# Patient Record
Sex: Female | Born: 1940
Health system: Southern US, Community
[De-identification: ages and names within clinical notes are randomized; demographics above are authoritative.]

## PROBLEM LIST (undated history)

## (undated) DIAGNOSIS — C801 Malignant (primary) neoplasm, unspecified: Secondary | ICD-10-CM

## (undated) DIAGNOSIS — Z91148 Patient's other noncompliance with medication regimen for other reason: Secondary | ICD-10-CM

## (undated) DIAGNOSIS — I209 Angina pectoris, unspecified: Secondary | ICD-10-CM

## (undated) DIAGNOSIS — H269 Unspecified cataract: Secondary | ICD-10-CM

## (undated) DIAGNOSIS — F32A Depression, unspecified: Secondary | ICD-10-CM

## (undated) DIAGNOSIS — J1282 Pneumonia due to coronavirus disease 2019: Secondary | ICD-10-CM

## (undated) DIAGNOSIS — F329 Major depressive disorder, single episode, unspecified: Secondary | ICD-10-CM

## (undated) DIAGNOSIS — R3129 Other microscopic hematuria: Secondary | ICD-10-CM

## (undated) DIAGNOSIS — U071 COVID-19: Secondary | ICD-10-CM

## (undated) DIAGNOSIS — T8859XA Other complications of anesthesia, initial encounter: Secondary | ICD-10-CM

## (undated) DIAGNOSIS — E559 Vitamin D deficiency, unspecified: Secondary | ICD-10-CM

## (undated) DIAGNOSIS — F419 Anxiety disorder, unspecified: Secondary | ICD-10-CM

## (undated) DIAGNOSIS — I219 Acute myocardial infarction, unspecified: Secondary | ICD-10-CM

## (undated) DIAGNOSIS — R011 Cardiac murmur, unspecified: Secondary | ICD-10-CM

## (undated) DIAGNOSIS — E785 Hyperlipidemia, unspecified: Secondary | ICD-10-CM

## (undated) DIAGNOSIS — I10 Essential (primary) hypertension: Secondary | ICD-10-CM

## (undated) DIAGNOSIS — R7302 Impaired glucose tolerance (oral): Secondary | ICD-10-CM

## (undated) DIAGNOSIS — Z9114 Patient's other noncompliance with medication regimen: Secondary | ICD-10-CM

## (undated) DIAGNOSIS — Z9289 Personal history of other medical treatment: Secondary | ICD-10-CM

## (undated) DIAGNOSIS — C50919 Malignant neoplasm of unspecified site of unspecified female breast: Secondary | ICD-10-CM

## (undated) DIAGNOSIS — I2510 Atherosclerotic heart disease of native coronary artery without angina pectoris: Secondary | ICD-10-CM

## (undated) DIAGNOSIS — T4145XA Adverse effect of unspecified anesthetic, initial encounter: Secondary | ICD-10-CM

## (undated) DIAGNOSIS — R000 Tachycardia, unspecified: Secondary | ICD-10-CM

## (undated) DIAGNOSIS — L409 Psoriasis, unspecified: Secondary | ICD-10-CM

## (undated) HISTORY — DX: Hyperlipidemia, unspecified: E78.5

## (undated) HISTORY — DX: Major depressive disorder, single episode, unspecified: F32.9

## (undated) HISTORY — DX: Patient's other noncompliance with medication regimen for other reason: Z91.148

## (undated) HISTORY — PX: APPENDECTOMY: SHX54

## (undated) HISTORY — DX: Essential (primary) hypertension: I10

## (undated) HISTORY — DX: Patient's other noncompliance with medication regimen: Z91.14

## (undated) HISTORY — DX: Tachycardia, unspecified: R00.0

## (undated) HISTORY — DX: Malignant neoplasm of unspecified site of unspecified female breast: C50.919

## (undated) HISTORY — DX: Vitamin D deficiency, unspecified: E55.9

## (undated) HISTORY — PX: BREAST BIOPSY: SHX20

## (undated) HISTORY — DX: Anxiety disorder, unspecified: F41.9

## (undated) HISTORY — PX: OTHER SURGICAL HISTORY: SHX169

## (undated) HISTORY — PX: CORONARY STENT PLACEMENT: SHX1402

## (undated) HISTORY — DX: Acute myocardial infarction, unspecified: I21.9

## (undated) HISTORY — DX: Unspecified cataract: H26.9

## (undated) HISTORY — DX: Personal history of other medical treatment: Z92.89

## (undated) HISTORY — DX: Atherosclerotic heart disease of native coronary artery without angina pectoris: I25.10

## (undated) HISTORY — PX: EYE SURGERY: SHX253

## (undated) HISTORY — DX: Psoriasis, unspecified: L40.9

## (undated) HISTORY — DX: Impaired glucose tolerance (oral): R73.02

## (undated) HISTORY — DX: Other microscopic hematuria: R31.29

## (undated) HISTORY — PX: ABDOMINAL HYSTERECTOMY: SHX81

## (undated) HISTORY — DX: Depression, unspecified: F32.A

## (undated) HISTORY — DX: Cardiac murmur, unspecified: R01.1

## (undated) HISTORY — PX: HEMORRHOID SURGERY: SHX153

## (undated) HISTORY — PX: TONSILLECTOMY AND ADENOIDECTOMY: SUR1326

---

## 1974-11-29 HISTORY — PX: RECTAL PROLAPSE REPAIR: SHX759

## 1998-05-14 ENCOUNTER — Other Ambulatory Visit: Admission: RE | Admit: 1998-05-14 | Discharge: 1998-05-14 | Payer: Self-pay | Admitting: *Deleted

## 1999-05-04 ENCOUNTER — Other Ambulatory Visit: Admission: RE | Admit: 1999-05-04 | Discharge: 1999-05-04 | Payer: Self-pay | Admitting: *Deleted

## 2000-11-25 ENCOUNTER — Other Ambulatory Visit: Admission: RE | Admit: 2000-11-25 | Discharge: 2000-11-25 | Payer: Self-pay | Admitting: *Deleted

## 2004-02-19 ENCOUNTER — Ambulatory Visit (HOSPITAL_COMMUNITY): Admission: RE | Admit: 2004-02-19 | Discharge: 2004-02-19 | Payer: Self-pay | Admitting: *Deleted

## 2005-03-06 ENCOUNTER — Ambulatory Visit: Payer: Self-pay | Admitting: Internal Medicine

## 2005-03-07 ENCOUNTER — Inpatient Hospital Stay (HOSPITAL_COMMUNITY): Admission: EM | Admit: 2005-03-07 | Discharge: 2005-03-09 | Payer: Self-pay | Admitting: Emergency Medicine

## 2005-03-16 ENCOUNTER — Ambulatory Visit: Payer: Self-pay | Admitting: Internal Medicine

## 2005-03-29 ENCOUNTER — Encounter (HOSPITAL_COMMUNITY): Admission: RE | Admit: 2005-03-29 | Discharge: 2005-06-27 | Payer: Self-pay | Admitting: Internal Medicine

## 2005-04-20 ENCOUNTER — Ambulatory Visit: Payer: Self-pay | Admitting: Internal Medicine

## 2005-05-03 ENCOUNTER — Ambulatory Visit: Payer: Self-pay | Admitting: Internal Medicine

## 2005-05-11 ENCOUNTER — Ambulatory Visit: Payer: Self-pay | Admitting: Internal Medicine

## 2005-06-28 ENCOUNTER — Encounter (HOSPITAL_COMMUNITY): Admission: RE | Admit: 2005-06-28 | Discharge: 2005-09-26 | Payer: Self-pay | Admitting: Internal Medicine

## 2005-08-17 ENCOUNTER — Ambulatory Visit: Payer: Self-pay | Admitting: Internal Medicine

## 2006-02-14 ENCOUNTER — Ambulatory Visit: Payer: Self-pay | Admitting: Internal Medicine

## 2006-02-21 ENCOUNTER — Ambulatory Visit: Payer: Self-pay | Admitting: Internal Medicine

## 2006-04-12 ENCOUNTER — Ambulatory Visit: Payer: Self-pay | Admitting: Internal Medicine

## 2006-04-18 ENCOUNTER — Ambulatory Visit: Payer: Self-pay | Admitting: Internal Medicine

## 2006-10-04 ENCOUNTER — Ambulatory Visit: Payer: Self-pay | Admitting: Internal Medicine

## 2007-03-30 ENCOUNTER — Other Ambulatory Visit: Admission: RE | Admit: 2007-03-30 | Discharge: 2007-03-30 | Payer: Self-pay | Admitting: Obstetrics & Gynecology

## 2007-05-26 ENCOUNTER — Ambulatory Visit: Payer: Self-pay | Admitting: Internal Medicine

## 2007-06-01 ENCOUNTER — Ambulatory Visit: Payer: Self-pay | Admitting: Internal Medicine

## 2007-06-01 LAB — CONVERTED CEMR LAB
AST: 17 units/L (ref 0–37)
Albumin: 3.7 g/dL (ref 3.5–5.2)
Alkaline Phosphatase: 57 units/L (ref 39–117)
Bilirubin, Direct: 0.1 mg/dL (ref 0.0–0.3)
Calcium: 9.7 mg/dL (ref 8.4–10.5)
Chloride: 105 meq/L (ref 96–112)
GFR calc non Af Amer: 66 mL/min
LDL Cholesterol: 83 mg/dL (ref 0–99)
Total CHOL/HDL Ratio: 2.4
Total Protein: 7 g/dL (ref 6.0–8.3)
Triglycerides: 48 mg/dL (ref 0–149)

## 2007-07-28 ENCOUNTER — Ambulatory Visit: Payer: Self-pay | Admitting: Internal Medicine

## 2007-09-08 ENCOUNTER — Ambulatory Visit: Payer: Self-pay | Admitting: Internal Medicine

## 2007-09-08 LAB — CONVERTED CEMR LAB
ALT: 26 units/L (ref 0–35)
AST: 21 units/L (ref 0–37)
Alkaline Phosphatase: 62 units/L (ref 39–117)
Bilirubin, Direct: 0.1 mg/dL (ref 0.0–0.3)
CO2: 32 meq/L (ref 19–32)
CRP, High Sensitivity: 1 (ref 0.00–5.00)
Calcium: 9.5 mg/dL (ref 8.4–10.5)
Cholesterol: 201 mg/dL (ref 0–200)
GFR calc Af Amer: 92 mL/min
Potassium: 3.7 meq/L (ref 3.5–5.1)
Total CHOL/HDL Ratio: 3.1
Total Protein: 7 g/dL (ref 6.0–8.3)
Triglycerides: 75 mg/dL (ref 0–149)

## 2008-01-17 ENCOUNTER — Ambulatory Visit: Payer: Self-pay | Admitting: Internal Medicine

## 2008-01-19 ENCOUNTER — Ambulatory Visit: Payer: Self-pay | Admitting: Internal Medicine

## 2008-01-19 LAB — CONVERTED CEMR LAB
ALT: 19 units/L (ref 0–35)
AST: 14 units/L (ref 0–37)
Alkaline Phosphatase: 60 units/L (ref 39–117)
CO2: 32 meq/L (ref 19–32)
Chloride: 104 meq/L (ref 96–112)
Creatinine, Ser: 1 mg/dL (ref 0.4–1.2)
Potassium: 3.7 meq/L (ref 3.5–5.1)
Sodium: 141 meq/L (ref 135–145)
Total Protein: 7.1 g/dL (ref 6.0–8.3)
VLDL: 14 mg/dL (ref 0–40)

## 2008-09-10 ENCOUNTER — Ambulatory Visit: Payer: Self-pay | Admitting: Internal Medicine

## 2008-09-11 ENCOUNTER — Ambulatory Visit: Payer: Self-pay

## 2008-11-12 ENCOUNTER — Ambulatory Visit: Payer: Self-pay | Admitting: Internal Medicine

## 2009-02-09 DIAGNOSIS — I2510 Atherosclerotic heart disease of native coronary artery without angina pectoris: Secondary | ICD-10-CM | POA: Insufficient documentation

## 2009-02-09 DIAGNOSIS — I10 Essential (primary) hypertension: Secondary | ICD-10-CM | POA: Insufficient documentation

## 2009-02-09 DIAGNOSIS — E785 Hyperlipidemia, unspecified: Secondary | ICD-10-CM | POA: Insufficient documentation

## 2009-02-09 DIAGNOSIS — I119 Hypertensive heart disease without heart failure: Secondary | ICD-10-CM | POA: Insufficient documentation

## 2009-02-10 ENCOUNTER — Encounter (INDEPENDENT_AMBULATORY_CARE_PROVIDER_SITE_OTHER): Payer: Self-pay | Admitting: *Deleted

## 2009-02-11 ENCOUNTER — Encounter: Payer: Self-pay | Admitting: Internal Medicine

## 2009-02-11 ENCOUNTER — Ambulatory Visit: Payer: Self-pay | Admitting: Internal Medicine

## 2009-02-11 DIAGNOSIS — M25519 Pain in unspecified shoulder: Secondary | ICD-10-CM | POA: Insufficient documentation

## 2009-02-13 ENCOUNTER — Ambulatory Visit: Payer: Self-pay

## 2009-05-15 ENCOUNTER — Ambulatory Visit: Payer: Self-pay | Admitting: Internal Medicine

## 2009-06-04 ENCOUNTER — Ambulatory Visit: Payer: Self-pay | Admitting: Internal Medicine

## 2009-06-05 LAB — CONVERTED CEMR LAB
ALT: 21 units/L (ref 0–35)
AST: 18 units/L (ref 0–37)
Alkaline Phosphatase: 59 units/L (ref 39–117)
Bilirubin, Direct: 0.1 mg/dL (ref 0.0–0.3)
Cholesterol: 166 mg/dL (ref 0–200)
HDL: 68.7 mg/dL (ref >39.00)
LDL Cholesterol: 89 mg/dL (ref 0–99)
Sodium: 142 meq/L (ref 135–145)
Total Bilirubin: 0.8 mg/dL (ref 0.3–1.2)
Total CHOL/HDL Ratio: 2
Total Protein: 6.5 g/dL (ref 6.0–8.3)
VLDL: 8.6 mg/dL (ref 0.0–40.0)

## 2009-11-06 ENCOUNTER — Ambulatory Visit: Payer: Self-pay | Admitting: Internal Medicine

## 2010-05-01 ENCOUNTER — Ambulatory Visit: Payer: Self-pay | Admitting: Internal Medicine

## 2010-09-17 ENCOUNTER — Encounter: Payer: Self-pay | Admitting: Physician Assistant

## 2010-09-27 ENCOUNTER — Encounter: Payer: Self-pay | Admitting: Physician Assistant

## 2010-09-28 ENCOUNTER — Ambulatory Visit: Payer: Self-pay | Admitting: Physician Assistant

## 2010-12-28 ENCOUNTER — Ambulatory Visit: Admit: 2010-12-28 | Payer: Self-pay | Admitting: Internal Medicine

## 2010-12-29 NOTE — Assessment & Plan Note (Signed)
Summary: Z6X  Medications Added AMLODIPINE BESYLATE 5 MG  TABS (AMLODIPINE BESYLATE) 1/2 by mouth every day RED YEAST RICE 600 MG CAPS (RED YEAST RICE EXTRACT) 2 tablets once daily      Allergies Added: NKDA  Visit Type:  Follow-up Primary Erika Brown:  Erika Brown  CC:  BP to low?[name]  History of Present Illness: Erika Brown is a very pleasant 70 year old woman with history of coronary artery disease, hypertension, hyperlipidemia with intolerance to all cholesterol medications and anxiety. She did had a Myoview in Oct 2009 which showed a small area of reversible ischemia in the inferior wall but we've been treating this medically.  Since her last visit with Dr. Gala Romney in June, she has developed fatigue.  She brings in a list of her BPs.  Her readings are in the 90s systolically at times.  She feels poorly with this.  Her PCP decreased her amlodipine recently at her CPE.  She feels some better with this but the change was done just one week ago.  Her LDL was 100 on her blood work.  She had a normal CBC, BMET and LFTs.  She  denies chest pain, dyspnea, orthopnea, PND, pedal edema or syncope.  The patient describes NYHA Class1-2 symptoms.  Current Medications (verified): 1)  Amlodipine Besylate 5 Mg  Tabs (Amlodipine Besylate) .... 1/2 By Mouth Every Day 2)  Metoprolol Succinate 50 Mg Xr24h-Tab (Metoprolol Succinate) .[name].. 1 Once Daily 3)  Diovan Hct 320-25 Mg Tabs (Valsartan-Hydrochlorothiazide) .... Take 1 Tab Daily 4)  Potassium Chloride Crys Cr 20 Meq Cr-Tabs (Potassium Chloride Crys Cr) .... Take One Tablet By Mouth Daily 5)  Plavix 75 Mg Tabs (Clopidogrel Bisulfate) .... Take 1 Tab By Mouth Daily 6)  Aspirin 81 Mg  Tbec (Aspirin) .... One By Mouth Every Day 7)  Vitamin D 09604 Unit  Caps (Ergocalciferol) .[name].. 1 By Mouth Weekly 8)  Nitrostat 0.4 Mg Subl (Nitroglycerin) .[name].. 1 Tablet Under Tongue At Onset of Chest Pain; You May Repeat Every 5 Minutes For Up To 3 Doses. 9)  Proctozone-Hc 2.5 % Crea  (Hydrocortisone) .... Prn 10)  Red Yeast Rice 600 Mg Caps (Red Yeast Rice Extract) .... 2 Tablets Once Daily  Allergies (verified): No Known Drug Allergies  Past History:  Past Medical History: Reviewed history from 02/09/2009 and no changes required.  1.  CAD     a. s/p NSTEMI 4/06. EF 60%     b. treated with Cypher DES to LAD and RCA with kissing balloon PCI diagonal     c. Treadmill Myoview 10/09 for CP: Walked 7:06. mild ST-T-wave changes in recovery.           EF 71%.  Very mild reversible defect in base of the inferior wall. -> medical rx  2. Hypertension with possible white-coat component  3. Hyperlipidemia     a. inolerant to multiple statins due to severe myalgias  4. Anxiety/depression  5. Glucose intolerance  6. Medication noncompliance  7. Seafood/shellfish allergy       Review of Systems       As per  the HPI.  All other systems reviewed and negative.   Vital Signs:  Patient profile:   70 year old female Height:      65 inches Weight:      146 pounds BMI:     24.38 Pulse rate:   79 / minute BP sitting:   135 / 80  (right arm) Cuff size:   regular  Vitals Entered By: Roanna Raider  Bascom Levels, RMA (September 28, 2010 3:51 PM)  Serial Vital Signs/Assessments:  Time      Position  BP       Pulse  Resp  Temp     By 4:53 PM             126/82                         Tereso Newcomer PA-C                                              Physical Exam  General:  Well nourished, well developed, in no acute distress HEENT: normal Neck: no JVD Cardiac:  normal S1, S2; RRR; no murmur Lungs:  clear to auscultation bilaterally, no wheezing, rhonchi or rales Abd: soft, nontender, no hepatomegaly Ext: no edema Vascular: no carotid  bruits Skin: warm and dry    Impression & Recommendations:  Problem # 1:  CAD, NATIVE VESSEL (ICD-414.01) No symptoms of angina. Continue current regimen. Follow up in 6 mos.  Problem # 2:  HYPERTENSION, BENIGN (ICD-401.1) Controlled.  Her BP  by me is optimal.  She was symptomatic with lower BPs and feels better with her new medical regimen.  Problem # 3:  HYPERLIPIDEMIA-MIXED (ICD-272.4) Intolerant to multiple statins.  Using red yeast rice.  She will have a f/u FLP and LFTs in 3 mos.  Patient Instructions: 1)  Your physician recommends that you schedule a follow-up appointment in: 6 months with Dr. Gala Romney.The office will mail you a reminder letter 2 months prior appointment date. 2)  Your physician recommends that you return for a FASTING lipid profile: Fasting lipid panel and LFT in 3 months. Pt. has an appointment for labs on 12/28/10. Pt. aware.

## 2010-12-29 NOTE — Progress Notes (Signed)
Summary: [facility]als   Patients at Home Vitals   Imported By: Roderic Ovens 10/05/2010 11:48:52  _____________________________________________________________________  External Attachment:    Type:   Image     Comment:   External Document

## 2010-12-29 NOTE — Assessment & Plan Note (Signed)
Summary: f49m      Allergies Added: NKDA   Primary Provider:  [name]   History of Present Illness: Erika Brown is a very pleasant 70 year old woman with history of coronary artery disease, hypertension, hyperlipidemia with intolerance to all cholesterol medications and anxiety. She did had a Myoview recently which showed a small area of reversible ischemia in the inferior wall but we've been treating this medically.  She returns today for followup. Doing great! Playing tennis avidly. Very good exercise tolerance. endurance much improved. No CP or SOB. BP well controlled. Taking red yeast rice most days.   Current Medications (verified): 1)  Amlodipine Besylate 5 Mg  Tabs (Amlodipine Besylate) .[name].. 1 By Mouth Every Day 2)  Metoprolol Succinate 50 Mg Xr24h-Tab (Metoprolol Succinate) .[name].. 1 Once Daily 3)  Diovan Hct 320-25 Mg Tabs (Valsartan-Hydrochlorothiazide) .... Take 1 Tab Daily 4)  Potassium Chloride Crys Cr 20 Meq Cr-Tabs (Potassium Chloride Crys Cr) .... Take One Tablet By Mouth Daily 5)  Plavix 75 Mg Tabs (Clopidogrel Bisulfate) .... Take 1 Tab By Mouth Daily 6)  Aspirin 81 Mg  Tbec (Aspirin) .... One By Mouth Every Day 7)  Vitamin D 13086 Unit  Caps (Ergocalciferol) .[name].. 1 By Mouth Weekly 8)  Nitrostat 0.4 Mg Subl (Nitroglycerin) .[name].. 1 Tablet Under Tongue At Onset of Chest Pain; You May Repeat Every 5 Minutes For Up To 3 Doses. 9)  Proctozone-Hc 2.5 % Crea (Hydrocortisone) .... Prn 10)  Red Yeast Rice 600 Mg Caps (Red Yeast Rice Extract) .... 3 Tablets Once Daily (Out Getting More)  Allergies (verified): No Known Drug Allergies  Past History:  Past Medical History: Last updated: 02/09/2009  1.  CAD     a. s/p NSTEMI 4/06. EF 60%     b. treated with Cypher DES to LAD and RCA with kissing balloon PCI diagonal     c. Treadmill Myoview 10/09 for CP: Walked 7:06. mild ST-T-wave changes in recovery.           EF 71%.  Very mild reversible defect in base of the inferior wall. ->  medical rx  2. Hypertension with possible white-coat component  3. Hyperlipidemia     a. inolerant to multiple statins due to severe myalgias  4. Anxiety/depression  5. Glucose intolerance  6. Medication noncompliance  7. Seafood/shellfish allergy       Review of Systems       As per HPI and past medical history; otherwise all systems negative.   Vital Signs:  Patient profile:   70 year old female Height:      65 inches Weight:      150 pounds BMI:     25.05 Pulse rate:   74 / minute Resp:     16 per minute BP sitting:   128 / 78  (right arm)  Vitals Entered By: Marrion Coy, CNA (May 01, 2010 4:16 PM)  Physical Exam  General:  Gen: well appearing. looks younger than stated age no resp difficulty HEENT: normal Neck: supple. no JVD. Carotids 2+ bilat; no bruits.  Cor: PMI nondisplaced. Regular rate & rhythm. No rubs, gallops, murmur. Lungs: clear Abdomen: soft, nontender, nondistended. No hepatosplenomegaly. No bruits or masses. Good bowel sounds. Extremities: no cyanosis, clubbing, rash, edema.  Neuro: alert & orientedx3, cranial nerves grossly intact. moves all 4 extremities w/o difficulty. affect pleasant    Impression & Recommendations:  Problem # 1:  CAD, NATIVE VESSEL (ICD-414.01) Stable. No evidence of ischemia. She is doing great with  her tennis. Continue current regimen.  Problem # 2:  HYPERTENSION, BENIGN (ICD-401.1) Blood pressure looks great!  Continue current regimen.  Other Orders: EKG w/ Interpretation (93000)  Patient Instructions: 1)  Your physician wants you to follow-up in:  6 months.  You will receive a reminder letter in the mail two months in advance. If you don't receive a letter, please call our office to schedule the follow-up appointment.

## 2011-04-13 NOTE — Assessment & Plan Note (Signed)
[facility]                            CARDIOLOGY OFFICE NOTE   MIARA, EMMINGER                      MRN:          [id number]  DATE:07/28/2007                            DOB:          1941-03-07    PRIMARY CARE PHYSICIAN:  Geoffry Paradise, M.D.   INTERVAL HISTORY:  Erika Brown is a very pleasant 70 year old woman with a  history of coronary artery disease status post previous non-ST-elevation  myocardial infarction in April 2007, treated with PTCA and stenting of  the proximal right coronary artery and mid LAD with CYPHER drug-eluting  stents.  EF is 50%.  She also has a history of hypertension,  hyperlipidemia, and borderline glucose intolerance.   She returns today for routine followup.  Overall, she is doing fairly  well, though she is not exercising like she used to.  She denies any  chest pain or shortness of breath.  She has stopped her Lipitor because  she said her cholesterol looks okay and she is worried about side  effects.  She has been doing her best to control her lipids by diet  alone.   CURRENT MEDICATIONS:  1. Aspirin 81.  2. Plavix 75.  3. Potassium 10 a day.  4. Diovan/hydrochlorothiazide 320/25.  5. Toprol 50 a day.  6. Amlodipine 5 a day.   PHYSICAL EXAM:  She is well-appearing, in no acute distress.  Ambulates  around the clinic without any respiratory difficulty.  Blood pressure here is 138/82, though she brings in a log of her blood  pressures and systolics have been running in about the 110-120 range.  Heart rate is 65, weight is 151.  HEENT:  Normal, except for some mild xanthelasma.  NECK:  Supple.  No JVD.  Carotids are 2+ bilaterally without any bruits.  There is no lymphadenopathy or thyromegaly.  CARDIAC:  Regular rate and rhythm.  No murmurs, rubs, or gallops.  PMI  is not displaced.  LUNGS:  Clear.  ABDOMEN:  Soft, nontender, nondistended.  No hepatosplenomegaly.  No  bruits.  No masses.  Good bowel  sounds.  EXTREMITIES:  Warm with no cyanosis, clubbing, or edema.  No rash.  NEURO:  Alert and oriented x3.  Cranial nerves 2-12 are intact.  Moves  all 4 extremities without difficulty.  Affect is pleasant.  Most recent  cholesterol panel shows a total cholesterol 158, triglycerides 48, HDL  66, and LDL 83.   ASSESSMENT AND PLAN:  1. Coronary artery disease status post previous myocardial infarction      with drug-eluting stents.  She is stable.  Continue Plavix.  No      evidence of ischemia.  I did remind her to exercise more.  2. Hypertension.  She has a significant component of white coat      hypertension, but her blood pressure at home is ding quite well and      we will continue her current regimen.  3. Hyperlipidemia.  We had a very long talk about needing to get her      LDL down to below 70, and also the  additional benefits of a Statin.      At this point, she remains reluctant to start a Statin.  I did      advise her of the risk reduction potential and left the decision to      her.  We will get a CRT as well as a repeat lipid panel in several      months to see how she is doing and get an idea of her overall risk.   DISPOSITION:  Return to clinic in 6 months for a routine followup.     Bevelyn Buckles. Bensimhon, MD  Electronically Signed    DRB/MedQ  DD: 07/28/2007  DT: 07/29/2007  Job #: [id number]   cc:   Geoffry Paradise, M.D.

## 2011-04-13 NOTE — Assessment & Plan Note (Signed)
[facility]                            CARDIOLOGY OFFICE NOTE   Erika Brown, Erika Brown                      MRN:          [id number]  DATE:09/10/2008                            DOB:          05-25-1941    PRIMARY CARE PHYSICIAN:  Geoffry Paradise, MD   INTERVAL HISTORY:  Isip is a very pleasant 70 year old woman with a  history of coronary artery disease status post non-ST-elevation  myocardial infarction April 2007, treated with stenting of the proximal  right coronary artery and mid LAD with Cypher drug-eluting stent.  EF  was 50%.  She also has a history of hypertension, hyperlipidemia,  borderline glucose intolerance, and anxiety.  She has been intolerant of  statins before due to severe muscle aches.  She also has a history of  medication noncompliance.   She returns today at the request of Dr. [name].  He saw her 2 days ago  and she describes several episodes of chest tightness with activity  including playing singles tennis and walking uphill with her daughter.  She does not get this discomfort with low levels of activity.  The  discomfort resolves quickly when she sits down to rest.  She has not had  any nighttime angina.  No heart failure symptoms.   Given her intolerance of medications, she is switched over to taking all  her medications every other day.  She has been doing this for the past  year.  Dr. [name] saw her and is very concerned about her chest pain.  I asked her to follow up with [place].  He also went ahead and started her on  Crestor 5 mg every other day to see if she would tolerate.   She is quite anxious in the office today.  She has been taking her blood  pressures at home and these have been relatively well-controlled with  systolics in the 120 range.  She does have a history of white coat  hypertension.   CURRENT MEDICATIONS:  1. Crestor 5 mg every other day.  2. Aspirin 81 every other day.  3. Plavix 75 daily.  4.  Potassium 10 daily.  5. Diovan/HCTZ 325/25 daily.  6. Toprol 50 mg daily.  7. Amlodipine 5 mg daily.   PHYSICAL EXAMINATION:  GENERAL:  She is mildly anxious and in no acute  distress, ambulates around the clinic without any respiratory  difficulty.  VITAL SIGNS:  Blood pressure initially was 160/90, on my recheck it was  174/94, heart rate was 74, weight was 149 which is stable.  HEENT:  Normal.  NECK:  Supple.  There is no JVD.  Carotids are 2+ bilaterally without  bruits.  There is no lymphadenopathy or thyromegaly.  CARDIAC:  PMI is  nondisplaced.  She has regular rate and rhythm with no murmurs, rubs or  gallops.  LUNGS:  Clear.  ABDOMEN:  Soft, nontender, nondistended, no hepatosplenomegaly, no  bruits, no masses.  Good bowel sounds.  EXTREMITIES:  Warm with no cyanosis, clubbing, or edema.  No rash.  NEURO:  Alert and oriented x3, cranial nerves II through XII  are intact,  moves all 4 extremities difficulty.   EKG shows normal sinus rhythm at a rate of 74.  She has small inferior Q-  waves which are new since her previous EKG last year.  She has no  significant ST-T wave abnormalities.   ASSESSMENT AND PLAN:  1. Chest pain.  This is very concerning to me for her chronic angina.      It has not been progressive.  Her EKG has changed and looks like      she may have suffered a silent inferior myocardial infarction.  We      discussed the risk and indication of cardiac catheterization and      stress testing.  At this point, she has chosen stress testing.  We      will proceed with a treadmill Myoview.  I would have a fairly low      threshold to proceed with catheterization if there is a significant      amount of ischemia.  I did discuss with her the possibility of      adding Imdur, but she does not want to do this at this point.  I      also talked to her about trying to be more compliant with her      medications on a daily basis.  2. Chronic hypertension.  I suspect  this is mostly white coat      hypertension followed by Dr. [name].  3. Hyperlipidemia.  Goal LDL is less than 70.  She is recently been      started on a statin,  I  hope she will be able to tolerate this.   DISPOSITION:  Pending results of her stress test.     Bevelyn Buckles. Bensimhon, MD  Electronically Signed    DRB/MedQ  DD: 09/10/2008  DT: 09/11/2008  Job #: [id number]   cc:   Geoffry Paradise, M.D.

## 2011-04-13 NOTE — Assessment & Plan Note (Signed)
[facility]                            CARDIOLOGY OFFICE NOTE   Erika Brown, Erika Brown                      MRN:          [id number]  DATE:11/12/2008                            DOB:          Jun 16, 1941    PRIMARY CARE PHYSICIAN:  Geoffry Paradise, MD   INTERVAL HISTORY:  Erika Brown is a very pleasant 70 year old woman with  history of coronary artery disease status post non-ST-elevation  myocardial infarction in April 2007 treated with stenting of the  proximal right coronary artery in the LAD with Cypher drug-eluting  stents.  EF was 50%.  She also has a history of hypertension,  hyperlipidemia, borderline glucose intolerance, and anxiety.  She has  been intolerant of multiple statins due to severe muscle aches.  She  also has a history of medication noncompliance.   The last visit, she had some exertional chest pain.  We discussed the  possibility catheterization versus Myoview.  She underwent a treadmill  Myoview.  She walked 7 minutes and 6 seconds with mild ST-T-wave changes  in recovery.  EF was 71%.  There was a very mild reversible defect in  the base of the inferior wall.  We chose to manage her medically.   She has been playing tennis very avidly.  She has been active with no  chest pain or shortness of breath.  She says she feels great.  She is  very happy with her tennis.  She has been very compliant with her  medications as well.  She is back to taking them every day instead of  every other day.  She did try Crestor, but was unable to tolerate this  once again due to myalgias.   CURRENT MEDICATIONS:  1. Amlodipine 5 mg a day.  2. Metoprolol 50 mg a day.  3. Diovan hydrochlorothiazide 320/25 a day.  4. Potassium 10 a day.  5. Plavix 75 a day.  6. Aspirin 81 a day.  7. Vitamin D.   ALLERGIES/INTOLERANCES:  All STATIN DRUGS and IODINE.   PHYSICAL EXAMINATION:  GENERAL:  She is well-appearing in no acute  distress, ambulates around the  clinic without any respiratory  difficulty.  VITAL SIGNS:  Blood pressure is 122/76, heart rate is 90, weight is 149.  HEENT:  Normal.  NECK:  Supple.  No JVD.  Carotids are 2+ bilaterally without any bruits.  There is no lymphadenopathy or thyromegaly.  CARDIAC:  PMI is nondisplaced.  She has irregular rate and rhythm.  No  murmurs, rubs, or gallops.  LUNGS:  Clear.  ABDOMEN:  Soft, nontender, nondistended, no hepatosplenomegaly, no  bruits, no masses.  EXTREMITIES:  Warm with no cyanosis or clubbing.  She does have a  contusion on her right shin with some surrounding swelling.  Otherwise,  no edema.  NEURO:  Alert and oriented x3.  Cranial nerves II through XII are  intact.  Moves all 4 extremities without difficulty.  Affect is very  pleasant.   Review of her home blood pressures shows good control with systolics  ranging from 114-140 with the average in low-to-mid 120s.  ASSESSMENT AND PLAN:  1. Coronary artery disease with recent mildly positive stress test.      She has done great with medical therapy and exercise.  I would      continue this.  2. Hypertension is beautiful today.  I have congratulated her on her      compliance.  She is really doing well.  3. Hyperlipidemia.  She has been intolerant of all statins.  I      suggested her that she try red yeast rice and see how that goes.      She is going to try a 600-mg tablet and see if we can work all the      way up to 2 or 3 tablets a day.   DISPOSITION:  We will see her back in clinic in 4-5 months for routine  followup.     Bevelyn Buckles. Bensimhon, MD  Electronically Signed    DRB/MedQ  DD: 11/12/2008  DT: 11/13/2008  Job #: [id number]   cc:   Geoffry Paradise, M.D.

## 2011-04-13 NOTE — Assessment & Plan Note (Signed)
[facility]                            CARDIOLOGY OFFICE NOTE   Erika Brown, Erika Brown                      MRN:          [id number]  DATE:05/26/2007                            DOB:          Sep 22, 1941    REFERRING PHYSICIAN:  Geoffry Paradise, M.D.   INTERVAL HISTORY:  Erika Brown is a very pleasant 70 year old woman with a  history of coronary artery disease status post previous non-ST elevation  myocardial infarction in April 2007 treated PTCA and stenting of the  proximal right coronary artery and mid LAD with Cypher drug-eluting  stents.  EF was 50%.  She also has history of hypertension,  hyperlipidemia, and borderline glucose intolerance.  She returns today  for routine followup.   She denies any angina or shortness of breath.  She was previously fairly  active playing tennis and walking, but stopped this due to shin splints.  She has had some mild swelling of her ankles, but no orthopnea or PND.   Unfortunately, she stopped taking her Lipitor because she said it just  made her nervous about the side effects.   CURRENT MEDICATIONS:  1. Aspirin 81 a day.  2. Plavix 75 a day.  3. Potassium 10 a day.  4. Diovan/hydrochlorothiazide 320/25 a day.  5. Toprol 50 mg a day.   ALLERGIES:  IODINE.   PHYSICAL EXAMINATION:  She is well appearing, in no acute distress.  Ambulates around the clinic without any respiratory difficulty.  Blood pressure initially 136/90, then 174/94.  Heart rate 62.  Weight is  153.  HEENT:  Normal.  NECK:  Supple.  No JVD.  Carotids are 2+ bilaterally without any bruits.  There is no lymphadenopathy or thyromegaly.  CARDIAC:  Regular rate and rhythm.  No murmurs, rubs, or gallops.  LUNGS:  Clear.  ABDOMEN:  Soft and non-tender.  Non-distended.  There is no  hepatosplenomegaly.  No bruits.  No masses.  Good bowel sounds.  EXTREMITIES:  Warm with no cyanosis or clubbing.  There is trivial edema  around her ankles.  No  rash.  NEUROLOGIC:  Alert and oriented x3.  Cranial nerves 2 through 12 are  intact.  Moves all 4 extremities without difficulty.  Affect is  pleasant.   EKG shows normal sinus rhythm at a rate of 73.  No ST-T wave  abnormalities.   ASSESSMENT AND PLAN:  1. Coronary artery disease.  This is quite stable without any events      of recurrent ischemia.  Continue current therapy.  2. Hypertension.  I suspect she may have a component of white coat      syndrome.  We will go ahead and start her on Norvasc 5 and continue      to follow.  3. Hyperlipidemia.  I had a long talk with her about the benefits of      statin therapy, and suggested that she restart her Lipitor.  She      said she would think about this.  We also discussed the possibility      of using Caduet for a combination  of Lipitor and Norvasc.  She is      somewhat reluctant at this point.   DISPOSITION:  We will see her back in several months for routine  followup.     Erika Buckles. Bensimhon, MD  Electronically Signed    DRB/MedQ  DD: 05/26/2007  DT: 05/26/2007  Job #: [id number]   cc:   Geoffry Paradise, M.D.

## 2011-04-13 NOTE — Assessment & Plan Note (Signed)
Erika Brown                            CARDIOLOGY OFFICE NOTE   Erika Brown, Erika Brown                      MRN:          [id number]  DATE:01/17/2008                            DOB:          [birth year]    PRIMARY CARE PHYSICIAN:  Dr. Geoffry Paradise.   INTERVAL HISTORY:  Erika Brown is a very pleasant 70 year old woman with a  history of coronary artery disease status post previous non-ST elevation  myocardial infarction April 2007 treated with PTCA and stenting of the  proximal right coronary artery and mid LAD with Cypher drug-eluting  stents.  EF 50.  She also has a history of hypertension, hyperlipidemia,  and borderline glucose intolerance.  She is intolerant of statins due to  muscle aches and refuses to take them.   She returns today for routine follow-up.  She is doing very well.  She  has been much more active.  She has lost some weight and feels great.  She denies any chest pain or shortness of breath.  She has been doing  well with her diet as well.   CURRENT MEDICATIONS:  1. Aspirin 81.  2. Plavix 75.  3. Potassium 10.  4. Diovan HCTZ 320/25.  5. Toprol 50 a day.  6. Amlodipine 5 a day.   PHYSICAL EXAMINATION:  GENERAL:  She is well-appearing in no acute  distress.  Ambulates around the clinic without respiratory difficulty.  VITAL SIGNS:  Blood pressure is 128/82, heart rate 79, weight 148.  HEENT:  Normal.  NECK:  Supple.  No JVD.  Carotids are 2+ bilaterally without bruits.  There is no lymphadenopathy or thyromegaly.  CARDIAC:  Regular rate and rhythm.  No murmurs, rubs or gallop.  PMI is  normal.  LUNGS:  Clear.  ABDOMEN:  Soft, nontender, nondistended.  No hepatosplenomegaly, no  bruits, no masses.  Good bowel sounds.  EXTREMITIES:  Warm with no  cyanosis, clubbing or edema.  No rash.  NEUROLOGICAL:  Alert and oriented x3.  Cranial nerves II-XII are intact.  Moves all four extremities without difficulty.  Affect is  pleasant.   EKG shows sinus rhythm at a rate of 79, no ST-T wave abnormalities.   ASSESSMENT/PLAN:  1. Coronary artery disease.  This is stable status post previous      myocardial infarction.  No evidence of ischemia.  Would continue      Plavix indefinitely.  2. Chronic hypertension.  Blood pressure looks great.  She did bring      me a log of her blood pressures from home, and these have been      doing very well with a mean systolic blood pressure about 322.  3. Chronic hyperlipidemia.  Once again we discussed the possibilities      of statin drugs, but she is against these due to myalgias.  We will      recheck her lipids if her LDL is above 70.  We will consider adding      Zetia.   DISPOSITION:  Return to clinic in nine months for routine follow-up.  Bevelyn Buckles. Bensimhon, MD  Electronically Signed    DRB/MedQ  DD: 01/17/2008  DT: 01/18/2008  Job #: [id number]   cc:   Geoffry Paradise, M.D.

## 2011-04-16 ENCOUNTER — Telehealth: Payer: Self-pay | Admitting: Internal Medicine

## 2011-04-16 NOTE — Cardiovascular Report (Signed)
NAMESHANELLE, CLONTZ               ACCOUNT NO.:  [id number]   MEDICAL RECORD NO.:  [id number]          PATIENT TYPE:  INP   LOCATION:  3737                         FACILITY:  [facility]   PHYSICIAN:  Arvilla Meres, M.D. LHCDATE OF BIRTH:  [birth year]   DATE OF PROCEDURE:  03/08/2005  DATE OF DISCHARGE:                              CARDIAC CATHETERIZATION   PRIMARY CARE PHYSICIAN:  Geoffry Paradise, M.D.   PROCEDURE:  1.  Selective coronary angiography.  2.  Left heart catheterization.  3.  Left ventriculogram.   CARDIOLOGIST:  Arvilla Meres, M.D.   INDICATIONS FOR PROCEDURE:  Ms. Mcdevitt is a very pleasant 70 year old female  with a history of hypertension, but no previously known coronary artery  disease, who was admitted with a non-ST elevation myocardial infarction, and  was thus referred for a cardiac catheterization.   DESCRIPTION OF PROCEDURE:  The risks and benefits of the catheterization  were explained to Ms. Timothy Lasso.  A consent was signed and placed on the chart.  The right groin area was prepped and draped in a routine sterile fashion and  subsequently was anesthetized with 1% local lidocaine.  A 6-French arterial  sheath was placed in the right femoral artery using a modified Seldinger  technique.  Standard pre-formed catheters, including a Judkins JL4, JR4 and  an angled pigtail were used for the procedure.  All catheter exchanges were  made over a wire.  There were no apparent complications.  At the end of the  procedure the sheath was sewn in and the patient was transferred back to the  holding area in stable condition, in anticipation of a coronary angioplasty.   FINDINGS:  Central aortic pressure:  107/60 with a mean of 79.  LV:  110/6 with an LVEDP of 15.  There was no gradient on pullback across  the aortic valve.   RESULTS:  1.  LEFT MAIN CORONARY ARTERY:  The left main coronary artery was normal.  2.  LEFT ANTERIOR DESCENDING CORONARY ARTERY:  The left  anterior descending      artery was a long vessel that wrapped the apex.  It gave off a very      large branching diagonal. It also gave off a large proximal septal      branch.  There was a 50% proximal LAD lesion just prior to the large      diagonal, and just after the diagonal there was a 75%-80% lesion in the      mid-LAD.  In the diagonal there was a 30%-40% lesion in the mid-section.      In the first septal, which was a long vessel, there was a 60% ostial      stenosis with diffuse distal disease.  3.  LEFT CIRCUMFLEX CORONARY ARTERY:  The left circumflex coronary artery      gave off a normal-sized ramus branch, a tiny OM-I and a large OM-II.      The distal AV groove circumflex was small.  There is a 50% ostial lesion      in the ramus, which was not flow-limiting.  Otherwise  the circumflex was      free of significant coronary disease.  4.  RIGHT CORONARY ARTERY:  The right coronary artery was a large vessel.      It was dominant.  It gave off a large PDA as well as a large PL.  There      is a 40% ostial stenosis, followed by a 99% focal stenosis just after      the conus branch.  There was a 40% lesion prior to the takeoff of the RV      branch.  There was no significant distal disease.   LEFT VENTRICULOGRAM:  The left ventriculogram showed an ejection fraction of  about 60% with no significant wall motion abnormalities or mitral  regurgitation.   ASSESSMENT/PLAN:  1.  Two-vessel coronary artery disease.  2.  Catheterization films will be reviewed with our interventionalist, Dr.      Salvadore Farber, for a possible percutaneous coronary intervention of      the right coronary artery and the left anterior descending coronary      artery.  We will continue with aggressive risk factor management.      DB/MEDQ  D:  03/08/2005  T:  03/08/2005  Job:  [id number]   cc:   Geoffry Paradise, M.D.  [address]  [place]  [place] [address]  Fax: [phone]

## 2011-04-16 NOTE — Assessment & Plan Note (Signed)
[facility]                              CARDIOLOGY OFFICE NOTE   NALDA, SHACKLEFORD                      MRN:          [id number]  DATE:10/04/2006                            DOB:          Aug 25, 1941    PRIMARY CARE PHYSICIAN:  Geoffry Paradise, M.D.   PATIENT IDENTIFICATION:  Erika Brown is a delightful 70 year old woman who  comes today for routine followup.   PROBLEM LIST:  1. Coronary artery disease:  Status post non-ST-elevation myocardial      infarction April 2006 with PTCA and stenting of the proximal right      coronary artery and mid LAD with Cypher drug-eluting stent; EF was 50%.  2. Hypertension.  3. Hyperlipidemia.  4. Diffuse muscle aches, not related to Lipitor.  5. Anemia.  6. Borderline glucose intolerance.   CURRENT MEDICATIONS:  1. Aspirin 81 a day.  2. Plavix 75 a day.  3. Lipitor 10 mg a day.  4. Diovan HCT 320/25.  5. K-Dur 10 mEq a day.  6. Toprol-XL 50 mg a day.   ALLERGIES:  IODINE.   INTERVAL HISTORY:  Ms. Lamison returns today for routine followup.  From a  cardiovascular point of view she is doing wonderfully.  She is playing  tennis twice a week and riding a recumbent exercise bike about 40 minutes a  day without any chest pain or shortness of breath.  She has not had any  heart failure symptoms.  Unfortunately, she has had quite a bit of family  stress in her life with two children sick with cancer, as well as her son-in-  law recently committing suicide.  Despite this, she does remain very  optimistic.   PHYSICAL EXAMINATION:  GENERAL:  She is very well-appearing, no acute  distress, ambulates without any respiratory difficulty.  VITAL SIGNS:  Blood pressure initially 126/72, on manual recheck 148/90.  Heart rate 78, weight 153.  HEENT:  Sclerae anicteric, EOMI.  There are no xanthelasmas.  Mucous  membranes are moist.  NECK:  Supple, no JVD.  Carotids are 2+ bilaterally without any bruits.  There is no  lymphadenopathy or thyromegaly.  CARDIAC:  Regular rate and rhythm.  No murmurs, rubs or gallops.  LUNGS:  Clear.  ABDOMEN:  Soft, nontender, nondistended.  No hepatosplenomegaly, no bruits,  no masses.  Bowel sounds are good.  EXTREMITIES:  Warm with no cyanosis, clubbing or edema.  There are no rashes  or arthropathies.  Distal pulses are strong.  NEUROLOGIC:  She is alert and oriented x3.  Cranial nerves II-XII are  intact.  Moves all four extremities without difficulty.  Affect is pleasant.   EKG shows normal sinus rhythm at a rate of 74.  No ST-T wave changes.   ASSESSMENT AND PLAN:  1. Coronary artery disease.  This is quite stable.  She is on a good      medical regimen.  We will continue her Plavix indefinitely.  2. Hypertension.  Her blood pressure in clinic today is mildly elevated      but at home it has been well  controlled.  We will continue to follow.      I asked her to keep a blood pressure log and to bring it with her at      the next visit.  3. Hyperlipidemia.  She is asking to switch from Lipitor to a generic      brand and we will switch her over to Zocor 40 mg a day.  We will check      cholesterol at the next visit as well as a liver panel.  4. Disposition.  Return to clinic in 6 months for routine followup.     Bevelyn Buckles. Bensimhon, MD  Electronically Signed    DRB/MedQ  DD: 10/04/2006  DT: 10/04/2006  Job #: [id number]   cc:   Geoffry Paradise, M.D.

## 2011-04-16 NOTE — H&P (Signed)
NAMECURSTIN, SCHMALE NO.:  [id number]   MEDICAL RECORD NO.:  [id number]          PATIENT TYPE:  EMS   LOCATION:  MAJO                         FACILITY:  [facility]   PHYSICIAN:  Arvilla Meres, M.D. LHCDATE OF BIRTH:  02/16/1941   DATE OF ADMISSION:  03/06/2005  DATE OF DISCHARGE:                                HISTORY & PHYSICAL   PRIMARY CARE PHYSICIAN:  Geoffry Paradise, M.D.   PATIENT IDENTIFICATION:  Erika Brown is a 70 year old woman with a history of  hypertension but no known coronary artery disease who is being admitted  through the ER with unstable angina.   She reports that she had some exertional left arm pain earlier in the week  but this was relatively brief and resolved.  Today, had several episodes of  stuttering chest tightness radiating to the left arm.  This occurred  throughout the day and it was worse with exertion.  This evening, after  dinner, she had worsening chest pain while climbing the stairs and it got  quite severe.  She called EMS.  By report on EMS arrival, she had a ST  depression initially on the monitor but unfortunately there was no tracing.  She was treated with nitroglycerin spray and had prompt relief of her chest  pain.  Her initial cardiac markers are negative.  EKG has just minimal ST  elevation in V2 and V3, however, this is nonspecific.  She is now pain free.   REVIEW OF SYSTEMS:  She did have some mild diarrhea this morning but denies  any fevers, chills, nausea, vomiting, cough, headache.  She has not had any  dysuria or reflux symptoms.  She denies any TIAs or claudication.   PAST MEDICAL HISTORY:  1.  Hypertension.  2.  Status post appendectomy.  3.  Status post vaginal hysterectomy.  4.  Status post bladder tack.  5.  History of seafood/shellfish allergy.   CURRENT MEDICATIONS:  Diovan/HCTZ 160/12.5.   ALLERGIES:  SEAFOOD/SHELLFISH.   SOCIAL HISTORY:  She lives in [place] with her husband.  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  She has seven kids.  She does not smoke.  She drinks alcohol socially.   FAMILY HISTORY:  Her father died at 71 from an MI.  There is no other  significant family history for coronary disease.   PHYSICAL EXAMINATION:  GENERAL:  She is comfortable, though mildly anxious.  HEENT:  Sclerae are anicteric.  EOMI.  NECK:  Supple.  There is no JVD.  Carotids are 2+ bilaterally without any  bruits.  There is no lymphadenopathy or thyromegaly.  There are no  __________  .  CARDIAC:  Shows a regular rate and rhythm with no murmurs, rubs or gallops.  LUNGS:  Clear to auscultation.  CHEST WALL:  There is no pain on palpation.  ABDOMEN:  Soft, nontender, nondistended.  There is no hepatosplenomegaly.  There are no bruits.  EXTREMITIES:  Warm with no cyanosis, clubbing, or edema.  Femoral pulses are  2+ bilaterally without bruits.  Distal pulses are strong.  LABS:  Shows a white count of 6.1, hematocrit of 36.1%, and platelets of  261.  Her MCV is 82.  INR is 1.0.  Sodium is 138, potassium is 3.2, chloride  is 107, bicarb is 25, BUN is 18, creatinine is 0.7, and glucose is 144.  CK-  MB is 1, second CK-MB is 1.4.  First troponin was less than 0.05, second  troponin point-of-care 0.05.  Chest x-ray shows no acute disease.  EKG shows  a normal sinus rhythm at 66 with minimal ST elevation in V2-V3 which is  nonspecific.   ASSESSMENT:  1.  Chest pain, likely unstable angina.  2.  Hypertension.  3.  Seafood allergy.  4.  Anemia, borderline with a mildly decreased MCV.   PLAN:  1.  Admit to telemetry for rule out MI.  2.  Treat with heparin, IV nitroglycerin, and beta-blocker.  If enzymes are      positive, add Integrilin.  She is currently refusing to be treated with      a Statin.  3.  She will need cardiac catheterization on Monday.  The risks, benefits      have been explained to her and orders are placed on the chart.  4.  Premedication  for dye allergy.  5.  Control BP.  6.  Workup anemia.  7.  Supplement low potassium.      DB/MEDQ  D:  03/07/2005  T:  03/07/2005  Job:  [id number]

## 2011-04-16 NOTE — Telephone Encounter (Signed)
Spoke w/pt, she is concerned b/c the past few days when she has checked her BP her monitor has alerted her that she has an irregular rhythm, HR 60-70s, no SOB, no palps, no other symptoms, she has had an increase in stress explained coulb be having PVCs, she is concerned she will come in on Mon for EKG

## 2011-04-16 NOTE — Cardiovascular Report (Signed)
Erika Brown, Erika Brown               ACCOUNT NO.:  [id number]   MEDICAL RECORD NO.:  [id number]          PATIENT TYPE:  INP   LOCATION:  3737                         FACILITY:  [facility]   PHYSICIAN:  Salvadore Farber, M.D. LHCDATE OF BIRTH:  1941/10/27   DATE OF PROCEDURE:  03/08/2005  DATE OF DISCHARGE:                              CARDIAC CATHETERIZATION   PROCEDURE:  Drug-eluting stent placement in the proximal RCA, drug-eluting  stent placement in the mid-LAD, with kissing balloon angioplasty of the  ostium of the first diagonal branch.   INDICATIONS:  Ms. Matchett is a 70 year old lady with hypertension who presents  with a small non-ST elevation myocardial infarction, troponin rising to  approximately 0.5.  She underwent diagnostic angiography by Dr. Gala Romney  this morning.  This demonstrated an 80% stenosis of the mid-LAD crossing the  takeoff of the first diagonal.  By electrocardiogram, this appeared to be  the culprit lesion.  She also had a 90% stenosis of the proximal RCA that  was somewhat hazy.  I was asked to evaluate and felt percutaneous  intervention was indicated.  She was brought to the lab for this procedure.   PROCEDURAL TECHNIQUE:  Informed consent was obtained.  Sheath was exchanged  over a wire for a fresh 6 [place] sheath.  An additional bolus of  eptifibatide was administered.  Additional heparin was given to achieve and  maintain an ACT of greater than 200 seconds.  600 mg of Plavix was  administered.   Attention was first turned to the RCA.  A 6 [place] JR-4 guide was advanced  over a wire and engaged in the ostium of the RCA.  A Prowater wire was  advanced without difficulty into the distal vessels.  The lesion was  directly stented using a 3.5 x 13 mm Cypher deployed at 18 atmospheres.  The  proximal and midportions of the stent were then postdilated using a 4 x 8 mm  Quantum at 16 atmospheres proximally and 20 atmospheres in the midportion.  Final  angiography demonstrated no residual stenosis and TIMI III flow to the  distal vasculature.   Attention was then turned to the lesion in the mid-LAD.  A CLS 3.5 guide was  advanced over a wire and engaged in the ostium of the left main.  A Prowater  wire was advanced to the distal LAD without difficulty.  The lesion was  predilated using a 2.5 x 20 mm Maverick at 6 atmospheres.  It was then  stented using a 3 x 28 mm Cypher at 16 atmospheres.  Repeat angiography  demonstrated a plaque shift causing worsening of the diagonal stenosis from  30% to 90%.  A BMW wire was then advanced into the diagonal, out the side of  the stent.  The diagonal was then dilated using a 2.5 x 9 mm Maverick at 8  atmospheres.  The entirety of the LAD was then postdilated using a 3 x 20 mm  Quantum at 13 atmospheres throughout its length.  Kissing balloon  angioplasty with a 2.5 x 9 mm Maverick in the ostium of the  diagonal and a 3  x 20 mm Quantum in the LAD was then performed at 8 atmospheres in the  diagonal and 12 atmospheres in the LAD.  Final angiography demonstrated no  residual stenosis within the LAD, less than 50% residual stenosis within the  diagonal, and TIMI III flow in each.  The patient tolerated the procedure  well and was transferred to the holding room in stable condition.   COMPLICATIONS:  None.   IMPRESSION/RECOMMENDATIONS:  Successful drug-eluting stent placement in both  the proximal RCA and mid-LAD.  Plavix should be continued for a year, given  her acute presentation.  Aspirin will be continued indefinitely.  High-  potency statin will be initiated.  ARB will be continued.      WED/MEDQ  D:  03/08/2005  T:  03/08/2005  Job:  [id number]   cc:   Geoffry Paradise, M.D.  [address]  [place]  [place] [address]  Fax: [phone]   Arvilla Meres, M.D. [facility]

## 2011-04-16 NOTE — Discharge Summary (Signed)
NAMEKATHEREN, Erika Brown               ACCOUNT NO.:  [id number]   MEDICAL RECORD NO.:  [id number]          PATIENT TYPE:  INP   LOCATION:  6533                         FACILITY:  [facility]   PHYSICIAN:  Theodore Demark, P.A. LHCDATE OF BIRTH:  Jun 19, 1941   DATE OF ADMISSION:  03/06/2005  DATE OF DISCHARGE:                                 DISCHARGE SUMMARY   PROCEDURES:  1.  Cardiac catheterization.  2.  Coronary arteriogram.  3.  Left ventriculogram.  4.  Percutaneous transluminal coronary angioplasty and CYPHER stent to 2      vessels.   DISCHARGE DIAGNOSES:  1.  Non-ST segment elevation myocardial infarction.  2.  Hyperlipidemia.  3.  Hyperglycemia with a hemoglobin A1c of 6.0.  4.  Anemia postprocedure with normal iron levels.  5.  Fecal occult blood negative.  6.  Hypertension.  7.  Seafood allergy, pretreated with steroids prior to catheterization.  8.  Status post hysterectomy, lumpectomy, hemorrhoidectomy and bladder      repair.   HOSPITAL COURSE:  Ms. Landgrebe is a 70 year old female with no known history of  coronary artery disease. She had chest pain on 03/06/2005 and came to the  emergency room where she was admitted. She called EMS and reportedly had ST-  segment depression, but no strips are available for review. Her symptoms  resolved with nitroglycerin. She was admitted for further evaluation and  treatment.   Her cardiac enzymes had elevation indicating a non-ST segment elevation MI  with a peak CK/MB of 68/4.2  and a peak troponin I of 0.48. It was felt that  cardiac catheterization was indicated and this was performed on 03/08/2005.   The cardiac catheterization showed 75-80% LAD and a 99% RCA. She had  nonobstructive 30-60% disease in other vessels and her EF was 50%. Dr.  Samule Ohm placed CYPHER stents to the RCA and mid LAD reducing each stenosis to  0. She is on Plavix for 1 year and aspirin indefinitely. She had Lipitor 80  added to her medication regimen based on  study data as well.   A lipid profile showed a total cholesterol of 185, triglycerides 51, HDL 62,  LDL 113. Her potassium was low at 3.2, upon arrival, and this was  supplemented; but it dropped, again, postcath to 3.1. It was supplemented,  but she will be placed on 10 mEq a day. Her blood sugars were elevated with  a peak of 176. Hemoglobin A1c is within normal limits at 6.0%; and she is  advised to follow a diet that is reduced in process sugars and heart  healthy. She is to follow up with a primary MD.   She had some anemia postprocedure with a hemoglobin of 10.5 and hematocrit  of 31.6. Iron profile was within normal limits. Ms. Revuelta was seen by  cardiac rehab is to follow up with then as an outpatient as well.   By 03/09/2005 Ms. Karam was ambulating without shortness of breath. She had  some chest pain only with deep inspiration which was completely unlike her  anginal symptoms. Pending evaluation by MD, she is  tentatively considered  stable for discharge with outpatient followup arranged.   DISCHARGE INSTRUCTIONS:  1.  She is not to drive for 3 days and no lifting for 2 weeks.  2.  She is to follow a diet that is low in fat and processed sugars.  3.  She is to call the office for problems with the cath site.  4.  She can return to work when cleared by MD.  5.  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 [name], as needed.   DISCHARGE MEDICATIONS:  1.  Nitroglycerin sublingual p.r.n.  2.  Plavix 75 days mg a day for a year.  3.  Coated aspirin 325 mg daily for a year, then okay to decrease the 81 mg.  4.  Lipitor 80 mg daily.  5.  Metoprolol 25 mg b.i.d.  6.  Diovan HCT 160/12.5 mg daily.  7.  K-Dur 10 mg daily.   She is to get liver and cholesterol tests in 6-12 weeks; and she is to get a  BMET at office visit.      RB/MEDQ  D:  03/09/2005  T:  03/09/2005  Job:  [id number]   cc:   Arvilla Meres, M.D. [facility]   Geoffry Paradise, M.D.  [address]  [place]  [place] [address]  Fax: [phone]

## 2011-04-16 NOTE — Telephone Encounter (Signed)
Pt calling re question on her bp

## 2011-04-19 ENCOUNTER — Ambulatory Visit (INDEPENDENT_AMBULATORY_CARE_PROVIDER_SITE_OTHER): Payer: Medicare Other

## 2011-04-19 VITALS — BP 122/72 | HR 70 | Ht 65.0 in | Wt 149.4 lb

## 2011-04-19 DIAGNOSIS — I2510 Atherosclerotic heart disease of native coronary artery without angina pectoris: Secondary | ICD-10-CM

## 2011-04-19 DIAGNOSIS — I10 Essential (primary) hypertension: Secondary | ICD-10-CM

## 2011-04-19 NOTE — Progress Notes (Signed)
Pt comes in today for blood pressure and ekg check.  She has noted on her electronic bp monitor a "heart symbol" flashing when she checked her bp.  She thought this meant she had an irregular heartbeat.  When reviewed, this symbol meant her bp was above normal values.  Pt understands this now.  She also knows which symbol mean irregular heartbeat.  We reviewed the past week worth of bp on her machine and there were no irregular hb noted.  Pt does note that this is a particularly stressful week with the 2 year anniversary of her daughter's death today and said daughter's birthday tomorrow.  It is also her husband and a son's birthday this week.   EKG performed. Pt is also due for 6 month folllow-up with Dr. Gala Romney which she will schedule today.  All questions answered and medications reviewed.  Reassurance given to pt and husband.   Mylo Red RN

## 2011-05-03 ENCOUNTER — Encounter: Payer: Self-pay | Admitting: Internal Medicine

## 2011-05-03 DIAGNOSIS — Z91013 Allergy to seafood: Secondary | ICD-10-CM | POA: Insufficient documentation

## 2011-05-21 ENCOUNTER — Encounter: Payer: Self-pay | Admitting: *Deleted

## 2011-05-24 ENCOUNTER — Ambulatory Visit (INDEPENDENT_AMBULATORY_CARE_PROVIDER_SITE_OTHER): Payer: Medicare Other | Admitting: Internal Medicine

## 2011-05-24 ENCOUNTER — Encounter: Payer: Self-pay | Admitting: Internal Medicine

## 2011-05-24 VITALS — BP 128/72 | HR 68 | Ht 65.5 in | Wt 151.8 lb

## 2011-05-24 DIAGNOSIS — I10 Essential (primary) hypertension: Secondary | ICD-10-CM

## 2011-05-24 DIAGNOSIS — E785 Hyperlipidemia, unspecified: Secondary | ICD-10-CM

## 2011-05-24 DIAGNOSIS — I2510 Atherosclerotic heart disease of native coronary artery without angina pectoris: Secondary | ICD-10-CM

## 2011-05-24 NOTE — Patient Instructions (Signed)
Your physician wants you to follow-up in:  6 months. You will receive a reminder letter in the mail two months in advance. If you don't receive a letter, please call our office to schedule the follow-up appointment.   

## 2011-05-24 NOTE — Assessment & Plan Note (Signed)
Unable to tolerate any statins. Have suggested restarting Red Yeast Rice as tolerated for plaque stabilization.

## 2011-05-24 NOTE — Assessment & Plan Note (Signed)
No evidence of ischemia. Continue current regimen.   

## 2011-05-24 NOTE — Progress Notes (Signed)
HPI:  Erika Brown is a very pleasant 70 year old woman with history of coronary artery disease, hypertension, hyperlipidemia with intolerance to all cholesterol medications and anxiety. She did had a Myoview 10/09 showed a small area of reversible ischemia in the inferior wall but we've been treating this medically.  She returns today for followup. Doing very well.  Still playing tennis regularly. No CP or SOB. BP well controlled. Cut amlodipine in half due to low BP. No longer taking red yeast rice. Couldn't tolerate statins in past.   ROS: All systems negative except as listed in HPI, PMH and Problem List.  Past Medical History  Diagnosis Date  . CAD (coronary artery disease)     S/P NSTEMI 4/06 EF 60%; Treated with Cypher DES to LAD and RCA with kissing balloon PCI diagonal; Treadmill myoview 10/09 for CP: Walked 7:06. mild ST-T-wave changes in recovery. EF 71%. Very mild reversible defect in base of the inferior wall --> medical rx.  . Hypertension     Possible white-coat component  . Hyperlipidemia     Intolerate to multiple statins due to severe myalgias  . Anxiety and depression   . Glucose intolerance (impaired glucose tolerance)   . History of medication noncompliance   . Seafood allergy   . Shellfish allergy     Current Outpatient Prescriptions  Medication Sig Dispense Refill  . amLODipine (NORVASC) 5 MG tablet Take 2.5 mg by mouth daily.       [name] aspirin 81 MG EC tablet Take 81 mg by mouth daily.        . clopidogrel (PLAVIX) 75 MG tablet Take 75 mg by mouth daily.        . ergocalciferol (VITAMIN D2) 50000 UNITS capsule Take 50,000 Units by mouth once a week.        . hydrocortisone (ANUSOL-HC) 2.5 % rectal cream Place 1 application rectally. As needed       . metoprolol (TOPROL-XL) 50 MG 24 hr tablet Take 50 mg by mouth daily.        . nitroGLYCERIN (NITROSTAT) 0.4 MG SL tablet Place 0.4 mg under the tongue every 5 (five) minutes as needed.        . potassium chloride SA  (K-DUR,KLOR-CON) 20 MEQ tablet Take 20 mEq by mouth daily.       . valsartan-hydrochlorothiazide (DIOVAN-HCT) 320-25 MG per tablet Take 1 tablet by mouth daily.        [name] DISCONTD: Red Yeast Rice 600 MG CAPS Take 2 capsules by mouth daily.           PHYSICAL EXAM: Filed Vitals:   05/24/11 1532  BP: 128/72  Pulse: 68   General:  Well appearing. No resp difficulty HEENT: normal Neck: supple. JVP flat. Carotids 2+ bilaterally; no bruits. No lymphadenopathy or thryomegaly appreciated. Cor: PMI normal. Regular rate & rhythm. No rubs, gallops or murmurs. Lungs: clear Abdomen: soft, nontender, nondistended. No hepatosplenomegaly. No bruits or masses. Good bowel sounds. Extremities: no cyanosis, clubbing, rash, edema Neuro: alert & orientedx3, cranial nerves grossly intact. Moves all 4 extremities w/o difficulty. Affect pleasant.    ECG:   ASSESSMENT & PLAN:

## 2011-05-24 NOTE — Assessment & Plan Note (Signed)
Blood pressure well controlled. Continue current regimen. Can stop amlodipine if SBP running too low. Keep under 130 though.

## 2011-07-13 ENCOUNTER — Other Ambulatory Visit: Payer: Self-pay | Admitting: Internal Medicine

## 2011-07-13 NOTE — Telephone Encounter (Signed)
Phar. Contacted [place] on Friday and have had no response and on Monday.  Pt is now calling for these to be filled.

## 2011-07-14 MED ORDER — VALSARTAN-HYDROCHLOROTHIAZIDE 320-25 MG PO TABS: 1.0000 | ORAL_TABLET | Freq: Every day | ORAL | Status: DC

## 2011-07-14 MED ORDER — POTASSIUM CHLORIDE CRYS ER 20 MEQ PO TBCR: 20.0000 meq | EXTENDED_RELEASE_TABLET | Freq: Every day | ORAL | Status: DC

## 2011-07-14 MED ORDER — CLOPIDOGREL BISULFATE 75 MG PO TABS: 75.0000 mg | ORAL_TABLET | Freq: Every day | ORAL | Status: DC

## 2011-09-20 ENCOUNTER — Other Ambulatory Visit: Payer: Self-pay

## 2011-09-20 MED ORDER — METOPROLOL SUCCINATE ER 50 MG PO TB24: 50.0000 mg | ORAL_TABLET | Freq: Every day | ORAL | Status: DC

## 2011-10-27 ENCOUNTER — Other Ambulatory Visit: Payer: Self-pay | Admitting: Internal Medicine

## 2011-10-27 MED ORDER — AMLODIPINE BESYLATE 5 MG PO TABS: 2.5000 mg | ORAL_TABLET | Freq: Every day | ORAL | Status: DC

## 2011-11-19 ENCOUNTER — Other Ambulatory Visit: Payer: Self-pay | Admitting: *Deleted

## 2011-11-19 MED ORDER — METOPROLOL SUCCINATE ER 50 MG PO TB24: 50.0000 mg | ORAL_TABLET | Freq: Every day | ORAL | Status: DC

## 2011-12-31 ENCOUNTER — Encounter: Payer: Self-pay | Admitting: Cardiology

## 2012-01-31 ENCOUNTER — Encounter: Payer: PRIVATE HEALTH INSURANCE | Admitting: Cardiology

## 2012-03-01 ENCOUNTER — Encounter: Payer: Self-pay | Admitting: *Deleted

## 2012-03-02 ENCOUNTER — Encounter: Payer: Self-pay | Admitting: Cardiology

## 2012-03-02 ENCOUNTER — Ambulatory Visit (INDEPENDENT_AMBULATORY_CARE_PROVIDER_SITE_OTHER): Payer: Medicare Other | Admitting: Cardiology

## 2012-03-02 VITALS — BP 118/64 | HR 68 | Ht 64.5 in | Wt 150.4 lb

## 2012-03-02 DIAGNOSIS — E7800 Pure hypercholesterolemia, unspecified: Secondary | ICD-10-CM

## 2012-03-02 DIAGNOSIS — I2510 Atherosclerotic heart disease of native coronary artery without angina pectoris: Secondary | ICD-10-CM

## 2012-03-02 DIAGNOSIS — I10 Essential (primary) hypertension: Secondary | ICD-10-CM

## 2012-03-02 DIAGNOSIS — E785 Hyperlipidemia, unspecified: Secondary | ICD-10-CM

## 2012-03-02 MED ORDER — PRAVASTATIN SODIUM 20 MG PO TABS: ORAL_TABLET | ORAL | Status: DC

## 2012-03-02 NOTE — Patient Instructions (Signed)
Stop amlodipine(norvasc).  Take and record your blood pressure daily. I will call you in 2 weeks and get the readings. Luana Shu [phone]  Start pravachol 20mg  every other day in the evening.  Take coenzyme Q10 200mg  daily. You do not need a prescription for this.  Your physician recommends that you return for a FASTING lipid profile /liver profile/BMET in 2 months.  Your physician wants you to follow-up in: 6 months with Dr Shirlee Latch. (September 2013). You will receive a reminder letter in the mail two months in advance. If you don't receive a letter, please call our office to schedule the follow-up appointment.

## 2012-03-02 NOTE — Assessment & Plan Note (Signed)
SBP has been running in the 110s most of the time.  She wants to stop some of her BP meds.  I will let her try stopping amlodipine.  She will check her BP daily off amlodipine and we will call in 2 wks to see what BP is running.

## 2012-03-02 NOTE — Assessment & Plan Note (Signed)
No ischemic symptoms.  Good exercise tolerance. Continue Toprol XL, ARB, Plavix, and ASA 81 mg daily.

## 2012-03-02 NOTE — Assessment & Plan Note (Signed)
Resistant to trying another statin.  She did seem to tolerate red yeast rice extract, which contains pravastatin.  She is willing to try a very low dose of pravastatin.  I will put her on pravastatin 20 mg every other day along with coenzyme Q10 200 mg daily.  If she tolerates this, will titrate up.  Lipids/LFTs in 2 months.

## 2012-03-02 NOTE — Progress Notes (Signed)
PCP: Dr. [name]  71 yo with history of CAD s/p NSTEMI in 4/06, hyperlipidemia, and HTN presents for cardiology followup.  She has seen Dr. Gala Romney in the past and is seeing me for the first time today.  Lately, she has been doing well symptomatically.  SBP has been running in the 110s when she checks it at home (118/64 today) and she wants to stop some of her blood pressure medicine.  She does yardwork and plays doubles tennis with no exertional dyspnea or chest pain.  She is not on a statin. She took Lipitor in the past and had very severe myalgias.  She was taking red yeast rice extract without problems but stopped it because she was concerned that it could cause memory problems.   ECG: NSR, old ASMI  Labs (3/13): K 3.7, creatinine 0.8, LDL 130, HDL 37, TSH normal  PMH: 1. Hyperlipidemia: Intolerant to Lipitor due to myalgias.  Resistant to starting other statins.  2. Anxiety 3. HTN 4. CAD: NSTEMI 4/06 with Cypher DES to LAD and RCA and PTCA to diagonal.  Last myoview in 10/09 with EF 71%, and small area of ischemia at the inferior base.   5. Shellfish allergy 6. Impared fasting glucose  SH: Married with 7 children.  Lives in [place]. Nonsmoker.   FH: No premature CAD  ROS: All systems reviewed and negative except as per HPI.   Current Outpatient Prescriptions  Medication Sig Dispense Refill  . aspirin 81 MG EC tablet Take 81 mg by mouth daily.        . clopidogrel (PLAVIX) 75 MG tablet Take 1 tablet (75 mg total) by mouth daily.  30 tablet  12  . ergocalciferol (VITAMIN D2) 50000 UNITS capsule Take 50,000 Units by mouth once a week.        . metoprolol (TOPROL-XL) 50 MG 24 hr tablet Take 1 tablet (50 mg total) by mouth daily.  30 tablet  7  . nitroGLYCERIN (NITROSTAT) 0.4 MG SL tablet Place 0.4 mg under the tongue every 5 (five) minutes as needed.        . potassium chloride SA (K-DUR,KLOR-CON) 20 MEQ tablet Take 1 tablet (20 mEq total) by mouth daily.  30 tablet  12  .  valsartan-hydrochlorothiazide (DIOVAN-HCT) 320-25 MG per tablet Take 1 tablet by mouth daily.  30 tablet  12  . Coenzyme Q10 200 MG capsule Take 1 capsule (200 mg total) by mouth daily.      . pravastatin (PRAVACHOL) 20 MG tablet Take one every other day in the evening  15 tablet  3   BP 118/64  Pulse 68  Ht 5' 4.5" (1.638 m)  Wt 150 lb 6.4 oz (68.221 kg)  BMI 25.42 kg/m2 General: NAD Neck: No JVD, no thyromegaly or thyroid nodule.  Lungs: Clear to auscultation bilaterally with normal respiratory effort. CV: Nondisplaced PMI.  Heart regular S1/S2, no S3/S4, no murmur.  No peripheral edema.  No carotid bruit.  Normal pedal pulses.  Abdomen: Soft, nontender, no hepatosplenomegaly, no distention.  Neurologic: Alert and oriented x 3.  Psych: Normal affect. Extremities: No clubbing or cyanosis.

## 2012-03-16 ENCOUNTER — Telehealth: Payer: Self-pay | Admitting: *Deleted

## 2012-03-16 NOTE — Telephone Encounter (Signed)
Pt aware.

## 2012-03-16 NOTE — Telephone Encounter (Signed)
HYPERTENSION, BENIGN - [name], MD 03/02/2012 9:09 PM Signed  SBP has been running in the 110s most of the time. She wants to stop some of her BP meds. I will let her try stopping amlodipine. She will check her BP daily off amlodipine and we will call in 2 wks to see what BP is running.   03/16/12--spoke with pt. Pt states BP has been good. Most readings have been 1 teens/high 60s-70. Pt not taking amlodipine. I will forward to Dr Shirlee Latch for review.

## 2012-03-16 NOTE — Telephone Encounter (Signed)
That is ok 

## 2012-05-08 ENCOUNTER — Other Ambulatory Visit: Payer: Medicare Other

## 2012-05-16 ENCOUNTER — Other Ambulatory Visit (INDEPENDENT_AMBULATORY_CARE_PROVIDER_SITE_OTHER): Payer: Medicare Other

## 2012-05-16 DIAGNOSIS — E7800 Pure hypercholesterolemia, unspecified: Secondary | ICD-10-CM

## 2012-05-16 DIAGNOSIS — I2510 Atherosclerotic heart disease of native coronary artery without angina pectoris: Secondary | ICD-10-CM

## 2012-05-16 LAB — LIPID PANEL: Triglycerides: 64 mg/dL (ref 0.0–149.0)

## 2012-05-16 LAB — HEPATIC FUNCTION PANEL
AST: 18 U/L (ref 0–37)
Albumin: 3.9 g/dL (ref 3.5–5.2)
Alkaline Phosphatase: 50 U/L (ref 39–117)
Bilirubin, Direct: 0 mg/dL (ref 0.0–0.3)
Total Bilirubin: 0.6 mg/dL (ref 0.3–1.2)
Total Protein: 6.8 g/dL (ref 6.0–8.3)

## 2012-08-01 ENCOUNTER — Encounter: Payer: Self-pay | Admitting: Gastroenterology

## 2012-08-07 ENCOUNTER — Other Ambulatory Visit (HOSPITAL_COMMUNITY): Payer: Self-pay | Admitting: Internal Medicine

## 2012-08-07 ENCOUNTER — Other Ambulatory Visit: Payer: Self-pay | Admitting: Cardiology

## 2012-09-08 ENCOUNTER — Other Ambulatory Visit: Payer: Self-pay | Admitting: Internal Medicine

## 2012-10-05 ENCOUNTER — Other Ambulatory Visit: Payer: Self-pay | Admitting: Cardiology

## 2012-10-06 NOTE — Telephone Encounter (Signed)
Fax Received. Refill Completed. Erika Brown (R.M.A)   

## 2012-11-06 ENCOUNTER — Ambulatory Visit (INDEPENDENT_AMBULATORY_CARE_PROVIDER_SITE_OTHER): Payer: Medicare Other | Admitting: Cardiology

## 2012-11-06 ENCOUNTER — Encounter: Payer: Self-pay | Admitting: Cardiology

## 2012-11-06 VITALS — BP 142/82 | HR 72 | Ht 64.5 in | Wt 150.0 lb

## 2012-11-06 DIAGNOSIS — I10 Essential (primary) hypertension: Secondary | ICD-10-CM

## 2012-11-06 DIAGNOSIS — I2510 Atherosclerotic heart disease of native coronary artery without angina pectoris: Secondary | ICD-10-CM

## 2012-11-06 DIAGNOSIS — E785 Hyperlipidemia, unspecified: Secondary | ICD-10-CM

## 2012-11-06 NOTE — Progress Notes (Signed)
Patient ID: Erika Brown, female   DOB: [birth year], 71 y.o.   MRN: [id number] PCP: Dr. [name]  71 yo with history of CAD s/p NSTEMI in 4/06, hyperlipidemia, and HTN presents for cardiology followup.   Lately, she has been doing well symptomatically.  SBP has been running in the 120s when she checks it at home (142/82 today).  After last appointment, she stopped amlodipine.  No chest pain or exertional dyspnea.  She is fairly active in general.  She has been very reticent to take a statin.  At last appointment, I asked her to start pravastatin 20 mg daily.  She has been taking it 1-2 times a week.   Labs (3/13): K 3.7, creatinine 0.8, LDL 130, HDL 37, TSH normal Labs (6/13): K 3.4, creatinine 0.8, LDL 86, HDL 77  PMH: 1. Hyperlipidemia: Intolerant to Lipitor due to myalgias.  Resistant to starting other statins.  2. Anxiety 3. HTN 4. CAD: NSTEMI 4/06 with Cypher DES to LAD and RCA and PTCA to diagonal.  Last myoview in 10/09 with EF 71%, and small area of ischemia at the inferior base.   5. Shellfish allergy 6. Impared fasting glucose  SH: Married with 7 children.  [place]ives in [place]. Nonsmoker.   FH: No premature CAD  Current Outpatient Prescriptions  Medication Sig Dispense Refill  . aspirin 81 MG EC tablet Take 81 mg by mouth daily.        . Biotin 2500 MCG CAPS Take 250 mg by mouth daily.      . Coenzyme Q10 200 MG capsule Take 1 capsule (200 mg total) by mouth daily.      [name] DIOVAN HCT 320-25 MG per tablet TAKE 1 TABLET ONCE DAILY.  30 each  6  . ergocalciferol (VITAMIN D2) 50000 UNITS capsule Take 50,000 Units by mouth once a week.        [name] K-DUR 20 MEQ tablet TAKE 1 TABLET ONCE DAILY.  30 each  6  . metoprolol succinate (TOPROL-XL) 50 MG 24 hr tablet TAKE 1 TABLET ONCE DAILY.  30 tablet  6  . NITROSTAT 0.4 MG SL tablet DISSOLVE 1 TABLET UNDER TONGUE AS NEEDED FOR CHEST PAIN,MAY REPEAT IN5 MINUTES UP TO 3 DOSES.  25 tablet  11  . PLAVIX 75 MG tablet TAKE 1 TABLET ONCE DAILY.  30  each  6  . pravastatin (PRAVACHOL) 20 MG tablet TAKE 1 TABLET EVERY OTHER DAY IN THE EVENING.  15 tablet  5   BP 142/82  Pulse 72  Ht 5' 4.5" (1.638 m)  Wt 150 lb (68.04 kg)  BMI 25.35 kg/m2  SpO2 98% General: NAD Neck: No JVD, no thyromegaly or thyroid nodule.  Lungs: Clear to auscultation bilaterally with normal respiratory effort. CV: Nondisplaced PMI.  Heart regular S1/S2, no S3/S4, no murmur.  No peripheral edema.  No carotid bruit.  Normal pedal pulses.  Abdomen: Soft, nontender, no hepatosplenomegaly, no distention.  Neurologic: Alert and oriented x 3.  Psych: Normal affect. Extremities: No clubbing or cyanosis.   Assessment/Plan:  CAD, NATIVE VESSEL No ischemic symptoms. Good exercise tolerance. Continue Toprol XL, ARB, Plavix, and ASA 81 mg daily.  HYPERLIPIDEMIA She is taking pravastatin 1-2 times a week along with coenzyme Q10 200 mg daily. She has had not problems with it.  I asked her to at least start taking the pravastatin every other day.   HYPERTENSION BP has been stable off amlodipine.  Continue current regimen.   [name] 11/06/2012

## 2012-11-06 NOTE — Patient Instructions (Addendum)
Your physician wants you to follow-up in: 1 year with Dr McLean. (December 2014).You will receive a reminder letter in the mail two months in advance. If you don't receive a letter, please call our office to schedule the follow-up appointment.  

## 2012-11-29 DIAGNOSIS — Z0271 Encounter for disability determination: Secondary | ICD-10-CM

## 2013-02-21 ENCOUNTER — Ambulatory Visit: Payer: Medicare Other

## 2013-02-21 ENCOUNTER — Ambulatory Visit (INDEPENDENT_AMBULATORY_CARE_PROVIDER_SITE_OTHER): Payer: Medicare Other | Admitting: Family Medicine

## 2013-02-21 DIAGNOSIS — S8001XA Contusion of right knee, initial encounter: Secondary | ICD-10-CM

## 2013-02-21 DIAGNOSIS — S20219A Contusion of unspecified front wall of thorax, initial encounter: Secondary | ICD-10-CM

## 2013-02-21 DIAGNOSIS — K148 Other diseases of tongue: Secondary | ICD-10-CM

## 2013-02-21 DIAGNOSIS — S8000XA Contusion of unspecified knee, initial encounter: Secondary | ICD-10-CM

## 2013-02-21 NOTE — Patient Instructions (Addendum)
Let us know if your aches and pains do not ease off over the next week or so- if you have any acute changes in your chest discomfort or any other concerns please seek care right away
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
## 2013-02-26 ENCOUNTER — Ambulatory Visit (INDEPENDENT_AMBULATORY_CARE_PROVIDER_SITE_OTHER): Payer: Medicare Other | Admitting: Family Medicine

## 2013-02-26 ENCOUNTER — Telehealth: Payer: Self-pay

## 2013-02-26 VITALS — BP 130/70 | HR 76 | Temp 98.7°F | Resp 16 | Ht 65.0 in | Wt 147.0 lb

## 2013-02-26 DIAGNOSIS — S1093XD Contusion of unspecified part of neck, subsequent encounter: Secondary | ICD-10-CM

## 2013-02-26 DIAGNOSIS — Z5189 Encounter for other specified aftercare: Secondary | ICD-10-CM

## 2013-02-26 DIAGNOSIS — K148 Other diseases of tongue: Secondary | ICD-10-CM

## 2013-02-26 NOTE — Telephone Encounter (Signed)
Called her to see what her questions are. Left message for her to call me back to advise.

## 2013-02-26 NOTE — Telephone Encounter (Signed)
Patient states she has pain and swelling/ bruising on right side of her jaw (not over bone, but over soft tissue area) she wants to know if she needs to do anything else.

## 2013-02-26 NOTE — Patient Instructions (Addendum)
Let me know if the area under your chin is not going away in the next few weeks.  Be careful chewing so your tongue does not get bitten again. I expect your tongue will be better in the next couple of weeks.   If you would like to have an ultrasound of the area under your chin please let me know and I will arrange this for you,.
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
## 2013-02-26 NOTE — Telephone Encounter (Signed)
Patient says she was in last week to see Dr Patsy Lager she has some questions to as the Doctor if possible please call her [phone]

## 2013-02-27 ENCOUNTER — Telehealth: Payer: Self-pay | Admitting: Family Medicine

## 2013-02-27 NOTE — Telephone Encounter (Signed)
Called Dr. Lanell Matar office- she had a Td booster in 2011, so this is UTD.  Left message for [name] with this information

## 2013-03-13 ENCOUNTER — Encounter: Payer: Self-pay | Admitting: Gastroenterology

## 2013-03-22 ENCOUNTER — Other Ambulatory Visit: Payer: Self-pay | Admitting: Cardiology

## 2013-03-23 ENCOUNTER — Other Ambulatory Visit: Payer: Self-pay | Admitting: Cardiology

## 2013-03-23 MED ORDER — METOPROLOL SUCCINATE ER 50 MG PO TB24: ORAL_TABLET | ORAL | Status: DC

## 2013-03-26 ENCOUNTER — Other Ambulatory Visit: Payer: Self-pay | Admitting: Cardiology

## 2013-07-30 HISTORY — PX: MOHS SURGERY: SUR867

## 2013-12-06 ENCOUNTER — Ambulatory Visit (INDEPENDENT_AMBULATORY_CARE_PROVIDER_SITE_OTHER): Payer: Medicare Other | Admitting: Cardiology

## 2013-12-06 ENCOUNTER — Encounter: Payer: Self-pay | Admitting: Cardiology

## 2013-12-06 VITALS — BP 162/90 | HR 84 | Ht 66.0 in | Wt 151.8 lb

## 2013-12-06 DIAGNOSIS — E785 Hyperlipidemia, unspecified: Secondary | ICD-10-CM

## 2013-12-06 DIAGNOSIS — I10 Essential (primary) hypertension: Secondary | ICD-10-CM

## 2013-12-06 DIAGNOSIS — I2510 Atherosclerotic heart disease of native coronary artery without angina pectoris: Secondary | ICD-10-CM

## 2013-12-06 NOTE — Patient Instructions (Signed)
Your physician wants you to follow-up in: 1 year with Dr Aundra Dubin. (January 2016).  You will receive a reminder letter in the mail two months in advance. If you don't receive a letter, please call our office to schedule the follow-up appointment.

## 2013-12-07 NOTE — Progress Notes (Signed)
Patient ID: Erika Brown, female   DOB: 1941-04-28, 73 y.o.   MRN: [id number] PCP: Dr. Reynaldo Minium  73 yo with history of CAD s/p NSTEMI in 4/06, hyperlipidemia, and HTN presents for cardiology followup.   She has been doing well symptomatically.  SBP has been running in the 120s when she checks it at home (162/90 today).  No chest pain or exertional dyspnea.  She is fairly active in general. She plays tennis. She has been very reticent to take a statin.  I tried her on pravastatin every other day but she developed leg weakness and stopped it.  Weight is stable.    Labs (3/13): K 3.7, creatinine 0.8, LDL 130, HDL 37, TSH normal Labs (6/13): K 3.4, creatinine 0.8, LDL 86, HDL 77  PMH: 1. Hyperlipidemia: Intolerant to Lipitor and pravastatin due to myalgias.  Resistant to starting other statins or Zetia.  2. Anxiety 3. HTN 4. CAD: NSTEMI 4/06 with Cypher DES to LAD and RCA and PTCA to diagonal.  Last myoview in 10/09 with EF 71%, and small area of ischemia at the inferior base.   5. Shellfish allergy 6. Impared fasting glucose  SH: Married with 7 children.  Lives in [place]. Nonsmoker.   FH: No premature CAD  Current Outpatient Prescriptions  Medication Sig Dispense Refill  . aspirin 81 MG EC tablet Take 81 mg by mouth daily.        . clopidogrel (PLAVIX) 75 MG tablet TAKE 1 TABLET ONCE DAILY.  30 tablet  7  . ergocalciferol (VITAMIN D2) 50000 UNITS capsule Take 50,000 Units by mouth once a week.        . metoprolol succinate (TOPROL-XL) 50 MG 24 hr tablet TAKE 1 TABLET ONCE DAILY.  30 tablet  11  . NITROSTAT 0.4 MG SL tablet DISSOLVE 1 TABLET UNDER TONGUE AS NEEDED FOR CHEST PAIN,MAY REPEAT IN5 MINUTES UP TO 3 DOSES.  25 tablet  11  . potassium chloride SA (K-DUR,KLOR-CON) 20 MEQ tablet TAKE 1 TABLET ONCE DAILY.  30 tablet  7  . pravastatin (PRAVACHOL) 20 MG tablet TAKE 1 TABLET EVERY OTHER DAY IN THE EVENING.  15 tablet  5  . valsartan-hydrochlorothiazide (DIOVAN HCT) 320-25 MG per  tablet TAKE 1 TABLET ONCE DAILY.  30 tablet  11   No current facility-administered medications for this visit.   BP 162/90  Pulse 84  Ht 5\' 6"  (1.676 m)  Wt 68.856 kg (151 lb 12.8 oz)  BMI 24.51 kg/m2 General: NAD Neck: No JVD, no thyromegaly or thyroid nodule.  Lungs: Clear to auscultation bilaterally with normal respiratory effort. CV: Nondisplaced PMI.  Heart regular S1/S2, no S3/S4, no murmur.  No peripheral edema.  No carotid bruit.  Normal pedal pulses.  Abdomen: Soft, nontender, no hepatosplenomegaly, no distention.  Neurologic: Alert and oriented x 3.  Psych: Normal affect. Extremities: No clubbing or cyanosis.   Assessment/Plan:  CAD, NATIVE VESSEL No ischemic symptoms. Good exercise tolerance. Continue Toprol XL, ARB, Plavix, and ASA 81 mg daily.  HYPERLIPIDEMIA She did not tolerate pravastatin due to myalgias.  She really does not want to try another statin or Zetia. Continue to work on diet.  I will get copy of most recent lipids from PCP.  HYPERTENSION BP has been excellent at home though high in the office.  Continue to monitor.   Erika Brown 12/07/2013

## 2013-12-10 ENCOUNTER — Ambulatory Visit: Payer: Medicare Other | Admitting: Cardiology

## 2013-12-21 ENCOUNTER — Other Ambulatory Visit: Payer: Self-pay

## 2013-12-21 MED ORDER — CLOPIDOGREL BISULFATE 75 MG PO TABS: ORAL_TABLET | ORAL | Status: DC

## 2013-12-21 MED ORDER — POTASSIUM CHLORIDE CRYS ER 20 MEQ PO TBCR: EXTENDED_RELEASE_TABLET | ORAL | Status: DC

## 2014-01-21 ENCOUNTER — Telehealth: Payer: Self-pay | Admitting: Obstetrics & Gynecology

## 2014-01-21 NOTE — Telephone Encounter (Signed)
Pt is having some vaginal itching and does not want to see anyone but Dr Sabra Heck.

## 2014-01-21 NOTE — Telephone Encounter (Signed)
Thank you for the advice you gave.  Encounter closed.

## 2014-01-21 NOTE — Telephone Encounter (Signed)
LMTCB   (Was going to encourage appt with PG tomorrow. Only appt with SM is Friday at 10 am.)

## 2014-01-21 NOTE — Telephone Encounter (Signed)
Spoke with pt to offer appt as early as tomorrow with PG, or appt on Friday with SM. Pt reports a little discomfort in her lower abdomen, some vaginal itching with a little odor, and an occasional spot of yellow discharge. Denies fever, backache, or pain. Pt adamant about only seeing Dr. Sabra Heck, and was happy to accept Friday appt at 10 am. Advised pt she can call back for an earlier appt with another provider if needed.

## 2014-01-21 NOTE — Telephone Encounter (Signed)
Returning call.

## 2014-01-21 NOTE — Telephone Encounter (Signed)
LMTCB

## 2014-01-25 ENCOUNTER — Other Ambulatory Visit: Payer: Self-pay | Admitting: Obstetrics & Gynecology

## 2014-01-25 ENCOUNTER — Ambulatory Visit (INDEPENDENT_AMBULATORY_CARE_PROVIDER_SITE_OTHER): Payer: Medicare Other | Admitting: Obstetrics & Gynecology

## 2014-01-25 ENCOUNTER — Encounter: Payer: Self-pay | Admitting: Obstetrics & Gynecology

## 2014-01-25 VITALS — BP 148/80 | HR 64 | Resp 16 | Ht 64.5 in | Wt 150.6 lb

## 2014-01-25 DIAGNOSIS — R21 Rash and other nonspecific skin eruption: Secondary | ICD-10-CM

## 2014-01-25 DIAGNOSIS — R350 Frequency of micturition: Secondary | ICD-10-CM

## 2014-01-25 MED ORDER — CLOBETASOL PROPIONATE 0.05 % EX OINT: 1.0000 "application " | TOPICAL_OINTMENT | Freq: Two times a day (BID) | CUTANEOUS | Status: DC

## 2014-01-25 NOTE — Patient Instructions (Signed)

## 2014-01-25 NOTE — Progress Notes (Signed)
Subjective:     Patient ID: Erika Brown, female   DOB: [birth year], 73 y.o.   MRN: [id number]  HPI 73 yo G7P7 WF here for several months hx of vaginal irritation with mild discharge.  Mild odor present at times.  No vaginal bleeding.  Also reports feeling some vulvar itching especially at clitoral hood.  She does at times feel urinary urgency and frequency.  She sometimes feels that she can't start her urine either.  She's not sure if any or all of these are related.  Finally, reports a skin itch/irritation on inner thighs bilaterally.  Has seen a dermatologist.  Given cream with steroid and this does help but pt reports not using it regularly.    Erika Brown with new diagnosis of prostate cancer.  Not sexually active several months.    Review of Systems  All other systems reviewed and are negative.       Objective:   Physical Exam  Constitutional: She is oriented to person, place, and time. She appears well-developed and well-nourished.  Abdominal: Soft. Bowel sounds are normal.  Genitourinary:     Musculoskeletal: Normal range of motion.  Neurological: She is alert and oriented to person, place, and time.  Skin: Skin is warm and dry.  Psychiatric: She has a normal mood and affect.   Biopsy note:  Area for biopsy cleansed with Hibiclens x 3.  1% Lidocaine instilled in lesions.  45mm punch biopsies obtained.  Silver nitrate used for hemostasis.  Dressings applied.  Pt tolerated procedures well.    Assessment:     Vulvar rash/irritation--possible psoriasis, Lichen planus or LS&A Urinary urgency     Plan:     Urine culture Biopsies pending Start Clobetasol ointment BID until results are back.

## 2014-01-27 LAB — URINE CULTURE

## 2014-01-29 ENCOUNTER — Telehealth: Payer: Self-pay

## 2014-01-29 NOTE — Telephone Encounter (Signed)
Lmtcb//kn 

## 2014-01-29 NOTE — Telephone Encounter (Signed)
Message copied by Robley Fries on Tue Jan 29, 2014  9:45 AM ------      Message from: Megan Salon      Created: Mon Jan 28, 2014 11:10 PM       Inform culture negative. ------

## 2014-01-29 NOTE — Telephone Encounter (Signed)
       Notes Recorded by Karen Chafe, RN on 01/29/2014 at 3:06 PM Patient notified of urine culture results. Requesting path results. Advised no results in system yet. Will call with results when they return.

## 2014-02-04 NOTE — Telephone Encounter (Signed)
Patient notified of all results.//kn 

## 2014-03-04 ENCOUNTER — Ambulatory Visit: Payer: Medicare Other | Admitting: Obstetrics & Gynecology

## 2014-03-04 ENCOUNTER — Encounter: Payer: Self-pay | Admitting: Obstetrics & Gynecology

## 2014-03-05 ENCOUNTER — Ambulatory Visit (INDEPENDENT_AMBULATORY_CARE_PROVIDER_SITE_OTHER): Payer: Medicare Other | Admitting: Obstetrics & Gynecology

## 2014-03-05 ENCOUNTER — Encounter: Payer: Self-pay | Admitting: Obstetrics & Gynecology

## 2014-03-05 VITALS — BP 128/66 | HR 64 | Resp 18 | Ht 64.5 in | Wt 151.0 lb

## 2014-03-05 DIAGNOSIS — Z01419 Encounter for gynecological examination (general) (routine) without abnormal findings: Secondary | ICD-10-CM

## 2014-03-05 NOTE — Progress Notes (Signed)
73 y.o. G32P7 MarriedCaucasianF here for annual exam.  Had a nice holiday weekend.  No vaginal discharge.  Had vulvar and leg rash that was biposied in February.  Pt wondering if this is food related as she just ate shrimp over the weekend.  Has a remote hx of rash with shrimp but hadn't eaten it in many years.  Had a biopsy showing spongiotic psoriasiform dermatitis.   Patient's last menstrual period was 11/29/1974.          Sexually active: yes  The current method of family planning is none.    Exercising: yes  Yard, [employer] Smoker:  no  Health Maintenance: Pap:  11/2009 - Neg History of abnormal Pap:  yes MMG:  2008 - BI-RADS 1: Neg Colonoscopy:  2005 BMD:   2008 TDaP:  PCP  Screening Labs: PCP, Hb today: PCP, Urine today: n/a   reports that she has quit smoking. She has never used smokeless tobacco. She reports that she drinks about 2.4 ounces of alcohol per week. She reports that she does not use illicit drugs.  Past Medical History  Diagnosis Date  . CAD (coronary artery disease)     S/P NSTEMI 4/06 EF 60%; Treated with Cypher DES to LAD and RCA with kissing balloon PCI diagonal; Treadmill myoview 10/09 for CP: Walked 7:06. mild ST-T-wave changes in recovery. EF 71%. Very mild reversible defect in base of the inferior wall --> medical rx.  . Hypertension     Possible white-coat component  . Hyperlipidemia     Intolerate to multiple statins due to severe myalgias  . Glucose intolerance (impaired glucose tolerance)   . History of medication noncompliance   . Depression   . Microhematuria   . MI (myocardial infarction)     acute-s/p stent  . Vitamin D deficiency   . Heart murmur   . Psoriasis   . Anxiety and depression     Past Surgical History  Procedure Laterality Date  . Coronary stent placement    . Appendectomy    . Abdominal hysterectomy      and anterior repair  . Mohs surgery  9/14    [facility].  [facility]  . Breast biopsy    . Tonsillectomy and adenoidectomy    .  Bladder tact      Current Outpatient Prescriptions  Medication Sig Dispense Refill  . aspirin 81 MG EC tablet Take 81 mg by mouth daily.        [name] BIOTIN PO Take by mouth daily.      . clobetasol ointment (TEMOVATE) 1.61 % Apply 1 application topically 2 (two) times daily. Apply as directed twice daily  60 g  0  . clopidogrel (PLAVIX) 75 MG tablet TAKE 1 TABLET ONCE DAILY.  30 tablet  7  . Coenzyme Q10 (CO Q 10 PO) Take by mouth daily.      . ergocalciferol (VITAMIN D2) 50000 UNITS capsule Take 50,000 Units by mouth once a week.        . metoprolol succinate (TOPROL-XL) 50 MG 24 hr tablet TAKE 1 TABLET ONCE DAILY.  30 tablet  11  . potassium chloride SA (K-DUR,KLOR-CON) 20 MEQ tablet TAKE 1 TABLET ONCE DAILY.  30 tablet  7  . valsartan-hydrochlorothiazide (DIOVAN HCT) 320-25 MG per tablet TAKE 1 TABLET ONCE DAILY.  30 tablet  11  . NITROSTAT 0.4 MG SL tablet DISSOLVE 1 TABLET UNDER TONGUE AS NEEDED FOR CHEST PAIN,MAY REPEAT IN5 MINUTES UP TO 3 DOSES.  25  tablet  11   No current facility-administered medications for this visit.    Family History  Problem Relation Age of Onset  . Heart disease Father   . Heart attack Father   . Coronary artery disease Neg Hx   . Melanoma Child     Metastatic  . Hypertension Mother   . Cancer Daughter     melanoma  . Stroke Maternal Grandmother   . Stroke Maternal Grandfather   . Diabetes Paternal Grandmother   . Melanoma Mother   . Breast cancer Sister 2  . Hypertension Brother   . Asthma Brother   . Ulcers Father   . Renal Disease Maternal Grandfather     kidney removed  . Depression Daughter     x2  . Rectal cancer Son     ROS:  Pertinent items are noted in HPI.  Otherwise, a comprehensive ROS was negative.  Exam:   BP 128/66  Pulse 64  Resp 18  Ht 5' 4.5" (1.638 m)  Wt 151 lb (68.493 kg)  BMI 25.53 kg/m2  LMP 11/29/1974  Weight change: -2#  Height: 5' 4.5" (163.8 cm)  Ht Readings from Last 3 Encounters:  03/05/14 5' 4.5" (1.638  m)  01/25/14 5' 4.5" (1.638 m)  12/06/13 5\' 6"  (1.676 m)    General appearance: alert, cooperative and appears stated age Head: Normocephalic, without obvious abnormality, atraumatic Neck: no adenopathy, supple, symmetrical, trachea midline and thyroid normal to inspection and palpation Lungs: clear to auscultation bilaterally Breasts: normal appearance, no masses or tenderness Heart: regular rate and rhythm Abdomen: soft, non-tender; bowel sounds normal; no masses,  no organomegaly Extremities: extremities normal, atraumatic, no cyanosis or edema Skin: Skin color, texture, turgor normal. No rashes or lesions Lymph nodes: Cervical, supraclavicular, and axillary nodes normal. No abnormal inguinal nodes palpated Neurologic: Grossly normal   Pelvic: External genitalia:  no lesions              Urethra:  normal appearing urethra with no masses, tenderness or lesions              Bartholins and Skenes: normal                 Vagina: normal appearing vagina with normal color and discharge, no lesions              Cervix: no lesions              Pap taken: no Bimanual Exam:  Uterus:  normal size, contour, position, consistency, mobility, non-tender              Adnexa: normal adnexa and no mass, fullness, tenderness               Rectovaginal: Confirms               Anus:  normal sphincter tone, no lesions  A:  Well Woman with normal exam H/O vulvar rash biopsy showing pongiotic psoriasiform dermatitis.  Has Clobetasol ointment to use if has recurrent issues. PMP, no HRT Occ SUI  P:   Mammogram.  Declines any further.  D/w pt I disagree with this but she states she's lived longer than anybody else in her family so knows she wouldn't treat the cancer. Declines future colonoscopy pap smear not obtained today. Labs with Dr. Reynaldo Minium yearly. Pt to call if (after restarting the steroid bid for 10 days) if the new skin lesions are not completely gone.  May need derm appt. return annually  or  prn  An After Visit Summary was printed and given to the patient.

## 2014-03-06 NOTE — Patient Instructions (Signed)

## 2014-03-23 ENCOUNTER — Other Ambulatory Visit: Payer: Self-pay | Admitting: Cardiology

## 2014-04-18 ENCOUNTER — Other Ambulatory Visit: Payer: Self-pay | Admitting: Cardiology

## 2014-09-04 ENCOUNTER — Other Ambulatory Visit: Payer: Self-pay | Admitting: Cardiology

## 2014-09-30 ENCOUNTER — Encounter: Payer: Self-pay | Admitting: Obstetrics & Gynecology

## 2014-10-31 ENCOUNTER — Other Ambulatory Visit: Payer: Self-pay | Admitting: *Deleted

## 2014-10-31 MED ORDER — METOPROLOL SUCCINATE ER 50 MG PO TB24: 50.0000 mg | ORAL_TABLET | Freq: Every day | ORAL | Status: DC

## 2014-10-31 MED ORDER — VALSARTAN-HYDROCHLOROTHIAZIDE 320-25 MG PO TABS: 1.0000 | ORAL_TABLET | Freq: Every day | ORAL | Status: DC

## 2014-12-02 ENCOUNTER — Telehealth: Payer: Self-pay | Admitting: Cardiology

## 2014-12-02 ENCOUNTER — Emergency Department (HOSPITAL_COMMUNITY)
Admission: EM | Admit: 2014-12-02 | Discharge: 2014-12-03 | Disposition: A | Discharge status: Home / Self Care | Payer: Medicare Other | Attending: Emergency Medicine | Admitting: Emergency Medicine

## 2014-12-02 ENCOUNTER — Encounter (HOSPITAL_COMMUNITY): Payer: Self-pay | Admitting: *Deleted

## 2014-12-02 DIAGNOSIS — Z7982 Long term (current) use of aspirin: Secondary | ICD-10-CM | POA: Insufficient documentation

## 2014-12-02 DIAGNOSIS — I10 Essential (primary) hypertension: Secondary | ICD-10-CM | POA: Insufficient documentation

## 2014-12-02 DIAGNOSIS — Z872 Personal history of diseases of the skin and subcutaneous tissue: Secondary | ICD-10-CM | POA: Diagnosis not present

## 2014-12-02 DIAGNOSIS — Z9861 Coronary angioplasty status: Secondary | ICD-10-CM | POA: Diagnosis not present

## 2014-12-02 DIAGNOSIS — Z7902 Long term (current) use of antithrombotics/antiplatelets: Secondary | ICD-10-CM | POA: Insufficient documentation

## 2014-12-02 DIAGNOSIS — I252 Old myocardial infarction: Secondary | ICD-10-CM | POA: Insufficient documentation

## 2014-12-02 DIAGNOSIS — Z9119 Patient's noncompliance with other medical treatment and regimen: Secondary | ICD-10-CM | POA: Diagnosis not present

## 2014-12-02 DIAGNOSIS — E559 Vitamin D deficiency, unspecified: Secondary | ICD-10-CM | POA: Insufficient documentation

## 2014-12-02 DIAGNOSIS — Z87891 Personal history of nicotine dependence: Secondary | ICD-10-CM | POA: Insufficient documentation

## 2014-12-02 DIAGNOSIS — I2510 Atherosclerotic heart disease of native coronary artery without angina pectoris: Secondary | ICD-10-CM | POA: Insufficient documentation

## 2014-12-02 DIAGNOSIS — Z91040 Latex allergy status: Secondary | ICD-10-CM | POA: Diagnosis not present

## 2014-12-02 DIAGNOSIS — R011 Cardiac murmur, unspecified: Secondary | ICD-10-CM | POA: Insufficient documentation

## 2014-12-02 DIAGNOSIS — Z79899 Other long term (current) drug therapy: Secondary | ICD-10-CM | POA: Insufficient documentation

## 2014-12-02 DIAGNOSIS — Z8659 Personal history of other mental and behavioral disorders: Secondary | ICD-10-CM | POA: Insufficient documentation

## 2014-12-02 LAB — CBC WITH DIFFERENTIAL/PLATELET
Basophils Absolute: 0 K/uL (ref 0.0–0.1)
Basophils Relative: 0 % (ref 0–1)
Eosinophils Absolute: 0.1 K/uL (ref 0.0–0.7)
Eosinophils Relative: 2 % (ref 0–5)
HCT: 43.4 % (ref 36.0–46.0)
Hemoglobin: 14.3 g/dL (ref 12.0–15.0)
Lymphocytes Relative: 30 % (ref 12–46)
Lymphs Abs: 1.8 K/uL (ref 0.7–4.0)
MCH: 27.9 pg (ref 26.0–34.0)
MCHC: 32.9 g/dL (ref 30.0–36.0)
MCV: 84.8 fL (ref 78.0–100.0)
Monocytes Absolute: 0.4 K/uL (ref 0.1–1.0)
Monocytes Relative: 7 % (ref 3–12)
Neutro Abs: 3.5 K/uL (ref 1.7–7.7)
Neutrophils Relative %: 61 % (ref 43–77)
Platelets: 212 K/uL (ref 150–400)
RBC: 5.12 MIL/uL — ABNORMAL HIGH (ref 3.87–5.11)
RDW: 13.6 % (ref 11.5–15.5)
WBC: 5.9 K/uL (ref 4.0–10.5)

## 2014-12-02 NOTE — ED Provider Notes (Signed)
CSN: [id number]     Arrival date & time 12/02/14  2221 History  This chart was scribed for Kalman Drape, MD by Randa Evens, ED Scribe. This patient was seen in room D35C/D35C and the patient's care was started at 11:03 PM.      Chief Complaint  Patient presents with  . Hypertension   Patient is a 74 y.o. female presenting with hypertension. The history is provided by the patient. No language interpreter was used.  Hypertension Pertinent negatives include no chest pain and no shortness of breath.   HPI Comments: MARAE COTTRELL is a 74 y.o. female who presents to the Emergency Department complaining of gradually worsening hypertension onset tonight. Pt describes her HTN as a head fullness. Pt states that today her lowest BP reading was 159/100. States that around 9 PM tonight her BP was 213/133. Pt states she takes metoprolol, and valsartan to control her BP and has been compliant with taking it daily. Pt states she has tried walking up and down stairs to lower her BP. Pt states that over the holidays she did not check her BP regularly and only began to check when she felt the head fullness. Denies CP, SOB, weakness or slurred speech.   Past Medical History  Diagnosis Date  . CAD (coronary artery disease)     S/P NSTEMI 4/06 EF 60%; Treated with Cypher DES to LAD and RCA with kissing balloon PCI diagonal; Treadmill myoview 10/09 for CP: Walked 7:06. mild ST-T-wave changes in recovery. EF 71%. Very mild reversible defect in base of the inferior wall --> medical rx.  . Hypertension     Possible white-coat component  . Hyperlipidemia     Intolerate to multiple statins due to severe myalgias  . Glucose intolerance (impaired glucose tolerance)   . History of medication noncompliance   . Depression   . Microhematuria   . MI (myocardial infarction)     acute-s/p stent  . Vitamin D deficiency   . Heart murmur   . Psoriasis   . Anxiety and depression    Past Surgical History  Procedure  Laterality Date  . Coronary stent placement    . Appendectomy    . Abdominal hysterectomy      and anterior repair  . Mohs surgery  9/14    [facility].  [facility]  . Breast biopsy    . Tonsillectomy and adenoidectomy    . Bladder tact     Family History  Problem Relation Age of Onset  . Heart disease Father   . Heart attack Father   . Coronary artery disease Neg Hx   . Melanoma Child     Metastatic  . Hypertension Mother   . Cancer Daughter     melanoma  . Stroke Maternal Grandmother   . Stroke Maternal Grandfather   . Diabetes Paternal Grandmother   . Melanoma Mother   . Breast cancer Sister 49  . Hypertension Brother   . Asthma Brother   . Ulcers Father   . Renal Disease Maternal Grandfather     kidney removed  . Depression Daughter     x2  . Rectal cancer Son    History  Substance Use Topics  . Smoking status: Former Research scientist (life sciences)  . Smokeless tobacco: Never Used  . Alcohol Use: 2.4 oz/week    4 Glasses of wine per week     Comment: glass of wine at dinner most nights   OB History    Gravida Para  Term Preterm AB TAB SAB Ectopic Multiple Living   7 7        6       Obstetric Comments   One daughter died of melanoma      Review of Systems  Respiratory: Negative for shortness of breath.   Cardiovascular: Negative for chest pain.  Neurological: Negative for speech difficulty and weakness.     Allergies  Iodine; Latex; and Statins  Home Medications   Prior to Admission medications   Medication Sig Start Date End Date Taking? Authorizing Provider  aspirin 81 MG EC tablet Take 81 mg by mouth daily.      Historical Provider, MD  BIOTIN PO Take by mouth daily.    Historical Provider, MD  clobetasol ointment (TEMOVATE) 7.03 % Apply 1 application topically 2 (two) times daily. Apply as directed twice daily 01/25/14   Lyman Speller, MD  clopidogrel (PLAVIX) 75 MG tablet TAKE 1 TABLET ONCE DAILY. 09/05/14   Larey Dresser, MD  Coenzyme Q10 (CO Q 10 PO) Take by mouth  daily.    Historical Provider, MD  ergocalciferol (VITAMIN D2) 50000 UNITS capsule Take 50,000 Units by mouth once a week.      Historical Provider, MD  metoprolol succinate (TOPROL-XL) 50 MG 24 hr tablet Take 1 tablet (50 mg total) by mouth daily. Take with or immediately following a meal. 10/31/14   Larey Dresser, MD  NITROSTAT 0.4 MG SL tablet DISSOLVE 1 TABLET UNDER TONGUE AS NEEDED FOR CHEST PAIN,MAY REPEAT IN5 MINUTES UP TO 3 DOSES.    Larey Dresser, MD  potassium chloride SA (K-DUR,KLOR-CON) 20 MEQ tablet TAKE 1 TABLET ONCE DAILY. 09/05/14   Larey Dresser, MD  valsartan-hydrochlorothiazide (DIOVAN-HCT) 320-25 MG per tablet Take 1 tablet by mouth daily. 10/31/14   Larey Dresser, MD   Triage Vitals: BP 207/105 mmHg  Pulse 84  Temp(Src) 97.7 F (36.5 C) (Oral)  Resp 20  Ht 5\' 5"  (1.651 m)  Wt 152 lb (68.947 kg)  BMI 25.29 kg/m2  SpO2 97%  LMP 11/29/1974  Physical Exam  Constitutional: She is oriented to person, place, and time. She appears well-developed and well-nourished. No distress.  HENT:  Head: Normocephalic and atraumatic.  Right Ear: External ear normal.  Left Ear: External ear normal.  Nose: Nose normal.  Mouth/Throat: Oropharynx is clear and moist.  Eyes: Conjunctivae and EOM are normal. Pupils are equal, round, and reactive to light.  Neck: Normal range of motion. Neck supple. No JVD present. No tracheal deviation present. No thyromegaly present.  Cardiovascular: Normal rate, regular rhythm, normal heart sounds and intact distal pulses.  Exam reveals no gallop and no friction rub.   No murmur heard. Pulmonary/Chest: Effort normal and breath sounds normal. No stridor. No respiratory distress. She has no wheezes. She has no rales. She exhibits no tenderness.  Abdominal: Soft. Bowel sounds are normal. She exhibits no distension and no mass. There is no tenderness. There is no rebound and no guarding.  Musculoskeletal: Normal range of motion. She exhibits no edema or  tenderness.  Lymphadenopathy:    She has no cervical adenopathy.  Neurological: She is alert and oriented to person, place, and time. She displays normal reflexes. No cranial nerve deficit. She exhibits normal muscle tone. Coordination normal.  Skin: Skin is warm and dry. No rash noted. No erythema. No pallor.  Psychiatric: She has a normal mood and affect. Her behavior is normal. Judgment and thought content normal.  Nursing note  and vitals reviewed.   ED Course  Procedures (including critical care time) DIAGNOSTIC STUDIES: Oxygen Saturation is 97% on RA, normal by my interpretation.    COORDINATION OF CARE: 11:28 PM-Discussed treatment plan with pt at bedside and pt agreed to plan.     Labs Review Labs Reviewed  CBC WITH DIFFERENTIAL - Abnormal; Notable for the following:    RBC 5.12 (*)    All other components within normal limits  BASIC METABOLIC PANEL - Abnormal; Notable for the following:    Potassium 3.2 (*)    Glucose, Bld 114 (*)    GFR calc non Af Amer 58 (*)    GFR calc Af Amer 67 (*)    All other components within normal limits  TROPONIN I    Imaging Review No results found.   EKG Interpretation   Date/Time:  Monday December 02 2014 22:39:57 EST Ventricular Rate:  77 PR Interval:  164 QRS Duration: 74 QT Interval:  372 QTC Calculation: 420 R Axis:   53 Text Interpretation:  Normal sinus rhythm ST abnormality, possible  digitalis effect Abnormal ECG new inferiolateral mild  st depressions  Confirmed by Nekhi Liwanag  MD, Mea Ozga ([id number]) on 12/02/2014 11:04:02 PM      MDM   Final diagnoses:  Essential hypertension    74 year old female with hypertension.  Patient complains of some mild pressure.  The front of her head, but her neuro exam is unremarkable.  Plan for labs and EKG to further evaluate for end organ damage  I personally performed the services described in this documentation, which was scribed in my presence. The recorded information has been reviewed  and is accurate.  Case discussed with Golden Hurter, on-call for cardiology.  She recommends patient be placed on a low-salt diet and started on 2.5 of amlodipine.  Patient reports she has been on this before was taken off due to low blood pressures.  Patient received 1 dose in the emergency department prior to leaving.  She knows to follow-up with her cardiologist this week.  She has no other complaints at this time.      Kalman Drape, MD 12/03/14 [phone]

## 2014-12-02 NOTE — ED Notes (Signed)
Patient presents stating her blood pressure was up.  States she has a fullness in her head since this started.  Called her cardiologist and was told to come to the ED

## 2014-12-02 NOTE — ED Notes (Signed)
Dr Otter at bedside  

## 2014-12-02 NOTE — Telephone Encounter (Signed)
Patient called complaining of increased BP over the past few days.TOnight BP 212/139mmHg.  Patient instructed to go to [facility] ER for further evaluation.  She is complaining of head pressure

## 2014-12-03 ENCOUNTER — Telehealth: Payer: Self-pay | Admitting: Cardiology

## 2014-12-03 LAB — BASIC METABOLIC PANEL
Anion gap: 9 (ref 5–15)
BUN: 17 mg/dL (ref 6–23)
CHLORIDE: 101 meq/L (ref 96–112)
CO2: 26 mmol/L (ref 19–32)
Calcium: 10.2 mg/dL (ref 8.4–10.5)
Creatinine, Ser: 0.95 mg/dL (ref 0.50–1.10)
GFR calc Af Amer: 67 mL/min — ABNORMAL LOW (ref >90)
GFR calc non Af Amer: 58 mL/min — ABNORMAL LOW (ref >90)
GLUCOSE: 114 mg/dL — AB (ref 70–99)
POTASSIUM: 3.2 mmol/L — AB (ref 3.5–5.1)
Sodium: 136 mmol/L (ref 135–145)

## 2014-12-03 LAB — TROPONIN I: Troponin I: <0.03 ng/mL (ref <0.031)

## 2014-12-03 MED ORDER — AMLODIPINE BESYLATE 5 MG PO TABS
2.5000 mg | ORAL_TABLET | Freq: Once | ORAL | Status: AC
  Administered 2014-12-03: 2.5 mg via ORAL
  Filled 2014-12-03: qty 1

## 2014-12-03 MED ORDER — AMLODIPINE BESYLATE 2.5 MG PO TABS: 2.5000 mg | ORAL_TABLET | Freq: Every day | ORAL | Status: DC

## 2014-12-03 NOTE — Telephone Encounter (Signed)
Left message  For patient to call back

## 2014-12-03 NOTE — Telephone Encounter (Signed)
Spoke with patient and informed her that Erika Brown would need to schedule her, as there is no openings on Dr. Claris Gladden schedule

## 2014-12-03 NOTE — Discharge Instructions (Signed)
Managing Your High Blood Pressure Blood pressure is a measurement of how forceful your blood is pressing against the walls of the arteries. Arteries are muscular tubes within the circulatory system. Blood pressure does not stay the same. Blood pressure rises when you are active, excited, or nervous; and it lowers during sleep and relaxation. If the numbers measuring your blood pressure stay above normal most of the time, you are at risk for health problems. High blood pressure (hypertension) is a long-term (chronic) condition in which blood pressure is elevated. A blood pressure reading is recorded as two numbers, such as 120 over 80 (or 120/80). The first, higher number is called the systolic pressure. It is a measure of the pressure in your arteries as the heart beats. The second, lower number is called the diastolic pressure. It is a measure of the pressure in your arteries as the heart relaxes between beats.  Keeping your blood pressure in a normal range is important to your overall health and prevention of health problems, such as heart disease and stroke. When your blood pressure is uncontrolled, your heart has to work harder than normal. High blood pressure is a very common condition in adults because blood pressure tends to rise with age. Men and women are equally likely to have hypertension but at different times in life. Before age 75, men are more likely to have hypertension. After 75 years of age, women are more likely to have it. Hypertension is especially common in African Americans. This condition often has no signs or symptoms. The cause of the condition is usually not known. Your caregiver can help you come up with a plan to keep your blood pressure in a normal, healthy range. BLOOD PRESSURE STAGES Blood pressure is classified into four stages: normal, prehypertension, stage 1, and stage 2. Your blood pressure reading will be used to determine what type of treatment, if any, is necessary.  Appropriate treatment options are tied to these four stages:  Normal  Systolic pressure (mm Hg): below 120.  Diastolic pressure (mm Hg): below 80. Prehypertension  Systolic pressure (mm Hg): 120 to 139.  Diastolic pressure (mm Hg): 80 to 89. Stage1  Systolic pressure (mm Hg): 140 to 159.  Diastolic pressure (mm Hg): 90 to 99. Stage2  Systolic pressure (mm Hg): 160 or above.  Diastolic pressure (mm Hg): 100 or above. RISKS RELATED TO HIGH BLOOD PRESSURE Managing your blood pressure is an important responsibility. Uncontrolled high blood pressure can lead to:  A heart attack.  A stroke.  A weakened blood vessel (aneurysm).  Heart failure.  Kidney damage.  Eye damage.  Metabolic syndrome.  Memory and concentration problems. HOW TO MANAGE YOUR BLOOD PRESSURE Blood pressure can be managed effectively with lifestyle changes and medicines (if needed). Your caregiver will help you come up with a plan to bring your blood pressure within a normal range. Your plan should include the following: Education  Read all information provided by your caregivers about how to control blood pressure.  Educate yourself on the latest guidelines and treatment recommendations. New research is always being done to further define the risks and treatments for high blood pressure. Lifestylechanges  Control your weight.  Avoid smoking.  Stay physically active.  Reduce the amount of salt in your diet.  Reduce stress.  Control any chronic conditions, such as high cholesterol or diabetes.  Reduce your alcohol intake. Medicines  Several medicines (antihypertensive medicines) are available, if needed, to bring blood pressure within a normal range.  Communication  Review all the medicines you take with your caregiver because there may be side effects or interactions.  Talk with your caregiver about your diet, exercise habits, and other lifestyle factors that may be contributing to  high blood pressure.  See your caregiver regularly. Your caregiver can help you create and adjust your plan for managing high blood pressure. RECOMMENDATIONS FOR TREATMENT AND FOLLOW-UP  The following recommendations are based on current guidelines for managing high blood pressure in nonpregnant adults. Use these recommendations to identify the proper follow-up period or treatment option based on your blood pressure reading. You can discuss these options with your caregiver.  Systolic pressure of 120 to 139 or diastolic pressure of 80 to 89: Follow up with your caregiver as directed.  Systolic pressure of 140 to 160 or diastolic pressure of 90 to 100: Follow up with your caregiver within 2 months.  Systolic pressure above 160 or diastolic pressure above 100: Follow up with your caregiver within 1 month.  Systolic pressure above 180 or diastolic pressure above 110: Consider antihypertensive therapy; follow up with your caregiver within 1 week.  Systolic pressure above 200 or diastolic pressure above 120: Begin antihypertensive therapy; follow up with your caregiver within 1 week. Document Released: 08/09/2012 Document Reviewed: 08/09/2012 Exit[employer] Patient Information 2015 [employer]. This information is not intended to replace advice given to you by your health care provider. Make sure you discuss any questions you have with your health care provider.  

## 2014-12-03 NOTE — Telephone Encounter (Signed)
Returned patient's call. Explained to patient that Dr. Aundra Dubin is not in the office today, but I would send him a message to see if he can see her this week. Patient does not want to see a PA. Will forward to Dr. Aundra Dubin for further instructions. Patient verbalized understanding.

## 2014-12-03 NOTE — ED Notes (Signed)
Dr. Sharol Given informed of patients BP of 185/100 and she states she is okay with patient being discharged. Pt informed. Pt A&Ox4, ambulatory at d/c with steady gait, NAD.

## 2014-12-03 NOTE — Telephone Encounter (Signed)
New Message     Patient was seen in ER 12/02/14 for BP 205/106 (Estimated bc she didn't know the exact but was in that range) Has an appt for 12/17/14 but was advised to see the Cardiologist sooner. Please call patient . Thanks

## 2014-12-03 NOTE — Telephone Encounter (Signed)
Please work her in to see me in the morning tomorrow or some other time this week at [facility] office.

## 2014-12-04 NOTE — Telephone Encounter (Signed)
This has been done, pt aware of appt 12/05/14 with Dr Aundra Dubin

## 2014-12-04 NOTE — Telephone Encounter (Signed)
Attempted to contact pt X 3 and got fast busy signal, not connected to patient.

## 2014-12-05 ENCOUNTER — Encounter: Payer: Self-pay | Admitting: Cardiology

## 2014-12-05 ENCOUNTER — Ambulatory Visit (INDEPENDENT_AMBULATORY_CARE_PROVIDER_SITE_OTHER): Payer: Medicare Other | Admitting: Cardiology

## 2014-12-05 VITALS — BP 130/72 | HR 72 | Ht 65.0 in | Wt 151.0 lb

## 2014-12-05 DIAGNOSIS — I10 Essential (primary) hypertension: Secondary | ICD-10-CM

## 2014-12-05 DIAGNOSIS — I2510 Atherosclerotic heart disease of native coronary artery without angina pectoris: Secondary | ICD-10-CM

## 2014-12-05 DIAGNOSIS — E785 Hyperlipidemia, unspecified: Secondary | ICD-10-CM

## 2014-12-05 LAB — LIPID PANEL
CHOLESTEROL: 226 mg/dL — AB (ref 0–200)
HDL: 77.6 mg/dL (ref >39.00)
LDL Cholesterol: 128 mg/dL — ABNORMAL HIGH (ref 0–99)
NONHDL: 148.4
TRIGLYCERIDES: 100 mg/dL (ref 0.0–149.0)
Total CHOL/HDL Ratio: 3
VLDL: 20 mg/dL (ref 0.0–40.0)

## 2014-12-05 MED ORDER — AMLODIPINE BESYLATE 5 MG PO TABS
5.0000 mg | ORAL_TABLET | Freq: Every day | ORAL | Status: DC
Start: 2014-12-05 — End: 2015-07-06

## 2014-12-05 NOTE — Patient Instructions (Signed)
Increase amlodipine to 5 mg daily. You can take 2 of your 2.5mg  tablets daily at the same time and use your current supply.  Your physician recommends that you return for lab work today--lipid profile.  Your physician has requested that you regularly monitor and record your blood pressure readings at home. Please use the same machine at the same time of day to check your readings and record them. I will call you in about 2 weeks to get the readings.   Your physician wants you to follow-up in: 6 months with Dr Aundra Dubin. (July 2016). You will receive a reminder letter in the mail two months in advance. If you don't receive a letter, please call our office to schedule the follow-up appointment.

## 2014-12-06 NOTE — Progress Notes (Addendum)
Patient ID: Erika Brown, female   DOB: 1941/02/18, 74 y.o.   MRN: [id number] PCP: Dr. Reynaldo Minium  74 yo with history of CAD s/p NSTEMI in 4/06, hyperlipidemia, and HTN presents for cardiology followup.  Since last appointment, she stopped pravastatin due to leg weakness (has had this with several statins).  She has had no exertional dyspnea or chest pain.   Main concern recently has been a rise in her blood pressure.  She reports a high sodium diet over the holidays.  She has also had several family concerns that have raised her anxiety level.  On 1/5, she felt a "fullness" in her head and found her BP to be very high, up to 213/133.  BP had been gradually rising over the prior 3 days.  She went to the ER.  She was started on amlodipine 2.5 mg daily and sent home.  She feels better.  No further head "fullness."  SBP now running in th 130s-140s when she checks it.   Labs (3/13): K 3.7, creatinine 0.8, LDL 130, HDL 37, TSH normal Labs (6/13): K 3.4, creatinine 0.8, LDL 86, HDL 77 Labs (1/16): K 3.2, creatinine 0.95, HCT 43.4  ECG: NSR, normal  PMH: 1. Hyperlipidemia: Intolerant to Lipitor and pravastatin due to myalgias.  Resistant to starting other statins or Zetia.  2. Anxiety 3. HTN 4. CAD: NSTEMI 4/06 with Cypher DES to LAD and RCA and PTCA to diagonal.  Last myoview in 10/09 with EF 71%, and small area of ischemia at the inferior base.   5. Shellfish allergy 6. Impared fasting glucose  SH: Married with 7 children.  Lives in [place]. Nonsmoker.   FH: No premature CAD  ROS: All systems reviewed and negative except as per HPI.   Current Outpatient Prescriptions  Medication Sig Dispense Refill  . aspirin 81 MG EC tablet Take 81 mg by mouth daily.      [name] BIOTIN PO Take 1 tablet by mouth daily.     . clopidogrel (PLAVIX) 75 MG tablet TAKE 1 TABLET ONCE DAILY. 30 tablet 11  . Coenzyme Q10 (CO Q 10 PO) Take 1 tablet by mouth daily.     . ergocalciferol (VITAMIN D2) 50000 UNITS capsule  Take 50,000 Units by mouth once a week. On Friday    . metoprolol succinate (TOPROL-XL) 50 MG 24 hr tablet Take 1 tablet (50 mg total) by mouth daily. Take with or immediately following a meal. 30 tablet 1  . NITROSTAT 0.4 MG SL tablet DISSOLVE 1 TABLET UNDER TONGUE AS NEEDED FOR CHEST PAIN,MAY REPEAT IN5 MINUTES UP TO 3 DOSES. 25 tablet 3  . potassium chloride SA (K-DUR,KLOR-CON) 20 MEQ tablet TAKE 1 TABLET ONCE DAILY. 30 tablet 11  . valsartan-hydrochlorothiazide (DIOVAN-HCT) 320-25 MG per tablet Take 1 tablet by mouth daily. 30 tablet 1  . amLODipine (NORVASC) 5 MG tablet Take 1 tablet (5 mg total) by mouth daily. 90 tablet 1   No current facility-administered medications for this visit.   BP 130/72 mmHg  Pulse 72  Ht 5\' 5"  (1.651 m)  Wt 151 lb (68.493 kg)  BMI 25.13 kg/m2  SpO2 95%  LMP 11/29/1974 General: NAD Neck: No JVD, no thyromegaly or thyroid nodule.  Lungs: Clear to auscultation bilaterally with normal respiratory effort. CV: Nondisplaced PMI.  Heart regular S1/S2, no S3/S4, no murmur.  No peripheral edema.  No carotid bruit.  Normal pedal pulses.  Abdomen: Soft, nontender, no hepatosplenomegaly, no distention.  Neurologic: Alert and oriented x 3.  Psych: Normal affect. Extremities: No clubbing or cyanosis.   Assessment/Plan:  CAD, NATIVE VESSEL No ischemic symptoms. Good exercise tolerance. Continue Toprol XL, ARB, Plavix, and ASA 81 mg daily.  HYPERLIPIDEMIA She did not tolerate pravastatin due to leg weakness.  She has failed several other statins.  I will check her lipids today.  I talked to her about starting either Zetia or a PCSK9-inhibitor.  I will discuss sending her to the lipid clinic once I get her lipid results back.  HYPERTENSION BP very high recently in setting of increased anxiety and sodium load over the holidays.   - I will have her go ahead and increase amlodipine to 5 mg daily.   - BP check in 2 wks.  - She will work on cutting back sodium in her  diet.   [name] 12/06/2014

## 2014-12-17 ENCOUNTER — Ambulatory Visit: Payer: Medicare Other | Admitting: Cardiology

## 2014-12-19 ENCOUNTER — Telehealth: Payer: Self-pay | Admitting: *Deleted

## 2014-12-19 NOTE — Telephone Encounter (Signed)
Will forward to Dr McLean for review. 

## 2014-12-19 NOTE — Telephone Encounter (Signed)
Recent BP readings:  12/19/14  116/68 65 12/18/14  120/66 61 12/17/14  114/74 64 12/16/14  119/73 61 12/15/14  122/78 73 12/14/14 132/75 58 12/13/14 125/72 68 12/12/14 125/68 67 12/11/14 130/77 68 12/10/14 124/76 77 -later the same day --94/59 75  12/09/14 124/70 12/08/14 129/87 69 12/07/14  136/79  Pt states she has some occasional tiredness/fatigue but otherwise tolerating amlodipine without problems.

## 2014-12-19 NOTE — Telephone Encounter (Signed)
Pt.notified

## 2014-12-19 NOTE — Telephone Encounter (Signed)
Copied from Dr Claris Gladden 12/06/14 office note:  HYPERTENSION BP very high recently in setting of increased anxiety and sodium load over the holidays.  - I will have her go ahead and increase amlodipine to 5 mg daily.  - BP check in 2 wks.  - She will work on cutting back sodium in her diet.

## 2014-12-19 NOTE — Telephone Encounter (Signed)
BP much better

## 2015-01-01 ENCOUNTER — Other Ambulatory Visit: Payer: Self-pay | Admitting: Cardiology

## 2015-03-06 ENCOUNTER — Other Ambulatory Visit: Payer: Self-pay | Admitting: Dermatology

## 2015-05-30 ENCOUNTER — Telehealth: Payer: Self-pay | Admitting: Cardiology

## 2015-05-30 NOTE — Telephone Encounter (Signed)
I spoke with the patient and she is aware of Dr. Claris Gladden recommendations.

## 2015-05-30 NOTE — Telephone Encounter (Signed)
I spoke with the patient. She has been having low BP readings over the last few weeks- on 6/18 she was 98/65 HR- 63 and then readings as below.  She is currently on amlodipine 5 mg daily, toprol 50 mg daily, and valsartan/hct 320/25 mg daily. She denies dizziness/ lightheadedness, but is feeling more tired and sleepy at this point. Most recent medication change was in January, when amlodipine was increased to 5 mg once daily. She reports that she is staying hydrated with water, but that she is very active outside. She is due to travel to [place] on 06/03/15 for her daughter's wedding. I have advised her to decrease amlodipine to 2.5 mg once daily and monitor her BP until she goes out of town.  I have also advised if she is out of the country and continues to feel like her BP is low, she can try cutting the metoprolol in 1/2 for the short term. She will resume monitoring her BP once she returns (she will be gone about a week). She was reminded to stay well hydrated. Will forward to Dr. Aundra Dubin as an Juluis Rainier.

## 2015-05-30 NOTE — Telephone Encounter (Signed)
Per pt   Pt stated BP is low and would like to cut meds.   7/1  105/61  63  7a  6/30   97/59   65   5p  6/29   119/70    65     6/28   108/69     68     6/27   122/67   64  6/25 118/64    59

## 2015-05-30 NOTE — Telephone Encounter (Signed)
Agree, can decrease amlodipine to 2.5 daily.  If BP remains low, stop amlodipine altogether.

## 2015-06-12 ENCOUNTER — Ambulatory Visit (INDEPENDENT_AMBULATORY_CARE_PROVIDER_SITE_OTHER): Payer: Medicare Other

## 2015-06-12 ENCOUNTER — Ambulatory Visit (INDEPENDENT_AMBULATORY_CARE_PROVIDER_SITE_OTHER): Payer: Medicare Other | Admitting: Family Medicine

## 2015-06-12 VITALS — BP 132/80 | HR 80 | Temp 101.3°F | Resp 18 | Ht 65.0 in | Wt 152.6 lb

## 2015-06-12 DIAGNOSIS — R8290 Unspecified abnormal findings in urine: Secondary | ICD-10-CM

## 2015-06-12 DIAGNOSIS — J988 Other specified respiratory disorders: Secondary | ICD-10-CM | POA: Diagnosis not present

## 2015-06-12 DIAGNOSIS — R05 Cough: Secondary | ICD-10-CM

## 2015-06-12 DIAGNOSIS — R5081 Fever presenting with conditions classified elsewhere: Secondary | ICD-10-CM

## 2015-06-12 DIAGNOSIS — R059 Cough, unspecified: Secondary | ICD-10-CM

## 2015-06-12 DIAGNOSIS — J22 Unspecified acute lower respiratory infection: Secondary | ICD-10-CM

## 2015-06-12 LAB — POCT URINALYSIS DIPSTICK
Bilirubin, UA: NEGATIVE
Glucose, UA: NEGATIVE
Ketones, UA: NEGATIVE
Nitrite, UA: NEGATIVE
Spec Grav, UA: 1.01
Urobilinogen, UA: 0.2
pH, UA: 6

## 2015-06-12 LAB — POCT CBC
Granulocyte percent: 86 % — AB (ref 37–80)
HCT, POC: 37.1 % — AB (ref 37.7–47.9)
Hemoglobin: 12.2 g/dL (ref 12.2–16.2)
Lymph, poc: 0.7 (ref 0.6–3.4)
MCH, POC: 26.9 pg — AB (ref 27–31.2)
MCHC: 33 g/dL (ref 31.8–35.4)
MCV: 81.6 fL (ref 80–97)
MID (cbc): 0.3 (ref 0–0.9)
MPV: 6.4 fL (ref 0–99.8)
POC Granulocyte: 5.9 (ref 2–6.9)
POC LYMPH PERCENT: 10.2 %L (ref 10–50)
POC MID %: 3.8 %M (ref 0–12)
Platelet Count, POC: 184 10*3/uL (ref 142–424)
RBC: 4.54 M/uL (ref 4.04–5.48)
RDW, POC: 13 %
WBC: 6.9 10*3/uL (ref 4.6–10.2)

## 2015-06-12 LAB — POCT UA - MICROSCOPIC ONLY
Casts, Ur, LPF, POC: NEGATIVE
Crystals, Ur, HPF, POC: NEGATIVE
Yeast, UA: NEGATIVE

## 2015-06-12 LAB — POCT RAPID STREP A (OFFICE): Rapid Strep A Screen: NEGATIVE

## 2015-06-12 LAB — POCT INFLUENZA A/B
Influenza A, POC: NEGATIVE
Influenza B, POC: NEGATIVE

## 2015-06-12 MED ORDER — AZITHROMYCIN 250 MG PO TABS: ORAL_TABLET | ORAL | Status: DC

## 2015-06-12 MED ORDER — CEFTRIAXONE SODIUM 1 G IJ SOLR
1.0000 g | Freq: Once | INTRAMUSCULAR | Status: AC
  Administered 2015-06-12: 1 g via INTRAMUSCULAR

## 2015-06-12 NOTE — Progress Notes (Signed)
 Chief Complaint:  Chief Complaint  Patient presents with  . Fever    since Wednesday  . Cough  . Generalized Body Aches  . Chills  . Sore Throat    HPI: Erika Brown is a 74 y.o. female who reports to [facility] today complaining of  recent chills since last night, generalized body aches, fever. T max was 101.3, associated with Sore throat. She was recently on a trip from [place] and came back on an day. There are a lot of sick children on the plane. Her husband is at [facility] with pneumonia.She has had a temp, has nota taken anything for this except tylenol here. She  Has had an MI, EF was 40 based on EPIC . No QT prolongation. She has no leg pain or assymetrical swelling , was walking on plane.   Past Medical History  Diagnosis Date  . CAD (coronary artery disease)     S/P NSTEMI 4/06 EF 60%; Treated with Cypher DES to LAD and RCA with kissing balloon PCI diagonal; Treadmill myoview 10/09 for CP: Walked 7:06. mild ST-T-wave changes in recovery. EF 71%. Very mild reversible defect in base of the inferior wall --> medical rx.  . Hypertension     Possible white-coat component  . Hyperlipidemia     Intolerate to multiple statins due to severe myalgias  . Glucose intolerance (impaired glucose tolerance)   . History of medication noncompliance   . Depression   . Microhematuria   . MI (myocardial infarction)     acute-s/p stent  . Vitamin D deficiency   . Heart murmur   . Psoriasis   . Anxiety and depression    Past Surgical History  Procedure Laterality Date  . Coronary stent placement    . Appendectomy    . Abdominal hysterectomy      and anterior repair  . Mohs surgery  9/14    [facility].  [facility]  . Breast biopsy    . Tonsillectomy and adenoidectomy    . Bladder tact     History   Social History  . Marital Status: Married    Spouse Name: N/A  . Number of Children: 7  . Years of Education: N/A   Occupational History  . Works at home, Charity fundraiser, [employer] Top[employer]  . Smoking status: Former Research scientist (life sciences)  . Smokeless tobacco: Never Used  . Alcohol Use: 2.4 oz/week    4 Glasses of wine per week     Comment: glass of wine at dinner most nights  . Drug Use: No  . Sexual Activity: Not Currently     Comment: TVH   Other Topics Concern  . None   Social History Narrative   Lives in [place] with her husband   Seven kids   Plays tennis   Very good card player   Family History  Problem Relation Age of Onset  . Heart disease Father   . Heart attack Father   . Coronary artery disease Neg Hx   . Melanoma Child     Metastatic  . Hypertension Mother   . Cancer Daughter     melanoma  . Stroke Maternal Grandmother   . Stroke Maternal Grandfather   . Diabetes Paternal Grandmother   . Melanoma Mother   . Breast cancer Sister 27  . Hypertension Brother   . Asthma Brother   . Ulcers Father   . Renal Disease Maternal Grandfather  kidney removed  . Depression Daughter     x2  . Rectal cancer Son    Allergies  Allergen Reactions  . Iodine     Has had some SHOB and rapid heart rate in the past  . Latex   . Other     Some foods b/c of preservatives   . Statins     Intolerance to high dosages, myalgia   Prior to Admission medications   Medication Sig Start Date End Date Taking? Authorizing Provider  amLODipine (NORVASC) 5 MG tablet Take 1 tablet (5 mg total) by mouth daily. Patient taking differently: Take 2.5 mg by mouth daily.  12/05/14  Yes Larey Dresser, MD  aspirin 81 MG EC tablet Take 81 mg by mouth daily.     Yes Historical Provider, MD  BIOTIN PO Take 1 tablet by mouth daily.    Yes Historical Provider, MD  clopidogrel (PLAVIX) 75 MG tablet TAKE 1 TABLET ONCE DAILY. 09/05/14  Yes Larey Dresser, MD  Coenzyme Q10 (CO Q 10 PO) Take 1 tablet by mouth daily.    Yes Historical Provider, MD  ergocalciferol (VITAMIN D2) 50000 UNITS capsule Take 50,000 Units by mouth once a week. On Friday   Yes  Historical Provider, MD  metoprolol succinate (TOPROL-XL) 50 MG 24 hr tablet TAKE 1 TABLET ONCE DAILY WITH OR IMMEDIATELY FOLLOWING A MEAL. 01/02/15  Yes Larey Dresser, MD  NITROSTAT 0.4 MG SL tablet DISSOLVE 1 TABLET UNDER TONGUE AS NEEDED FOR CHEST PAIN,MAY REPEAT IN5 MINUTES UP TO 3 DOSES.   Yes Larey Dresser, MD  potassium chloride SA (K-DUR,KLOR-CON) 20 MEQ tablet TAKE 1 TABLET ONCE DAILY. 09/05/14  Yes Larey Dresser, MD  valsartan-hydrochlorothiazide (DIOVAN-HCT) 320-25 MG per tablet TAKE 1 TABLET ONCE DAILY. 01/02/15  Yes Larey Dresser, MD     ROS: The patient denies night sweats, unintentional weight loss, chest pain, palpitations, wheezing, dyspnea on exertion, nausea, vomiting, abdominal pain, dysuria, hematuria, melena, numbness, weakness, or tingling.   All other systems have been reviewed and were otherwise negative with the exception of those mentioned in the HPI and as above.    PHYSICAL EXAM: Filed Vitals:   06/12/15 1836  BP: 132/80  Pulse: 111  Temp: 101.3 F (38.5 C)  Resp: 18   Body mass index is 25.39 kg/(m^2).   General: Alert, no acute distress HEENT:  Normocephalic, atraumatic, oropharynx patent. EOMI, PERRLA TM normal  Cardiovascular:  Regular rate and rhythm, no rubs murmurs or gallops.  No Carotid bruits, radial pulse intact. No pedal edema.  Respiratory: Clear to auscultation bilaterally.  No wheezes, rales, or rhonchi.  No cyanosis, no use of accessory musculature Abdominal: No organomegaly, abdomen is soft and non-tender, positive bowel sounds. No masses. Skin: No rashes. Neurologic: Facial musculature symmetric. Psychiatric: Patient acts appropriately throughout our interaction. Lymphatic: No cervical or submandibular lymphadenopathy Musculoskeletal: Gait intact. No edema, tenderness Neg Homan , no eo DVT    LABS: Results for orders placed or performed in visit on 06/12/15  POCT CBC  Result Value Ref Range   WBC 6.9 4.6 - 10.2 K/uL   Lymph,  poc 0.7 0.6 - 3.4   POC LYMPH PERCENT 10.2 10 - 50 %L   MID (cbc) 0.3 0 - 0.9   POC MID % 3.8 0 - 12 %M   POC Granulocyte 5.9 2 - 6.9   Granulocyte percent 86.0 (A) 37 - 80 %G   RBC 4.54 4.04 - 5.48 M/uL  Hemoglobin 12.2 12.2 - 16.2 g/dL   HCT, POC 37.1 (A) 37.7 - 47.9 %   MCV 81.6 80 - 97 fL   MCH, POC 26.9 (A) 27 - 31.2 pg   MCHC 33.0 31.8 - 35.4 g/dL   RDW, POC 13.0 %   Platelet Count, POC 184 142 - 424 K/uL   MPV 6.4 0 - 99.8 fL  POCT UA - Microscopic Only  Result Value Ref Range   WBC, Ur, HPF, POC 5-10    RBC, urine, microscopic 5-10    Bacteria, U Microscopic 2+    Mucus, UA 2+    Epithelial cells, urine per micros 5-10    Crystals, Ur, HPF, POC negative    Casts, Ur, LPF, POC negative    Yeast, UA negative   POCT urinalysis dipstick  Result Value Ref Range   Color, UA yellow    Clarity, UA clear    Glucose, UA negative    Bilirubin, UA negative    Ketones, UA negative    Spec Grav, UA 1.010    Blood, UA moderate    pH, UA 6.0    Protein, UA 1+    Urobilinogen, UA 0.2    Nitrite, UA negative    ukocytes, UA small (1+) (A) Negative  POCT Influenza A/B  Result Value Ref Range   Influenza A, POC Negative Negative   Influenza B, POC Negative Negative  POCT rapid strep A  Result Value Ref Range   Rapid Strep A Screen Negative Negative     EKG/XRAY:   Primary read interpreted by Dr. Marin Comment at [facility]. Please commentif there is a small lower right lobe infiltrate or just increase vascualr markings, left haziness was present in prior xray    ASSESSMENT/PLAN: Encounter Diagnoses  Name Primary?  . Fever presenting with conditions classified elsewhere   . Cough   . Abnormal urine   . Lower respiratory infection (e.g., bronchitis, pneumonia, pneumonitis, pulmonitis) Yes   Pleasant 74 y/o Female with HTN, CAD, MI , EF 71 % who is here for flu like sxs  And husband dx with PNA at [facility]  She most likely has acute bronchitis.  Urine cutlure pending if patient able to  submit urine by tomorrow before she takes z pack, however rocephin 1 gram should have also taken care of it CBC and CXR reassuring Z pack given  Tylenol for fever prn, push fluids FU prn   Gross sideeffects, risk and benefits, and alternatives of medications d/w patient. Patient is aware that all medications have potential sideeffects and we are unable to predict every sideeffect or drug-drug interaction that may occur.    DO  06/12/2015 8:24 PM

## 2015-06-12 NOTE — Patient Instructions (Signed)
Please make sure your blood pressure is ok ,if your blood pressure is low less than 110/60 then please do not take the diovan hctz just take the metoprolol Monitor for worsening symptoms, take and a bland diet and push fluids. You have what is most likely a lower respiratory infection probably a viral infection but we will go ahead and cover for bacterial respiratory infection as well with the Z-Pak.  Acute Bronchitis Bronchitis is inflammation of the airways that extend from the windpipe into the lungs (bronchi). The inflammation often causes mucus to develop. This leads to a cough, which is the most common symptom of bronchitis.  In acute bronchitis, the condition usually develops suddenly and goes away over time, usually in a couple weeks. Smoking, allergies, and asthma can make bronchitis worse. Repeated episodes of bronchitis may cause further lung problems.  CAUSES Acute bronchitis is most often caused by the same virus that causes a cold. The virus can spread from person to person (contagious) through coughing, sneezing, and touching contaminated objects. SIGNS AND SYMPTOMS   Cough.   Fever.   Coughing up mucus.   Body aches.   Chest congestion.   Chills.   Shortness of breath.   Sore throat.  DIAGNOSIS  Acute bronchitis is usually diagnosed through a physical exam. Your health care provider will also ask you questions about your medical history. Tests, such as chest X-rays, are sometimes done to rule out other conditions.  TREATMENT  Acute bronchitis usually goes away in a couple weeks. Oftentimes, no medical treatment is necessary. Medicines are sometimes given for relief of fever or cough. Antibiotic medicines are usually not needed but may be prescribed in certain situations. In some cases, an inhaler may be recommended to help reduce shortness of breath and control the cough. A cool mist vaporizer may also be used to help thin bronchial secretions and make it easier  to clear the chest.  HOME CARE INSTRUCTIONS  Get plenty of rest.   Drink enough fluids to keep your urine clear or pale yellow (unless you have a medical condition that requires fluid restriction). Increasing fluids may help thin your respiratory secretions (sputum) and reduce chest congestion, and it will prevent dehydration.   Take medicines only as directed by your health care provider.  If you were prescribed an antibiotic medicine, finish it all even if you start to feel better.  Avoid smoking and secondhand smoke. Exposure to cigarette smoke or irritating chemicals will make bronchitis worse. If you are a smoker, consider using nicotine gum or skin patches to help control withdrawal symptoms. Quitting smoking will help your lungs heal faster.   Reduce the chances of another bout of acute bronchitis by washing your hands frequently, avoiding people with cold symptoms, and trying not to touch your hands to your mouth, nose, or eyes.   Keep all follow-up visits as directed by your health care provider.  SEEK MEDICAL CARE IF: Your symptoms do not improve after 1 week of treatment.  SEEK IMMEDIATE MEDICAL CARE IF:  You develop an increased fever or chills.   You have chest pain.   You have severe shortness of breath.  You have bloody sputum.   You develop dehydration.  You faint or repeatedly feel like you are going to pass out.  You develop repeated vomiting.  You develop a severe headache. MAKE SURE YOU:   Understand these instructions.  Will watch your condition.  Will get help right away if you are not doing  well or get worse. Document Released: 12/23/2004 Document Revised: 04/01/2014 Document Reviewed: 05/08/2013 [facility] Patient Information 2015 [place], [place]. This information is not intended to replace advice given to you by your health care provider. Make sure you discuss any questions you have with your health care provider. Azithromycin tablets What  is this medicine? AZITHROMYCIN (az ith roe MYE sin) is a macrolide antibiotic. It is used to treat or prevent certain kinds of bacterial infections. It will not work for colds, flu, or other viral infections. This medicine may be used for other purposes; ask your health care provider or pharmacist if you have questions. COMMON BRAND NAME(S): Zithromax, Zithromax Tri-Pak, Zithromax Z-Pak What should I tell my health care provider before I take this medicine? They need to know if you have any of these conditions: -kidney disease -liver disease -irregular heartbeat or heart disease -an unusual or allergic reaction to azithromycin, erythromycin, other macrolide antibiotics, foods, dyes, or preservatives -pregnant or trying to get pregnant -breast-feeding How should I use this medicine? Take this medicine by mouth with a full glass of water. Follow the directions on the prescription label. The tablets can be taken with food or on an empty stomach. If the medicine upsets your stomach, take it with food. Take your medicine at regular intervals. Do not take your medicine more often than directed. Take all of your medicine as directed even if you think your are better. Do not skip doses or stop your medicine early. Talk to your pediatrician regarding the use of this medicine in children. Special care may be needed. Overdosage: If you think you have taken too much of this medicine contact a poison control center or emergency room at once. NOTE: This medicine is only for you. Do not share this medicine with others. What if I miss a dose? If you miss a dose, take it as soon as you can. If it is almost time for your next dose, take only that dose. Do not take double or extra doses. What may interact with this medicine? Do not take this medicine with any of the following medications: -lincomycin This medicine may also interact with the following medications: -amiodarone -antacids -birth control  pills -cyclosporine -digoxin -magnesium -nelfinavir -phenytoin -warfarin This list may not describe all possible interactions. Give your health care provider a list of all the medicines, herbs, non-prescription drugs, or dietary supplements you use. Also tell them if you smoke, drink alcohol, or use illegal drugs. Some items may interact with your medicine. What should I watch for while using this medicine? Tell your doctor or health care professional if your symptoms do not improve. Do not treat diarrhea with over the counter products. Contact your doctor if you have diarrhea that lasts more than 2 days or if it is severe and watery. This medicine can make you more sensitive to the sun. Keep out of the sun. If you cannot avoid being in the sun, wear protective clothing and use sunscreen. Do not use sun lamps or tanning beds/booths. What side effects may I notice from receiving this medicine? Side effects that you should report to your doctor or health care professional as soon as possible: -allergic reactions like skin rash, itching or hives, swelling of the face, lips, or tongue -confusion, nightmares or hallucinations -dark urine -difficulty breathing -hearing loss -irregular heartbeat or chest pain -pain or difficulty passing urine -redness, blistering, peeling or loosening of the skin, including inside the mouth -white patches or sores in the mouth -yellowing  of the eyes or skin Side effects that usually do not require medical attention (report to your doctor or health care professional if they continue or are bothersome): -diarrhea -dizziness, drowsiness -headache -stomach upset or vomiting -tooth discoloration -vaginal irritation This list may not describe all possible side effects. Call your doctor for medical advice about side effects. You may report side effects to FDA at 1-800-FDA-1088. Where should I keep my medicine? Keep out of the reach of children. Store at room  temperature between 15 and 30 degrees C (59 and 86 degrees F). Throw away any unused medicine after the expiration date. NOTE: This sheet is a summary. It may not cover all possible information. If you have questions about this medicine, talk to your doctor, pharmacist, or health care provider.  2015, Elsevier/Gold Standard. (2013-06-21 15:38:48)

## 2015-06-15 ENCOUNTER — Telehealth: Payer: Self-pay | Admitting: *Deleted

## 2015-06-15 NOTE — Addendum Note (Signed)
Addended by: Kem Boroughs D on: 06/15/2015 12:13 PM   Modules accepted: Orders

## 2015-06-15 NOTE — Telephone Encounter (Signed)
Called pt about bringing back urine.  She was advised by Dr. Marin Comment not to bring it back if she had started the medication.  She also had some concerns about rattling in her chest and cough.  Advised her since this is only the second day of medication to call back on Tuesday if rattling is not gone or cough is worse.

## 2015-07-06 ENCOUNTER — Other Ambulatory Visit: Payer: Self-pay | Admitting: Cardiology

## 2015-08-05 ENCOUNTER — Other Ambulatory Visit: Payer: Self-pay | Admitting: Cardiology

## 2015-08-13 ENCOUNTER — Other Ambulatory Visit: Payer: Self-pay | Admitting: Cardiology

## 2015-08-29 ENCOUNTER — Encounter: Payer: Self-pay | Admitting: Cardiology

## 2015-08-29 ENCOUNTER — Ambulatory Visit (INDEPENDENT_AMBULATORY_CARE_PROVIDER_SITE_OTHER): Payer: Medicare Other | Admitting: Cardiology

## 2015-08-29 VITALS — BP 134/82 | HR 96 | Ht 65.0 in | Wt 146.0 lb

## 2015-08-29 DIAGNOSIS — E785 Hyperlipidemia, unspecified: Secondary | ICD-10-CM | POA: Diagnosis not present

## 2015-08-29 DIAGNOSIS — I10 Essential (primary) hypertension: Secondary | ICD-10-CM | POA: Diagnosis not present

## 2015-08-29 DIAGNOSIS — I2510 Atherosclerotic heart disease of native coronary artery without angina pectoris: Secondary | ICD-10-CM | POA: Diagnosis not present

## 2015-08-29 NOTE — Patient Instructions (Signed)
Medication Instructions:  None today  Labwork: None today  Testing/Procedures: None today  Follow-Up: Your physician wants you to follow-up in: 6 months with Kathrene Alu or Versie Starks (March 2017).  You will receive a reminder letter in the mail two months in advance. If you don't receive a letter, please call our office to schedule the follow-up appointment.   Your physician wants you to follow-up in: 1 year with Dr Aundra Dubin. (September 2017).  You will receive a reminder letter in the mail two months in advance. If you don't receive a letter, please call our office to schedule the follow-up appointment.

## 2015-08-31 NOTE — Progress Notes (Signed)
Patient ID: Erika Brown, female   DOB: February 21, 1941, 74 y.o.   MRN: [id number] PCP: Dr. Reynaldo Minium  74 yo with history of CAD s/p NSTEMI in 4/06, hyperlipidemia, and HTN presents for cardiology followup.  She has had no exertional dyspnea or chest pain. At last appointment, she was under a lot of stress, and BP was running higher.  We adjusted her medications and BP appears to be better controlled.  Weight is down 5 lbs.  Still a lot of stress caring for her husband who has dementia.  Labs (3/13): K 3.7, creatinine 0.8, LDL 130, HDL 37, TSH normal Labs (6/13): K 3.4, creatinine 0.8, LDL 86, HDL 77 Labs (1/16): K 3.2, creatinine 0.95, HCT 43.4, LDL 128, HDL 78  ECG: NSR, normal  PMH: 1. Hyperlipidemia: Intolerant to Lipitor and pravastatin due to myalgias.  Resistant to starting other statins or Zetia.  2. Anxiety 3. HTN 4. CAD: NSTEMI 4/06 with Cypher DES to LAD and RCA and PTCA to diagonal.  Last myoview in 10/09 with EF 71%, and small area of ischemia at the inferior base.   5. Shellfish allergy 6. Impaired fasting glucose  SH: Married with 7 children.  Lives in [place]. Nonsmoker.  Husband has dementia.   FH: No premature CAD  ROS: All systems reviewed and negative except as per HPI.   Current Outpatient Prescriptions  Medication Sig Dispense Refill  . amLODipine (NORVASC) 5 MG tablet TAKE 1 TABLET EACH DAY. 90 tablet 0  . aspirin 81 MG EC tablet Take 81 mg by mouth daily.      [name] azithromycin (ZITHROMAX) 250 MG tablet Take 2 tabs po now then 1 tab po daily for the next 4 days 6 tablet 0  . BIOTIN PO Take 1 tablet by mouth as needed.     . clopidogrel (PLAVIX) 75 MG tablet TAKE 1 TABLET ONCE DAILY. 90 tablet 0  . Coenzyme Q10 (CO Q 10 PO) Take 1 tablet by mouth as needed.     . ergocalciferol (VITAMIN D2) 50000 UNITS capsule Take 50,000 Units by mouth once a week. On Friday    . metoprolol succinate (TOPROL-XL) 50 MG 24 hr tablet Take 1 tablet (50 mg total) by mouth daily. 90  tablet 0  . NITROSTAT 0.4 MG SL tablet DISSOLVE 1 TABLET UNDER TONGUE AS NEEDED FOR CHEST PAIN,MAY REPEAT IN5 MINUTES UP TO 3 DOSES. 25 tablet 3  . potassium chloride SA (K-DUR,KLOR-CON) 20 MEQ tablet TAKE 1 TABLET ONCE DAILY. 90 tablet 0  . valsartan-hydrochlorothiazide (DIOVAN-HCT) 320-25 MG per tablet Take 1 tablet by mouth daily. 90 tablet 0   No current facility-administered medications for this visit.   BP 134/82 mmHg  Pulse 96  Ht 5\' 5"  (1.651 m)  Wt 146 lb (66.225 kg)  BMI 24.30 kg/m2  LMP 11/29/1974 General: NAD Neck: No JVD, no thyromegaly or thyroid nodule.  Lungs: Clear to auscultation bilaterally with normal respiratory effort. CV: Nondisplaced PMI.  Heart regular S1/S2, no S3/S4, no murmur.  No peripheral edema.  No carotid bruit.  Normal pedal pulses.  Abdomen: Soft, nontender, no hepatosplenomegaly, no distention.  Neurologic: Alert and oriented x 3.  Psych: Normal affect. Extremities: No clubbing or cyanosis.   Assessment/Plan:  CAD, NATIVE VESSEL No ischemic symptoms. Good exercise tolerance. Continue Toprol XL, ARB, Plavix, and ASA 81 mg daily.  HYPERLIPIDEMIA She has not tolerated multiple statins due to myalgias.  She does not want to go on Zetia.  I talked to her about  considering PCSK9-inhibitors, but she is not interested.  Therefore, I do not think that there is any utility to a lipid clinic referral. HYPERTENSION BP better, continue current meds.   Followup with PA in 6 months and me in 1 year.   [name] 08/31/2015

## 2015-10-10 ENCOUNTER — Other Ambulatory Visit: Payer: Self-pay | Admitting: Cardiology

## 2015-11-01 ENCOUNTER — Other Ambulatory Visit: Payer: Self-pay | Admitting: Cardiology

## 2015-11-09 ENCOUNTER — Other Ambulatory Visit: Payer: Self-pay | Admitting: Cardiology

## 2015-11-30 HISTORY — PX: CATARACT EXTRACTION: SUR2

## 2015-12-04 ENCOUNTER — Ambulatory Visit: Payer: Medicare Other | Admitting: Family Medicine

## 2016-02-16 ENCOUNTER — Encounter: Payer: Self-pay | Admitting: Internal Medicine

## 2016-02-27 ENCOUNTER — Encounter: Payer: Self-pay | Admitting: Nurse Practitioner

## 2016-02-27 ENCOUNTER — Ambulatory Visit (INDEPENDENT_AMBULATORY_CARE_PROVIDER_SITE_OTHER): Payer: PPO | Admitting: Nurse Practitioner

## 2016-02-27 VITALS — BP 136/70 | HR 84 | Ht 65.5 in | Wt 146.4 lb

## 2016-02-27 DIAGNOSIS — I10 Essential (primary) hypertension: Secondary | ICD-10-CM | POA: Diagnosis not present

## 2016-02-27 DIAGNOSIS — I2510 Atherosclerotic heart disease of native coronary artery without angina pectoris: Secondary | ICD-10-CM | POA: Diagnosis not present

## 2016-02-27 DIAGNOSIS — E785 Hyperlipidemia, unspecified: Secondary | ICD-10-CM

## 2016-02-27 NOTE — Patient Instructions (Addendum)
We will be checking the following labs today - NONE  Fasting BMET, CBC, HPF and lipids next week   Medication Instructions:    Continue with your current medicines.     Testing/Procedures To Be Arranged:  N/A  Follow-Up:   See Dr. Aundra Dubin in 6 months.     Other Special Instructions:   N/A    If you need a refill on your cardiac medications before your next appointment, please call your pharmacy.   Call the [place] office at [phone] if you have any questions, problems or concerns.

## 2016-02-27 NOTE — Progress Notes (Signed)
CARDIOLOGY OFFICE NOTE  Date:  02/27/2016    Erika Brown Date of Birth: 1941-08-01 Medical Record [id number]  PCP:  Geoffery Lyons, MD  Cardiologist:  Aundra Dubin    Chief Complaint  Patient presents with  . Coronary Artery Disease  . Hypertension  . Hyperlipidemia    Follow up visit - seen for Dr. Aundra Dubin    History of Present Illness: Erika Brown is a 75 y.o. female who presents today for a follow up visit. Seen for Dr. Aundra Dubin.   She has a history of known CAD s/p NSTEMI in 4/06 with PCI to the LAD and RCA along with PTCA to the diagonal, hyperlipidemia, and HTN.  Last Myoview in 2009 with EF 71% and small area of ischemia at the inferior base. She has been managed medically.   Last seen in September and felt to be doing ok. Lots of stress with caring for husband with dementia.   Comes back today. Here alone.  She is pretty happy with how she is doing. Feels like her cardiac status is ok. Does have some IBS. Remains active. Plays tennis for about 1 1/2 hours at least once a week. Caring for her husband. No chest pain. Breathing is good. Says she has not had labs in over 2 years. Not fasting today. BP is good. No real concerns.    PMH: 1. Hyperlipidemia: Intolerant to Lipitor and pravastatin due to myalgias. Resistant to starting other statins or Zetia.  2. Anxiety 3. HTN 4. CAD: NSTEMI 4/06 with Cypher DES to LAD and RCA and PTCA to diagonal. Last myoview in 10/09 with EF 71%, and small area of ischemia at the inferior base.  5. Shellfish allergy 6. Impaired fasting glucose  Past Medical History  Diagnosis Date  . CAD (coronary artery disease)     S/P NSTEMI 4/06 EF 60%; Treated with Cypher DES to LAD and RCA with kissing balloon PCI diagonal; Treadmill myoview 10/09 for CP: Walked 7:06. mild ST-T-wave changes in recovery. EF 71%. Very mild reversible defect in base of the inferior wall --> medical rx.  . Hypertension     Possible white-coat component  .  Hyperlipidemia     Intolerate to multiple statins due to severe myalgias  . Glucose intolerance (impaired glucose tolerance)   . History of medication noncompliance   . Depression   . Microhematuria   . MI (myocardial infarction)     acute-s/p stent  . Vitamin D deficiency   . Heart murmur   . Psoriasis   . Anxiety and depression     Past Surgical History  Procedure Laterality Date  . Coronary stent placement    . Appendectomy    . Abdominal hysterectomy      and anterior repair  . Mohs surgery  9/14    [facility].  [facility]  . Breast biopsy    . Tonsillectomy and adenoidectomy    . Bladder tact       Medications: Current Outpatient Prescriptions  Medication Sig Dispense Refill  . amLODipine (NORVASC) 5 MG tablet Take 1 tablet (5 mg total) by mouth daily. 90 tablet 2  . aspirin 81 MG EC tablet Take 81 mg by mouth daily.      . clopidogrel (PLAVIX) 75 MG tablet Take 1 tablet (75 mg total) by mouth daily. 90 tablet 2  . ergocalciferol (VITAMIN D2) 50000 UNITS capsule Take 50,000 Units by mouth once a week. On Friday    . metoprolol succinate (  TOPROL-XL) 50 MG 24 hr tablet Take 1 tablet (50 mg total) by mouth daily. 90 tablet 2  . NITROSTAT 0.4 MG SL tablet DISSOLVE 1 TABLET UNDER TONGUE AS NEEDED FOR CHEST PAIN,MAY REPEAT IN5 MINUTES UP TO 3 DOSES. 25 tablet 3  . potassium chloride SA (K-DUR,KLOR-CON) 20 MEQ tablet Take 1 tablet (20 mEq total) by mouth daily. 90 tablet 2  . valsartan-hydrochlorothiazide (DIOVAN-HCT) 320-25 MG tablet Take 1 tablet by mouth daily. 90 tablet 2   No current facility-administered medications for this visit.    Allergies: Allergies  Allergen Reactions  . Iodine     Has had some SHOB and rapid heart rate in the past  . Latex   . Other     Some foods b/c of preservatives   . Statins     Intolerance to high dosages, myalgia    Social History: The patient  reports that she has quit smoking. She has never used smokeless tobacco. She reports that  she drinks about 2.4 oz of alcohol per week. She reports that she does not use illicit drugs.   Family History: The patient's family history includes Asthma in her brother; Breast cancer (age of onset: 34) in her sister; Cancer in her daughter; Depression in her daughter; Diabetes in her paternal grandmother; Heart attack in her father; Heart disease in her father; Hypertension in her brother and mother; Melanoma in her child and mother; Rectal cancer in her son; Renal Disease in her maternal grandfather; Stroke in her maternal grandfather and maternal grandmother; Ulcers in her father. There is no history of Coronary artery disease.   Review of Systems: Please see the history of present illness.   Otherwise, the review of systems is positive for none.   All other systems are reviewed and negative.   Physical Exam: VS:  BP 136/70 mmHg  Pulse 84  Ht 5' 5.5" (1.664 m)  Wt 146 lb 6.4 oz (66.407 kg)  BMI 23.98 kg/m2  LMP 11/29/1974 .  BMI Body mass index is 23.98 kg/(m^2).  Wt Readings from Last 3 Encounters:  02/27/16 146 lb 6.4 oz (66.407 kg)  08/29/15 146 lb (66.225 kg)  06/12/15 152 lb 9.6 oz (69.219 kg)    General: Pleasant. Well developed, well nourished and in no acute distress.  HEENT: Normal. Neck: Supple, no JVD, carotid bruits, or masses noted.  Cardiac: Regular rate and rhythm. Heart tones are distant.  No edema.  Respiratory:  Lungs are clear to auscultation bilaterally with normal work of breathing.  GI: Soft and nontender.  MS: No deformity or atrophy. Gait and ROM intact. Skin: Warm and dry. Color is normal.  Neuro:  Strength and sensation are intact and no gross focal deficits noted.  Psych: Alert, appropriate and with normal affect.   LABORATORY DATA:  EKG:  EKG is not ordered today.  Lab Results  Component Value Date   WBC 6.9 06/12/2015   HGB 12.2 06/12/2015   HCT 37.1* 06/12/2015   PLT 212 12/02/2014   GLUCOSE 114* 12/02/2014   CHOL 226* 12/05/2014    TRIG 100.0 12/05/2014   HDL 77.60 12/05/2014   LDLDIRECT 121.7 09/08/2007   LDLCALC 128* 12/05/2014   ALT 22 05/16/2012   AST 18 05/16/2012   NA 136 12/02/2014   K 3.2* 12/02/2014   CL 101 12/02/2014   CREATININE 0.95 12/02/2014   BUN 17 12/02/2014   CO2 26 12/02/2014    BNP (last 3 results) No results for input(s): BNP in the  last 8760 hours.  ProBNP (last 3 results) No results for input(s): PROBNP in the last 8760 hours.   Other Studies Reviewed Today:   Assessment/Plan: 1. CAD - doing well - she remains on DAPT. Continue with CV risk factor modification.   2. HTN - BP ok on current regimen. No changes made.   3. HLD - not on therapy - resistant to the idea - needs labs.   4. IBS  Current medicines are reviewed with the patient today.  The patient does not have concerns regarding medicines other than what has been noted above.  The following changes have been made:  See above.  Labs/ tests ordered today include:    Orders Placed This Encounter  Procedures  . Basic metabolic panel  . CBC  . Hepatic function panel  . Lipid panel     Disposition:   FU with Dr. Aundra Dubin in 6 months  .   Patient is agreeable to this plan and will call if any problems develop in the interim.   Signed: Burtis Junes, RN, ANP-C 02/27/2016 10:56 AM  [facility] [address] [place], [place]  [address] Phone: [phone] Fax: [phone]

## 2016-03-02 ENCOUNTER — Other Ambulatory Visit (INDEPENDENT_AMBULATORY_CARE_PROVIDER_SITE_OTHER): Payer: PPO | Admitting: *Deleted

## 2016-03-02 DIAGNOSIS — I2510 Atherosclerotic heart disease of native coronary artery without angina pectoris: Secondary | ICD-10-CM

## 2016-03-02 DIAGNOSIS — I10 Essential (primary) hypertension: Secondary | ICD-10-CM | POA: Diagnosis not present

## 2016-03-02 DIAGNOSIS — E785 Hyperlipidemia, unspecified: Secondary | ICD-10-CM | POA: Diagnosis not present

## 2016-03-02 LAB — CBC WITH DIFFERENTIAL/PLATELET
Basophils Absolute: 0 cells/uL (ref 0–200)
Basophils Relative: 0 %
Eosinophils Absolute: 156 cells/uL (ref 15–500)
Eosinophils Relative: 3 %
HCT: 39.8 % (ref 35.0–45.0)
Hemoglobin: 13.3 g/dL (ref 11.7–15.5)
Lymphocytes Relative: 40 %
Lymphs Abs: 2080 cells/uL (ref 850–3900)
MCH: 28.2 pg (ref 27.0–33.0)
MCHC: 33.4 g/dL (ref 32.0–36.0)
MCV: 84.3 fL (ref 80.0–100.0)
MPV: 9 fL (ref 7.5–12.5)
Monocytes Absolute: 468 cells/uL (ref 200–950)
Monocytes Relative: 9 %
Neutro Abs: 2496 cells/uL (ref 1500–7800)
Neutrophils Relative %: 48 %
Platelets: 226 10*3/uL (ref 140–400)
RBC: 4.72 MIL/uL (ref 3.80–5.10)
RDW: 13.6 % (ref 11.0–15.0)
WBC: 5.2 10*3/uL (ref 3.8–10.8)

## 2016-03-02 LAB — BASIC METABOLIC PANEL
BUN: 24 mg/dL (ref 7–25)
CO2: 30 mmol/L (ref 20–31)
Calcium: 9.4 mg/dL (ref 8.6–10.4)
Chloride: 104 mmol/L (ref 98–110)
Creat: 0.76 mg/dL (ref 0.60–0.93)
Glucose, Bld: 94 mg/dL (ref 65–99)
Potassium: 3.8 mmol/L (ref 3.5–5.3)
Sodium: 140 mmol/L (ref 135–146)

## 2016-03-02 LAB — LIPID PANEL
Cholesterol: 235 mg/dL — ABNORMAL HIGH (ref 125–200)
HDL: 79 mg/dL (ref >=46)
LDL Cholesterol: 142 mg/dL — ABNORMAL HIGH (ref <130)
Total CHOL/HDL Ratio: 3 Ratio (ref <=5.0)
Triglycerides: 72 mg/dL (ref <150)
VLDL: 14 mg/dL (ref <30)

## 2016-03-02 LAB — HEPATIC FUNCTION PANEL
ALT: 17 U/L (ref 6–29)
AST: 14 U/L (ref 10–35)
Albumin: 3.9 g/dL (ref 3.6–5.1)
Alkaline Phosphatase: 61 U/L (ref 33–130)
Bilirubin, Direct: 0.1 mg/dL (ref <=0.2)
Indirect Bilirubin: 0.3 mg/dL (ref 0.2–1.2)
Total Bilirubin: 0.4 mg/dL (ref 0.2–1.2)
Total Protein: 6.6 g/dL (ref 6.1–8.1)

## 2016-03-02 NOTE — Addendum Note (Signed)
Addended by: Eulis Foster on: 03/02/2016 08:57 AM   Modules accepted: Orders

## 2016-03-02 NOTE — Addendum Note (Signed)
Addended by: Eulis Foster on: 03/02/2016 08:58 AM   Modules accepted: Orders

## 2016-03-02 NOTE — Addendum Note (Signed)
Addended by: Eulis Foster on: 03/02/2016 08:59 AM   Modules accepted: Orders

## 2016-03-04 ENCOUNTER — Telehealth: Payer: Self-pay | Admitting: Nurse Practitioner

## 2016-03-04 NOTE — Telephone Encounter (Signed)
Follow Up:   Returning call from yesterday,concerning her lab results. She also said the nurse told to remind her to send her a copy of her results also.

## 2016-03-04 NOTE — Telephone Encounter (Signed)
I spoke with the patient. She is aware of her results.  

## 2016-04-05 ENCOUNTER — Ambulatory Visit (INDEPENDENT_AMBULATORY_CARE_PROVIDER_SITE_OTHER): Payer: PPO | Admitting: Obstetrics & Gynecology

## 2016-04-05 ENCOUNTER — Encounter: Payer: Self-pay | Admitting: Obstetrics & Gynecology

## 2016-04-05 VITALS — BP 110/70 | HR 68 | Resp 14 | Ht 64.0 in | Wt 147.0 lb

## 2016-04-05 DIAGNOSIS — Z0000 Encounter for general adult medical examination without abnormal findings: Secondary | ICD-10-CM | POA: Diagnosis not present

## 2016-04-05 DIAGNOSIS — Z01419 Encounter for gynecological examination (general) (routine) without abnormal findings: Secondary | ICD-10-CM

## 2016-04-05 LAB — POCT URINALYSIS DIPSTICK
BILIRUBIN UA: NEGATIVE
GLUCOSE UA: NEGATIVE
Ketones, UA: NEGATIVE
LEUKOCYTES UA: NEGATIVE
NITRITE UA: NEGATIVE
Protein, UA: NEGATIVE
Urobilinogen, UA: NEGATIVE
pH, UA: 5

## 2016-04-05 NOTE — Progress Notes (Signed)
75 y.o. G51P7 MarriedCaucasianF here for annual exam.  Doing well.  Reports she's having a little issues with an anal fissure.  Had a little diarrhea and this irritated the fissure.  Denies rectal bleeding except for right when she had the diarrhea.  Last colonoscopy was 2005.  Declines additional colonoscopy.  Had appt with GI on Wed.  PCP:  Dr. Reynaldo Minium.  Did stool test for blood in December.  Had blood work then.   Patient's last menstrual period was 11/29/1974.          Sexually active: No.  The current method of family planning is status post hysterectomy.    Exercising: Yes.    Tennis, yard work Smoker:  no  Health Maintenance: Pap:  1/11 Negative History of abnormal Pap:  yes MMG:  2008 BIRADS1:neg.  Declines additional MMGs Colonoscopy: 08/20/2004.  Declines additional colonoscopy.   BMD:   2008.  Declines doing another one TDaP:  W/ PCP Screening Labs: PCP, Urine today: PCP   reports that she has quit smoking. She has never used smokeless tobacco. She reports that she drinks about 2.4 oz of alcohol per week. She reports that she does not use illicit drugs.  Past Medical History  Diagnosis Date  . CAD (coronary artery disease)     S/P NSTEMI 4/06 EF 60%; Treated with Cypher DES to LAD and RCA with kissing balloon PCI diagonal; Treadmill myoview 10/09 for CP: Walked 7:06. mild ST-T-wave changes in recovery. EF 71%. Very mild reversible defect in base of the inferior wall --> medical rx.  . Hypertension     Possible white-coat component  . Hyperlipidemia     Intolerate to multiple statins due to severe myalgias  . Glucose intolerance (impaired glucose tolerance)   . History of medication noncompliance   . Depression   . Microhematuria   . MI (myocardial infarction) ([place])     acute-s/p stent  . Vitamin D deficiency   . Heart murmur   . Psoriasis   . Anxiety and depression     Past Surgical History  Procedure Laterality Date  . Coronary stent placement    . Appendectomy     . Abdominal hysterectomy      and anterior repair  . Mohs surgery  9/14    BCC.  [facility]  . Breast biopsy    . Tonsillectomy and adenoidectomy    . Bladder tact      Current Outpatient Prescriptions  Medication Sig Dispense Refill  . amLODipine (NORVASC) 5 MG tablet Take 1 tablet (5 mg total) by mouth daily. 90 tablet 2  . aspirin 81 MG EC tablet Take 81 mg by mouth daily.      . clopidogrel (PLAVIX) 75 MG tablet Take 1 tablet (75 mg total) by mouth daily. 90 tablet 2  . ergocalciferol (VITAMIN D2) 50000 UNITS capsule Take 50,000 Units by mouth once a week. On Friday    . metoprolol succinate (TOPROL-XL) 50 MG 24 hr tablet Take 1 tablet (50 mg total) by mouth daily. 90 tablet 2  . NITROSTAT 0.4 MG SL tablet DISSOLVE 1 TABLET UNDER TONGUE AS NEEDED FOR CHEST PAIN,MAY REPEAT IN5 MINUTES UP TO 3 DOSES. 25 tablet 3  . potassium chloride SA (K-DUR,KLOR-CON) 20 MEQ tablet Take 1 tablet (20 mEq total) by mouth daily. 90 tablet 2  . valsartan-hydrochlorothiazide (DIOVAN-HCT) 320-25 MG tablet Take 1 tablet by mouth daily. 90 tablet 2   No current facility-administered medications for this visit.  Family History  Problem Relation Age of Onset  . Heart disease Father   . Heart attack Father   . Coronary artery disease Neg Hx   . Melanoma Child     Metastatic  . Hypertension Mother   . Cancer Daughter     melanoma  . Stroke Maternal Grandmother   . Stroke Maternal Grandfather   . Diabetes Paternal Grandmother   . Melanoma Mother   . Breast cancer Sister 37  . Hypertension Brother   . Asthma Brother   . Ulcers Father   . Renal Disease Maternal Grandfather     kidney removed  . Depression Daughter     x2  . Rectal cancer Son     ROS:  Pertinent items are noted in HPI.  Otherwise, a comprehensive ROS was negative.  Exam:   BP 110/70 mmHg  Pulse 68  Resp 14  Ht 5\' 4"  (1.626 m)  Wt 147 lb (66.679 kg)  BMI 25.22 kg/m2  LMP 11/29/1974  Weight change: -4# Height: 5\' 4"   (162.6 cm)  Ht Readings from Last 3 Encounters:  04/05/16 5\' 4"  (1.626 m)  02/27/16 5' 5.5" (1.664 m)  08/29/15 5\' 5"  (1.651 m)    General appearance: alert, cooperative and appears stated age Head: Normocephalic, without obvious abnormality, atraumatic Neck: no adenopathy, supple, symmetrical, trachea midline and thyroid normal to inspection and palpation Lungs: clear to auscultation bilaterally Breasts: normal appearance, no masses or tenderness Heart: regular rate and rhythm Abdomen: soft, non-tender; bowel sounds normal; no masses,  no organomegaly Extremities: extremities normal, atraumatic, no cyanosis or edema Skin: Skin color, texture, turgor normal. No rashes or lesions Lymph nodes: Cervical, supraclavicular, and axillary nodes normal. No abnormal inguinal nodes palpated Neurologic: Grossly normal  Pelvic: External genitalia:  no lesions              Urethra:  normal appearing urethra with no masses, tenderness or lesions              Bartholins and Skenes: normal                 Vagina: atrohpic, vaginal apex scarring making cervix difficult to see              Pap taken: No. Bimanual Exam:  Uterus:  normal size, contour, position, consistency, mobility, non-tender              Adnexa: normal adnexa and no mass, fullness, tenderness               Rectovaginal: Confirms               Anus:  normal sphincter tone, no lesions  Chaperone was present for exam.  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 Denies recent issues PMP, no HRT Occ SUI Hypertension CAD with stent placement Elevated lipids   P: Mammogram. Declines any further. D/w pt I disagree with this but she states "I've lived longer than anybody else in my family".  States she would declines treatment for any cancer.   Declines colonoscopy.  Pt knows I disagree with this. pap smear not obtained today. Labs with Dr. Reynaldo Minium yearly. Return  annually or prn

## 2016-04-07 ENCOUNTER — Ambulatory Visit: Payer: Medicare Other | Admitting: Internal Medicine

## 2016-05-07 DIAGNOSIS — H25813 Combined forms of age-related cataract, bilateral: Secondary | ICD-10-CM | POA: Diagnosis not present

## 2016-05-07 DIAGNOSIS — H353121 Nonexudative age-related macular degeneration, left eye, early dry stage: Secondary | ICD-10-CM | POA: Diagnosis not present

## 2016-05-07 DIAGNOSIS — H43811 Vitreous degeneration, right eye: Secondary | ICD-10-CM | POA: Diagnosis not present

## 2016-05-11 NOTE — Progress Notes (Signed)
Cardiology Office Note   Date:  05/12/2016   ID:  Erika Brown, DOB 1941/11/21, MRN [id number]  PCP:  Geoffery Lyons, MD  Cardiologist:  Dr. Aundra Dubin    Chief Complaint  Patient presents with  . Fatigue    low BP      History of Present Illness: Erika Brown is a 75 y.o. female who presents for fatigue and BP concerns.  She has a history of known CAD s/p NSTEMI in 4/06 with PCI to the LAD and RCA along with PTCA to the diagonal, hyperlipidemia, and HTN. Last Myoview in 2009 with EF 71% and small area of ischemia at the inferior base. She has been managed medically.   She brought in her BP monitor and we reviewed the readings.  BP 90-130/68 with pulse in the 60s.  She took her BP extra when she felt weak.  At that time BP systolic 90.  No syncope, no lightheadedness.  She is able to play tennis without problems.  No chest pain or SOB. She does hydrate with playing tennis.     Past Medical History  Diagnosis Date  . CAD (coronary artery disease)     S/P NSTEMI 4/06 EF 60%; Treated with Cypher DES to LAD and RCA with kissing balloon PCI diagonal; Treadmill myoview 10/09 for CP: Walked 7:06. mild ST-T-wave changes in recovery. EF 71%. Very mild reversible defect in base of the inferior wall --> medical rx.  . Hypertension     Possible white-coat component  . Hyperlipidemia     Intolerate to multiple statins due to severe myalgias  . Glucose intolerance (impaired glucose tolerance)   . History of medication noncompliance   . Depression   . Microhematuria   . MI (myocardial infarction) ([place])     acute-s/p stent  . Vitamin D deficiency   . Heart murmur   . Psoriasis   . Anxiety and depression     Past Surgical History  Procedure Laterality Date  . Coronary stent placement    . Appendectomy    . Abdominal hysterectomy      and anterior repair  . Mohs surgery  9/14    BCC.  [facility]  . Breast biopsy    . Tonsillectomy and adenoidectomy    . Bladder tact        Current Outpatient Prescriptions  Medication Sig Dispense Refill  . amLODipine (NORVASC) 5 MG tablet Take 1 tablet (5 mg total) by mouth daily. 90 tablet 2  . aspirin 81 MG EC tablet Take 81 mg by mouth daily.      . clopidogrel (PLAVIX) 75 MG tablet Take 1 tablet (75 mg total) by mouth daily. 90 tablet 2  . ergocalciferol (VITAMIN D2) 50000 UNITS capsule Take 50,000 Units by mouth once a week. On Friday    . metoprolol succinate (TOPROL-XL) 50 MG 24 hr tablet Take 1/2 tablet by mouth daily 90 tablet 2  . nitroGLYCERIN (NITROSTAT) 0.4 MG SL tablet DISSOLVE 1 TABLET UNDER TONGUE AS NEEDED FOR CHEST PAIN,MAY REPEAT IN5 MINUTES UP TO 3 DOSES. 25 tablet 3  . potassium chloride SA (K-DUR,KLOR-CON) 20 MEQ tablet Take 1 tablet (20 mEq total) by mouth daily. 90 tablet 2  . valsartan-hydrochlorothiazide (DIOVAN-HCT) 320-25 MG tablet Take 1 tablet by mouth daily. 90 tablet 2   No current facility-administered medications for this visit.    Allergies:   Iodine; Latex; Other; and Statins    Social History:  The patient  reports that  she has quit smoking. She has never used smokeless tobacco. She reports that she drinks about 2.4 oz of alcohol per week. She reports that she does not use illicit drugs.   Family History:  The patient's family history includes Asthma in her brother; Breast cancer (age of onset: 1) in her sister; Cancer in her daughter; Depression in her daughter; Diabetes in her paternal grandmother; Heart attack in her father; Heart disease in her father; Hypertension in her brother and mother; Melanoma in her child and mother; Rectal cancer in her son; Renal Disease in her maternal grandfather; Stroke in her maternal grandfather and maternal grandmother; Ulcers in her father. There is no history of Coronary artery disease.    ROS:  General:no colds or fevers, no weight changes Skin:no rashes or ulcers HEENT:no blurred vision, no congestion CV:see HPI PUL:see HPI GI:no diarrhea  constipation or melena, no indigestion GU:no hematuria, no dysuria MS:no joint pain, no claudication Neuro:no syncope, no lightheadedness Endo:no diabetes, no thyroid disease  Wt Readings from Last 3 Encounters:  05/12/16 146 lb 12.8 oz (66.588 kg)  04/05/16 147 lb (66.679 kg)  02/27/16 146 lb 6.4 oz (66.407 kg)     PHYSICAL EXAM: VS:  BP 110/78 mmHg  Pulse 91  Ht 5\' 4"  (1.626 m)  Wt 146 lb 12.8 oz (66.588 kg)  BMI 25.19 kg/m2  SpO2 98%  LMP 11/29/1974 , BMI Body mass index is 25.19 kg/(m^2). General:Pleasant affect, NAD Skin:Warm and dry, brisk capillary refill HEENT:normocephalic, sclera clear, mucus membranes moist Neck:supple, no JVD, no bruits  Heart:S1S2 RRR without murmur, gallup, rub or click Lungs:clear without rales, rhonchi, or wheezes [id number], non tender, + BS, do not palpate liver spleen or masses Ext:no lower ext edema, 2+ pedal pulses, 2+ radial pulses Neuro:alert and oriented, MAE, follows commands, + facial symmetry    EKG:  EKG is  NOT ordered today.    Recent Labs: 03/02/2016: ALT 17; BUN 24; Creat 0.76; Hemoglobin 13.3; Platelets 226; Potassium 3.8; Sodium 140    Lipid Panel    Component Value Date/Time   CHOL 235* 03/02/2016 0859   TRIG 72 03/02/2016 0859   HDL 79 03/02/2016 0859   CHOLHDL 3.0 03/02/2016 0859   VLDL 14 03/02/2016 0859   LDLCALC 142* 03/02/2016 0859   LDLDIRECT 121.7 09/08/2007 0852       Other studies Reviewed: Additional studies/ records that were reviewed today include: .previous notes, EKGs   ASSESSMENT AND PLAN:  1.  Episodic hypotension.  Symptomatic with fatigue.  No pain.  Will decrease BB to 25 mg and she will call next week and let us know if she is better.  If she is will call in meds for new meds.    2. CAD - doing well - she remains on DAPT. Continue with CV risk factor modification. No chest pain or SOb  2. HTN - BP see abpve   3. HLD - not on therapy - resistant to the idea - LDL 142 last month.     4. IBS Current medicines are reviewed with the patient today.  The patient Has no concerns regarding medicines.  The following changes have been made:  See above Labs/ tests ordered today include:see above  Disposition:   FU:  see above  Signed, Cecilie Kicks, NP  05/12/2016 10:34 AM    [facility], [place], [place], [place] Phone: [phone]; Fax: [phone]

## 2016-05-12 ENCOUNTER — Encounter: Payer: Self-pay | Admitting: Cardiology

## 2016-05-12 ENCOUNTER — Ambulatory Visit (INDEPENDENT_AMBULATORY_CARE_PROVIDER_SITE_OTHER): Payer: PPO | Admitting: Cardiology

## 2016-05-12 VITALS — BP 110/78 | HR 91 | Ht 64.0 in | Wt 146.8 lb

## 2016-05-12 DIAGNOSIS — E785 Hyperlipidemia, unspecified: Secondary | ICD-10-CM

## 2016-05-12 DIAGNOSIS — I2510 Atherosclerotic heart disease of native coronary artery without angina pectoris: Secondary | ICD-10-CM

## 2016-05-12 DIAGNOSIS — I959 Hypotension, unspecified: Secondary | ICD-10-CM | POA: Diagnosis not present

## 2016-05-12 DIAGNOSIS — R5383 Other fatigue: Secondary | ICD-10-CM | POA: Diagnosis not present

## 2016-05-12 MED ORDER — NITROGLYCERIN 0.4 MG SL SUBL: SUBLINGUAL_TABLET | SUBLINGUAL | Status: DC

## 2016-05-12 MED ORDER — METOPROLOL SUCCINATE ER 50 MG PO TB24: ORAL_TABLET | ORAL | Status: DC

## 2016-05-12 NOTE — Patient Instructions (Signed)
Medication Instructions:  Your physician has recommended you make the following change in your medication:  1.  DECREASE the Metoprolol XL 50 mg to taking 1/2 tablet daily (CALL OUR OFFICE NEXT WEEK IF YOU NEED A NEW RX)  Labwork: TODAY:  TSH  Testing/Procedures: None ordered  Follow-Up: Your physician recommends that you schedule a follow-up appointment in: SEE DR. MCLEAN AS PLANNED   Any Other Special Instructions Will Be Listed Below (If Applicable).     If you need a refill on your cardiac medications before your next appointment, please call your pharmacy.

## 2016-05-13 DIAGNOSIS — H2511 Age-related nuclear cataract, right eye: Secondary | ICD-10-CM | POA: Diagnosis not present

## 2016-05-13 LAB — TSH: TSH: 1.62 mIU/L

## 2016-05-20 ENCOUNTER — Telehealth: Payer: Self-pay | Admitting: Cardiology

## 2016-05-20 NOTE — Telephone Encounter (Signed)
PHONE  JUST  RINGS  NO ANSWER WILL  TRY LATER .Erika Brown

## 2016-05-20 NOTE — Telephone Encounter (Signed)
Ok let's see if it continues to improve.

## 2016-05-20 NOTE — Telephone Encounter (Signed)
PT CALLED WITH  HEART  RATE AND  B/P READINGS   DUE   TO DECREASING  METOPROLOL  TO  25 MG    HEART RATE  RUNNING   FROM 61-83 AND  B/P  95-129/60-82   WITH ONE B/P READING  OF  95/65 AND PT  FELT  FATIGUED. WILL  FORWARD TO LAURA INGOLD FOR REVIEW .Erika Brown

## 2016-05-20 NOTE — Telephone Encounter (Signed)
New message   Pt verbalized that she is returning call to [facility]  please call pt and advise

## 2016-05-21 NOTE — Telephone Encounter (Signed)
Spoke with pt who is reporting HR in the 60s and BP around 109/75.  She is still feeling a little fatigue but understands this will decrease as she is on the lower dose of Metoprolol.  She will c/b if further concerns or questions.

## 2016-05-24 DIAGNOSIS — H268 Other specified cataract: Secondary | ICD-10-CM | POA: Diagnosis not present

## 2016-05-24 DIAGNOSIS — H2511 Age-related nuclear cataract, right eye: Secondary | ICD-10-CM | POA: Diagnosis not present

## 2016-06-22 DIAGNOSIS — H2512 Age-related nuclear cataract, left eye: Secondary | ICD-10-CM | POA: Diagnosis not present

## 2016-06-28 DIAGNOSIS — H269 Unspecified cataract: Secondary | ICD-10-CM | POA: Diagnosis not present

## 2016-06-28 DIAGNOSIS — H25812 Combined forms of age-related cataract, left eye: Secondary | ICD-10-CM | POA: Diagnosis not present

## 2016-06-28 DIAGNOSIS — H2512 Age-related nuclear cataract, left eye: Secondary | ICD-10-CM | POA: Diagnosis not present

## 2016-07-15 ENCOUNTER — Telehealth: Payer: Self-pay | Admitting: Cardiology

## 2016-07-15 DIAGNOSIS — R0989 Other specified symptoms and signs involving the circulatory and respiratory systems: Secondary | ICD-10-CM

## 2016-07-15 NOTE — Telephone Encounter (Signed)
Pt called because she states was at [place] this morning about 11:00 AM  when she felt like she was passing out but didn't . The feeling of almost passing out lasted about 1 to 2 seconds. Pt denies any other symptoms.  Pt said  came home then took her BP it was 125/79 Hr 83 beats/minute the machine said that her heart rate has irregular. Pt said that she got nervous, then  20 minutes later she took her BP again it was [id number] systolic, then the machine reads that the HR was regular . Pt states she is feeling fine now no C/O. Pt is aware to call the office if this happened again and has other symptoms with it. Pt verbalized understanding.

## 2016-07-15 NOTE — Telephone Encounter (Signed)
New Message  Pt c/o PRE Syncope: STAT if syncope occurred within 30 minutes and pt complains of lightheadedness High Priority if episode of passing out, completely, today or in last 24 hours   1. Did you pass out today? Per pt did not pass out; felt like she was going to pass out   2. When is the last time you passed out? Per pt never passed out   3. Has this occurred multiple times? Per pt just this time   4. Did you have any symptoms prior to passing out? Per pt no   Pt stated she took her bp after the feeling of passing out and it was 125/79 pulse 83... Pt did not know exactly want time it was, but stated she took the bp again about 20 mins later and it was 142/101 pulse 79. Pt would like to speak to RN. Please call back to discuss

## 2016-07-15 NOTE — Telephone Encounter (Signed)
With irregular heart rate, would have her wear 48 hour holter.

## 2016-07-16 NOTE — Telephone Encounter (Signed)
Discussed Dr Claris Gladden recommendation for 48 hour monitor with pt, she agreed with plan for 48 hour monitor, will forward to [facility] to contact pt to schedule monitor.

## 2016-07-22 ENCOUNTER — Other Ambulatory Visit: Payer: Self-pay | Admitting: Cardiology

## 2016-07-22 ENCOUNTER — Ambulatory Visit (INDEPENDENT_AMBULATORY_CARE_PROVIDER_SITE_OTHER): Payer: PPO

## 2016-07-22 DIAGNOSIS — R55 Syncope and collapse: Secondary | ICD-10-CM | POA: Diagnosis not present

## 2016-07-22 DIAGNOSIS — R0989 Other specified symptoms and signs involving the circulatory and respiratory systems: Secondary | ICD-10-CM

## 2016-07-30 ENCOUNTER — Telehealth: Payer: Self-pay | Admitting: Cardiology

## 2016-07-30 NOTE — Telephone Encounter (Signed)
PT AWARE  RESULTS ARE NOT  AVAILABLE AT  THIS TIME   WILL CALL  ONCE  REVIEWED .Adonis Housekeeper

## 2016-07-30 NOTE — Telephone Encounter (Signed)
New message      Pt calling to get the results of wearing a heart monitor. Please call.

## 2016-08-09 ENCOUNTER — Telehealth: Payer: Self-pay | Admitting: Cardiology

## 2016-08-09 NOTE — Telephone Encounter (Signed)
New Message  Pt voiced she is returning nurses call.  Please f/u with pt

## 2016-08-09 NOTE — Telephone Encounter (Signed)
Spoke with pt about recent monitor results.

## 2016-08-12 ENCOUNTER — Other Ambulatory Visit: Payer: Self-pay | Admitting: Cardiology

## 2016-08-12 DIAGNOSIS — Z85828 Personal history of other malignant neoplasm of skin: Secondary | ICD-10-CM | POA: Diagnosis not present

## 2016-08-12 DIAGNOSIS — L239 Allergic contact dermatitis, unspecified cause: Secondary | ICD-10-CM | POA: Diagnosis not present

## 2016-08-19 ENCOUNTER — Other Ambulatory Visit: Payer: Self-pay | Admitting: Cardiology

## 2016-08-31 ENCOUNTER — Encounter (HOSPITAL_COMMUNITY): Payer: Self-pay | Admitting: Emergency Medicine

## 2016-08-31 ENCOUNTER — Emergency Department (HOSPITAL_COMMUNITY): Payer: PPO

## 2016-08-31 ENCOUNTER — Emergency Department (HOSPITAL_COMMUNITY)
Admission: EM | Admit: 2016-08-31 | Discharge: 2016-08-31 | Disposition: A | Discharge status: Home / Self Care | Payer: PPO | Attending: Emergency Medicine | Admitting: Emergency Medicine

## 2016-08-31 DIAGNOSIS — Z91040 Latex allergy status: Secondary | ICD-10-CM | POA: Insufficient documentation

## 2016-08-31 DIAGNOSIS — Z7982 Long term (current) use of aspirin: Secondary | ICD-10-CM | POA: Diagnosis not present

## 2016-08-31 DIAGNOSIS — Z87891 Personal history of nicotine dependence: Secondary | ICD-10-CM | POA: Diagnosis not present

## 2016-08-31 DIAGNOSIS — E876 Hypokalemia: Secondary | ICD-10-CM | POA: Insufficient documentation

## 2016-08-31 DIAGNOSIS — Z955 Presence of coronary angioplasty implant and graft: Secondary | ICD-10-CM | POA: Diagnosis not present

## 2016-08-31 DIAGNOSIS — I10 Essential (primary) hypertension: Secondary | ICD-10-CM | POA: Insufficient documentation

## 2016-08-31 DIAGNOSIS — R42 Dizziness and giddiness: Secondary | ICD-10-CM | POA: Diagnosis not present

## 2016-08-31 DIAGNOSIS — I252 Old myocardial infarction: Secondary | ICD-10-CM | POA: Insufficient documentation

## 2016-08-31 DIAGNOSIS — I2510 Atherosclerotic heart disease of native coronary artery without angina pectoris: Secondary | ICD-10-CM | POA: Diagnosis not present

## 2016-08-31 DIAGNOSIS — R404 Transient alteration of awareness: Secondary | ICD-10-CM | POA: Diagnosis not present

## 2016-08-31 DIAGNOSIS — R112 Nausea with vomiting, unspecified: Secondary | ICD-10-CM | POA: Diagnosis not present

## 2016-08-31 LAB — COMPREHENSIVE METABOLIC PANEL
ALBUMIN: 4 g/dL (ref 3.5–5.0)
ALK PHOS: 56 U/L (ref 38–126)
ALT: 23 U/L (ref 14–54)
AST: 25 U/L (ref 15–41)
Anion gap: 10 (ref 5–15)
BILIRUBIN TOTAL: 0.6 mg/dL (ref 0.3–1.2)
BUN: 24 mg/dL — AB (ref 6–20)
CALCIUM: 9.5 mg/dL (ref 8.9–10.3)
CO2: 25 mmol/L (ref 22–32)
Chloride: 102 mmol/L (ref 101–111)
Creatinine, Ser: 0.9 mg/dL (ref 0.44–1.00)
GFR calc Af Amer: >60 mL/min (ref >60)
GFR calc non Af Amer: >60 mL/min (ref >60)
GLUCOSE: 163 mg/dL — AB (ref 65–99)
Potassium: 3.1 mmol/L — ABNORMAL LOW (ref 3.5–5.1)
Sodium: 137 mmol/L (ref 135–145)
TOTAL PROTEIN: 7.3 g/dL (ref 6.5–8.1)

## 2016-08-31 LAB — CBC WITH DIFFERENTIAL/PLATELET
BASOS ABS: 0 10*3/uL (ref 0.0–0.1)
BASOS PCT: 0 %
EOS PCT: 0 %
Eosinophils Absolute: 0 10*3/uL (ref 0.0–0.7)
HCT: 40.9 % (ref 36.0–46.0)
Hemoglobin: 13.3 g/dL (ref 12.0–15.0)
LYMPHS PCT: 8 %
Lymphs Abs: 0.9 10*3/uL (ref 0.7–4.0)
MCH: 27.1 pg (ref 26.0–34.0)
MCHC: 32.5 g/dL (ref 30.0–36.0)
MCV: 83.3 fL (ref 78.0–100.0)
MONO ABS: 0.6 10*3/uL (ref 0.1–1.0)
Monocytes Relative: 6 %
Neutro Abs: 9.2 10*3/uL — ABNORMAL HIGH (ref 1.7–7.7)
Neutrophils Relative %: 86 %
PLATELETS: 208 10*3/uL (ref 150–400)
RBC: 4.91 MIL/uL (ref 3.87–5.11)
RDW: 13.5 % (ref 11.5–15.5)
WBC: 10.7 10*3/uL — ABNORMAL HIGH (ref 4.0–10.5)

## 2016-08-31 LAB — URINALYSIS, ROUTINE W REFLEX MICROSCOPIC
BILIRUBIN URINE: NEGATIVE
Glucose, UA: 250 mg/dL — AB
Ketones, ur: 15 mg/dL — AB
Leukocytes, UA: NEGATIVE
Nitrite: NEGATIVE
PROTEIN: NEGATIVE mg/dL
Specific Gravity, Urine: 1.018 (ref 1.005–1.030)
pH: 7 (ref 5.0–8.0)

## 2016-08-31 LAB — I-STAT TROPONIN, ED: TROPONIN I, POC: 0.01 ng/mL (ref 0.00–0.08)

## 2016-08-31 LAB — URINE MICROSCOPIC-ADD ON

## 2016-08-31 MED ORDER — POTASSIUM CHLORIDE CRYS ER 20 MEQ PO TBCR
20.0000 meq | EXTENDED_RELEASE_TABLET | Freq: Once | ORAL | Status: DC
  Filled 2016-08-31: qty 1

## 2016-08-31 MED ORDER — MECLIZINE HCL 12.5 MG PO TABS: 12.5000 mg | ORAL_TABLET | Freq: Three times a day (TID) | ORAL | 0 refills | Status: DC | PRN

## 2016-08-31 MED ORDER — MECLIZINE HCL 25 MG PO TABS
25.0000 mg | ORAL_TABLET | Freq: Once | ORAL | Status: AC
Start: 2016-08-31 — End: 2016-08-31
  Administered 2016-08-31: 25 mg via ORAL
  Filled 2016-08-31: qty 1

## 2016-08-31 MED ORDER — SODIUM CHLORIDE 0.9 % IV BOLUS (SEPSIS)
500.0000 mL | Freq: Once | INTRAVENOUS | Status: AC
  Administered 2016-08-31: 500 mL via INTRAVENOUS

## 2016-08-31 MED ORDER — ONDANSETRON HCL 4 MG/2ML IJ SOLN
4.0000 mg | Freq: Once | INTRAMUSCULAR | Status: AC
  Administered 2016-08-31: 4 mg via INTRAVENOUS
  Filled 2016-08-31: qty 2

## 2016-08-31 NOTE — ED Notes (Signed)
Stuck pt twice was able to collect istat but not the other labs RN  Loma Sousa made aware.

## 2016-08-31 NOTE — ED Notes (Signed)
Patient ambulated to restroom with steady gait and transported to MRI.

## 2016-08-31 NOTE — ED Notes (Signed)
Daughter Romie Minus   [phone]

## 2016-08-31 NOTE — ED Provider Notes (Signed)
Signed out by Dr Ralene Bathe at 0730 to check MRI when back, and if neg for cva, to d/c to home.  MRI neg for cva.  Patient feels improved. Ambulates to BR with steady gait.  Patient currently appears stable for d/c.      Lajean Saver, MD 08/31/16 1232

## 2016-08-31 NOTE — Discharge Instructions (Signed)
It was our pleasure to provide your ER care today - we hope that you feel better.  Rest. Drink plenty of fluids.  Take antivert as need for dizziness - antivert may cause drowsiness. No driving when taking antivert, or any time when feeling dizzy.  Follow up with your doctor in the next 1-2 weeks - discuss your medications, including your blood pressure medications, and whether they want to adjust your meds or doses.   From today's lab work, your potassium level is low (3.1) - eat plenty of fruits and vegetables, take one extra of your potassium supplement each day for the next 5 days, and follow up with your doctor in 1 week.  Return to ER if worse, weak/fainting, fevers, severe dizziness, other concern.

## 2016-08-31 NOTE — ED Triage Notes (Signed)
Per EMS, pt is from home with complaints of n/v and dizziness around midnight. EMS reported pt had negative orthostatic vs. EMS administered 4mg  zofran iv.

## 2016-08-31 NOTE — ED Provider Notes (Signed)
[facility] DEPT Provider Note   CSN: [id number] Arrival date & time: 08/31/16  0246  By signing my name below, I, Erika Brown, attest that this documentation has been prepared under the direction and in the presence of Quintella Reichert, MD. Electronically Signed: Gwenlyn Brown, ED Scribe. 08/31/16. 3:17 AM.   History   Chief Complaint Chief Complaint  Patient presents with  . Emesis  . Dizziness   The history is provided by the patient. No language interpreter was used.   HPI Comments: Erika Brown is a 75 y.o. female with PMHx of CAD, HLD, MI and heart murmur who presents to the Emergency Department complaining of persistent, episodic vomiting onset 12 AM tonight. Pt has had 8-9 episodes of vomiting tonight. Pt reports associated constant dizziness. Dizziness is exacerbated with movement. She states she woke up this evening to turn off an alarm and after returning to bed, she noticed she was feeling dizzy. Denies hx of stroke. Denies chest pain, fever, diarrhea, shortness of breath, numbness, weakness, pain, photosensitivity.  Past Medical History:  Diagnosis Date  . Anxiety and depression   . CAD (coronary artery disease)    S/P NSTEMI 4/06 EF 60%; Treated with Cypher DES to LAD and RCA with kissing balloon PCI diagonal; Treadmill myoview 10/09 for CP: Walked 7:06. mild ST-T-wave changes in recovery. EF 71%. Very mild reversible defect in base of the inferior wall --> medical rx.  . Depression   . Glucose intolerance (impaired glucose tolerance)   . Heart murmur   . History of medication noncompliance   . Hyperlipidemia    Intolerate to multiple statins due to severe myalgias  . Hypertension    Possible white-coat component  . MI (myocardial infarction)    acute-s/p stent  . Microhematuria   . Psoriasis   . Vitamin D deficiency     Patient Active Problem List   Diagnosis Date Noted  . SHOULDER PAIN, LEFT 02/11/2009  . Hyperlipidemia 02/09/2009  . HYPERTENSION,  BENIGN 02/09/2009  . CAD, NATIVE VESSEL 02/09/2009    Past Surgical History:  Procedure Laterality Date  . ABDOMINAL HYSTERECTOMY     and anterior repair  . APPENDECTOMY    . bladder tact    . BREAST BIOPSY    . CORONARY STENT PLACEMENT    . MOHS SURGERY  9/14   BCC.  [facility]  . TONSILLECTOMY AND ADENOIDECTOMY      OB History    Gravida Para Term Preterm AB Living   7 7       6    SAB TAB Ectopic Multiple Live Births                  Obstetric Comments   One daughter died of melanoma       Home Medications    Prior to Admission medications   Medication Sig Start Date End Date Taking? Authorizing Provider  amLODipine (NORVASC) 5 MG tablet Take 1 tablet (5 mg total) by mouth daily. 08/12/16   Larey Dresser, MD  aspirin 81 MG EC tablet Take 81 mg by mouth daily.      Historical Provider, MD  clopidogrel (PLAVIX) 75 MG tablet Take 1 tablet (75 mg total) by mouth daily. 08/12/16   Larey Dresser, MD  ergocalciferol (VITAMIN D2) 50000 UNITS capsule Take 50,000 Units by mouth once a week. On Friday    Historical Provider, MD  metoprolol succinate (TOPROL-XL) 50 MG 24 hr tablet Take 1/2 tablet by mouth daily  05/12/16   Isaiah Serge, NP  nitroGLYCERIN (NITROSTAT) 0.4 MG SL tablet DISSOLVE 1 TABLET UNDER TONGUE AS NEEDED FOR CHEST PAIN,MAY REPEAT IN5 MINUTES UP TO 3 DOSES. 05/12/16   Isaiah Serge, NP  potassium chloride SA (K-DUR,KLOR-CON) 20 MEQ tablet Take 1 tablet (20 mEq total) by mouth daily. 11/11/15   Larey Dresser, MD  valsartan-hydrochlorothiazide (DIOVAN-HCT) 320-25 MG tablet Take 1 tablet by mouth daily. 08/19/16   Larey Dresser, MD    Family History Family History  Problem Relation Age of Onset  . Heart disease Father   . Heart attack Father   . Coronary artery disease Neg Hx   . Melanoma Child     Metastatic  . Hypertension Mother   . Cancer Daughter     melanoma  . Stroke Maternal Grandmother   . Stroke Maternal Grandfather   . Diabetes Paternal  Grandmother   . Melanoma Mother   . Breast cancer Sister 25  . Hypertension Brother   . Asthma Brother   . Ulcers Father   . Renal Disease Maternal Grandfather     kidney removed  . Depression Daughter     x2  . Rectal cancer Son     Social History Social History  Substance Use Topics  . Smoking status: Former Research scientist (life sciences)  . Smokeless tobacco: Never Used  . Alcohol use 2.4 oz/week    4 Glasses of wine per week     Comment: glass of wine at dinner most nights     Allergies   Iodine; Latex; Other; and Statins   Review of Systems Review of Systems  Constitutional: Negative for fever.  Eyes: Negative for photophobia.  Respiratory: Negative for shortness of breath.   Cardiovascular: Negative for chest pain.  Gastrointestinal: Positive for vomiting. Negative for diarrhea.  Neurological: Positive for dizziness. Negative for weakness and numbness.  All other systems reviewed and are negative.    Physical Exam Updated Vital Signs BP 145/71 (BP Location: Left Arm)   Pulse 64   Temp 98.2 F (36.8 C) (Oral)   Resp 16   Ht 5' 4.5" (1.638 m)   Wt 147 lb (66.7 kg)   LMP 11/29/1974   SpO2 100%   BMI 24.84 kg/m   Physical Exam  Constitutional: She is oriented to person, place, and time. She appears well-developed and well-nourished.  HENT:  Head: Normocephalic and atraumatic.  TM's clear bilaterally  No facial asymmetry   Eyes: Pupils are equal, round, and reactive to light.  nystagmus on gaze to left.  Cardiovascular: Normal rate and regular rhythm.   No murmur heard. Pulmonary/Chest: Effort normal and breath sounds normal. No respiratory distress.  Abdominal: Soft. There is no tenderness. There is no rebound and no guarding.  Musculoskeletal: She exhibits no edema or tenderness.  Neurological: She is alert and oriented to person, place, and time. No cranial nerve deficit.  5 out of 5 strength in all 4 extremities, sensation to light touch intact in all 4 extremities.  No facial asymmetry.  Skin: Skin is warm and dry.  Psychiatric: She has a normal mood and affect. Her behavior is normal.  Nursing note and vitals reviewed.  ED Treatments / Results  DIAGNOSTIC STUDIES: Oxygen Saturation is 100% on RA, normal by my interpretation.    COORDINATION OF CARE: 3:12 AM Discussed treatment plan with pt at bedside which includes Zofran and CT Head and pt agreed to plan.  Labs (all labs ordered are listed, but only abnormal  results are displayed) Labs Reviewed  CBC WITH DIFFERENTIAL/PLATELET - Abnormal; Notable for the following:       Result Value   WBC 10.7 (*)    Neutro Abs 9.2 (*)    All other components within normal limits  COMPREHENSIVE METABOLIC PANEL  URINALYSIS, ROUTINE W REFLEX MICROSCOPIC (NOT AT [facility])  Randolm Idol, ED    EKG  EKG Interpretation None       Radiology Ct Head Wo Contrast  Result Date: 08/31/2016 CLINICAL DATA:  75 year old female with dizziness and vomiting. EXAM: CT HEAD WITHOUT CONTRAST TECHNIQUE: Contiguous axial images were obtained from the base of the skull through the vertex without intravenous contrast. COMPARISON:  None. FINDINGS: Brain: No evidence of acute infarction, hemorrhage, hydrocephalus, extra-axial collection or mass lesion/mass effect. Vascular: No hyperdense vessel or unexpected calcification. Skull: Normal. Negative for fracture or focal lesion. Sinuses/Orbits: Left maxillary sinus retention cyst or polyp. The remainder of the visualized paranasal sinuses and mastoid air cells are clear. Other: None IMPRESSION: No acute intracranial pathology. Electronically Signed   By: Anner Crete M.D.   On: 08/31/2016 04:09    Procedures Procedures (including critical care time)  Medications Ordered in ED Medications  ondansetron (ZOFRAN) injection 4 mg (4 mg Intravenous Given 08/31/16 0346)  meclizine (ANTIVERT) tablet 25 mg (25 mg Oral Given 08/31/16 0417)     Initial Impression / Assessment and Plan  / ED Course  I have reviewed the triage vital signs and the nursing notes.  Pertinent labs & imaging results that were available during my care of the patient were reviewed by me and considered in my medical decision making (see chart for details).  Clinical Course   Patient here for evaluation of nausea, vomiting, vertigo. She had nystagmus on initial examination. She was too vertiginous to ambulate initially. Following treatment with antibiotics and meclizine her vertigo and nausea have significantly improved. On reassessment her nystagmus has resolved with mild persistent nausea and vertigo. Plan to obtain MRI. Patient care transferred pending MRI.  Final Clinical Impressions(s) / ED Diagnoses   Final diagnoses:  None   I personally performed the services described in this documentation, which was scribed in my presence. The recorded information has been reviewed and is accurate.  New Prescriptions New Prescriptions   No medications on file     Quintella Reichert, MD 08/31/16 [phone]

## 2016-09-13 DIAGNOSIS — I10 Essential (primary) hypertension: Secondary | ICD-10-CM | POA: Diagnosis not present

## 2016-09-13 DIAGNOSIS — M859 Disorder of bone density and structure, unspecified: Secondary | ICD-10-CM | POA: Diagnosis not present

## 2016-09-13 DIAGNOSIS — R8299 Other abnormal findings in urine: Secondary | ICD-10-CM | POA: Diagnosis not present

## 2016-09-20 DIAGNOSIS — Z9861 Coronary angioplasty status: Secondary | ICD-10-CM | POA: Diagnosis not present

## 2016-09-20 DIAGNOSIS — F418 Other specified anxiety disorders: Secondary | ICD-10-CM | POA: Diagnosis not present

## 2016-09-20 DIAGNOSIS — H8113 Benign paroxysmal vertigo, bilateral: Secondary | ICD-10-CM | POA: Diagnosis not present

## 2016-09-20 DIAGNOSIS — Z0000 Encounter for general adult medical examination without abnormal findings: Secondary | ICD-10-CM | POA: Diagnosis not present

## 2016-09-20 DIAGNOSIS — I10 Essential (primary) hypertension: Secondary | ICD-10-CM | POA: Diagnosis not present

## 2016-09-20 DIAGNOSIS — J302 Other seasonal allergic rhinitis: Secondary | ICD-10-CM | POA: Diagnosis not present

## 2016-09-20 DIAGNOSIS — E784 Other hyperlipidemia: Secondary | ICD-10-CM | POA: Diagnosis not present

## 2016-09-20 DIAGNOSIS — Z6825 Body mass index (BMI) 25.0-25.9, adult: Secondary | ICD-10-CM | POA: Diagnosis not present

## 2016-09-20 DIAGNOSIS — Z1389 Encounter for screening for other disorder: Secondary | ICD-10-CM | POA: Diagnosis not present

## 2016-09-20 DIAGNOSIS — M859 Disorder of bone density and structure, unspecified: Secondary | ICD-10-CM | POA: Diagnosis not present

## 2016-10-01 DIAGNOSIS — Z1212 Encounter for screening for malignant neoplasm of rectum: Secondary | ICD-10-CM | POA: Diagnosis not present

## 2016-10-04 ENCOUNTER — Encounter: Payer: Self-pay | Admitting: Physician Assistant

## 2016-10-04 ENCOUNTER — Telehealth: Payer: Self-pay | Admitting: Cardiology

## 2016-10-04 NOTE — Telephone Encounter (Signed)
Pt states this morning she felt something was just right. Pt states she checked her BP and monitor showed irregular heart beat, rate of 68. Pt states BP was 135/74.  Pt states BP a short time ago BP 144/91, heart rate 74, did not indicate irregular pulse.   Pt states she is flying to [place] by herself Wednesday AM to visit her daughter.

## 2016-10-04 NOTE — Progress Notes (Signed)
Cardiology Office Note:    Date:  10/05/2016   ID:  Erika Brown, DOB Jun 03, 1941, MRN [id number]  PCP:  Geoffery Lyons, MD  Cardiologist:  Dr. [name]   Electrophysiologist:  n/a  Referring MD: Burnard Bunting, MD   Chief Complaint  Patient presents with  . Palpitations    History of Present Illness:    Erika Brown is a 75 y.o. female with a hx of CAD s/p NSTEMI 4/06 tx with Cypher DES to LAD and RCA and POBA to Dx, HTN, HL.  Myoview in 2009 was low risk with mild inferior ischemia.  Last seen in this office by Cecilie Kicks, NP in 6/17.  Her beta-blocker dose was reduced due to low BP.    She noted an irregular pulse on her blood pressure monitor yesterday and is added on for evaluation.  She did go to the emergency room several weeks ago for symptoms of vertigo. She had significant nausea and vomiting with this. Head CT and MRI were both negative. Potassium was low. She was feeling well until yesterday. She noted that her blood pressures were elevated and she felt that her heart was racing. This went on for several hours and eventually resolved. Her blood pressure machine did show that she had an irregular pulse. He denies chest pain, shortness of breath, syncope. She denies associated dizziness. She denies orthopnea or pedal edema. She thinks that she may snore. She is not certain if she has had witnessed apneic episodes. Her husband has issues with dementia.  Prior CV studies that were reviewed today include:    Holter 8/17 Predominantly sinus.  Rare PVCs and PACs.   No significant arrhythmias.  LHC 4/06 1.  LEFT MAIN CORONARY ARTERY:  The left main coronary artery was normal. 2.  LEFT ANTERIOR DESCENDING CORONARY ARTERY:  50% proximal LAD, mid 75%-80%, Dx 30%-40%; 1st septal 60% ostial with diff distal vessel disease 3.  LEFT CIRCUMFLEX CORONARY ARTERY:    50% ostial lesion in the ramus 4.  RIGHT CORONARY ARTERY:  40% ostial stenosis, followed by a 99% focal  stenosis; 40% lesion prior to the takeoff of the RV branch.   LEFT VENTRICULOGRAM:  EF 60% with no significant wall motion abnormalities or mitral regurgitation.  PCI 4/06 3.5 x 13 mm Cypher DES to RCA 3 x 28 mm Cypher DES to LAD POBA to Dx  Past Medical History:  Diagnosis Date  . Anxiety and depression   . CAD (coronary artery disease)    S/P NSTEMI 4/06 EF 60%; Treated with Cypher DES to LAD and RCA with kissing balloon PCI diagonal; Treadmill myoview 10/09 for CP: Walked 7:06. mild ST-T-wave changes in recovery. EF 71%. Very mild reversible defect in base of the inferior wall --> medical rx.  . Depression   . Glucose intolerance (impaired glucose tolerance)   . Heart murmur   . History of medication noncompliance   . Hyperlipidemia    Intolerate to multiple statins due to severe myalgias  . Hypertension    Possible white-coat component  . MI (myocardial infarction)    acute-s/p stent  . Microhematuria   . Psoriasis   . Vitamin D deficiency   1. Hyperlipidemia: Intolerant to Lipitor and pravastatin due to myalgias.  Resistant to starting other statins or Zetia.  2. Anxiety 3. HTN 4. CAD: NSTEMI 4/06 with Cypher DES to LAD and RCA and PTCA to diagonal.  Last myoview in 10/09 with EF 71%, and small area of ischemia  at the inferior base.  5. Shellfish allergy 6. Impaired fasting glucose  Past Surgical History:  Procedure Laterality Date  . ABDOMINAL HYSTERECTOMY     and anterior repair  . APPENDECTOMY    . bladder tact    . BREAST BIOPSY    . CORONARY STENT PLACEMENT    . MOHS SURGERY  9/14   BCC.  [facility]  . TONSILLECTOMY AND ADENOIDECTOMY      Current Medications: Current Meds  Medication Sig  . amLODipine (NORVASC) 5 MG tablet Take 1 tablet (5 mg total) by mouth daily.  [name] aspirin 81 MG EC tablet Take 81 mg by mouth daily.    . clopidogrel (PLAVIX) 75 MG tablet Take 1 tablet (75 mg total) by mouth daily.  . ergocalciferol (VITAMIN D2) 50000 UNITS capsule Take  50,000 Units by mouth once a week. On Friday  . meclizine (ANTIVERT) 12.5 MG tablet Take 1 tablet (12.5 mg total) by mouth 3 (three) times daily as needed for dizziness.  . metoprolol succinate (TOPROL-XL) 50 MG 24 hr tablet Take 1/2 tablet by mouth daily  . nitroGLYCERIN (NITROSTAT) 0.4 MG SL tablet DISSOLVE 1 TABLET UNDER TONGUE AS NEEDED FOR CHEST PAIN,MAY REPEAT IN5 MINUTES UP TO 3 DOSES.  [name] potassium chloride SA (K-DUR,KLOR-CON) 20 MEQ tablet Take 1 tablet (20 mEq total) by mouth daily.  [name] triamcinolone cream (KENALOG) 0.1 %   . valsartan-hydrochlorothiazide (DIOVAN-HCT) 320-25 MG tablet Take 1 tablet by mouth daily.     Allergies:   Iodine; Latex; Other; and Statins   Social History   Social History  . Marital status: Married    Spouse name: N/A  . Number of children: 7  . Years of education: N/A   Occupational History  . 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 Smoking status: Former Research scientist (life sciences)  . Smokeless tobacco: Never Used  . Alcohol use 2.4 oz/week    4 Glasses of wine per week     Comment: glass of wine at dinner most nights  . Drug use: No  . Sexual activity: Not Currently    Birth control/ protection: Abstinence, Surgical     Comment: TVH   Other Topics Concern  . None   Social History Narrative   Lives in [place] with her husband   Seven kids   Plays tennis   Very good card player     Family History:  The patient's family history includes Asthma in her brother; Breast cancer (age of onset: 16) in her sister; Cancer in her daughter; Depression in her daughter; Diabetes in her paternal grandmother; Heart attack in her father; Heart disease in her father; Hypertension in her brother and mother; Melanoma in her child and mother; Rectal cancer in her son; Renal Disease in her maternal grandfather; Stroke in her maternal grandfather and maternal grandmother; Ulcers in her father.   ROS:   Please see the history of present  illness.    Review of Systems  Hematologic/Lymphatic: Bruises/bleeds easily.  Psychiatric/Behavioral: The patient is nervous/anxious.    All other systems reviewed and are negative.   EKGs/Labs/Other Test Reviewed:    EKG:  EKG is  ordered today.  The ekg ordered today demonstrates NSR, HR 98, normal axis, nonspecific ST-T wave changes, QTc 426, no sig changes since prior tracing  Recent Labs: 05/12/2016: TSH 1.62 08/31/2016: ALT 23; Hemoglobin 13.3; Platelets 208 10/05/2016: BUN 18; Creat 0.93; Potassium 4.3; Sodium 139   Recent Lipid  Panel    Component Value Date/Time   CHOL 235 (H) 03/02/2016 0859   TRIG 72 03/02/2016 0859   HDL 79 03/02/2016 0859   CHOLHDL 3.0 03/02/2016 0859   VLDL 14 03/02/2016 0859   LDLCALC 142 (H) 03/02/2016 0859   LDLDIRECT 121.7 09/08/2007 0852     Physical Exam:    VS:  BP 130/70   Pulse 98   Ht 5' 4.5" (1.638 m)   Wt 147 lb 6.4 oz (66.9 kg)   LMP 11/29/1974   BMI 24.91 kg/m     Wt Readings from Last 3 Encounters:  10/05/16 147 lb 6.4 oz (66.9 kg)  08/31/16 147 lb (66.7 kg)  05/12/16 146 lb 12.8 oz (66.6 kg)     Physical Exam  Constitutional: She is oriented to person, place, and time. She appears well-developed and well-nourished. No distress.  HENT:  Head: Normocephalic and atraumatic.  Eyes: No scleral icterus.  Neck: Normal range of motion. No JVD present.  Cardiovascular: Normal rate, regular rhythm, S1 normal, S2 normal and normal heart sounds.   No murmur heard. Pulmonary/Chest: Breath sounds normal. She has no wheezes. She has no rhonchi. She has no rales.  Abdominal: Soft. There is no tenderness.  Musculoskeletal: She exhibits no edema.  Neurological: She is alert and oriented to person, place, and time.  Skin: Skin is warm and dry.  Psychiatric: She has a normal mood and affect.    ASSESSMENT:    1. Palpitations   2. Atherosclerosis of native coronary artery of native heart without angina pectoris   3. HYPERTENSION,  BENIGN   4. Pure hypercholesterolemia    PLAN:    In order of problems listed above:  1. Palpitations - She had an episode of rapid palpitations yesterday.  She is leaving for [place] tomorrow to see her new grandchild. She is somewhat nervous about this. Her husband has dementia and neighbors are having to take care of him while she is gone. She did have an extra dose of caffeine the day prior. Question if she had sinus tachycardia versus atrial fibrillation. She has a questionable history of snoring. Her potassium was low in the ED.  We had a long discussion regarding an event monitor or getting an AliveCor monitor for her smart phone at home.  -  She plans to get the AliveCor monitor  -  She can take an extra dose of Toprol 25 mg prn palpitations  -  Get echocardiogram  -  BMET today  2. CAD - s/p NSTEMI in 2006 tx with DES to LAD and RCA and POBA of Dx. Myoview in 2009 was low risk.  No angina.  Continue ASA, Plavix, beta-blocker.  3. HTN - BP controlled.   4. HL - She is intol of statins and Zetia.   Medication Adjustments/Labs and Tests Ordered: Current medicines are reviewed at length with the patient today.  Concerns regarding medicines are outlined above.  Medication changes, Labs and Tests ordered today are outlined in the Patient Instructions noted below. Patient Instructions  Medication Instructions:  Continue current medications. If you feel your heart rate is elevated, you can take Toprol-XL 50 mg instead of 25 mg  Labwork: Today - BMET  Testing/Procedures: Schedule echocardiogram   Follow-Up: Richardson Dopp, PA-C in 2-3 months.  Any Other Special Instructions Will Be Listed Below (If Applicable). You can go to: [email or web address] To look at the device to check your heart rhythm   If you decide you would rather get  a 30 day event monitor through our office, let us know.  If you need a refill on your cardiac medications before your next appointment, please  call your pharmacy.   Signed, Richardson Dopp, PA-C  10/05/2016 5:28 PM    [facility] [address], [place], [place]  [address] Phone: [phone]; Fax: [phone]

## 2016-10-04 NOTE — Telephone Encounter (Signed)
Erika Brown is calling because her monitor is showing that she had a irregular heartbeat this morning , her blood pressure was at 135/74 . Then a couple of hours later he checked her blood pressure and it was high  163/115 at 10:04 am , then 151/99.[name] Her anxiety level is up because she is traveling by herself on Wednesday and she wants to be sure that she is ok to make the trip . Please call

## 2016-10-04 NOTE — Telephone Encounter (Signed)
Pt requesting check up before she leaves for [place] Wed morning. Pt has been scheduled to see Richardson Dopp, PA 10/05/16 at 8:45AM.

## 2016-10-05 ENCOUNTER — Ambulatory Visit (INDEPENDENT_AMBULATORY_CARE_PROVIDER_SITE_OTHER): Payer: PPO | Admitting: Physician Assistant

## 2016-10-05 ENCOUNTER — Encounter: Payer: Self-pay | Admitting: Physician Assistant

## 2016-10-05 VITALS — BP 130/70 | HR 98 | Ht 64.5 in | Wt 147.4 lb

## 2016-10-05 DIAGNOSIS — E7800 Pure hypercholesterolemia, unspecified: Secondary | ICD-10-CM | POA: Diagnosis not present

## 2016-10-05 DIAGNOSIS — R002 Palpitations: Secondary | ICD-10-CM

## 2016-10-05 DIAGNOSIS — I2510 Atherosclerotic heart disease of native coronary artery without angina pectoris: Secondary | ICD-10-CM | POA: Diagnosis not present

## 2016-10-05 DIAGNOSIS — I10 Essential (primary) hypertension: Secondary | ICD-10-CM | POA: Diagnosis not present

## 2016-10-05 LAB — BASIC METABOLIC PANEL
BUN: 18 mg/dL (ref 7–25)
CALCIUM: 10.3 mg/dL (ref 8.6–10.4)
CO2: 29 mmol/L (ref 20–31)
Chloride: 101 mmol/L (ref 98–110)
Creat: 0.93 mg/dL (ref 0.60–0.93)
GLUCOSE: 96 mg/dL (ref 65–99)
Potassium: 4.3 mmol/L (ref 3.5–5.3)
SODIUM: 139 mmol/L (ref 135–146)

## 2016-10-05 NOTE — Progress Notes (Signed)
I think she can followup with Gerald Stabs End.

## 2016-10-05 NOTE — Patient Instructions (Signed)
Medication Instructions:  Continue current medications. If you feel your heart rate is elevated, you can take Toprol-XL 50 mg instead of 25 mg  Labwork: Today - BMET  Testing/Procedures: Schedule echocardiogram   Follow-Up: Richardson Dopp, PA-C in 2-3 months.  Any Other Special Instructions Will Be Listed Below (If Applicable). You can go to: [email or web address] To look at the device to check your heart rhythm   If you decide you would rather get a 30 day event monitor through our office, let us know.  If you need a refill on your cardiac medications before your next appointment, please call your pharmacy.

## 2016-10-06 ENCOUNTER — Telehealth: Payer: Self-pay | Admitting: *Deleted

## 2016-10-06 NOTE — Telephone Encounter (Signed)
lmtcb to go over lab results .  

## 2016-10-26 ENCOUNTER — Ambulatory Visit (HOSPITAL_COMMUNITY): Payer: PPO | Attending: Internal Medicine

## 2016-10-26 ENCOUNTER — Encounter: Payer: Self-pay | Admitting: Physician Assistant

## 2016-10-26 ENCOUNTER — Other Ambulatory Visit: Payer: Self-pay

## 2016-10-26 DIAGNOSIS — I340 Nonrheumatic mitral (valve) insufficiency: Secondary | ICD-10-CM | POA: Insufficient documentation

## 2016-10-26 DIAGNOSIS — R002 Palpitations: Secondary | ICD-10-CM | POA: Diagnosis not present

## 2016-10-26 DIAGNOSIS — I2510 Atherosclerotic heart disease of native coronary artery without angina pectoris: Secondary | ICD-10-CM | POA: Diagnosis not present

## 2016-10-27 ENCOUNTER — Telehealth: Payer: Self-pay | Admitting: *Deleted

## 2016-10-27 NOTE — Telephone Encounter (Signed)
Lvm with pt's husband asking for ptcb to go over echo results

## 2016-10-28 ENCOUNTER — Telehealth: Payer: Self-pay | Admitting: Physician Assistant

## 2016-10-28 NOTE — Telephone Encounter (Signed)
Line busy

## 2016-10-28 NOTE — Telephone Encounter (Signed)
Duplicate

## 2016-10-28 NOTE — Telephone Encounter (Signed)
Ok.  If she has more palpitations, she can call to arrange earlier. Or, if she wants, she can look into the AliveCor device that we discussed at her last visit if that is more convenient. Richardson Dopp, PA-C   10/28/2016 10:15 AM

## 2016-10-28 NOTE — Telephone Encounter (Signed)
New message  Pt is returning call from Erika Brown  Please call back

## 2016-10-28 NOTE — Telephone Encounter (Signed)
Pt requesting ECHO results. I reviewed those with her. She voiced understanding. She states Scott told her she may wear event monitor for 4 weeks. She states "the holidays" are coming up and she will not wear one during that time. She states she will wait and discuss monitor at appt in Feb.

## 2016-10-29 NOTE — Telephone Encounter (Signed)
I spoke with patient this am. I advised her of your recommendations. She states she will call back during the holidays if she has any issues, otherwise, she will call back after the holidays. She states she will not wear a monitor, but will consider the AliceCor option.

## 2016-10-29 NOTE — Telephone Encounter (Signed)
Ok [place] [address], [place]   10/29/2016 11:42 AM

## 2016-11-12 ENCOUNTER — Other Ambulatory Visit: Payer: Self-pay | Admitting: Cardiology

## 2016-11-12 NOTE — Telephone Encounter (Signed)
[name] the directions for the Metoprolol should read take 1/2 tablet daily = 25 mg daily; may take extra 25 mg prn for palpitations.

## 2016-11-12 NOTE — Telephone Encounter (Signed)
Per last office visit with Erika Brown, patient is taking 25 mg qd, and in his dictation he indicated that it is okay for the patient to take and extra 25 mg prn for palpitations. Okay to refill with a sig as above or should it be refilled only for 25 mg qd? Please advise. Thanks, MI

## 2016-11-12 NOTE — Telephone Encounter (Signed)
[name]

## 2017-01-05 DIAGNOSIS — L821 Other seborrheic keratosis: Secondary | ICD-10-CM | POA: Diagnosis not present

## 2017-01-05 DIAGNOSIS — D225 Melanocytic nevi of trunk: Secondary | ICD-10-CM | POA: Diagnosis not present

## 2017-01-05 DIAGNOSIS — L710 Perioral dermatitis: Secondary | ICD-10-CM | POA: Diagnosis not present

## 2017-01-05 DIAGNOSIS — Z85828 Personal history of other malignant neoplasm of skin: Secondary | ICD-10-CM | POA: Diagnosis not present

## 2017-01-05 DIAGNOSIS — L570 Actinic keratosis: Secondary | ICD-10-CM | POA: Diagnosis not present

## 2017-01-05 DIAGNOSIS — L2089 Other atopic dermatitis: Secondary | ICD-10-CM | POA: Diagnosis not present

## 2017-01-07 ENCOUNTER — Ambulatory Visit (INDEPENDENT_AMBULATORY_CARE_PROVIDER_SITE_OTHER): Payer: PPO | Admitting: Physician Assistant

## 2017-01-07 ENCOUNTER — Encounter: Payer: Self-pay | Admitting: Physician Assistant

## 2017-01-07 VITALS — BP 148/70 | HR 71 | Ht 64.5 in | Wt 150.4 lb

## 2017-01-07 DIAGNOSIS — R002 Palpitations: Secondary | ICD-10-CM | POA: Diagnosis not present

## 2017-01-07 DIAGNOSIS — I10 Essential (primary) hypertension: Secondary | ICD-10-CM

## 2017-01-07 DIAGNOSIS — E7800 Pure hypercholesterolemia, unspecified: Secondary | ICD-10-CM | POA: Insufficient documentation

## 2017-01-07 DIAGNOSIS — I2510 Atherosclerotic heart disease of native coronary artery without angina pectoris: Secondary | ICD-10-CM

## 2017-01-07 MED ORDER — METOPROLOL SUCCINATE ER 25 MG PO TB24: 25.0000 mg | ORAL_TABLET | Freq: Every day | ORAL | 3 refills | Status: DC

## 2017-01-07 NOTE — Patient Instructions (Addendum)
Medication Instructions:  TOPROL XL 25 MG TABLET WAS SENT IN TODAY # 90 X 3   Labwork: None   Testing/Procedures: None   Follow-Up: Richardson Dopp, PA-C in 6 months.   Any Other Special Instructions Will Be Listed Below (If Applicable).  If you need a refill on your cardiac medications before your next appointment, please call your pharmacy.   Look for this web site or you can go to [employer] to find the device to check your heart rhythm.

## 2017-01-07 NOTE — Progress Notes (Signed)
Cardiology Office Note:    Date:  01/07/2017   ID:  Erika Brown, DOB [birth year], MRN [id number]  PCP:  Erika Lyons, MD  Cardiologist:  Dr. Loralie Brown >> Dr. Harrell Brown End   Electrophysiologist:  n/a  Referring MD: Erika Bunting, MD   Chief Complaint  Patient presents with  . Follow-up    CAD, palpitations    History of Present Illness:    Erika Brown is a 76 y.o. female with a hx of CAD s/p NSTEMI 4/06 tx with Cypher DES to LAD and RCA and POBA to Dx, HTN, HL.  Myoview in 2009 was low risk with mild inferior ischemia.  Last seen 11/17. She complained of rapid palpitations. Echocardiogram was obtained and demonstrated normal function. We discussed event monitoring versus purchasing a personal device. She ultimately held off on pursuing monitoring.  She returns for Cardiology follow up.  Since last seen, she visited her daughter in [place] who just Brown birth to her 2nd child.  The patient has been doing well and she denies chest pain, shortness of breath, syncope, orthopnea, PND or significant pedal edema.  She has had another episode of palpitations.  But, this was brief.    Prior CV studies:   The following studies were reviewed today:  Echo 11/17:  Vigorous LVF, EF 65-70, normal wall motion, borderline diastolic function, systolic bowing of mitral valve without prolapse, mild MR, trivial TR, PASP 20  Holter 8/17 Predominantly sinus.  Rare PVCs and PACs.  No significant arrhythmias.  LHC 4/06 1. LEFT MAIN CORONARY ARTERY: The left main coronary artery was normal. 2. LEFT ANTERIOR DESCENDING CORONARY ARTERY: 50% proximal LAD, mid 75%-80%, Dx 30%-40%; 1st septal 60% ostial with diff distal vessel disease 3. LEFT CIRCUMFLEX CORONARY ARTERY:   50% ostial lesionin the ramus 4. RIGHT CORONARY ARTERY: 40% ostial stenosis, followed by a 99% focal stenosis; 40% lesion prior to the takeoff of the [place].  LEFT VENTRICULOGRAM: EF 60% with no significant  wall motion abnormalities or mitralregurgitation.  PCI 4/06 3.5 x 13 mm Cypher DES to RCA 3 x 28 mm Cypher DES to LAD POBA to Dx  Past Medical History:  Diagnosis Date  . Anxiety and depression   . CAD (coronary artery disease)    S/P NSTEMI 4/06 EF 60%; Treated with Cypher DES to LAD and RCA with kissing balloon PCI diagonal; Treadmill myoview 10/09 for CP: Walked 7:06. mild ST-T-wave changes in recovery. EF 71%. Very mild reversible defect in base of the inferior wall --> medical rx.  . Depression   . Glucose intolerance (impaired glucose tolerance)   . Heart murmur   . History of echocardiogram    Echo 11/17: Vigorous LVF, EF 65-70, normal wall motion, borderline diastolic function, systolic bowing of mitral valve without prolapse, mild MR, trivial TR, PASP 20  . History of medication noncompliance   . Hyperlipidemia    Intolerate to multiple statins due to severe myalgias  . Hypertension    Possible white-coat component  . MI (myocardial infarction)    acute-s/p stent  . Microhematuria   . Psoriasis   . Vitamin D deficiency   1. Hyperlipidemia: Intolerant to Lipitor and pravastatin due to myalgias. Resistant to starting other statins or Zetia.  2. Anxiety 3. HTN 4. CAD: NSTEMI 4/06 with Cypher DES to LAD and RCA and PTCA to diagonal. Last myoview in 10/09 with EF 71%, and small area of ischemia at the inferior base.  5. Shellfish allergy 6. Impaired fasting  glucose  Past Surgical History:  Procedure Laterality Date  . ABDOMINAL HYSTERECTOMY     and anterior repair  . APPENDECTOMY    . bladder tact    . BREAST BIOPSY    . CORONARY STENT PLACEMENT    . MOHS SURGERY  9/14   BCC.  [facility]  . TONSILLECTOMY AND ADENOIDECTOMY      Current Medications: Current Meds  Medication Sig  . amLODipine (NORVASC) 5 MG tablet Take 1 tablet (5 mg total) by mouth daily.  [name] aspirin 81 MG EC tablet Take 81 mg by mouth daily.    . clopidogrel (PLAVIX) 75 MG tablet Take 1 tablet  (75 mg total) by mouth daily.  . ergocalciferol (VITAMIN D2) 50000 UNITS capsule Take 50,000 Units by mouth once a week. On Friday  . meclizine (ANTIVERT) 12.5 MG tablet TAKE 1/2 TABLET BY MOUTH 3 (THREE )N TIMES DAILY AS NEEDED FOR DIZZINESS  . nitroGLYCERIN (NITROSTAT) 0.4 MG SL tablet DISSOLVE 1 TABLET UNDER TONGUE AS NEEDED FOR CHEST PAIN,MAY REPEAT IN5 MINUTES UP TO 3 DOSES.  [name] potassium chloride SA (K-DUR,KLOR-CON) 20 MEQ tablet Take 1 tablet (20 mEq total) by mouth daily.  [name] triamcinolone cream (KENALOG) 0.1 % Apply 1 application topically 2 (two) times daily.   . valsartan-hydrochlorothiazide (DIOVAN-HCT) 320-25 MG tablet Take 1 tablet by mouth daily.  . [DISCONTINUED] metoprolol succinate (TOPROL-XL) 50 MG 24 hr tablet Take one half tablet by mouth daily. May take an additional one half tablet by mouth as needed for palpitations     Allergies:   Iodine; Latex; Other; and Statins   Social History   Social History  . Marital status: Married    Spouse name: N/A  . Number of children: 7  . Years of education: N/A   Occupational History  . 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 Smoking status: Former Research scientist (life sciences)  . Smokeless tobacco: Never Used  . Alcohol use 2.4 oz/week    4 Glasses of wine per week     Comment: glass of wine at dinner most nights  . Drug use: No  . Sexual activity: Not Currently    Birth control/ protection: Abstinence, Surgical     Comment: TVH   Other Topics Concern  . None   Social History Narrative   Lives in [place] with her husband   Seven kids   Plays tennis   Very good card player     Family History  Problem Relation Age of Onset  . Heart disease Father   . Heart attack Father   . Ulcers Father   . Hypertension Mother   . Melanoma Mother   . Cancer Daughter     melanoma  . Stroke Maternal Grandmother   . Stroke Maternal Grandfather   . Renal Disease Maternal Grandfather     kidney removed  .  Diabetes Paternal Grandmother   . Melanoma Child     Metastatic  . Breast cancer Sister 34  . Hypertension Brother   . Asthma Brother   . Depression Daughter     x2  . Rectal cancer Son   . Coronary artery disease Neg Hx      ROS:   Please see the history of present illness.    Review of Systems  Cardiovascular: Positive for irregular heartbeat.  Respiratory: Positive for snoring.   Skin: Positive for rash.   All other systems reviewed and are negative.   EKGs/Labs/Other  Test Reviewed:    EKG:  EKG is  ordered today.  The ekg ordered today demonstrates NSR, HR 71, normal axis, QTc 419 ms, no changes.   Recent Labs: 05/12/2016: TSH 1.62 08/31/2016: ALT 23; Hemoglobin 13.3; Platelets 208 10/05/2016: BUN 18; Creat 0.93; Potassium 4.3; Sodium 139   Recent Lipid Panel    Component Value Date/Time   CHOL 235 (H) 03/02/2016 0859   TRIG 72 03/02/2016 0859   HDL 79 03/02/2016 0859   CHOLHDL 3.0 03/02/2016 0859   VLDL 14 03/02/2016 0859   LDLCALC 142 (H) 03/02/2016 0859   LDLDIRECT 121.7 09/08/2007 0852    Physical Exam:    VS:  BP (!) 148/70   Pulse 71   Ht 5' 4.5" (1.638 m)   Wt 150 lb 6.4 oz (68.2 kg)   LMP 11/29/1974   BMI 25.42 kg/m     Wt Readings from Last 3 Encounters:  01/07/17 150 lb 6.4 oz (68.2 kg)  10/05/16 147 lb 6.4 oz (66.9 kg)  08/31/16 147 lb (66.7 kg)     Physical Exam  Constitutional: She is oriented to person, place, and time. She appears well-developed and well-nourished. No distress.  HENT:  Head: Normocephalic and atraumatic.  Eyes: No scleral icterus.  Neck: No JVD present.  Cardiovascular: Normal rate, regular rhythm and normal heart sounds.   No murmur heard. Pulmonary/Chest: Effort normal. She has no wheezes. She has no rales.  Abdominal: Soft. There is no tenderness.  Musculoskeletal: She exhibits no edema.  Neurological: She is alert and oriented to person, place, and time.  Skin: Skin is warm and dry.  Psychiatric: She has a  normal mood and affect.    ASSESSMENT:    1. Palpitations   2. Coronary artery disease involving native coronary artery of native heart without angina pectoris   3. Essential hypertension   4. Pure hypercholesterolemia    PLAN:    In order of problems listed above:  1. Palpitations -  These are rare.  She has irregularities noted on her BP monitor.  We again discussed monitoring.  She would like to try to get the [employer] device.  I helped her install the app on her phone and Brown her the web site info to look into this further.   2. CAD - s/p NSTEMI in 2006 tx with DES to LAD and RCA and POBA of Dx.  Myoview in 2009 was low risk.  She denies angina.  Continue ASA, Plavix, beta blocker.  She is intol of statins.   3. HTN - BP with borderline control.  Continue current Rx.  4. HL - She is intol of statins and Zetia.  We discussed PCSK9i Rx and the Orion trial.  She is not interested in trying these.     Medication Adjustments/Labs and Tests Ordered: Current medicines are reviewed at length with the patient today.  Concerns regarding medicines are outlined above.  Medication changes, Labs and Tests ordered today are outlined in the Patient Instructions noted below. Patient Instructions  Medication Instructions:  TOPROL XL 25 MG TABLET WAS SENT IN TODAY # 90 X 3   Labwork: None   Testing/Procedures: None   Follow-Up: Erika Dopp, PA-C in 6 months.   Any Other Special Instructions Will Be Listed Below (If Applicable).  If you need a refill on your cardiac medications before your next appointment, please call your pharmacy.   Look for this web site or you can go to [employer] to find the device  to check your heart rhythm.   Return in about 6 months (around 07/07/2017) for Routine Follow Up, w/ Erika Dopp, PA-C.   Signed, Erika Dopp, PA-C  01/07/2017 12:42 PM    [facility] [place], [place], [place]  [address] Phone: [phone]; Fax: [phone]

## 2017-01-13 DIAGNOSIS — H00022 Hordeolum internum right lower eyelid: Secondary | ICD-10-CM | POA: Diagnosis not present

## 2017-03-11 DIAGNOSIS — H00025 Hordeolum internum left lower eyelid: Secondary | ICD-10-CM | POA: Diagnosis not present

## 2017-05-13 ENCOUNTER — Other Ambulatory Visit: Payer: Self-pay | Admitting: Cardiology

## 2017-05-13 NOTE — Telephone Encounter (Signed)
Followed by [facility]  

## 2017-07-05 ENCOUNTER — Ambulatory Visit (INDEPENDENT_AMBULATORY_CARE_PROVIDER_SITE_OTHER): Payer: PPO | Admitting: Obstetrics & Gynecology

## 2017-07-05 ENCOUNTER — Encounter: Payer: Self-pay | Admitting: Obstetrics & Gynecology

## 2017-07-05 VITALS — BP 120/80 | HR 70 | Resp 14 | Ht 64.0 in | Wt 150.5 lb

## 2017-07-05 DIAGNOSIS — Z01419 Encounter for gynecological examination (general) (routine) without abnormal findings: Secondary | ICD-10-CM

## 2017-07-05 NOTE — Progress Notes (Signed)
76 y.o. G77P7 Married Caucasian F here for annual exam.  Doing well.  Husband having some mild cognitive impairment that causes frustration.   Had some issues with anal fissure last year.  Has not had any new issues.  Refuses to see gastroenterology.  Refuses colonoscopy.    Denies vaginal bleeding.    PCP:  Dr. Reynaldo Minium.  Has appt in three weeks.   Cardiology:   Richardson Dopp, PA.  Has appt scheduled 8/10.    Patient's last menstrual period was 11/29/1974.          Sexually active: No.  The current method of family planning is status post hysterectomy.    Exercising: Yes.    stairs, gardening Smoker:  Former smoker   Health Maintenance: Pap:  11/2009 negative  History of abnormal Pap:  yes MMG:  2008 BIRADS 1 negative- declines additional  Colonoscopy:  08/20/2004- declines additional  BMD:   2008- declines additional   TDaP:  PCP  Pneumonia vaccine(s):  PCP Zostavax:   never Hep C testing: not indicated  Screening Labs: discuss with provider, Hb today: discuss with provider   reports that she has quit smoking. She has never used smokeless tobacco. She reports that she drinks about 2.4 oz of alcohol per week . She reports that she does not use drugs.  Past Medical History:  Diagnosis Date  . Anxiety and depression   . CAD (coronary artery disease)    S/P NSTEMI 4/06 EF 60%; Treated with Cypher DES to LAD and RCA with kissing balloon PCI diagonal; Treadmill myoview 10/09 for CP: Walked 7:06. mild ST-T-wave changes in recovery. EF 71%. Very mild reversible defect in base of the inferior wall --> medical rx.  . Depression   . Glucose intolerance (impaired glucose tolerance)   . Heart murmur   . History of echocardiogram    Echo 11/17: Vigorous LVF, EF 65-70, normal wall motion, borderline diastolic function, systolic bowing of mitral valve without prolapse, mild MR, trivial TR, PASP 20  . History of medication noncompliance   . Hyperlipidemia    Intolerate to multiple statins due to  severe myalgias  . Hypertension    Possible white-coat component  . MI (myocardial infarction) ([facility])    acute-s/p stent  . Microhematuria   . Psoriasis   . Vitamin D deficiency     Past Surgical History:  Procedure Laterality Date  . ABDOMINAL HYSTERECTOMY     and anterior repair  . APPENDECTOMY    . bladder tact    . BREAST BIOPSY    . CATARACT EXTRACTION Bilateral 2017   Dr. Katy Fitch  . CORONARY STENT PLACEMENT    . MOHS SURGERY  9/14   BCC.  [facility]  . TONSILLECTOMY AND ADENOIDECTOMY      Current Outpatient Prescriptions  Medication Sig Dispense Refill  . amLODipine (NORVASC) 5 MG tablet Take 1 tablet (5 mg total) by mouth daily. 90 tablet 2  . aspirin 81 MG EC tablet Take 81 mg by mouth daily.      . clopidogrel (PLAVIX) 75 MG tablet Take 1 tablet (75 mg total) by mouth daily. 90 tablet 2  . ergocalciferol (VITAMIN D2) 50000 UNITS capsule Take 50,000 Units by mouth once a week. On Friday    . meclizine (ANTIVERT) 12.5 MG tablet TAKE 1/2 TABLET BY MOUTH 3 (THREE )N TIMES DAILY AS NEEDED FOR DIZZINESS    . metoprolol succinate (TOPROL XL) 25 MG 24 hr tablet Take 1 tablet (25 mg total)  by mouth daily. 90 tablet 3  . nitroGLYCERIN (NITROSTAT) 0.4 MG SL tablet DISSOLVE 1 TABLET UNDER TONGUE AS NEEDED FOR CHEST PAIN,MAY REPEAT IN5 MINUTES UP TO 3 DOSES. 25 tablet 3  . potassium chloride SA (K-DUR,KLOR-CON) 20 MEQ tablet Take 1 tablet (20 mEq total) by mouth daily. 90 tablet 2  . valsartan-hydrochlorothiazide (DIOVAN-HCT) 320-25 MG tablet Take 1 tablet by mouth daily. 90 tablet 2   No current facility-administered medications for this visit.     Family History  Problem Relation Age of Onset  . Heart disease Father   . Heart attack Father   . Ulcers Father   . Hypertension Mother   . Melanoma Mother   . Cancer Daughter        melanoma  . Stroke Maternal Grandmother   . Stroke Maternal Grandfather   . Renal Disease Maternal Grandfather        kidney removed  . Diabetes  Paternal Grandmother   . Melanoma Child        Metastatic  . Breast cancer Sister 53  . Hypertension Brother   . Asthma Brother   . Depression Daughter        x2  . Rectal cancer Son   . Coronary artery disease Neg Hx     ROS:  Pertinent items are noted in HPI.  Otherwise, a comprehensive ROS was negative.  Exam:   BP 120/80 (BP Location: Left Arm, Patient Position: Sitting, Cuff Size: Normal)   Pulse 70   Resp 14   Ht 5\' 4"  (1.626 m)   Wt 150 lb 8 oz (68.3 kg)   LMP 11/29/1974   BMI 25.83 kg/m   Weight change: @WEIGHTCHANGE @ Height:   Height: 5\' 4"  (162.6 cm)  Ht Readings from Last 3 Encounters:  07/05/17 5\' 4"  (1.626 m)  01/07/17 5' 4.5" (1.638 m)  10/05/16 5' 4.5" (1.638 m)    General appearance: alert, cooperative and appears stated age Head: Normocephalic, without obvious abnormality, atraumatic Neck: no adenopathy, supple, symmetrical, trachea midline and thyroid normal to inspection and palpation Lungs: clear to auscultation bilaterally Breasts: normal appearance, no masses or tenderness Heart: regular rate and rhythm Abdomen: soft, non-tender; bowel sounds normal; no masses,  no organomegaly Extremities: extremities normal, atraumatic, no cyanosis or edema Skin: Skin color, texture, turgor normal. No rashes or lesions Lymph nodes: Cervical, supraclavicular, and axillary nodes normal. No abnormal inguinal nodes palpated Neurologic: Grossly normal   Pelvic: External genitalia:  no lesions              Urethra:  normal appearing urethra with no masses, tenderness or lesions              Bartholins and Skenes: normal                 Vagina: normal appearing vagina with normal color and discharge, no lesions              Cervix: absent              Pap taken: No. Bimanual Exam:  Uterus:  normal size, contour, position, consistency, mobility, non-tender              Adnexa: no mass, fullness, tenderness               Rectovaginal: Confirms               Anus:   normal sphincter tone, no lesions  Chaperone was present for exam.  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  Pt declined having another one.  Knows I disagree with this decision Declines colonoscopy.  Again, aware I disagree with this decision. Declines repeating MBD. pap smear not indicated Sees Dr. Reynaldo Minium yearly.  UTD with lab work.  Declines any new vaccinations.  Return annually or prn

## 2017-07-08 ENCOUNTER — Encounter: Payer: Self-pay | Admitting: Physician Assistant

## 2017-07-08 ENCOUNTER — Ambulatory Visit (INDEPENDENT_AMBULATORY_CARE_PROVIDER_SITE_OTHER): Payer: PPO | Admitting: Physician Assistant

## 2017-07-08 VITALS — BP 140/80 | HR 72 | Ht 64.0 in | Wt 151.2 lb

## 2017-07-08 DIAGNOSIS — I2510 Atherosclerotic heart disease of native coronary artery without angina pectoris: Secondary | ICD-10-CM | POA: Diagnosis not present

## 2017-07-08 DIAGNOSIS — E7800 Pure hypercholesterolemia, unspecified: Secondary | ICD-10-CM | POA: Diagnosis not present

## 2017-07-08 DIAGNOSIS — R002 Palpitations: Secondary | ICD-10-CM

## 2017-07-08 DIAGNOSIS — I10 Essential (primary) hypertension: Secondary | ICD-10-CM | POA: Diagnosis not present

## 2017-07-08 MED ORDER — VALSARTAN-HYDROCHLOROTHIAZIDE 320-12.5 MG PO TABS: 1.0000 | ORAL_TABLET | Freq: Every day | ORAL | 3 refills | Status: DC

## 2017-07-08 NOTE — Progress Notes (Signed)
Cardiology Office Note:    Date:  07/08/2017   ID:  Erika Brown, DOB Oct 25, 1941, MRN [id number]  PCP:  Burnard Bunting, MD  Cardiologist:  Dr. [name] >> Dr. Nelva Bush    Referring MD: Burnard Bunting, MD   Chief Complaint  Patient presents with  . Coronary Artery Disease    follow up    History of Present Illness:    Erika Brown is a 76 y.o. female with a hx of CAD s/p NSTEMI 4/06 tx with Cypher DES to LAD, Cypher DES to the RCA and POBA to Dx, HTN, HL, palpitations. Myoview in 2009 was low risk with mild inferior ischemia.  Echocardiogram 11/17 demonstrated normal function.  Last seen in 2/18.  She noted intermittent palpitations. After further discussion, she decided to pursue purchasing the [employer] for her smart phone.    Erika Brown returns for Cardiology follow up.  She is here alone.  No pain, swelling or difficulty or restriction in movement of the shoulders.  She has had rare palpitations since last seen.  She did get the [place] mobile device.    Prior CV studies:   The following studies were reviewed today:  Echo 11/17:  Vigorous LVF, EF 65-70, normal wall motion, borderline diastolic function, systolic bowing of mitral valve without prolapse, mild MR, trivial TR, PASP 20  Holter 8/17 Predominantly sinus.  Rare PVCs and PACs.  No significant arrhythmias.  LHC 4/06 1. LEFT MAIN CORONARY ARTERY: The left main coronary artery was normal. 2. LEFT ANTERIOR DESCENDING CORONARY ARTERY: 50% proximal LAD, mid75%-80%, Dx 30%-40%; 1st septal 60% ostial with diff distal vessel disease 3. LEFT CIRCUMFLEX CORONARY ARTERY: 50% ostial lesionin the ramus 4. RIGHT CORONARY ARTERY: 40% ostial stenosis, followed by a 99% focal stenosis; 40% lesion prior to the takeoff of the Fate.  LEFT VENTRICULOGRAM: EF 60% with no significant wall motion abnormalities or mitralregurgitation.  PCI 4/06 3.5 x 13 mm Cypher DES to RCA 3 x 28 mm  Cypher DES to LAD POBA to Dx  Past Medical History:  Diagnosis Date  . Anxiety and depression   . CAD (coronary artery disease)    S/P NSTEMI 4/06 EF 60%; Treated with Cypher DES to LAD and RCA with kissing balloon PCI diagonal; Treadmill myoview 10/09 for CP: Walked 7:06. mild ST-T-wave changes in recovery. EF 71%. Very mild reversible defect in base of the inferior wall --> medical rx.  . Depression   . Glucose intolerance (impaired glucose tolerance)   . Heart murmur   . History of echocardiogram    Echo 11/17: Vigorous LVF, EF 65-70, normal wall motion, borderline diastolic function, systolic bowing of mitral valve without prolapse, mild MR, trivial TR, PASP 20  . History of medication noncompliance   . Hyperlipidemia    Intolerate to multiple statins due to severe myalgias  . Hypertension    Possible white-coat component  . MI (myocardial infarction) ([place])    acute-s/p stent  . Microhematuria   . Psoriasis   . Vitamin D deficiency     Past Surgical History:  Procedure Laterality Date  . ABDOMINAL HYSTERECTOMY     and anterior repair  . APPENDECTOMY    . bladder tact    . BREAST BIOPSY    . CATARACT EXTRACTION Bilateral 2017   Dr. Katy Fitch  . CORONARY STENT PLACEMENT    . MOHS SURGERY  9/14   BCC.  [facility]  . TONSILLECTOMY AND ADENOIDECTOMY  Current Medications: Current Meds  Medication Sig  . amLODipine (NORVASC) 5 MG tablet Take 1 tablet (5 mg total) by mouth daily.  [name] aspirin 81 MG EC tablet Take 81 mg by mouth daily.    . clopidogrel (PLAVIX) 75 MG tablet Take 1 tablet (75 mg total) by mouth daily.  . ergocalciferol (VITAMIN D2) 50000 UNITS capsule Take 50,000 Units by mouth once a week. On Friday  . meclizine (ANTIVERT) 12.5 MG tablet TAKE 1/2 TABLET BY MOUTH 3 (THREE )N TIMES DAILY AS NEEDED FOR DIZZINESS  . metoprolol succinate (TOPROL XL) 25 MG 24 hr tablet Take 1 tablet (25 mg total) by mouth daily.  . nitroGLYCERIN (NITROSTAT) 0.4 MG SL tablet  DISSOLVE 1 TABLET UNDER TONGUE AS NEEDED FOR CHEST PAIN,MAY REPEAT IN5 MINUTES UP TO 3 DOSES.  [name] potassium chloride SA (K-DUR,KLOR-CON) 20 MEQ tablet Take 1 tablet (20 mEq total) by mouth daily.  . [DISCONTINUED] valsartan-hydrochlorothiazide (DIOVAN-HCT) 320-25 MG tablet Take 1 tablet by mouth daily.     Allergies:   Iodine; Latex; Other; and Statins   Social History   Social History  . Marital status: Married    Spouse name: N/A  . Number of children: 7  . Years of education: N/A   Occupational History  . Works at home, Charity fundraiser, [employer]  . Smoking status: Former Research scientist (life sciences)  . Smokeless tobacco: Never Used  . Alcohol use 2.4 oz/week    4 Glasses of wine per week     Comment: glass of wine at dinner most nights  . Drug use: No  . Sexual activity: Not Currently    Birth control/ protection: Abstinence, Surgical     Comment: TVH   Other Topics Concern  . None   Social History Narrative   Lives in [place] with her husband   Seven kids   Plays tennis   Very good card player     Family Hx: The patient's family history includes Asthma in her brother; Breast cancer (age of onset: 59) in her sister; Cancer in her daughter; Depression in her daughter; Diabetes in her paternal grandmother; Heart attack in her father; Heart disease in her father; Hypertension in her brother and mother; Melanoma in her child and mother; Rectal cancer in her son; Renal Disease in her maternal grandfather; Stroke in her maternal grandfather and maternal grandmother; Ulcers in her father. There is no history of Coronary artery disease.  ROS:   Please see the history of present illness.    ROS All other systems reviewed and are negative.   EKGs/Labs/Other Test Reviewed:    EKG:  EKG is  ordered today.  The ekg ordered today demonstrates NSR, HR 72, normal axis, PACs. QTc 418 ms, no changes.   Recent Labs: 08/31/2016: ALT 23; Hemoglobin 13.3; Platelets  208 10/05/2016: BUN 18; Creat 0.93; Potassium 4.3; Sodium 139   Recent Lipid Panel Lab Results  Component Value Date/Time   CHOL 235 (H) 03/02/2016 08:59 AM   TRIG 72 03/02/2016 08:59 AM   HDL 79 03/02/2016 08:59 AM   CHOLHDL 3.0 03/02/2016 08:59 AM   LDLCALC 142 (H) 03/02/2016 08:59 AM   LDLDIRECT 121.7 09/08/2007 08:52 AM    Physical Exam:    VS:  BP 140/80   Pulse 72   Ht 5\' 4"  (1.626 m)   Wt 151 lb 3.2 oz (68.6 kg)   LMP 11/29/1974   BMI 25.95 kg/m     Wt Readings from  Last 3 Encounters:  07/08/17 151 lb 3.2 oz (68.6 kg)  07/05/17 150 lb 8 oz (68.3 kg)  01/07/17 150 lb 6.4 oz (68.2 kg)     Physical Exam  Constitutional: She is oriented to person, place, and time. She appears well-developed and well-nourished. No distress.  HENT:  Head: Normocephalic and atraumatic.  Eyes: No scleral icterus.  Neck: Normal range of motion. No JVD present.  Cardiovascular: Normal rate, regular rhythm, S1 normal, S2 normal and normal heart sounds.   No murmur heard. Pulmonary/Chest: Breath sounds normal. She has no wheezes. She has no rhonchi. She has no rales.  Abdominal: Soft. There is no tenderness.  Musculoskeletal: She exhibits no edema.  Neurological: She is alert and oriented to person, place, and time.  Skin: Skin is warm and dry.  Psychiatric: She has a normal mood and affect.    ASSESSMENT:    1. Coronary artery disease involving native coronary artery of native heart without angina pectoris   2. Essential hypertension   3. Pure hypercholesterolemia   4. Palpitations    PLAN:    In order of problems listed above:  1. Coronary artery disease involving native coronary artery of native heart without angina pectoris S/p NSTEMI in 2006 tx with DES to the LAD, DEs to the RCA and POBA to the Dx.  She denies angina.  Continue ASA, Clopidogrel.  She is intol of statins.    2. Essential hypertension BP is borderline today.  But, it is much lower at home.  She is  symptomatic with SBP in the 90s at home at times.   -  Decrease Diovan-HCT to 320/12.5 QD  -  BMET in 2 weeks  -  She will check with her pharmacy to see if her Valsartan is on recall.  3. Pure hypercholesterolemia We discussed referral to the [facility] for PCSK-9i Rx.  But she declines.  4. Palpitations Her palpitations are minimal.  She has a [place] device and I helped her set it up on her phone today. She will monitor her rhythm at home and follow up sooner if needed.   Dispo:  Return in about 1 year (around 07/08/2018) for Routine Follow Up, w/ Dr. Saunders Revel, or Richardson Dopp, PA-C.   Medication Adjustments/Labs and Tests Ordered: Current medicines are reviewed at length with the patient today.  Concerns regarding medicines are outlined above.  Tests Ordered: Orders Placed This Encounter  Procedures  . Basic metabolic panel  . EKG 12-Lead   Medication Changes: Meds ordered this encounter  Medications  . valsartan-hydrochlorothiazide (DIOVAN HCT) 320-12.5 MG tablet    Sig: Take 1 tablet by mouth daily.    Dispense:  90 tablet    Refill:  3    Signed, Richardson Dopp, PA-C  07/08/2017 10:43 AM    [address], [place], [place]  [address] Phone: [phone]; Fax: [phone]

## 2017-07-08 NOTE — Patient Instructions (Addendum)
Medication Instructions:  1. Decrease Diovan-HCT to 320/12.5 mg Once daily   Labwork: 1. In 2 weeks - BMET   Testing/Procedures: None   Follow-Up: 1. Your physician wants you to follow-up in: [place] will receive a reminder letter in the mail two months in advance. If you don't receive a letter, please call our office to schedule the follow-up appointment.   Any Other Special Instructions Will Be Listed Below (If Applicable). Please ask you pharmacy if your Diovan is on recall.  If it is, call us so we can change it to a different drug.  If you need a refill on your cardiac medications before your next appointment, please call your pharmacy.

## 2017-07-11 ENCOUNTER — Telehealth: Payer: Self-pay | Admitting: Physician Assistant

## 2017-07-11 MED ORDER — HYDROCHLOROTHIAZIDE 25 MG PO TABS: ORAL_TABLET | ORAL | 1 refills | Status: DC

## 2017-07-11 MED ORDER — IRBESARTAN 300 MG PO TABS: 300.0000 mg | ORAL_TABLET | Freq: Every day | ORAL | 1 refills | Status: DC

## 2017-07-11 NOTE — Telephone Encounter (Signed)
New message   Pt states that someone was supposed to change her Valsartan medication and call her back with the new medication after sending it to the pharmacy. She wants to  Know what the new medication is and when she can pick it up.

## 2017-07-11 NOTE — Telephone Encounter (Signed)
Pt confirmed that Nicki Reaper was going to decrease her valsartan/HCT to 320/12.5 mg daily 07/08/17, that will be her new dose. Per protocol replacement would be irbesartan/HCTZ 300/12.5 mg--this is not available. Per pharmacy-will have to replace with irbesartan 300 mg daily and HCTZ 12.5 mg daily, pt aware of this, 90 day prescriptions have been sent to [facility] at her request. Pt knows to check her BP every 2-3 days for a couple of weeks to ensure no significant changes in BP.

## 2017-07-12 MED ORDER — IRBESARTAN-HYDROCHLOROTHIAZIDE 300-12.5 MG PO TABS: 1.0000 | ORAL_TABLET | Freq: Every day | ORAL | 3 refills | Status: DC

## 2017-07-12 NOTE — Telephone Encounter (Signed)
I see phone note from 07/11/17 Gregery Na RN about Avapro/HCTZ 300/12.5 mg daily. S/w Ronalee Belts Pharm-D at [facility] today who said the Pharm-D that was on yesterday did not realize Avapro/HCTZ came in the dose of 300/12.5 mg. Rx will be filled today as the combo Avapro/HCTZ 300/12.5 mg 1 tablet daily. Pharm-D thanked me for my help today.

## 2017-07-12 NOTE — Addendum Note (Signed)
Addended by: Michae Kava on: 07/12/2017 10:50 AM   Modules accepted: Orders

## 2017-07-22 ENCOUNTER — Other Ambulatory Visit: Payer: PPO

## 2017-07-22 DIAGNOSIS — I10 Essential (primary) hypertension: Secondary | ICD-10-CM | POA: Diagnosis not present

## 2017-07-22 LAB — BASIC METABOLIC PANEL
BUN / CREAT RATIO: 25 (ref 12–28)
BUN: 21 mg/dL (ref 8–27)
CHLORIDE: 102 mmol/L (ref 96–106)
CO2: 23 mmol/L (ref 20–29)
Calcium: 9.9 mg/dL (ref 8.7–10.3)
Creatinine, Ser: 0.85 mg/dL (ref 0.57–1.00)
GFR calc non Af Amer: 66 mL/min/{1.73_m2} (ref >59)
GFR, EST AFRICAN AMERICAN: 76 mL/min/{1.73_m2} (ref >59)
GLUCOSE: 87 mg/dL (ref 65–99)
POTASSIUM: 4 mmol/L (ref 3.5–5.2)
Sodium: 141 mmol/L (ref 134–144)

## 2017-07-25 ENCOUNTER — Encounter: Payer: Self-pay | Admitting: Physician Assistant

## 2017-07-26 ENCOUNTER — Other Ambulatory Visit: Payer: Self-pay | Admitting: Cardiology

## 2017-08-02 DIAGNOSIS — Z961 Presence of intraocular lens: Secondary | ICD-10-CM | POA: Diagnosis not present

## 2017-08-02 DIAGNOSIS — H353131 Nonexudative age-related macular degeneration, bilateral, early dry stage: Secondary | ICD-10-CM | POA: Diagnosis not present

## 2017-09-19 DIAGNOSIS — I10 Essential (primary) hypertension: Secondary | ICD-10-CM | POA: Diagnosis not present

## 2017-09-19 DIAGNOSIS — M859 Disorder of bone density and structure, unspecified: Secondary | ICD-10-CM | POA: Diagnosis not present

## 2017-09-26 DIAGNOSIS — Z9861 Coronary angioplasty status: Secondary | ICD-10-CM | POA: Diagnosis not present

## 2017-09-26 DIAGNOSIS — Z0000 Encounter for general adult medical examination without abnormal findings: Secondary | ICD-10-CM | POA: Diagnosis not present

## 2017-09-26 DIAGNOSIS — I10 Essential (primary) hypertension: Secondary | ICD-10-CM | POA: Diagnosis not present

## 2017-09-26 DIAGNOSIS — J302 Other seasonal allergic rhinitis: Secondary | ICD-10-CM | POA: Diagnosis not present

## 2017-09-26 DIAGNOSIS — Z1389 Encounter for screening for other disorder: Secondary | ICD-10-CM | POA: Diagnosis not present

## 2017-09-26 DIAGNOSIS — H8113 Benign paroxysmal vertigo, bilateral: Secondary | ICD-10-CM | POA: Diagnosis not present

## 2017-09-26 DIAGNOSIS — E7849 Other hyperlipidemia: Secondary | ICD-10-CM | POA: Diagnosis not present

## 2017-09-26 DIAGNOSIS — M859 Disorder of bone density and structure, unspecified: Secondary | ICD-10-CM | POA: Diagnosis not present

## 2017-09-26 DIAGNOSIS — F418 Other specified anxiety disorders: Secondary | ICD-10-CM | POA: Diagnosis not present

## 2017-09-26 DIAGNOSIS — Z6825 Body mass index (BMI) 25.0-25.9, adult: Secondary | ICD-10-CM | POA: Diagnosis not present

## 2017-10-03 DIAGNOSIS — Z1212 Encounter for screening for malignant neoplasm of rectum: Secondary | ICD-10-CM | POA: Diagnosis not present

## 2018-01-05 ENCOUNTER — Other Ambulatory Visit: Payer: Self-pay | Admitting: Physician Assistant

## 2018-02-08 ENCOUNTER — Telehealth: Payer: Self-pay | Admitting: Internal Medicine

## 2018-02-08 NOTE — Telephone Encounter (Signed)
New Message   Cell [phone]   STAT if HR is under 50 or over 120 (normal HR is 60-100 beats per minute)  1) What is your heart rate?  90 right now   2) Do you have a log of your heart rate readings (document readings)?  68 this morning   3) Do you have any other symptoms? Patient states it feels like her heart is racing and she is not feeling well, she wants to be seen today at the office

## 2018-02-08 NOTE — Telephone Encounter (Signed)
Patient requesting OV because her heart is racing and she feels pulsating jaw discomfort..    Most of the evening 3/12, she experienced these symptoms..    She also experiences upper chest discomfort when breathing in cold air through her mouth..  She is not presently experiencing CP or SOB..  I scheduled an appt 3/14 @ 3:40.Erika Brown

## 2018-02-09 ENCOUNTER — Encounter: Payer: Self-pay | Admitting: Internal Medicine

## 2018-02-09 ENCOUNTER — Ambulatory Visit: Payer: PPO | Admitting: Internal Medicine

## 2018-02-09 VITALS — BP 126/74 | HR 76 | Ht 64.0 in | Wt 147.0 lb

## 2018-02-09 DIAGNOSIS — E785 Hyperlipidemia, unspecified: Secondary | ICD-10-CM | POA: Diagnosis not present

## 2018-02-09 DIAGNOSIS — R002 Palpitations: Secondary | ICD-10-CM | POA: Diagnosis not present

## 2018-02-09 DIAGNOSIS — I493 Ventricular premature depolarization: Secondary | ICD-10-CM | POA: Diagnosis not present

## 2018-02-09 DIAGNOSIS — I10 Essential (primary) hypertension: Secondary | ICD-10-CM | POA: Diagnosis not present

## 2018-02-09 DIAGNOSIS — I2510 Atherosclerotic heart disease of native coronary artery without angina pectoris: Secondary | ICD-10-CM | POA: Diagnosis not present

## 2018-02-09 NOTE — Patient Instructions (Addendum)
Medication Instructions:  Your physician recommends that you continue on your current medications as directed. Please refer to the Current Medication list given to you today.  -- If you need a refill on your cardiac medications before your next appointment, please call your pharmacy. --  Labwork: None ordered  Testing/Procedures:Please schedule to be done in 1 week.. Thank you   Your physician has requested that you have en exercise stress myoview. For further information please visit [email or web address]. Please follow instruction sheet, as given.   Follow-Up: Your physician wants you to follow-up in: 4-6 weeks with Kathlen Mody, S.    Thank you for choosing [facility]!!    Any Other Special Instructions Will Be Listed Below (If Applicable).

## 2018-02-09 NOTE — Progress Notes (Signed)
Follow-up Outpatient Visit Date: 02/09/2018  Primary Care Provider: Burnard Bunting, [address] [place] [address]  Chief Complaint: Palpitations and labile blood pressure  HPI:  Erika Brown is a 77 y.o. year-old female with history of CAD s/p remote PCIs to the LAD, RCA, and diagonal (2006), HTN, HLD, and palpitations, who presents for follow-up of CAD. She was previously followed in our office by Dr. Aundra Dubin and was last seen by Erika Dopp, PA, in 06/2017. At that time, she was feeling well with the exception of rare palpitations. She declined referral to the lipid clinic to discuss PCSK9 inhibitor therapy in the setting of statin intolerance. She contacted our office yesterday due to palpitations and jaw discomfort.  Today, Erika Brown presents due to palpitations. She has noticed her heart beating irregularly. This is most pronounced at night and kept her from falling asleep earlier this week. Her pulse has not felt particularly fast, just somewhat irregular. She has also found her blood pressure to be low at times (down to 90's/50's) with accompanying fatigue. At other times, her blood pressure has been elevated. She denies chest pain, shortness of breath, lightheadedness, orthopnea, and PND. Erika Brown notes occasional right-sided jaw discomfort, especially at night. She wonders if she is grinding or clenching her teeth. This is notable, however because she experienced jaw discomfort leading up to her MI in 2006 (albeit on the left side).  Erika Brown brings tracing from [place], though she has not recorded anything since the fall. Tracings show sinus rhythm with PACs, PVCs, and artifact.  --------------------------------------------------------------------------------------------------  Past Medical History:  Diagnosis Date  . Anxiety and depression   . CAD (coronary artery disease)    S/P NSTEMI 4/06 EF 60%; Treated with Cypher DES to LAD and RCA with kissing balloon PCI  diagonal; Treadmill myoview 10/09 for CP: Walked 7:06. mild ST-T-wave changes in recovery. EF 71%. Very mild reversible defect in base of the inferior wall --> medical rx.  . Depression   . Glucose intolerance (impaired glucose tolerance)   . Heart murmur   . History of echocardiogram    Echo 11/17: Vigorous LVF, EF 65-70, normal wall motion, borderline diastolic function, systolic bowing of mitral valve without prolapse, mild MR, trivial TR, PASP 20  . History of medication noncompliance   . Hyperlipidemia    Intolerate to multiple statins due to severe myalgias  . Hypertension    Possible white-coat component  . MI (myocardial infarction) ([place])    acute-s/p stent  . Microhematuria   . Psoriasis   . Rapid heart beat   . Vitamin D deficiency    Past Surgical History:  Procedure Laterality Date  . ABDOMINAL HYSTERECTOMY     and anterior repair  . APPENDECTOMY    . bladder tact    . BREAST BIOPSY    . CATARACT EXTRACTION Bilateral 2017   Dr. Katy Fitch  . CORONARY STENT PLACEMENT    . MOHS SURGERY  9/14   BCC.  [facility]  . TONSILLECTOMY AND ADENOIDECTOMY      Current Meds  Medication Sig  . amLODipine (NORVASC) 5 MG tablet Take 1 tablet (5 mg total) by mouth daily.  [name] aspirin 81 MG EC tablet Take 81 mg by mouth daily.    . clopidogrel (PLAVIX) 75 MG tablet Take 1 tablet (75 mg total) by mouth daily.  . ergocalciferol (VITAMIN D2) 50000 UNITS capsule Take 50,000 Units by mouth once a week. On Friday  . irbesartan-hydrochlorothiazide (AVALIDE) 300-12.5  MG tablet TAKE 1 TABLET ONCE DAILY.  . metoprolol succinate (TOPROL XL) 25 MG 24 hr tablet Take 1 tablet (25 mg total) by mouth daily.  . nitroGLYCERIN (NITROSTAT) 0.4 MG SL tablet DISSOLVE 1 TABLET UNDER TONGUE AS NEEDED FOR CHEST PAIN,MAY REPEAT IN5 MINUTES UP TO 3 DOSES.  [name] potassium chloride SA (K-DUR,KLOR-CON) 20 MEQ tablet TAKE 1 TABLET ONCE DAILY.    Allergies: Iodine; Latex; Other; and Statins  Social History    Socioeconomic History  . Marital status: Married    Spouse name: Not on file  . Number of children: 7  . Years of education: Not on file  . Highest education level: Not on file  Social Needs  . Financial resource strain: Not on file  . Food insecurity - worry: Not on file  . Food insecurity - inability: Not on file  . Transportation needs - medical: Not on file  . Transportation needs - non-medical: Not on file  Occupational History  . Occupation: Works at home, Charity fundraiser, [employer] Use  . Smoking status: Former Research scientist (life sciences)  . Smokeless tobacco: Never Used  Substance and Sexual Activity  . Alcohol use: Yes    Alcohol/week: 2.4 oz    Types: 4 Glasses of wine per week    Comment: glass of wine at dinner most nights  . Drug use: No  . Sexual activity: Not Currently    Birth control/protection: Abstinence, Surgical    Comment: TVH  Other Topics Concern  . Not on file  Social History Narrative   Lives in [place] with her husband   Seven kids   Plays tennis   Very good card player    Family History  Problem Relation Age of Onset  . Heart disease Father   . Heart attack Father   . Ulcers Father   . Hypertension Mother   . Melanoma Mother   . Cancer Daughter        melanoma  . Stroke Maternal Grandmother   . Stroke Maternal Grandfather   . Renal Disease Maternal Grandfather        kidney removed  . Diabetes Paternal Grandmother   . Melanoma Child        Metastatic  . Breast cancer Sister 29  . Hypertension Brother   . Asthma Brother   . Depression Daughter        x2  . Rectal cancer Son   . Coronary artery disease Neg Hx     Review of Systems: A 12-system review of systems was performed and was negative except as noted in the HPI.  --------------------------------------------------------------------------------------------------  Physical Exam: BP 126/74   Pulse 76   Ht 5\' 4"  (1.626 m)   Wt 147 lb (66.7 kg)   LMP 11/29/1974   BMI 25.23  kg/m   General:  No acute distress. HEENT: No conjunctival pallor or scleral icterus. Moist mucous membranes.  OP clear. Neck: Supple without lymphadenopathy, thyromegaly, JVD, or HJR. Lungs: Normal work of breathing. Clear to auscultation bilaterally without wheezes or crackles. Heart: RRR with occasional extrasystoles. No murmurs or rubs Non-displaced PMI. Abd: Bowel sounds present. Soft, NT/ND without hepatosplenomegaly Ext: No lower extremity edema. Radial, PT, and DP pulses are 2+ bilaterally. Skin: Warm and dry without rash.  EKG:  NSR with frequent PVCs.  Lab Results  Component Value Date   WBC 10.7 (H) 08/31/2016   HGB 13.3 08/31/2016   HCT 40.9 08/31/2016   MCV 83.3 08/31/2016   PLT 208 08/31/2016  Lab Results  Component Value Date   NA 141 07/22/2017   K 4.0 07/22/2017   CL 102 07/22/2017   CO2 23 07/22/2017   BUN 21 07/22/2017   CREATININE 0.85 07/22/2017   GLUCOSE 87 07/22/2017   ALT 23 08/31/2016    Lab Results  Component Value Date   CHOL 235 (H) 03/02/2016   HDL 79 03/02/2016   LDLCALC 142 (H) 03/02/2016   LDLDIRECT 121.7 09/08/2007   TRIG 72 03/02/2016   CHOLHDL 3.0 03/02/2016    --------------------------------------------------------------------------------------------------  ASSESSMENT AND PLAN: Palpitations and PVCs Erika Brown's description of her palpitations is consistent with PVCs seen on EKG today. She has also had PACs in the past on the [employer] monitor. There are no worrisome associated symptoms. However, given her history of multivessel CAD with BMS placement >10 years ago,worsening CAD could be contributing to her ventricular ectopy. I have recommended an exercise myocardial perfusion stress test. We will also check a BMP, magnesium level, and TSH. We will continue with metoprolol succinate 25 mg daily.  Coronary artery disease without angina No chest pain or shortness of breath. Non-exertional jaw pain would be an unusual anginal  equivalent, though in the setting of frequent PVCs, we will proceed with ischemia evaluation, as above.  Hyperlipidemia Most recent LDL in 08/2017 was significantly above goal at 144. Erika Brown is intolerant of statins and has previously declined referral to lipid clinic to discuss other therapies (i.e. PCSK9 inhibitors). We will readdress this at follow-up.  Hypertension BP normal today. Home readings have been labile, though I question their accuracy given that Erika Brown is using a wrist cuff. We will defer medication changes today.  Follow-up: Return to clinic in 4-6 weeks.  Nelva Bush, MD 02/10/2018 2:47 PM

## 2018-02-09 NOTE — Progress Notes (Signed)
Myoview Exercise

## 2018-02-10 ENCOUNTER — Encounter: Payer: Self-pay | Admitting: Internal Medicine

## 2018-02-10 DIAGNOSIS — I493 Ventricular premature depolarization: Secondary | ICD-10-CM | POA: Insufficient documentation

## 2018-02-10 DIAGNOSIS — R002 Palpitations: Secondary | ICD-10-CM | POA: Insufficient documentation

## 2018-02-13 ENCOUNTER — Other Ambulatory Visit: Payer: Self-pay

## 2018-02-13 DIAGNOSIS — R002 Palpitations: Secondary | ICD-10-CM

## 2018-02-13 DIAGNOSIS — I493 Ventricular premature depolarization: Secondary | ICD-10-CM

## 2018-02-15 ENCOUNTER — Telehealth: Payer: Self-pay

## 2018-02-15 NOTE — Telephone Encounter (Signed)
Lpmtcb regarding appt with Kathleen Argue PA

## 2018-02-16 ENCOUNTER — Telehealth (HOSPITAL_COMMUNITY): Payer: Self-pay | Admitting: *Deleted

## 2018-02-16 ENCOUNTER — Encounter (HOSPITAL_COMMUNITY): Payer: Self-pay | Admitting: *Deleted

## 2018-02-16 DIAGNOSIS — I2510 Atherosclerotic heart disease of native coronary artery without angina pectoris: Secondary | ICD-10-CM | POA: Insufficient documentation

## 2018-02-16 NOTE — Telephone Encounter (Signed)
Patient given detailed instructions per Myocardial Perfusion Study Information Sheet for the test on  02/21/18. Patient notified to arrive 15 minutes early and that it is imperative to arrive on time for appointment to keep from having the test rescheduled.  If you need to cancel or reschedule your appointment, please call the office within 24 hours of your appointment. . Patient verbalized understanding. Erika Brown Jacqueline    

## 2018-02-21 ENCOUNTER — Other Ambulatory Visit: Payer: PPO | Admitting: *Deleted

## 2018-02-21 ENCOUNTER — Other Ambulatory Visit: Payer: Self-pay

## 2018-02-21 ENCOUNTER — Ambulatory Visit (HOSPITAL_BASED_OUTPATIENT_CLINIC_OR_DEPARTMENT_OTHER): Payer: PPO

## 2018-02-21 ENCOUNTER — Emergency Department (HOSPITAL_BASED_OUTPATIENT_CLINIC_OR_DEPARTMENT_OTHER)
Admission: EM | Admit: 2018-02-21 | Discharge: 2018-02-21 | Disposition: A | Discharge status: Home / Self Care | Payer: PPO | Source: Home / Self Care | Attending: Emergency Medicine | Admitting: Emergency Medicine

## 2018-02-21 ENCOUNTER — Encounter (HOSPITAL_BASED_OUTPATIENT_CLINIC_OR_DEPARTMENT_OTHER): Payer: Self-pay | Admitting: Emergency Medicine

## 2018-02-21 ENCOUNTER — Telehealth: Payer: Self-pay | Admitting: Physician Assistant

## 2018-02-21 ENCOUNTER — Emergency Department (HOSPITAL_COMMUNITY)
Admission: EM | Admit: 2018-02-21 | Discharge: 2018-02-21 | Disposition: A | Discharge status: Home / Self Care | Payer: PPO | Attending: Emergency Medicine | Admitting: Emergency Medicine

## 2018-02-21 DIAGNOSIS — Z7982 Long term (current) use of aspirin: Secondary | ICD-10-CM

## 2018-02-21 DIAGNOSIS — I493 Ventricular premature depolarization: Secondary | ICD-10-CM

## 2018-02-21 DIAGNOSIS — Z955 Presence of coronary angioplasty implant and graft: Secondary | ICD-10-CM

## 2018-02-21 DIAGNOSIS — Z91040 Latex allergy status: Secondary | ICD-10-CM

## 2018-02-21 DIAGNOSIS — E871 Hypo-osmolality and hyponatremia: Secondary | ICD-10-CM

## 2018-02-21 DIAGNOSIS — Z87891 Personal history of nicotine dependence: Secondary | ICD-10-CM | POA: Insufficient documentation

## 2018-02-21 DIAGNOSIS — I10 Essential (primary) hypertension: Secondary | ICD-10-CM

## 2018-02-21 DIAGNOSIS — I2510 Atherosclerotic heart disease of native coronary artery without angina pectoris: Secondary | ICD-10-CM

## 2018-02-21 DIAGNOSIS — Z79899 Other long term (current) drug therapy: Secondary | ICD-10-CM | POA: Insufficient documentation

## 2018-02-21 DIAGNOSIS — R002 Palpitations: Secondary | ICD-10-CM | POA: Diagnosis not present

## 2018-02-21 DIAGNOSIS — I252 Old myocardial infarction: Secondary | ICD-10-CM | POA: Insufficient documentation

## 2018-02-21 DIAGNOSIS — Z7901 Long term (current) use of anticoagulants: Secondary | ICD-10-CM | POA: Diagnosis not present

## 2018-02-21 LAB — MYOCARDIAL PERFUSION IMAGING
CHL CUP MPHR: 142 {beats}/min
CSEPHR: 94 %
Estimated workload: 7 METS
Exercise duration (min): 4 min
Exercise duration (sec): 15 s
LVDIAVOL: 49 mL (ref 46–106)
LVSYSVOL: 17 mL
Peak HR: 134 {beats}/min
RATE: 0.31
RPE: 18
Rest HR: 85 {beats}/min
SDS: 17
SRS: 1
SSS: 18
TID: 0.85

## 2018-02-21 LAB — BASIC METABOLIC PANEL
ANION GAP: 11 (ref 5–15)
BUN / CREAT RATIO: 19 (ref 12–28)
BUN: 17 mg/dL (ref 8–27)
BUN: 17 mg/dL (ref 6–20)
CALCIUM: 9.9 mg/dL (ref 8.9–10.3)
CO2: 21 mmol/L (ref 20–29)
CO2: 24 mmol/L (ref 22–32)
CREATININE: 0.89 mg/dL (ref 0.57–1.00)
Calcium: 9.7 mg/dL (ref 8.7–10.3)
Chloride: 101 mmol/L (ref 96–106)
Chloride: 102 mmol/L (ref 101–111)
Creatinine, Ser: 0.71 mg/dL (ref 0.44–1.00)
GFR calc Af Amer: 72 mL/min/{1.73_m2} (ref >59)
GFR calc Af Amer: >60 mL/min (ref >60)
GFR calc non Af Amer: 62 mL/min/{1.73_m2} (ref >59)
GFR calc non Af Amer: >60 mL/min (ref >60)
GLUCOSE: 83 mg/dL (ref 65–99)
GLUCOSE: 111 mg/dL — AB (ref 65–99)
POTASSIUM: 7.7 mmol/L — AB (ref 3.5–5.2)
Potassium: 3.2 mmol/L — ABNORMAL LOW (ref 3.5–5.1)
SODIUM: 135 mmol/L (ref 134–144)
Sodium: 137 mmol/L (ref 135–145)

## 2018-02-21 LAB — MAGNESIUM: MAGNESIUM: 2 mg/dL (ref 1.6–2.3)

## 2018-02-21 LAB — TSH: TSH: 1.27 u[IU]/mL (ref 0.450–4.500)

## 2018-02-21 MED ORDER — TECHNETIUM TC 99M TETROFOSMIN IV KIT
10.8000 | PACK | Freq: Once | INTRAVENOUS | Status: AC | PRN
  Administered 2018-02-21: 10.8 via INTRAVENOUS
  Filled 2018-02-21: qty 11

## 2018-02-21 MED ORDER — TECHNETIUM TC 99M TETROFOSMIN IV KIT
32.4000 | PACK | Freq: Once | INTRAVENOUS | Status: AC | PRN
  Administered 2018-02-21: 32.4 via INTRAVENOUS
  Filled 2018-02-21: qty 33

## 2018-02-21 NOTE — Discharge Instructions (Addendum)
Your potassium tonight was 3.2.  This is slightly low.  This can increase your frequency of PVCs. Increase your potassium to twice per day for the next 5 days.

## 2018-02-21 NOTE — ED Provider Notes (Signed)
[facility] EMERGENCY DEPARTMENT Provider Note   CSN: [id number] Arrival date & time: 02/21/18  2028     History   Chief Complaint Chief Complaint  Patient presents with  . Abnormal Lab    HPI Erika Brown is a 77 y.o. female.  If complaint is high potassium, sent by physician  HPI this is a very pleasant 77 year old female.  She has been experiencing some palpitations.  EKGs are showing PVCs.  She had a stress test today that she does not know the results of.  She had some lab test performed.  She received a call from her primary care physician that her potassium was 7.7 and she was referred here.  Her only complaint is occasional feeling of irregular heartbeat.  Does not have pain or other symptoms.  Past Medical History:  Diagnosis Date  . Anxiety and depression   . CAD (coronary artery disease)    S/P NSTEMI 4/06 EF 60%; Treated with Cypher DES to LAD and RCA with kissing balloon PCI diagonal; Treadmill myoview 10/09 for CP: Walked 7:06. mild ST-T-wave changes in recovery. EF 71%. Very mild reversible defect in base of the inferior wall --> medical rx.  . Depression   . Glucose intolerance (impaired glucose tolerance)   . Heart murmur   . History of echocardiogram    Echo 11/17: Vigorous LVF, EF 65-70, normal wall motion, borderline diastolic function, systolic bowing of mitral valve without prolapse, mild MR, trivial TR, PASP 20  . History of medication noncompliance   . Hyperlipidemia    Intolerate to multiple statins due to severe myalgias  . Hypertension    Possible white-coat component  . MI (myocardial infarction) ([place])    acute-s/p stent  . Microhematuria   . Psoriasis   . Rapid heart beat   . Vitamin D deficiency     Patient Active Problem List   Diagnosis Date Noted  . CAD (coronary artery disease)   . PVC's (premature ventricular contractions) 02/10/2018  . Palpitations 02/10/2018  . Pure hypercholesterolemia 01/07/2017  . SHOULDER PAIN,  LEFT 02/11/2009  . Hyperlipidemia LDL goal <70 02/09/2009  . Essential hypertension 02/09/2009  . Coronary artery disease involving native coronary artery of native heart without angina pectoris 02/09/2009    Past Surgical History:  Procedure Laterality Date  . ABDOMINAL HYSTERECTOMY     and anterior repair  . APPENDECTOMY    . bladder tact    . BREAST BIOPSY    . CATARACT EXTRACTION Bilateral 2017   Dr. Katy Fitch  . CORONARY STENT PLACEMENT    . MOHS SURGERY  9/14   BCC.  [facility]  . TONSILLECTOMY AND ADENOIDECTOMY       OB History    Gravida  7   Para  7   Term      Preterm      AB      Living  6     SAB      TAB      Ectopic      Multiple      Live Births           Obstetric Comments  One daughter died of melanoma         Home Medications    Prior to Admission medications   Medication Sig Start Date End Date Taking? Authorizing Provider  amLODipine (NORVASC) 5 MG tablet Take 1 tablet (5 mg total) by mouth daily. 05/13/17   Liliane Shi, PA-C  aspirin 81 MG EC tablet Take 81 mg by mouth daily.      [provider]  clopidogrel (PLAVIX) 75 MG tablet Take 1 tablet (75 mg total) by mouth daily. 05/13/17   Richardson Dopp T, PA-C  ergocalciferol (VITAMIN D2) 50000 UNITS capsule Take 50,000 Units by mouth once a week. On Friday    [provider]  irbesartan-hydrochlorothiazide (AVALIDE) 300-12.5 MG tablet TAKE 1 TABLET ONCE DAILY. 01/05/18   Richardson Dopp T, PA-C  meclizine (ANTIVERT) 12.5 MG tablet TAKE 1/2 TABLET BY MOUTH 3 (THREE )N TIMES DAILY AS NEEDED FOR DIZZINESS    [provider]  metoprolol succinate (TOPROL XL) 25 MG 24 hr tablet Take 1 tablet (25 mg total) by mouth daily. 01/07/17   Richardson Dopp T, PA-C  nitroGLYCERIN (NITROSTAT) 0.4 MG SL tablet DISSOLVE 1 TABLET UNDER TONGUE AS NEEDED FOR CHEST PAIN,MAY REPEAT IN5 MINUTES UP TO 3 DOSES. 05/12/16   Isaiah Serge, NP  potassium chloride SA (K-DUR,KLOR-CON) 20 MEQ tablet  TAKE 1 TABLET ONCE DAILY. 07/26/17   Liliane Shi, PA-C    Family History Family History  Problem Relation Age of Onset  . Heart disease Father   . Heart attack Father   . Ulcers Father   . Hypertension Mother   . Melanoma Mother   . Cancer Daughter        melanoma  . Stroke Maternal Grandmother   . Stroke Maternal Grandfather   . Renal Disease Maternal Grandfather        kidney removed  . Diabetes Paternal Grandmother   . Melanoma Child        Metastatic  . Breast cancer Sister 58  . Hypertension Brother   . Asthma Brother   . Depression Daughter        x2  . Rectal cancer Son   . Coronary artery disease Neg Hx     Social History Social History   Tobacco Use  . Smoking status: Former Research scientist (life sciences)  . Smokeless tobacco: Never Used  Substance Use Topics  . Alcohol use: Yes    Alcohol/week: 2.4 oz    Types: 4 Glasses of wine per week    Comment: glass of wine at dinner most nights  . Drug use: No     Allergies   Iodine; Latex; Other; and Statins   Review of Systems Review of Systems  Constitutional: Negative for appetite change, chills, diaphoresis, fatigue and fever.  HENT: Negative for mouth sores, sore throat and trouble swallowing.   Eyes: Negative for visual disturbance.  Respiratory: Negative for cough, chest tightness, shortness of breath and wheezing.   Cardiovascular: Positive for palpitations. Negative for chest pain.  Gastrointestinal: Negative for abdominal distention, abdominal pain, diarrhea, nausea and vomiting.  Endocrine: Negative for polydipsia, polyphagia and polyuria.  Genitourinary: Negative for dysuria, frequency and hematuria.  Musculoskeletal: Negative for gait problem.  Skin: Negative for color change, pallor and rash.  Neurological: Negative for dizziness, syncope, light-headedness and headaches.  Hematological: Does not bruise/bleed easily.  Psychiatric/Behavioral: Negative for behavioral problems and confusion.     Physical  Exam Updated Vital Signs BP (!) 181/89 (BP Location: Right Arm)   Pulse 81   Temp 98.9 F (37.2 C) (Oral)   Resp 18   Ht 5\' 4"  (1.626 m)   Wt 66.7 kg (147 lb)   LMP 11/29/1974   SpO2 100%   BMI 25.23 kg/m   Physical Exam  Constitutional: She is oriented to person, place, and time.  She appears well-developed and well-nourished. No distress.  HENT:  Head: Normocephalic.  Eyes: Pupils are equal, round, and reactive to light. Conjunctivae are normal. No scleral icterus.  Neck: Normal range of motion. Neck supple. No thyromegaly present.  Cardiovascular: Normal rate. Exam reveals no gallop and no friction rub.  No murmur heard. Occasional irregular beat consistent with her history of PVCs  Pulmonary/Chest: Effort normal and breath sounds normal. No respiratory distress. She has no wheezes. She has no rales.  Abdominal: Soft. Bowel sounds are normal. She exhibits no distension. There is no tenderness. There is no rebound.  Musculoskeletal: Normal range of motion.  Neurological: She is alert and oriented to person, place, and time.  Skin: Skin is warm and dry. No rash noted.  Psychiatric: She has a normal mood and affect. Her behavior is normal.     ED Treatments / Results  Labs (all labs ordered are listed, but only abnormal results are displayed) Labs Reviewed  BASIC METABOLIC PANEL - Abnormal; Notable for the following components:      Result Value   Potassium 3.2 (*)    Glucose, Bld 111 (*)    All other components within normal limits    EKG None  Radiology No results found.  Procedures Procedures (including critical care time)  Medications Ordered in ED Medications - No data to display   Initial Impression / Assessment and Plan / ED Course  I have reviewed the triage vital signs and the nursing notes.  Pertinent labs & imaging results that were available during my care of the patient were reviewed by me and considered in my medical decision making (see chart  for details).    Lab test today did show hemolysis.  Repeat is 3.2 of her potassium.  She will double her potassium for the next 5 days and follow-up with her physician regarding the results of her exercise treadmill.    EKG Interpretation  Date/Time:  Tuesday February 21 2018 20:47:38 EDT Ventricular Rate:  88 PR Interval:  186 QRS Duration: 76 QT Interval:  382 QTC Calculation: 462 R Axis:   65 Text Interpretation:  Sinus rhythm with sinus arrhythmia with frequent Premature ventricular complexes Nonspecific ST abnormality Abnormal ECG Confirmed by Tanna Furry [phone]) on 02/21/2018 9:46:38 PM   Final Clinical Impressions(s) / ED Diagnoses   Final diagnoses:  Hyponatremia  PVC (premature ventricular contraction)    ED Discharge Orders    None       Tanna Furry, MD 02/21/18 2146

## 2018-02-21 NOTE — Telephone Encounter (Signed)
[facility] called after hours answering service with potassium of 7.7. Specimen hemolyzed. Last K was 4.0 in 06/2017. Spoke with patient - recently seen for palpitations and PVCs on EKG. Feeling overall OK today, just took a potassium supplement. Reviewed with Dr. Acie Fredrickson (DOD). Although we feel the elevated K is likely due to hemolysis (given Cr normal), with recent symptoms and potassium supplementation, recommend she proceed to ER to have this recheck. Pt advised not to drive herself. She verbalized understanding/gratitude. Had many questions about elevated potassium and possible relationship to her PVCs, although I told her we are actually not quite sure if this is a true value yet or not. Will cc to Dr. Saunders Revel for [place] as well. Dayna Dunn PA-C

## 2018-02-21 NOTE — ED Triage Notes (Signed)
Patient states that she had a stress test this am and she was called and told to come in and have her blood rechecked because her Potassium was high

## 2018-02-22 ENCOUNTER — Telehealth: Payer: Self-pay | Admitting: Internal Medicine

## 2018-02-22 NOTE — Telephone Encounter (Signed)
Thank you for the update. It appears that repeat potassium in the ED was actually slightly low.  Nelva Bush, MD [facility] Pager: [phone]

## 2018-02-22 NOTE — Telephone Encounter (Signed)
I think it is fine for her to see Vin tomorrow. Thanks.  Gerald Stabs

## 2018-02-22 NOTE — H&P (View-Only) (Signed)
Cardiology Office Note    Date:  02/23/2018   ID:  Erika Brown, Erika Brown [birth year], MRN [id number]  PCP:  Burnard Bunting, MD  Cardiologist:  Dr. Saunders Revel  Chief Complaint: stress test follow up  History of Present Illness:   Erika Brown is a 77 y.o. female with history of CAD s/p remote PCIs to the LAD, RCA, and diagonal (2006), HTN, HLD, and palpitations presents for follow up.   She was previously followed in our office by Dr. Aundra Dubin. She declined referral to the lipid clinic to discuss PCSK9 inhibitor therapy in the setting of statin intolerance. Seen by Dr. Saunders Revel 02/09/18 for palpitations and stress test. Her palpitations is consistent with PVCs seen on EKG today. She has also had PACs in the past on the [employer] monitor. Treated for hypokalemia. Stress test was intermediate risk with medium size, moderate severity reversible defect in the basal and mid inferior and inferolateral walls consistent with ischemia.   Here today for follow up.  She continues to have mild intermittent chest tightness.  Denies orthopnea, PND, syncope, lower extremity edema, melena, palpitation or blood in his stool or urine.  She cannot recall which cholesterol medications she has tried previously.  She continues to decline referral to lipid clinic.   Past Medical History:  Diagnosis Date  . Anxiety and depression   . CAD (coronary artery disease)    S/P NSTEMI 4/06 EF 60%; Treated with Cypher DES to LAD and RCA with kissing balloon PCI diagonal; Treadmill myoview 10/09 for CP: Walked 7:06. mild ST-T-wave changes in recovery. EF 71%. Very mild reversible defect in base of the inferior wall --> medical rx.  . Depression   . Glucose intolerance (impaired glucose tolerance)   . Heart murmur   . History of echocardiogram    Echo 11/17: Vigorous LVF, EF 65-70, normal wall motion, borderline diastolic function, systolic bowing of mitral valve without prolapse, mild MR, trivial TR, PASP 20  . History of medication  noncompliance   . Hyperlipidemia    Intolerate to multiple statins due to severe myalgias  . Hypertension    Possible white-coat component  . MI (myocardial infarction) ([place])    acute-s/p stent  . Microhematuria   . Psoriasis   . Rapid heart beat   . Vitamin D deficiency     Past Surgical History:  Procedure Laterality Date  . ABDOMINAL HYSTERECTOMY     and anterior repair  . APPENDECTOMY    . bladder tact    . BREAST BIOPSY    . CATARACT EXTRACTION Bilateral 2017   Dr. Katy Fitch  . CORONARY STENT PLACEMENT    . MOHS SURGERY  9/14   BCC.  [facility]  . TONSILLECTOMY AND ADENOIDECTOMY      Current Medications: Prior to Admission medications   Medication Sig Start Date End Date Taking? Authorizing Provider  amLODipine (NORVASC) 5 MG tablet Take 1 tablet (5 mg total) by mouth daily. 05/13/17   Richardson Dopp T, PA-C  aspirin 81 MG EC tablet Take 81 mg by mouth daily.      [provider]  clopidogrel (PLAVIX) 75 MG tablet Take 1 tablet (75 mg total) by mouth daily. 05/13/17   Richardson Dopp T, PA-C  ergocalciferol (VITAMIN D2) 50000 UNITS capsule Take 50,000 Units by mouth once a week. On Friday    [provider]  irbesartan-hydrochlorothiazide (AVALIDE) 300-12.5 MG tablet TAKE 1 TABLET ONCE DAILY. 01/05/18   Richardson Dopp T, PA-C  meclizine Johnathan Hausen)  12.5 MG tablet TAKE 1/2 TABLET BY MOUTH 3 (THREE )N TIMES DAILY AS NEEDED FOR DIZZINESS    [provider]  metoprolol succinate (TOPROL XL) 25 MG 24 hr tablet Take 1 tablet (25 mg total) by mouth daily. 01/07/17   Richardson Dopp T, PA-C  nitroGLYCERIN (NITROSTAT) 0.4 MG SL tablet DISSOLVE 1 TABLET UNDER TONGUE AS NEEDED FOR CHEST PAIN,MAY REPEAT IN5 MINUTES UP TO 3 DOSES. 05/12/16   Isaiah Serge, NP  potassium chloride SA (K-DUR,KLOR-CON) 20 MEQ tablet TAKE 1 TABLET ONCE DAILY. 07/26/17   Richardson Dopp T, PA-C    Allergies:   Iodine; Latex; Other; and Statins   Social History   Socioeconomic History  .  Marital status: Married    Spouse name: Not on file  . Number of children: 7  . Years of education: Not on file  . Highest education level: Not on file  Occupational History  . Occupation: Works at home, Charity fundraiser, [employer]  . Financial resource strain: Not on file  . Food insecurity:    Worry: Not on file    Inability: Not on file  . Transportation needs:    Medical: Not on file    Non-medical: Not on file  Tobacco Use  . Smoking status: Former Research scientist (life sciences)  . Smokeless tobacco: Never Used  Substance and Sexual Activity  . Alcohol use: Yes    Alcohol/week: 2.4 oz    Types: 4 Glasses of wine per week    Comment: glass of wine at dinner most nights  . Drug use: No  . Sexual activity: Not Currently    Birth control/protection: Abstinence, Surgical    Comment: TVH  Lifestyle  . Physical activity:    Days per week: Not on file    Minutes per session: Not on file  . Stress: Not on file  Relationships  . Social connections:    Talks on phone: Not on file    Gets together: Not on file    Attends religious service: Not on file    Active member of club or organization: Not on file    Attends meetings of clubs or organizations: Not on file    Relationship status: Not on file  Other Topics Concern  . Not on file  Social History Narrative   Lives in [place] with her husband   Seven kids   Plays tennis   Very good card player     Family History:  The patient's family history includes Asthma in her brother; Breast cancer (age of onset: 39) in her sister; Cancer in her daughter; Depression in her daughter; Diabetes in her paternal grandmother; Heart attack in her father; Heart disease in her father; Hypertension in her brother and mother; Melanoma in her child and mother; Rectal cancer in her son; Renal Disease in her maternal grandfather; Stroke in her maternal grandfather and maternal grandmother; Ulcers in her father.   ROS:   Please see the history of  present illness.    ROS All other systems reviewed and are negative.   PHYSICAL EXAM:   VS:  BP (!) 146/78   Pulse 70   Wt 147 lb 12.8 oz (67 kg)   LMP 11/29/1974   SpO2 98%   BMI 25.37 kg/m    GEN: Well nourished, well developed, in no acute distress  HEENT: normal  Neck: no JVD, carotid bruits, or masses Cardiac: RRR; no murmurs, rubs, or gallops,no edema  Respiratory:  clear to auscultation bilaterally,  normal work of breathing GI: soft, nontender, nondistended, + BS MS: no deformity or atrophy  Skin: warm and dry, no rash Neuro:  Alert and Oriented x 3, Strength and sensation are intact Psych: euthymic mood, full affect  Wt Readings from Last 3 Encounters:  02/23/18 147 lb 12.8 oz (67 kg)  02/21/18 147 lb (66.7 kg)  02/21/18 147 lb (66.7 kg)      Studies/Labs Reviewed:   EKG:  EKG is not ordered today.    Recent Labs: 02/21/2018: BUN 17; Creatinine, Ser 0.71; Magnesium 2.0; Potassium 3.2; Sodium 137; TSH 1.270   Lipid Panel    Component Value Date/Time   CHOL 235 (H) 03/02/2016 0859   TRIG 72 03/02/2016 0859   HDL 79 03/02/2016 0859   CHOLHDL 3.0 03/02/2016 0859   VLDL 14 03/02/2016 0859   LDLCALC 142 (H) 03/02/2016 0859   LDLDIRECT 121.7 09/08/2007 0852    Additional studies/ records that were reviewed today include:   Stress test 02/21/18  Nuclear stress EF: 65%.  Blood pressure demonstrated a normal response to exercise.  Defect 1: There is a medium defect of moderate severity present in the basal inferior, basal inferolateral, mid inferior and mid inferolateral location.  This is an intermediate risk study.  The left ventricular ejection fraction is normal (55-65%).   The study is affected by motion, there is a medium size, moderate severity reversible defect in the basal and mid inferior and inferolateral walls consistent with ischemia. Artifact can't be excluded.   Echo 11/17: Vigorous LVF, EF 65-70, normal wall motion, borderline diastolic  function, systolic bowing of mitral valve without prolapse, mild MR, trivial TR, PASP 20  Holter 8/17 Predominantly sinus.  Rare PVCs and PACs.  No significant arrhythmias.  LHC 4/06 1. LEFT MAIN CORONARY ARTERY: The left main coronary artery was normal. 2. LEFT ANTERIOR DESCENDING CORONARY ARTERY: 50% proximal LAD, mid75%-80%, Dx 30%-40%; 1st septal 60% ostial with diff distal vessel disease 3. LEFT CIRCUMFLEX CORONARY ARTERY: 50% ostial lesionin the ramus 4. RIGHT CORONARY ARTERY: 40% ostial stenosis, followed by a 99% focal stenosis; 40% lesion prior to the takeoff of the [place].  LEFT VENTRICULOGRAM: EF 60% with no significant wall motion abnormalities or mitralregurgitation.  PCI 4/06 3.5 x 13 mm Cypher DES to RCA 3 x 28 mm Cypher DES to LAD POBA to Dx   ASSESSMENT & PLAN:     1. CAD s/p remote PCIs to the LAD - Recently seen for chest pain. Follow up stress test was intermediate risk with medium size, moderate severity reversible defect in the basal and mid inferior and inferolateral walls consistent with ischemia.  He continues to have intermittent chest tightness.  Patient and daughter (over phone) had multiple questions.  All answered.  Educated for about 30 minutes.  Patient has limited availability for cath as no family member is available to take care her.  They also preferred only few cardiologist.  After discussion patient finally agreed to proceed with cardiac cath next Tuesday with Dr. Irish Lack.  2. PVC - Does have palpitation. Could be exacerbated by recent hypokalemia vs possible ischemia,   3.  Hypokalemia -On supplementation.  4.  Hyperlipidemia -Last LDL in 2017 was 142 in epic.  She continues to decline lipid clinic referral and trial of another statin agent.  Discussed in detail regarding importance of proper cholesterol management with LDL goal of less than 70.  Medication Adjustments/Labs and Tests Ordered: Current medicines are  reviewed at length with the patient  today.  Concerns regarding medicines are outlined above.  Medication changes, Labs and Tests ordered today are listed in the Patient Instructions below. Patient Instructions  Your physician recommends that you continue on your current medications as directed. Please refer to the Current Medication list given to you today.   Your physician recommends that you return for lab work in:  [facility] OFF[facility] [address], [place] [address] [place] [address] Dept: [phone] Loc: [place]  02/23/2018  You are scheduled for a Cardiac Catheterization on Tuesday, April 2 with Dr. Larae Grooms.  1. Please arrive at the [facility] (Main Entrance A) at [facility]: [place], [place] [address] at 10:00 AM (two hours before your procedure to ensure your preparation). Free valet parking service is available.   Special note: Every effort is made to have your procedure done on time. Please understand that emergencies sometimes delay scheduled procedures.  2. Diet: Do not eat or drink anything after midnight prior to your procedure except sips of water to take medications.  3. Labs: You will need to have blood drawn on Thursday, March 28 at [facility] at [facility]. [address]  Open: 7:30am - 5pm    Phone: [phone]. You do not need to be fasting.  4. Medication instructions in preparation for your procedure:       On the morning of your procedure, take your Aspirin and all morning medicines.  You may use sips of water.  5. Plan for one night stay--bring personal belongings. 6. Bring a current list of your medications and current insurance cards. 7. You MUST have a responsible person to drive you home. 8. Someone MUST be with you the first 24 hours after you arrive home or  your discharge will be delayed. 9. Please wear clothes that are easy to get on and off and wear slip-on shoes.  Thank you for allowing [place] to care for you!   -- [facility] Invasive Cardiovascular services    Signed, Leanor Kail, [place]  02/23/2018 4:09 PM    [place], [place], [place]  [address] Phone: [phone]; Fax: [phone]

## 2018-02-22 NOTE — Telephone Encounter (Signed)
-----   Message from Nelva Bush, MD sent at 02/22/2018 12:40 PM EDT ----- Marked hyperkalemia noted. Patient was evaluated in the ED last night with repeat BMP showed mildly hypokalemia. Will have Ms. Suh follow up in the office in ~1 week with repeat labs and to discuss results of abnormal Myoview.

## 2018-02-22 NOTE — Telephone Encounter (Signed)
Spoke with patient and have her coming in tomorrow with Vin @ 3:30.. Is this ok or do you want her to come in to see you next Friday @ 10:00 (Quarter day)?  Thank you

## 2018-02-22 NOTE — Telephone Encounter (Signed)
Erika Brown is calling because she received a call from a PA on yesterday whom which told her to go to the ER because her potassium was showing up as 7.7. Per the ER they told her that her potassium level was below 3.2 and for her to take her potassium medication twice a day for 5 days . Is wanting to know if that is what she should do as prescribe by the MD at the ER and what is she to do after the 5 days . Please call   Thanks

## 2018-02-22 NOTE — Telephone Encounter (Signed)
Cont.. 3.2 and was advised to increase K to bid x 5 days.. Last night during the EKG she was told that she was having "regular PVCs and feels her heart."  She will begin taking that extra dose of K and advise [place] on how she is feeling..  Please advise, thank you.[name]

## 2018-02-22 NOTE — Progress Notes (Signed)
Cardiology Office Note    Date:  02/23/2018   ID:  Brunetta, [place] [birth year], MRN [id number]  PCP:  Burnard Bunting, MD  Cardiologist:  Dr. Saunders Revel  Chief Complaint: stress test follow up  History of Present Illness:   Erika Brown is a 77 y.o. female with history of CAD s/p remote PCIs to the LAD, RCA, and diagonal (2006), HTN, HLD, and palpitations presents for follow up.   She was previously followed in our office by Dr. Aundra Dubin. She declined referral to the lipid clinic to discuss PCSK9 inhibitor therapy in the setting of statin intolerance. Seen by Dr. Saunders Revel 02/09/18 for palpitations and stress test. Her palpitations is consistent with PVCs seen on EKG today. She has also had PACs in the past on the [employer] monitor. Treated for hypokalemia. Stress test was intermediate risk with medium size, moderate severity reversible defect in the basal and mid inferior and inferolateral walls consistent with ischemia.   Here today for follow up.  She continues to have mild intermittent chest tightness.  Denies orthopnea, PND, syncope, lower extremity edema, melena, palpitation or blood in his stool or urine.  She cannot recall which cholesterol medications she has tried previously.  She continues to decline referral to lipid clinic.   Past Medical History:  Diagnosis Date  . Anxiety and depression   . CAD (coronary artery disease)    S/P NSTEMI 4/06 EF 60%; Treated with Cypher DES to LAD and RCA with kissing balloon PCI diagonal; Treadmill myoview 10/09 for CP: Walked 7:06. mild ST-T-wave changes in recovery. EF 71%. Very mild reversible defect in base of the inferior wall --> medical rx.  . Depression   . Glucose intolerance (impaired glucose tolerance)   . Heart murmur   . History of echocardiogram    Echo 11/17: Vigorous LVF, EF 65-70, normal wall motion, borderline diastolic function, systolic bowing of mitral valve without prolapse, mild MR, trivial TR, PASP 20  . History of medication  noncompliance   . Hyperlipidemia    Intolerate to multiple statins due to severe myalgias  . Hypertension    Possible white-coat component  . MI (myocardial infarction) ([place])    acute-s/p stent  . Microhematuria   . Psoriasis   . Rapid heart beat   . Vitamin D deficiency     Past Surgical History:  Procedure Laterality Date  . ABDOMINAL HYSTERECTOMY     and anterior repair  . APPENDECTOMY    . bladder tact    . BREAST BIOPSY    . CATARACT EXTRACTION Bilateral 2017   Dr. Katy Fitch  . CORONARY STENT PLACEMENT    . MOHS SURGERY  9/14   BCC.  [facility]  . TONSILLECTOMY AND ADENOIDECTOMY      Current Medications: Prior to Admission medications   Medication Sig Start Date End Date Taking? Authorizing Provider  amLODipine (NORVASC) 5 MG tablet Take 1 tablet (5 mg total) by mouth daily. 05/13/17   Richardson Dopp T, PA-C  aspirin 81 MG EC tablet Take 81 mg by mouth daily.      [provider]  clopidogrel (PLAVIX) 75 MG tablet Take 1 tablet (75 mg total) by mouth daily. 05/13/17   Richardson Dopp T, PA-C  ergocalciferol (VITAMIN D2) 50000 UNITS capsule Take 50,000 Units by mouth once a week. On Friday    [provider]  irbesartan-hydrochlorothiazide (AVALIDE) 300-12.5 MG tablet TAKE 1 TABLET ONCE DAILY. 01/05/18   Richardson Dopp T, PA-C  meclizine Johnathan Hausen)  12.5 MG tablet TAKE 1/2 TABLET BY MOUTH 3 (THREE )N TIMES DAILY AS NEEDED FOR DIZZINESS    [provider]  metoprolol succinate (TOPROL XL) 25 MG 24 hr tablet Take 1 tablet (25 mg total) by mouth daily. 01/07/17   Richardson Dopp T, PA-C  nitroGLYCERIN (NITROSTAT) 0.4 MG SL tablet DISSOLVE 1 TABLET UNDER TONGUE AS NEEDED FOR CHEST PAIN,MAY REPEAT IN5 MINUTES UP TO 3 DOSES. 05/12/16   Isaiah Serge, NP  potassium chloride SA (K-DUR,KLOR-CON) 20 MEQ tablet TAKE 1 TABLET ONCE DAILY. 07/26/17   Richardson Dopp T, PA-C    Allergies:   Iodine; Latex; Other; and Statins   Social History   Socioeconomic History  .  Marital status: Married    Spouse name: Not on file  . Number of children: 7  . Years of education: Not on file  . Highest education level: Not on file  Occupational History  . Occupation: Works at home, Char[employer] fundraiser, [employer]  . Financial resource strain: Not on file  . Food insecurity:    Worry: Not on file    Inability: Not on file  . Transportation needs:    Medical: Not on file    Non-medical: Not on file  Tobacco Use  . Smoking status: Former Research scientist (life sciences)  . Smokeless tobacco: Never Used  Substance and Sexual Activity  . Alcohol use: Yes    Alcohol/week: 2.4 oz    Types: 4 Glasses of wine per week    Comment: glass of wine at dinner most nights  . Drug use: No  . Sexual activity: Not Currently    Birth control/protection: Abstinence, Surgical    Comment: TVH  Lifestyle  . Physical activity:    Days per week: Not on file    Minutes per session: Not on file  . Stress: Not on file  Relationships  . Social connections:    Talks on phone: Not on file    Gets together: Not on file    Attends religious service: Not on file    Active member of club or organization: Not on file    Attends meetings of clubs or organizations: Not on file    Relationship status: Not on file  Other Topics Concern  . Not on file  Social History Narrative   Lives in [place] with her husband   Seven kids   Plays tennis   Very good card player     Family History:  The patient's family history includes Asthma in her brother; Breast cancer (age of onset: 13) in her sister; Cancer in her daughter; Depression in her daughter; Diabetes in her paternal grandmother; Heart attack in her father; Heart disease in her father; Hypertension in her brother and mother; Melanoma in her child and mother; Rectal cancer in her son; Renal Disease in her maternal grandfather; Stroke in her maternal grandfather and maternal grandmother; Ulcers in her father.   ROS:   Please see the history of  present illness.    ROS All other systems reviewed and are negative.   PHYSICAL EXAM:   VS:  BP (!) 146/78   Pulse 70   Wt 147 lb 12.8 oz (67 kg)   LMP 11/29/1974   SpO2 98%   BMI 25.37 kg/m    GEN: Well nourished, well developed, in no acute distress  HEENT: normal  Neck: no JVD, carotid bruits, or masses Cardiac: RRR; no murmurs, rubs, or gallops,no edema  Respiratory:  clear to auscultation bilaterally,  normal work of breathing GI: soft, nontender, nondistended, + BS MS: no deformity or atrophy  Skin: warm and dry, no rash Neuro:  Alert and Oriented x 3, Strength and sensation are intact Psych: euthymic mood, full affect  Wt Readings from Last 3 Encounters:  02/23/18 147 lb 12.8 oz (67 kg)  02/21/18 147 lb (66.7 kg)  02/21/18 147 lb (66.7 kg)      Studies/Labs Reviewed:   EKG:  EKG is not ordered today.    Recent Labs: 02/21/2018: BUN 17; Creatinine, Ser 0.71; Magnesium 2.0; Potassium 3.2; Sodium 137; TSH 1.270   Lipid Panel    Component Value Date/Time   CHOL 235 (H) 03/02/2016 0859   TRIG 72 03/02/2016 0859   HDL 79 03/02/2016 0859   CHOLHDL 3.0 03/02/2016 0859   VLDL 14 03/02/2016 0859   LDLCALC 142 (H) 03/02/2016 0859   LDLDIRECT 121.7 09/08/2007 0852    Additional studies/ records that were reviewed today include:   Stress test 02/21/18  Nuclear stress EF: 65%.  Blood pressure demonstrated a normal response to exercise.  Defect 1: There is a medium defect of moderate severity present in the basal inferior, basal inferolateral, mid inferior and mid inferolateral location.  This is an intermediate risk study.  The left ventricular ejection fraction is normal (55-65%).   The study is affected by motion, there is a medium size, moderate severity reversible defect in the basal and mid inferior and inferolateral walls consistent with ischemia. Artifact can't be excluded.   Echo 11/17: Vigorous LVF, EF 65-70, normal wall motion, borderline diastolic  function, systolic bowing of mitral valve without prolapse, mild MR, trivial TR, PASP 20  Holter 8/17 Predominantly sinus.  Rare PVCs and PACs.  No significant arrhythmias.  LHC 4/06 1. LEFT MAIN CORONARY ARTERY: The left main coronary artery was normal. 2. LEFT ANTERIOR DESCENDING CORONARY ARTERY: 50% proximal LAD, mid75%-80%, Dx 30%-40%; 1st septal 60% ostial with diff distal vessel disease 3. LEFT CIRCUMFLEX CORONARY ARTERY: 50% ostial lesionin the ramus 4. RIGHT CORONARY ARTERY: 40% ostial stenosis, followed by a 99% focal stenosis; 40% lesion prior to the takeoff of the [place].  LEFT VENTRICULOGRAM: EF 60% with no significant wall motion abnormalities or mitralregurgitation.  PCI 4/06 3.5 x 13 mm Cypher DES to RCA 3 x 28 mm Cypher DES to LAD POBA to Dx   ASSESSMENT & PLAN:     1. CAD s/p remote PCIs to the LAD - Recently seen for chest pain. Follow up stress test was intermediate risk with medium size, moderate severity reversible defect in the basal and mid inferior and inferolateral walls consistent with ischemia.  He continues to have intermittent chest tightness.  Patient and daughter (over phone) had multiple questions.  All answered.  Educated for about 30 minutes.  Patient has limited availability for cath as no family member is available to take care her.  They also preferred only few cardiologist.  After discussion patient finally agreed to proceed with cardiac cath next Tuesday with Dr. Irish Lack.  2. PVC - Does have palpitation. Could be exacerbated by recent hypokalemia vs possible ischemia,   3.  Hypokalemia -On supplementation.  4.  Hyperlipidemia -Last LDL in 2017 was 142 in epic.  She continues to decline lipid clinic referral and trial of another statin agent.  Discussed in detail regarding importance of proper cholesterol management with LDL goal of less than 70.  Medication Adjustments/Labs and Tests Ordered: Current medicines are  reviewed at length with the patient  today.  Concerns regarding medicines are outlined above.  Medication changes, Labs and Tests ordered today are listed in the Patient Instructions below. Patient Instructions  Your physician recommends that you continue on your current medications as directed. Please refer to the Current Medication list given to you today.   Your physician recommends that you return for lab work in:  [place] OFFICE [address] [place] [address] [place] [address] Dept: [phone] Loc: [place]  02/23/2018  You are scheduled for a Cardiac Catheterization on Tuesday, April 2 with Dr. Larae Grooms.  1. Please arrive at the [facility] (Main Entrance A) at [facility]: [place], [place] [address] at 10:00 AM (two hours before your procedure to ensure your preparation). Free valet parking service is available.   Special note: Every effort is made to have your procedure done on time. Please understand that emergencies sometimes delay scheduled procedures.  2. Diet: Do not eat or drink anything after midnight prior to your procedure except sips of water to take medications.  3. Labs: You will need to have blood drawn on Thursday, March 28 at [facility] [address]  Open: 7:30am - 5pm    Phone: [phone]. You do not need to be fasting.  4. Medication instructions in preparation for your procedure:       On the morning of your procedure, take your Aspirin and all morning medicines.  You may use sips of water.  5. Plan for one night stay--bring personal belongings. 6. Bring a current list of your medications and current insurance cards. 7. You MUST have a responsible person to drive you home. 8. Someone MUST be with you the first 24 hours after you arrive home or  your discharge will be delayed. 9. Please wear clothes that are easy to get on and off and wear slip-on shoes.  Thank you for allowing [place] to care for you!   -- [facility] Invasive Cardiovascular services    Signed, Leanor Kail, [place]  02/23/2018 4:09 PM    [address], [place], [place]  [address] Phone: [phone]; Fax: [phone]

## 2018-02-22 NOTE — Telephone Encounter (Signed)
Spoke with patient who had some concerns about her potassium, PVCs and palpitations.. She had a nuclear stress test yesterday and lab work with a K of 7.7.[name]  This was in error..  She was told to go to the ED and because of the wait, ended up going to the ED in [facility]..  Her K was

## 2018-02-22 NOTE — Telephone Encounter (Signed)
Please let Ms. Poynor know that I agree with increased dose of potassium, as directed by the ED. Her stress test yesterday was concerning for a narrowing/blockage in one of her coronary arteries. This could also be contributing to her PVCs. I recommend that she see me or an APP within the next week to discuss further workup (i.e. cath) and repeat a BMP. Thanks.  Nelva Bush, MD [facility] Pager: [phone]

## 2018-02-22 NOTE — Telephone Encounter (Signed)
Spoke with patient and gave her Dr. Darnelle Bos recommendation.[name] She will be in @ 3:30 3/27 to see Vin because the Nuclear Stress Test showed possible narrowing in an artery (discuss cath) and her regular PVCs.. Patient verbalized understanding.[name]

## 2018-02-23 ENCOUNTER — Ambulatory Visit: Payer: PPO | Admitting: Physician Assistant

## 2018-02-23 ENCOUNTER — Encounter: Payer: Self-pay | Admitting: Physician Assistant

## 2018-02-23 VITALS — BP 146/78 | HR 70 | Wt 147.8 lb

## 2018-02-23 DIAGNOSIS — I10 Essential (primary) hypertension: Secondary | ICD-10-CM | POA: Diagnosis not present

## 2018-02-23 DIAGNOSIS — I25110 Atherosclerotic heart disease of native coronary artery with unstable angina pectoris: Secondary | ICD-10-CM

## 2018-02-23 DIAGNOSIS — Z01812 Encounter for preprocedural laboratory examination: Secondary | ICD-10-CM

## 2018-02-23 DIAGNOSIS — E7800 Pure hypercholesterolemia, unspecified: Secondary | ICD-10-CM

## 2018-02-23 DIAGNOSIS — I493 Ventricular premature depolarization: Secondary | ICD-10-CM

## 2018-02-23 NOTE — Patient Instructions (Signed)
Your physician recommends that you continue on your current medications as directed. Please refer to the Current Medication list given to you today.   Your physician recommends that you return for lab work in:  [place] OFFICE [address] [place] [address] [place] [address] Dept: [phone] Loc: [place]  02/23/2018  You are scheduled for a Cardiac Catheterization on Tuesday, April 2 with Dr. Larae Grooms.  1. Please arrive at the [facility] (Main Entrance A) at [facility]: [place], [place] [address] at 10:00 AM (two hours before your procedure to ensure your preparation). Free valet parking service is available.   Special note: Every effort is made to have your procedure done on time. Please understand that emergencies sometimes delay scheduled procedures.  2. Diet: Do not eat or drink anything after midnight prior to your procedure except sips of water to take medications.  3. Labs: You will need to have blood drawn on Thursday, March 28 at [facility] at [facility] [address]  Open: 7:30am - 5pm    Phone: [phone]. You do not need to be fasting.  4. Medication instructions in preparation for your procedure:       On the morning of your procedure, take your Aspirin and all morning medicines.  You may use sips of water.  5. Plan for one night stay--bring personal belongings. 6. Bring a current list of your medications and current insurance cards. 7. You MUST have a responsible person to drive you home. 8. Someone MUST be with you the first 24 hours after you arrive home or your discharge will be delayed. 9. Please wear clothes that are easy to get on and off and wear slip-on shoes.  Thank you for allowing [place] to care for you!   -- [place] Invasive Cardiovascular services

## 2018-02-24 ENCOUNTER — Other Ambulatory Visit: Payer: Self-pay | Admitting: Physician Assistant

## 2018-02-24 DIAGNOSIS — R002 Palpitations: Secondary | ICD-10-CM

## 2018-02-24 LAB — BASIC METABOLIC PANEL
BUN/Creatinine Ratio: 25 (ref 12–28)
BUN: 19 mg/dL (ref 8–27)
CALCIUM: 10.4 mg/dL — AB (ref 8.7–10.3)
CO2: 22 mmol/L (ref 20–29)
CREATININE: 0.76 mg/dL (ref 0.57–1.00)
Chloride: 102 mmol/L (ref 96–106)
GFR calc Af Amer: 87 mL/min/{1.73_m2} (ref >59)
GFR, EST NON AFRICAN AMERICAN: 75 mL/min/{1.73_m2} (ref >59)
Glucose: 90 mg/dL (ref 65–99)
Potassium: 4 mmol/L (ref 3.5–5.2)
Sodium: 142 mmol/L (ref 134–144)

## 2018-02-24 LAB — CBC WITH DIFFERENTIAL/PLATELET
Basophils Absolute: 0 10*3/uL (ref 0.0–0.2)
Basos: 0 %
EOS (ABSOLUTE): 0.2 10*3/uL (ref 0.0–0.4)
EOS: 4 %
HEMATOCRIT: 40.4 % (ref 34.0–46.6)
HEMOGLOBIN: 13.2 g/dL (ref 11.1–15.9)
IMMATURE GRANS (ABS): 0 10*3/uL (ref 0.0–0.1)
IMMATURE GRANULOCYTES: 0 %
LYMPHS ABS: 2.1 10*3/uL (ref 0.7–3.1)
LYMPHS: 35 %
MCH: 27.2 pg (ref 26.6–33.0)
MCHC: 32.7 g/dL (ref 31.5–35.7)
MCV: 83 fL (ref 79–97)
MONOCYTES: 8 %
Monocytes Absolute: 0.5 10*3/uL (ref 0.1–0.9)
Neutrophils Absolute: 3.2 10*3/uL (ref 1.4–7.0)
Neutrophils: 53 %
Platelets: 267 10*3/uL (ref 150–379)
RBC: 4.85 x10E6/uL (ref 3.77–5.28)
RDW: 14 % (ref 12.3–15.4)
WBC: 6 10*3/uL (ref 3.4–10.8)

## 2018-02-24 LAB — PROTIME-INR
INR: 1.1 (ref 0.8–1.2)
PROTHROMBIN TIME: 11.1 s (ref 9.1–12.0)

## 2018-02-24 NOTE — Progress Notes (Signed)
Pt has been made aware of normal result and verbalized understanding.  jw 02/24/18

## 2018-02-27 ENCOUNTER — Telehealth: Payer: Self-pay | Admitting: *Deleted

## 2018-02-27 NOTE — Telephone Encounter (Addendum)
Pt contacted pre-catheterization scheduled at [facility] for: Tuesday February 28, 2018 12:30 PM Verified arrival time and place: [place] Entrance A/[place] at: 10:30 AM Nothing to eat or drink after midnight prior to cath. Verified allergies in Epic--I discussed Iodine allergy listed in Epic  with patient. Pt states when she has salt with iodine  she has fast heart beat. Pt does note that she had local sensitivity to topical iodine as a child. Pt states she had history of allergy to seafood/shelllfish, she has been eating seafood/shellfish without problems.  Cath report 2006 does note shellfish/seafood allergy, premedication for dye allergy.          I agree. No premed.   JV        Verified AM meds can be  taken pre-cath with sip of water including: ASA 81 mg  Clopidogrel 75 mg   Confirmed patient has responsible person to drive home post procedure and observe patient for 24 hours: yes

## 2018-02-28 ENCOUNTER — Other Ambulatory Visit: Payer: Self-pay

## 2018-02-28 ENCOUNTER — Encounter (HOSPITAL_COMMUNITY): Payer: Self-pay | Admitting: General Practice

## 2018-02-28 ENCOUNTER — Ambulatory Visit (HOSPITAL_COMMUNITY)
Admission: RE | Admit: 2018-02-28 | Discharge: 2018-03-01 | Disposition: A | Discharge status: Home / Self Care | Payer: PPO | Source: Ambulatory Visit | Attending: Interventional Cardiology | Admitting: Interventional Cardiology

## 2018-02-28 ENCOUNTER — Ambulatory Visit (HOSPITAL_COMMUNITY)
Admission: RE | Disposition: A | Discharge status: Home / Self Care | Payer: Self-pay | Source: Ambulatory Visit | Attending: Interventional Cardiology

## 2018-02-28 DIAGNOSIS — R9439 Abnormal result of other cardiovascular function study: Secondary | ICD-10-CM | POA: Diagnosis not present

## 2018-02-28 DIAGNOSIS — Z888 Allergy status to other drugs, medicaments and biological substances status: Secondary | ICD-10-CM | POA: Diagnosis not present

## 2018-02-28 DIAGNOSIS — Z955 Presence of coronary angioplasty implant and graft: Secondary | ICD-10-CM | POA: Diagnosis not present

## 2018-02-28 DIAGNOSIS — I252 Old myocardial infarction: Secondary | ICD-10-CM | POA: Diagnosis not present

## 2018-02-28 DIAGNOSIS — R0789 Other chest pain: Secondary | ICD-10-CM | POA: Insufficient documentation

## 2018-02-28 DIAGNOSIS — T82858A Stenosis of vascular prosthetic devices, implants and grafts, initial encounter: Secondary | ICD-10-CM | POA: Insufficient documentation

## 2018-02-28 DIAGNOSIS — E559 Vitamin D deficiency, unspecified: Secondary | ICD-10-CM | POA: Insufficient documentation

## 2018-02-28 DIAGNOSIS — Z91018 Allergy to other foods: Secondary | ICD-10-CM | POA: Diagnosis not present

## 2018-02-28 DIAGNOSIS — Z87891 Personal history of nicotine dependence: Secondary | ICD-10-CM | POA: Diagnosis not present

## 2018-02-28 DIAGNOSIS — Z7902 Long term (current) use of antithrombotics/antiplatelets: Secondary | ICD-10-CM | POA: Diagnosis not present

## 2018-02-28 DIAGNOSIS — Z91040 Latex allergy status: Secondary | ICD-10-CM | POA: Diagnosis not present

## 2018-02-28 DIAGNOSIS — I2510 Atherosclerotic heart disease of native coronary artery without angina pectoris: Secondary | ICD-10-CM | POA: Insufficient documentation

## 2018-02-28 DIAGNOSIS — L409 Psoriasis, unspecified: Secondary | ICD-10-CM | POA: Insufficient documentation

## 2018-02-28 DIAGNOSIS — Z7982 Long term (current) use of aspirin: Secondary | ICD-10-CM | POA: Diagnosis not present

## 2018-02-28 DIAGNOSIS — Z91041 Radiographic dye allergy status: Secondary | ICD-10-CM | POA: Diagnosis not present

## 2018-02-28 DIAGNOSIS — I493 Ventricular premature depolarization: Secondary | ICD-10-CM | POA: Diagnosis not present

## 2018-02-28 DIAGNOSIS — E876 Hypokalemia: Secondary | ICD-10-CM | POA: Insufficient documentation

## 2018-02-28 DIAGNOSIS — I10 Essential (primary) hypertension: Secondary | ICD-10-CM | POA: Insufficient documentation

## 2018-02-28 DIAGNOSIS — Z85828 Personal history of other malignant neoplasm of skin: Secondary | ICD-10-CM | POA: Diagnosis not present

## 2018-02-28 DIAGNOSIS — Z79899 Other long term (current) drug therapy: Secondary | ICD-10-CM | POA: Insufficient documentation

## 2018-02-28 DIAGNOSIS — Y713 Surgical instruments, materials and cardiovascular devices (including sutures) associated with adverse incidents: Secondary | ICD-10-CM | POA: Insufficient documentation

## 2018-02-28 HISTORY — DX: Other complications of anesthesia, initial encounter: T88.59XA

## 2018-02-28 HISTORY — PX: CORONARY STENT INTERVENTION: CATH118234

## 2018-02-28 HISTORY — DX: Adverse effect of unspecified anesthetic, initial encounter: T41.45XA

## 2018-02-28 HISTORY — PX: ULTRASOUND GUIDANCE FOR VASCULAR ACCESS: SHX6516

## 2018-02-28 HISTORY — DX: Malignant (primary) neoplasm, unspecified: C80.1

## 2018-02-28 HISTORY — PX: CARDIAC CATHETERIZATION: SHX172

## 2018-02-28 HISTORY — PX: LEFT HEART CATH AND CORONARY ANGIOGRAPHY: CATH118249

## 2018-02-28 LAB — POCT ACTIVATED CLOTTING TIME
ACTIVATED CLOTTING TIME: 307 s
Activated Clotting Time: 356 seconds
Activated Clotting Time: 274 seconds

## 2018-02-28 SURGERY — LEFT HEART CATH AND CORONARY ANGIOGRAPHY
Anesthesia: LOCAL

## 2018-02-28 MED ORDER — VERAPAMIL HCL 2.5 MG/ML IV SOLN
INTRAVENOUS | Status: AC
  Filled 2018-02-28: qty 2

## 2018-02-28 MED ORDER — LIDOCAINE HCL (PF) 1 % IJ SOLN
INTRAMUSCULAR | Status: AC
  Filled 2018-02-28: qty 30

## 2018-02-28 MED ORDER — HEPARIN SODIUM (PORCINE) 1000 UNIT/ML IJ SOLN
INTRAMUSCULAR | Status: DC | PRN
  Administered 2018-02-28: 6500 [IU] via INTRAVENOUS
  Administered 2018-02-28: 2000 [IU] via INTRAVENOUS
  Administered 2018-02-28: 3500 [IU] via INTRAVENOUS

## 2018-02-28 MED ORDER — DIPHENHYDRAMINE HCL 50 MG/ML IJ SOLN
INTRAMUSCULAR | Status: AC
  Administered 2018-02-28: 25 mg via INTRAVENOUS
  Filled 2018-02-28: qty 1

## 2018-02-28 MED ORDER — ACETAMINOPHEN 325 MG PO TABS: 650.0000 mg | ORAL_TABLET | ORAL | Status: DC | PRN

## 2018-02-28 MED ORDER — ONDANSETRON HCL 4 MG/2ML IJ SOLN: 4.0000 mg | Freq: Four times a day (QID) | INTRAMUSCULAR | Status: DC | PRN

## 2018-02-28 MED ORDER — DIPHENHYDRAMINE HCL 50 MG/ML IJ SOLN
25.0000 mg | Freq: Once | INTRAMUSCULAR | Status: AC
  Administered 2018-02-28: 25 mg via INTRAVENOUS

## 2018-02-28 MED ORDER — NITROGLYCERIN 1 MG/10 ML FOR IR/CATH LAB
INTRA_ARTERIAL | Status: DC | PRN
  Administered 2018-02-28: 200 ug via INTRACORONARY
  Administered 2018-02-28: 200 ug via INTRA_ARTERIAL

## 2018-02-28 MED ORDER — VERAPAMIL HCL 2.5 MG/ML IV SOLN
INTRAVENOUS | Status: DC | PRN
  Administered 2018-02-28: 10 mL via INTRA_ARTERIAL

## 2018-02-28 MED ORDER — SODIUM CHLORIDE 0.9 % IV SOLN
INTRAVENOUS | Status: AC
  Administered 2018-02-28: 18:00:00 via INTRAVENOUS

## 2018-02-28 MED ORDER — CLOPIDOGREL BISULFATE 300 MG PO TABS
ORAL_TABLET | ORAL | Status: AC
  Filled 2018-02-28: qty 1

## 2018-02-28 MED ORDER — NON FORMULARY: 3.0000 mg | Freq: Once | Status: DC

## 2018-02-28 MED ORDER — HEPARIN (PORCINE) IN NACL 2-0.9 UNIT/ML-% IJ SOLN
INTRAMUSCULAR | Status: AC | PRN
  Administered 2018-02-28 (×2): 500 mL

## 2018-02-28 MED ORDER — MIDAZOLAM HCL 2 MG/2ML IJ SOLN
INTRAMUSCULAR | Status: DC | PRN
  Administered 2018-02-28 (×2): 1 mg via INTRAVENOUS

## 2018-02-28 MED ORDER — MELATONIN 3 MG PO TABS
3.0000 mg | ORAL_TABLET | Freq: Once | ORAL | Status: AC
  Administered 2018-02-28: 22:00:00 3 mg via ORAL
  Filled 2018-02-28: qty 1

## 2018-02-28 MED ORDER — SODIUM CHLORIDE 0.9% FLUSH: 3.0000 mL | Freq: Two times a day (BID) | INTRAVENOUS | Status: DC

## 2018-02-28 MED ORDER — CLOPIDOGREL BISULFATE 300 MG PO TABS
ORAL_TABLET | ORAL | Status: DC | PRN
  Administered 2018-02-28: 300 mg via ORAL

## 2018-02-28 MED ORDER — FENTANYL CITRATE (PF) 100 MCG/2ML IJ SOLN
INTRAMUSCULAR | Status: DC | PRN
  Administered 2018-02-28 (×2): 25 ug via INTRAVENOUS

## 2018-02-28 MED ORDER — ASPIRIN 81 MG PO TBEC: 81.0000 mg | DELAYED_RELEASE_TABLET | Freq: Every day | ORAL | Status: DC

## 2018-02-28 MED ORDER — NOREPINEPHRINE BITARTRATE 1 MG/ML IV SOLN
INTRAVENOUS | Status: DC | PRN
  Administered 2018-02-28: 5 ug/kg/min via INTRAVENOUS

## 2018-02-28 MED ORDER — METHYLPREDNISOLONE SODIUM SUCC 125 MG IJ SOLR
125.0000 mg | Freq: Once | INTRAMUSCULAR | Status: AC
  Administered 2018-02-28: 125 mg via INTRAVENOUS

## 2018-02-28 MED ORDER — MIDAZOLAM HCL 2 MG/2ML IJ SOLN
INTRAMUSCULAR | Status: AC
  Filled 2018-02-28: qty 2

## 2018-02-28 MED ORDER — LIDOCAINE HCL (PF) 1 % IJ SOLN
INTRAMUSCULAR | Status: DC | PRN
  Administered 2018-02-28: 2 mL

## 2018-02-28 MED ORDER — SODIUM CHLORIDE 0.9 % WEIGHT BASED INFUSION
3.0000 mL/kg/h | INTRAVENOUS | Status: DC
  Administered 2018-02-28: 999 mL/h via INTRAVENOUS
  Administered 2018-02-28: 3 mL/kg/h via INTRAVENOUS

## 2018-02-28 MED ORDER — LABETALOL HCL 5 MG/ML IV SOLN: 10.0000 mg | INTRAVENOUS | Status: AC | PRN

## 2018-02-28 MED ORDER — HEPARIN (PORCINE) IN NACL 2-0.9 UNIT/ML-% IJ SOLN
INTRAMUSCULAR | Status: AC
  Filled 2018-02-28: qty 1000

## 2018-02-28 MED ORDER — NITROGLYCERIN 1 MG/10 ML FOR IR/CATH LAB
INTRA_ARTERIAL | Status: AC
  Filled 2018-02-28: qty 10

## 2018-02-28 MED ORDER — SODIUM CHLORIDE 0.9% FLUSH: 3.0000 mL | INTRAVENOUS | Status: DC | PRN

## 2018-02-28 MED ORDER — SODIUM CHLORIDE 0.9 % WEIGHT BASED INFUSION: 1.0000 mL/kg/h | INTRAVENOUS | Status: DC

## 2018-02-28 MED ORDER — METHYLPREDNISOLONE SODIUM SUCC 125 MG IJ SOLR
INTRAMUSCULAR | Status: AC
  Administered 2018-02-28: 125 mg via INTRAVENOUS
  Filled 2018-02-28: qty 2

## 2018-02-28 MED ORDER — FENTANYL CITRATE (PF) 100 MCG/2ML IJ SOLN
INTRAMUSCULAR | Status: AC
  Filled 2018-02-28: qty 2

## 2018-02-28 MED ORDER — ANGIOPLASTY BOOK
Freq: Once | Status: AC
  Administered 2018-02-28: 1
  Filled 2018-02-28: qty 1

## 2018-02-28 MED ORDER — ASPIRIN 81 MG PO CHEW: 81.0000 mg | CHEWABLE_TABLET | ORAL | Status: DC

## 2018-02-28 MED ORDER — SODIUM CHLORIDE 0.9 % IV SOLN: 250.0000 mL | INTRAVENOUS | Status: DC | PRN

## 2018-02-28 MED ORDER — HEPARIN SODIUM (PORCINE) 1000 UNIT/ML IJ SOLN
INTRAMUSCULAR | Status: AC
  Filled 2018-02-28: qty 1

## 2018-02-28 MED ORDER — CLOPIDOGREL BISULFATE 75 MG PO TABS: 75.0000 mg | ORAL_TABLET | Freq: Every day | ORAL | Status: DC

## 2018-02-28 MED ORDER — HYDRALAZINE HCL 20 MG/ML IJ SOLN: 5.0000 mg | INTRAMUSCULAR | Status: AC | PRN

## 2018-02-28 MED ORDER — CLOPIDOGREL BISULFATE 75 MG PO TABS
75.0000 mg | ORAL_TABLET | Freq: Every day | ORAL | Status: DC
  Administered 2018-03-01: 09:00:00 75 mg via ORAL
  Filled 2018-02-28: qty 1

## 2018-02-28 MED ORDER — DIPHENHYDRAMINE HCL 50 MG/ML IJ SOLN
INTRAMUSCULAR | Status: DC | PRN
  Administered 2018-02-28: 25 mg via INTRAVENOUS

## 2018-02-28 MED ORDER — DIPHENHYDRAMINE HCL 50 MG/ML IJ SOLN
INTRAMUSCULAR | Status: AC
  Filled 2018-02-28: qty 1

## 2018-02-28 MED ORDER — ASPIRIN 81 MG PO CHEW
81.0000 mg | CHEWABLE_TABLET | Freq: Every day | ORAL | Status: DC
  Administered 2018-03-01: 81 mg via ORAL
  Filled 2018-02-28: qty 1

## 2018-02-28 MED ORDER — NOREPINEPHRINE 4 MG/250ML-% IV SOLN
INTRAVENOUS | Status: AC
  Filled 2018-02-28: qty 250

## 2018-02-28 MED ORDER — POTASSIUM CHLORIDE CRYS ER 20 MEQ PO TBCR
20.0000 meq | EXTENDED_RELEASE_TABLET | Freq: Every day | ORAL | Status: DC
  Administered 2018-03-01: 09:00:00 20 meq via ORAL
  Filled 2018-02-28 (×2): qty 1

## 2018-02-28 MED ORDER — IOPAMIDOL (ISOVUE-370) INJECTION 76%
INTRAVENOUS | Status: DC | PRN
  Administered 2018-02-28: 140 mL via INTRAVENOUS

## 2018-02-28 SURGICAL SUPPLY — 28 items
BALLN SAPPHIRE 2.5X12 (BALLOONS) ×3
BALLN SAPPHIRE ~~LOC~~ 3.5X12 (BALLOONS) ×1 IMPLANT
BALLN SAPPHIRE ~~LOC~~ 3.75X12 (BALLOONS) ×1 IMPLANT
BALLOON SAPPHIRE 2.5X12 (BALLOONS) IMPLANT
BAND CMPR LRG ZPHR (HEMOSTASIS) ×2
BAND ZEPHYR COMPRESS 30 LONG (HEMOSTASIS) ×1 IMPLANT
CATH IMPULSE 5F ANG/FL3.5 (CATHETERS) ×1 IMPLANT
CATH LAUNCHER 6FR EBU 3 (CATHETERS) ×1 IMPLANT
CATH VISTA GUIDE 6FR XBRCA (CATHETERS) ×1 IMPLANT
COVER PRB 48X5XTLSCP FOLD TPE (BAG) IMPLANT
COVER PROBE 5X48 (BAG) ×3
GUIDEWIRE INQWIRE 1.5J.035X260 (WIRE) IMPLANT
INQWIRE 1.5J .035X260CM (WIRE) ×3
KIT ENCORE 26 ADVANTAGE (KITS) ×1 IMPLANT
KIT HEART LEFT (KITS) ×3 IMPLANT
KIT HEMO VALVE WATCHDOG (MISCELLANEOUS) ×1 IMPLANT
NDL PERC 21GX4CM (NEEDLE) IMPLANT
NEEDLE PERC 21GX4CM (NEEDLE) ×3 IMPLANT
PACK CARDIAC CATHETERIZATION (CUSTOM PROCEDURE TRAY) ×3 IMPLANT
SHEATH RAIN RADIAL 21G 6FR (SHEATH) ×1 IMPLANT
STENT SYNERGY DES 2.75X28 (Permanent Stent) ×1 IMPLANT
STENT SYNERGY DES 3.5X32 (Permanent Stent) ×1 IMPLANT
STENT SYNERGY DES 3X24 (Permanent Stent) ×1 IMPLANT
TRANSDUCER W/STOPCOCK (MISCELLANEOUS) ×3 IMPLANT
TUBING CIL FLEX 10 FLL-RA (TUBING) ×3 IMPLANT
WIRE ASAHI PROWATER 180CM (WIRE) ×1 IMPLANT
WIRE FIGHTER CROSSING 190CM (WIRE) ×1 IMPLANT
WIRE HI TORQ VERSACORE-J 145CM (WIRE) ×1 IMPLANT

## 2018-02-28 NOTE — Interval H&P Note (Signed)
Cath Lab Visit (complete for each Cath Lab visit)  Clinical Evaluation Leading to the Procedure:   ACS: No.  Non-ACS:    Anginal Classification: CCS II  Anti-ischemic medical therapy: Minimal Therapy (1 class of medications)  Non-Invasive Test Results: Intermediate-risk stress test findings: cardiac mortality 1-3%/year  Prior CABG: No previous CABG      History and Physical Interval Note:  02/28/2018 1:04 PM  [name]  has presented today for surgery, with the diagnosis of abnormal myoview  The various methods of treatment have been discussed with the patient and family. After consideration of risks, benefits and other options for treatment, the patient has consented to  Procedure(s): LEFT HEART CATH AND CORONARY ANGIOGRAPHY (N/A) as a surgical intervention .  The patient's history has been reviewed, patient examined, no change in status, stable for surgery.  I have reviewed the patient's chart and labs.  Questions were answered to the patient's satisfaction.     Larae Grooms

## 2018-02-28 NOTE — Progress Notes (Signed)
Patient arrived from cath lab at 1730 with small hematoma (3 cm) proximal to radial band.  Manual pressure applied for 10 minutes and coban wrap applied.  Has remained stable since that time.  Nite shift to initiate deflations.  Patient, family educated on protocol, when to call RN.  Arm maintained elevated, ice applied.

## 2018-03-01 ENCOUNTER — Encounter (HOSPITAL_COMMUNITY): Payer: Self-pay | Admitting: Interventional Cardiology

## 2018-03-01 ENCOUNTER — Other Ambulatory Visit: Payer: Self-pay | Admitting: *Deleted

## 2018-03-01 DIAGNOSIS — Z91040 Latex allergy status: Secondary | ICD-10-CM | POA: Diagnosis not present

## 2018-03-01 DIAGNOSIS — R9439 Abnormal result of other cardiovascular function study: Secondary | ICD-10-CM

## 2018-03-01 DIAGNOSIS — I10 Essential (primary) hypertension: Secondary | ICD-10-CM

## 2018-03-01 DIAGNOSIS — I25118 Atherosclerotic heart disease of native coronary artery with other forms of angina pectoris: Secondary | ICD-10-CM

## 2018-03-01 DIAGNOSIS — T82858A Stenosis of vascular prosthetic devices, implants and grafts, initial encounter: Secondary | ICD-10-CM | POA: Diagnosis not present

## 2018-03-01 DIAGNOSIS — I2510 Atherosclerotic heart disease of native coronary artery without angina pectoris: Secondary | ICD-10-CM | POA: Diagnosis not present

## 2018-03-01 DIAGNOSIS — E782 Mixed hyperlipidemia: Secondary | ICD-10-CM

## 2018-03-01 DIAGNOSIS — I493 Ventricular premature depolarization: Secondary | ICD-10-CM | POA: Diagnosis not present

## 2018-03-01 DIAGNOSIS — Z91041 Radiographic dye allergy status: Secondary | ICD-10-CM | POA: Diagnosis not present

## 2018-03-01 DIAGNOSIS — R0789 Other chest pain: Secondary | ICD-10-CM | POA: Diagnosis not present

## 2018-03-01 DIAGNOSIS — Z955 Presence of coronary angioplasty implant and graft: Secondary | ICD-10-CM | POA: Diagnosis not present

## 2018-03-01 DIAGNOSIS — Z888 Allergy status to other drugs, medicaments and biological substances status: Secondary | ICD-10-CM | POA: Diagnosis not present

## 2018-03-01 DIAGNOSIS — Z91018 Allergy to other foods: Secondary | ICD-10-CM | POA: Diagnosis not present

## 2018-03-01 DIAGNOSIS — Z7902 Long term (current) use of antithrombotics/antiplatelets: Secondary | ICD-10-CM | POA: Diagnosis not present

## 2018-03-01 LAB — BASIC METABOLIC PANEL
ANION GAP: 9 (ref 5–15)
BUN: 21 mg/dL — AB (ref 6–20)
CO2: 21 mmol/L — ABNORMAL LOW (ref 22–32)
Calcium: 9.1 mg/dL (ref 8.9–10.3)
Chloride: 107 mmol/L (ref 101–111)
Creatinine, Ser: 0.73 mg/dL (ref 0.44–1.00)
GFR calc Af Amer: >60 mL/min (ref >60)
Glucose, Bld: 157 mg/dL — ABNORMAL HIGH (ref 65–99)
POTASSIUM: 3.6 mmol/L (ref 3.5–5.1)
SODIUM: 137 mmol/L (ref 135–145)

## 2018-03-01 LAB — CBC
HEMATOCRIT: 39 % (ref 36.0–46.0)
Hemoglobin: 12.4 g/dL (ref 12.0–15.0)
MCH: 26.7 pg (ref 26.0–34.0)
MCHC: 31.8 g/dL (ref 30.0–36.0)
MCV: 83.9 fL (ref 78.0–100.0)
Platelets: 219 10*3/uL (ref 150–400)
RBC: 4.65 MIL/uL (ref 3.87–5.11)
RDW: 13.6 % (ref 11.5–15.5)
WBC: 8.4 10*3/uL (ref 4.0–10.5)

## 2018-03-01 MED ORDER — NITROGLYCERIN 0.4 MG SL SUBL: 0.4000 mg | SUBLINGUAL_TABLET | SUBLINGUAL | 0 refills | Status: DC | PRN

## 2018-03-01 NOTE — Progress Notes (Signed)
[facility] BAND REMOVAL  LOCATION:    right radial  DEFLATED PER PROTOCOL:    Yes.    TIME BAND OFF / DRESSING APPLIED:    23:00   SITE UPON ARRIVAL:    Level 1  SITE AFTER BAND REMOVAL:    Level 1  CIRCULATION SENSATION AND MOVEMENT:    Within Normal Limits   Yes.    COMMENTS:   Post TR band instructions given. Pt tolerated well.

## 2018-03-01 NOTE — Progress Notes (Signed)
CARDIAC REHAB PHASE I   PRE:  Rate/Rhythm: 80 SR    BP: sitting 141/62    SaO2:   MODE:  Ambulation: 500 ft   POST:  Rate/Rhythm: 119 ST    BP: sitting 161/63     SaO2:   Tolerated fairly well. SOB toward end with talking and HR elevated. No palpitations or ectopy. Ed completed with pt and daughter. Understands importance of Plavix. Will refer to [place] however pt is unsure at this time as she cares for her husband with early dementia.  [id number]   [place], ACSM 03/01/2018 9:15 AM

## 2018-03-01 NOTE — Discharge Summary (Addendum)
Discharge Summary    Patient ID: Erika Brown,  MRN: [id number], DOB/AGE: February 28, 1941 77 y.o.  Admit date: 02/28/2018 Discharge date: 03/01/2018  Primary Care Provider: Burnard Bunting Primary Cardiologist: Nelva Bush, MD  Discharge Diagnoses    Active Problems:   Abnormal stress test   Allergies Allergies  Allergen Reactions  . Iodine Other (See Comments)    Has had some SHOB and rapid heart rate in the past  . Latex Other (See Comments)    discomfort  . Other Other (See Comments)    Some foods b/c of preservatives   . Statins Other (See Comments)    Intolerance to high dosages, myalgia    Diagnostic Studies/Procedures    CORONARY STENT INTERVENTION  LEFT HEART CATH AND CORONARY ANGIOGRAPHY  02/28/18  Conclusion    Previously placed Ost RCA to Prox RCA stent (unknown type) is widely patent.  Prox RCA lesion is 40% stenosed. Mid RCA lesion is 99% stenosed.  A drug-eluting stent was successfully placed using a STENT SYNERGY DES 3.5X32, postdilated to 3.75 mm.  Post intervention, there is a 0% residual stenosis.  Prox LAD to Mid LAD lesion is 80% stenosed. Mid LAD lesion is 80% stenosed. Part of the lesion was instent restenosis and the other part de novo.  A drug-eluting stent was successfully placed using a STENT SYNERGY DES 3X24, postdilated to 3.5 mm.  Post intervention, there is a 0% residual stenosis.  Mid Cx lesion is 90% stenosed.  A drug-eluting stent was successfully placed using a STENT SYNERGY DES 2.75X28.  Post intervention, there is a 0% residual stenosis.  The left ventricular systolic function is normal.  LV end diastolic pressure is normal.  The left ventricular ejection fraction is 55-65% by visual estimate.  There is no aortic valve stenosis.   Continue dual antiplatelet therapy for at least 1 year and likely beyond, along with aggressive secondary prevention.    _____________ Diagnostic Diagram       Post-Intervention  Diagram          History of Present Illness     Erika Brown is a 77 y.o. female with history of CAD s/p remote PCIs to the LAD, RCA, and diagonal (2006), HTN, HLD, and palpitations presents for follow up.   She was previously followed in our office by Dr. Aundra Dubin. She declined referral to the lipid clinic to discuss PCSK9 inhibitor therapy in the setting of statin intolerance. Seen by Dr. Saunders Revel 02/09/18 for palpitations and stress test. Her palpitations is consistent with PVCs seen on EKG today. She has also had PACs in the past on the [employer] monitor. Treated for hypokalemia. Stress test on 02/21/18 was intermediate risk with medium size, moderate severity reversible defect in the basal and mid inferior and inferolateral walls consistent with ischemia.   She continued to have mild intermittent chest tightness.  Denies orthopnea, PND, syncope, lower extremity edema, melena, palpitation or blood in his stool or urine.  She cannot recall which cholesterol medications she has tried previously.  She continues to decline referral to lipid clinic.   After discussion with pt it was decided to proceed with cardiac cath.    Hospital Course     Consultants: None  Erika Brown underwent cardiac catheterization on 02/28/18 and was found to have a 99% mid RCA lesion that was stented with a SYNERGY DES 3.5X32, postdilated to 3.75 mm.  Her previously placed RCA stent was widely patent.  She also had an 80%  stenosed mid LAD lesion, part of the lesion was in-stent restenosis and the other part was de novo. A drug-eluting stent was successfully placed using a STENT SYNERGY DES 3X24, postdilated to 3.5 mm.  A third Synergy DES was placed to a 90% mid circumflex lesion.  The left ventricular systolic function was normal with EF 55-65% by visual estimate.  LV end-diastolic pressure normal.  There was no aortic valve stenosis.  The patient will be continued on dual antiplatelet therapy for at least one year and  likely be on, along with aggressive secondary prevention.   She was monitored overnight and vital signs remained stable.  Her blood pressure was a little low post cath.  We will have her resume her home blood pressure medications tomorrow.  Patient had prior complaints of palpitations.  Her telemetry monitoring revealed sinus rhythm with occasional PVCs and no other ectopy.  The patient was hypokalemic on precath labs with K+ 3.2.  She received supplementation.  Potassium today is 3.6.  Will continue her K-Dur.  Serum creatinine is stable post cath at 0.73.  Last LDL in 2017 was 142 in epic.  She has repeatedly declined lipid clinic referral and trial of another statin agent.  The patient would greatly benefit from lowering her LDL.  Patient has been seen by Dr. Irish Lack today and deemed ready for discharge home. All follow up appointments have been scheduled. Discharge medications are listed below.  _____________  Discharge Vitals Blood pressure (!) 132/54, pulse 67, temperature 97.8 F (36.6 C), temperature source Oral, resp. rate 13, height 5\' 4"  (1.626 m), weight 147 lb 4.3 oz (66.8 kg), last menstrual period 11/29/1974, SpO2 95 %.  Filed Weights   02/28/18 1112 03/01/18 0335  Weight: 146 lb (66.2 kg) 147 lb 4.3 oz (66.8 kg)    Labs & Radiologic Studies    CBC Recent Labs    03/01/18 0329  WBC 8.4  HGB 12.4  HCT 39.0  MCV 83.9  PLT 606   Basic Metabolic Panel Recent Labs    03/01/18 0329  NA 137  K 3.6  CL 107  CO2 21*  GLUCOSE 157*  BUN 21*  CREATININE 0.73  CALCIUM 9.1   Liver Function Tests No results for input(s): AST, ALT, ALKPHOS, BILITOT, PROT, ALBUMIN in the last 72 hours. No results for input(s): LIPASE, AMYLASE in the last 72 hours. Cardiac Enzymes No results for input(s): CKTOTAL, CKMB, CKMBINDEX, TROPONINI in the last 72 hours. BNP Invalid input(s): POCBNP D-Dimer No results for input(s): DDIMER in the last 72 hours. Hemoglobin A1C No results for  input(s): HGBA1C in the last 72 hours. Fasting Lipid Panel No results for input(s): CHOL, HDL, LDLCALC, TRIG, CHOLHDL, LDLDIRECT in the last 72 hours. Thyroid Function Tests No results for input(s): TSH, T4TOTAL, T3FREE, THYROIDAB in the last 72 hours.  Invalid input(s): FREET3 _____________  No results found. Disposition   Pt is being discharged home today in good condition.  Follow-up Plans & Appointments    Follow-up Information    Liliane Shi, PA-C Follow up.   Specialties:  Cardiology, Physician Assistant Why:  Keep your appointment with Richardson Dopp on April 19th at 9:45.  Please arrive 15 minutes early for checkin.  Contact information: [address] [address] [phone]          Discharge Instructions    Diet - low sodium heart healthy   Complete by:  As directed    Discharge instructions  Complete by:  As directed    You can resume your usual medications tomorrow. No blood pressure medications for today.   PLEASE REMEMBER TO BRING ALL OF YOUR MEDICATIONS TO EACH OF YOUR FOLLOW-UP OFFICE VISITS.  PLEASE ATTEND ALL SCHEDULED FOLLOW-UP APPOINTMENTS.   Activity: Increase activity slowly as tolerated. You may shower, but no soaking baths (or swimming) for 1 week. No driving for 1 week. No lifting over 10 lbs for 2 weeks. No sexual activity for 2 weeks.   You May Return to Work: in 1 week (if applicable)  Wound Care: You may wash cath site gently with soap and water. Keep cath site clean and dry. If you notice pain, swelling, bleeding or pus at your cath site, please call [phone].   Increase activity slowly   Complete by:  As directed       Discharge Medications   Allergies as of 03/01/2018      Reactions   Iodine Other (See Comments)   Has had some SHOB and rapid heart rate in the past   Latex Other (See Comments)   discomfort   Other Other (See Comments)   Some foods b/c of preservatives    Statins Other (See  Comments)   Intolerance to high dosages, myalgia      Medication List    TAKE these medications   amLODipine 5 MG tablet Commonly known as:  NORVASC TAKE 1 TABLET EACH DAY.   aspirin 81 MG EC tablet Take 81 mg by mouth daily.   clopidogrel 75 MG tablet Commonly known as:  PLAVIX TAKE 1 TABLET ONCE DAILY.   ergocalciferol 50000 units capsule Commonly known as:  VITAMIN D2 Take 50,000 Units by mouth every Friday.   irbesartan-hydrochlorothiazide 300-12.5 MG tablet Commonly known as:  AVALIDE Take 1 tablet by mouth daily.   meclizine 25 MG tablet Commonly known as:  ANTIVERT Take 12.5 mg by mouth 3 (three) times daily as needed for dizziness.   metoprolol succinate 25 MG 24 hr tablet Commonly known as:  TOPROL-XL TAKE 1 TABLET ONCE DAILY.   nitroGLYCERIN 0.4 MG SL tablet Commonly known as:  NITROSTAT DISSOLVE 1 TABLET UNDER TONGUE AS NEEDED FOR CHEST PAIN,MAY REPEAT IN5 MINUTES UP TO 3 DOSES. What changed:    how much to take  how to take this  when to take this  reasons to take this  additional instructions   potassium chloride SA 20 MEQ tablet Commonly known as:  K-DUR,KLOR-CON Take 20 mEq by mouth daily.      Aspirin prescribed at discharge?  Yes High Intensity Statin Prescribed? (Lipitor 40-80mg  or Crestor 20-40mg ): No: pt refuses Beta Blocker Prescribed? Yes For EF <40%, was ACEI/ARB Prescribed? No: normal EF ADP Receptor Inhibitor Prescribed? (i.e. Plavix etc.-Includes Medically Managed Patients): Yes For EF <40%, Aldosterone Inhibitor Prescribed? No: normal EF Was EF assessed during THIS hospitalization? Yes Was Cardiac Rehab II ordered? (Included Medically managed Patients): Yes   Outstanding Labs/Studies   None  Duration of Discharge Encounter   Greater than 30 minutes including physician time.  Signed, Daune Perch NP 03/01/2018, 8:46 AM  I have examined the patient and reviewed assessment and plan and discussed with patient.  Agree  with above as stated.   GEN: Well nourished, well developed, in no acute distress  HEENT: normal  Neck: no JVD, carotid bruits, or masses Cardiac: RRR; no murmurs, rubs, or gallops,no edema  Respiratory:  clear to auscultation bilaterally, normal work of breathing GI: soft, nontender, nondistended,  MS: no deformity or atrophy ; small right wrist hematoma with bruising.  Forearm is soft. Skin: warm and dry, no rash Neuro:  Strength and sensation are intact Psych: euthymic mood, full affect  I stressed the importance of DAPT.  She will need consideration for lipid lowering therapy as an outpatient to help avoid progression of CAD.  WOuld recommend referral to lipid clinic.  SHe may be more motivated to accept this referral now that she required multivessel stenting.    Restart BP meds tomorrow.  She had low BP post procedure yesterday.  I suspect her BP will increase as she gets back to her regular activities.   Erika Brown

## 2018-03-03 ENCOUNTER — Telehealth (HOSPITAL_COMMUNITY): Payer: Self-pay

## 2018-03-03 MED FILL — Heparin Sodium (Porcine) 2 Unit/ML in Sodium Chloride 0.9%: INTRAMUSCULAR | Qty: 1000 | Status: AC

## 2018-03-03 NOTE — Telephone Encounter (Signed)
Patients insurance is active and benefits verified through [facility] - $15.00 co-pay, no deductible, out of pocket amount of $3,400/$0.00 has been met, no co-insurance, and no pre-authorization is required. Reference [id number]  Patient will be contacted and scheduled upon review by the RN Navigator.

## 2018-03-06 ENCOUNTER — Telehealth (HOSPITAL_COMMUNITY): Payer: Self-pay

## 2018-03-06 NOTE — Telephone Encounter (Signed)
Attempted to call patient in regards to Cardiac Rehab - lm on vm °

## 2018-03-07 ENCOUNTER — Ambulatory Visit: Payer: PPO | Admitting: Physician Assistant

## 2018-03-07 ENCOUNTER — Encounter: Payer: Self-pay | Admitting: Physician Assistant

## 2018-03-07 ENCOUNTER — Telehealth: Payer: Self-pay | Admitting: Internal Medicine

## 2018-03-07 VITALS — BP 124/82 | HR 81 | Ht 64.0 in | Wt 146.8 lb

## 2018-03-07 DIAGNOSIS — E7800 Pure hypercholesterolemia, unspecified: Secondary | ICD-10-CM | POA: Diagnosis not present

## 2018-03-07 DIAGNOSIS — I2510 Atherosclerotic heart disease of native coronary artery without angina pectoris: Secondary | ICD-10-CM

## 2018-03-07 DIAGNOSIS — I10 Essential (primary) hypertension: Secondary | ICD-10-CM | POA: Diagnosis not present

## 2018-03-07 DIAGNOSIS — T148XXA Other injury of unspecified body region, initial encounter: Secondary | ICD-10-CM | POA: Diagnosis not present

## 2018-03-07 DIAGNOSIS — I493 Ventricular premature depolarization: Secondary | ICD-10-CM | POA: Diagnosis not present

## 2018-03-07 NOTE — Telephone Encounter (Signed)
Please try to have her see an APP or DOD today. Thanks.  Nelva Bush, MD [facility] Pager: [phone]

## 2018-03-07 NOTE — Telephone Encounter (Signed)
Spoke with patient about a lump which has developed at her cath site.. (wrist)   It is red in color and about the size of a dime with no pain..  She has been keeping warm compresses on it which have helped..  Please advise, thank you

## 2018-03-07 NOTE — Telephone Encounter (Signed)
New Message   Patient had a heart cath last week. She has since developed a knot at the incision site. Please call to discuss.

## 2018-03-07 NOTE — Progress Notes (Signed)
Cardiology Office Note:    Date:  03/07/2018   ID:  Erika Brown, DOB 1941/06/03, MRN [id number]  PCP:  Burnard Bunting, MD  Cardiologist:  Nelva Bush, MD   Referring MD: Burnard Bunting, MD   Chief Complaint  Patient presents with  . Hospitalization Follow-up    s/p PCI  . R wrist swelling    History of Present Illness:    Erika Brown is a 77 y.o. female with CAD s/p NSTEMI 4/06 tx with Cypher DES to LAD, Cypher DES to the RCA and POBA to Dx, HTN, HL, palpitations. Myoview in 2009 was low risk with mild inferior ischemia.  Echocardiogram 11/17 demonstrated normal function.  She was evaluated by Dr. Saunders Revel in March 2019 for palpitations, PVCs and nonexertional jaw pain.  A Nuclear stress test was abnormal with inferior and inferolateral ischemia.  She was set up for Cardiac Catheterization 02/28/18 which demonstrated a patent stent in the RCA.  But, there was severe in-stent restenosis in the proximal LAD, severe mid LCx stenosis and severe stenosis in the mid RCA beyond the previous stent.  She underwent multivessel PCI with DES to the prox-mid LAD, DES to mid LCx and DES to prox-mid RCA.  Post PCI was uneventful.    Erika Brown returns for post hospitalization follow up.  She is here today with her husband.  Since DC, she has been doing well. She denies chest pain, shortness of breath.  She denies paroxysmal nocturnal dyspnea, edema, syncope.  She noticed an area of swelling yesterday over her cath site.  This was new since DC.  There is some mild discomfort.  She has noted ecchymosis since DC.    Prior CV studies:   The following studies were reviewed today:  Cardiac catheterization 02/28/18 LAD proximal 80 ISR, mid 80 LCx mid 90 RCA ostial stent patent, proximal 40, mid 99   EF 55-65 PCI: 3 x 24 mm Synergy DES to the proximal-mid LAD PCI: 2.75 x 20 mm Synergy DES to the mid LCx PCI: 3.5 x 32 mm Synergy DES to the proximal-mid RCA         Nuclear stress test 02/21/18 EF  65, inferior/inferolateral reversible defect consistent with ischemia, intermediate risk  Echo 11/17: Vigorous LVF, EF 65-70, normal wall motion, borderline diastolic function, systolic bowing of mitral valve without prolapse, mild MR, trivial TR, PASP 20  Holter 8/17 Predominantly sinus.  Rare PVCs and PACs.  No significant arrhythmias.  LHC 4/06 1. LEFT MAIN CORONARY ARTERY: The left main coronary artery was normal. 2. LEFT ANTERIOR DESCENDING CORONARY ARTERY: 50% proximal LAD, mid75%-80%, Dx 30%-40%; 1st septal 60% ostial with diff distal vessel disease 3. LEFT CIRCUMFLEX CORONARY ARTERY: 50% ostial lesionin the ramus 4. RIGHT CORONARY ARTERY: 40% ostial stenosis, followed by a 99% focal stenosis; 40% lesion prior to the takeoff of the [place].  LEFT VENTRICULOGRAM: EF 60% with no significant wall motion abnormalities or mitralregurgitation.  PCI 4/06 3.5 x 13 mm Cypher DES to RCA 3 x 28 mm Cypher DES to LAD POBA to Dx  Past Medical History:  Diagnosis Date  . Anxiety and depression   . CAD (coronary artery disease)    S/P NSTEMI 4/06 EF 60%; Treated with Cypher DES to LAD and RCA with kissing balloon PCI diagonal; Treadmill myoview 10/09 for CP: Walked 7:06. mild ST-T-wave changes in recovery. EF 71%. Very mild reversible defect in base of the inferior wall --> medical rx.  . Cancer ([place])  HISTORY OF SKIN CANCER  . Complication of anesthesia    " DIFFICULT TO WAKE UP "  . Depression   . Glucose intolerance (impaired glucose tolerance)   . Heart murmur   . History of echocardiogram    Echo 11/17: Vigorous LVF, EF 65-70, normal wall motion, borderline diastolic function, systolic bowing of mitral valve without prolapse, mild MR, trivial TR, PASP 20  . History of medication noncompliance   . Hyperlipidemia    Intolerate to multiple statins due to severe myalgias  . Hypertension    Possible white-coat component  . MI (myocardial infarction) ([place])     acute-s/p stent  . Microhematuria   . Psoriasis   . Rapid heart beat   . Vitamin D deficiency    Surgical Hx: The patient  has a past surgical history that includes Coronary stent placement; Appendectomy; Abdominal hysterectomy; Mohs surgery (9/14); Breast biopsy; Tonsillectomy and adenoidectomy; bladder tact; Cataract extraction (Bilateral, 2017); Coronary stent placement (04/02/019); Cardiac catheterization (02/28/2018); LEFT HEART CATH AND CORONARY ANGIOGRAPHY (N/A, 02/28/2018); Ultrasound guidance for vascular access (02/28/2018); and CORONARY STENT INTERVENTION (N/A, 02/28/2018).   Current Medications: Current Meds  Medication Sig  . amLODipine (NORVASC) 5 MG tablet TAKE 1 TABLET EACH DAY.  [name] aspirin 81 MG EC tablet Take 81 mg by mouth daily.    . clopidogrel (PLAVIX) 75 MG tablet TAKE 1 TABLET ONCE DAILY.  . ergocalciferol (VITAMIN D2) 50000 UNITS capsule Take 50,000 Units by mouth every Friday.   . irbesartan-hydrochlorothiazide (AVALIDE) 300-12.5 MG tablet Take 1 tablet by mouth daily.  . meclizine (ANTIVERT) 25 MG tablet Take 12.5 mg by mouth 3 (three) times daily as needed for dizziness.   . metoprolol succinate (TOPROL-XL) 25 MG 24 hr tablet TAKE 1 TABLET ONCE DAILY.  . nitroGLYCERIN (NITROSTAT) 0.4 MG SL tablet Place 1 tablet (0.4 mg total) under the tongue every 5 (five) minutes as needed for chest pain.  . potassium chloride SA (K-DUR,KLOR-CON) 20 MEQ tablet Take 20 mEq by mouth daily.     Allergies:   Iodine; Latex; Other; and Statins   Social History   Tobacco Use  . Smoking status: Former Research scientist (life sciences)  . Smokeless tobacco: Never Used  Substance Use Topics  . Alcohol use: Yes    Alcohol/week: 2.4 oz    Types: 4 Glasses of wine per week    Comment: glass of wine at dinner most nights  . Drug use: No     Family Hx: The patient's family history includes Asthma in her brother; Breast cancer (age of onset: 22) in her sister; Cancer in her daughter; Depression in her daughter;  Diabetes in her paternal grandmother; Heart attack in her father; Heart disease in her father; Hypertension in her brother and mother; Melanoma in her child and mother; Rectal cancer in her son; Renal Disease in her maternal grandfather; Stroke in her maternal grandfather and maternal grandmother; Ulcers in her father. There is no history of Coronary artery disease.  ROS:   Please see the history of present illness.    ROS All other systems reviewed and are negative.   EKGs/Labs/Other Test Reviewed:    EKG:  EKG is  ordered today.  The ekg ordered today demonstrates normal sinus rhythm, HR 81, normal axis, QTc 432 ms, no significant changes.   Recent Labs: 02/21/2018: Magnesium 2.0; TSH 1.270 03/01/2018: BUN 21; Creatinine, Ser 0.73; Hemoglobin 12.4; Platelets 219; Potassium 3.6; Sodium 137   Recent Lipid Panel Lab Results  Component Value Date/Time  CHOL 235 (H) 03/02/2016 08:59 AM   TRIG 72 03/02/2016 08:59 AM   HDL 79 03/02/2016 08:59 AM   CHOLHDL 3.0 03/02/2016 08:59 AM   LDLCALC 142 (H) 03/02/2016 08:59 AM   LDLDIRECT 121.7 09/08/2007 08:52 AM    Physical Exam:    VS:  BP 124/82   Pulse 81   Ht 5\' 4"  (1.626 m)   Wt 146 lb 12.8 oz (66.6 kg)   LMP 11/29/1974   BMI 25.20 kg/m     Wt Readings from Last 3 Encounters:  03/07/18 146 lb 12.8 oz (66.6 kg)  03/01/18 147 lb 4.3 oz (66.8 kg)  02/23/18 147 lb 12.8 oz (67 kg)     Physical Exam  Constitutional: She is oriented to person, place, and time. She appears well-developed and well-nourished. No distress.  HENT:  Head: Normocephalic and atraumatic.  Neck: No JVD present.  Cardiovascular: Normal rate and regular rhythm.  No murmur heard. Pulmonary/Chest: Effort normal. She has no wheezes. She has no rales.  Abdominal: Soft.  Musculoskeletal: She exhibits no edema.  R wrist mass fluctuant and minimally tender; no pulsatility; +R radial pulse; good cap refill in fingers of R hand; Lg area of ecchymosis noted R forearm    Neurological: She is alert and oriented to person, place, and time.  Skin: Skin is warm and dry.    ASSESSMENT & PLAN:    #1.  Hematoma  R wrist hematoma 2/2 to recent Cardiac Catheterization.  I doubt she has a pseudoaneurysm.  But, this is new since yesterday.  I have asked her to use ice for swelling and discomfort.  I will order an ultrasound to rule out pseudoaneurysm and check a CBC today.  She can continue to be cautious with her R wrist until this improves.    #2.  Coronary artery disease involving native coronary artery of native heart without angina pectoris S/p NSTEMI in 2006 tx with DES to the LAD, DEs to the RCA and POBA to the Dx.  She is now s/p multivessel stenting with DES to the LAD, DES to the LCx and DES to the RCA.  She will need to continue on indefinite dual antiplatelet Rx.  I advised her that her Plavix and ASA cannot be interrupted for the next 1 year.  She is not interested in cardiac rehab.  She is intol of statins.  Continue ASA, Plavix, beta-blocker.  #3.  Essential hypertension The patient's blood pressure is controlled on her current regimen.  Continue current therapy.    #4.  Pure hypercholesterolemia We discussed the importance of aggressive cholesterol management for secondary prevention.  I have recommended that we refer her to the CVRR clinic to discuss +/- PCSK-9 inhibitor Rx.  She would like to think about this.  I have provided her with the names of [place] and [place].  She should call us if she decides to try one of these medications.  #5.  PVC's (premature ventricular contractions) She has had less symptoms since her PCI.   Dispo:  Return in about 3 months (around 06/06/2018) for Routine Follow Up, w/ Dr. Saunders Revel, or Richardson Dopp, PA-C.   Medication Adjustments/Labs and Tests Ordered: Current medicines are reviewed at length with the patient today.  Concerns regarding medicines are outlined above.  Tests Ordered: Orders Placed This Encounter   Procedures  . CBC  . EKG 12-Lead   Medication Changes: No orders of the defined types were placed in this encounter.   Signed, Richardson Dopp,  PA-C  03/07/2018 2:31 PM    [place], [place], [place]  [address] Phone: [phone]; Fax: [phone]

## 2018-03-07 NOTE — Telephone Encounter (Signed)
Spoke with patient and she will be in today @ 1:45 with Event organiser.[name]

## 2018-03-07 NOTE — Patient Instructions (Addendum)
Medication Instructions:  No changes  Labwork: Today - CBC   Testing/Procedures: Schedule R radial artery ultrasound; [name] Your physician has requested that you have a  RIGHT upper extremity arterial duplex. This test is an ultrasound of the arteries in the legs or arms. It looks at arterial blood flow in the legs and arms. Allow one hour for Lower and Upper Arterial scans. There are no restrictions or special instructions    Follow-Up: Nelva Bush, MD or Richardson Dopp, PA-C in 3 months  Any Other Special Instructions Will Be Listed Below (If Applicable). The type of cholesterol medication that we should consider for you is called a PCSK-9 inhibitor The 2 most common agents are: Praluent (Alirocumab) Repatha (Evolocumab)  Call if you are ready to schedule an appointment with our [facility] to discuss this further with our pharmacist.  If you need a refill on your cardiac medications before your next appointment, please call your pharmacy.

## 2018-03-08 ENCOUNTER — Ambulatory Visit (HOSPITAL_COMMUNITY)
Admission: RE | Admit: 2018-03-08 | Discharge: 2018-03-08 | Disposition: A | Discharge status: Home / Self Care | Payer: PPO | Source: Ambulatory Visit | Attending: Cardiovascular Disease | Admitting: Cardiovascular Disease

## 2018-03-08 ENCOUNTER — Telehealth: Payer: Self-pay | Admitting: *Deleted

## 2018-03-08 DIAGNOSIS — Z955 Presence of coronary angioplasty implant and graft: Secondary | ICD-10-CM | POA: Diagnosis not present

## 2018-03-08 DIAGNOSIS — E785 Hyperlipidemia, unspecified: Secondary | ICD-10-CM | POA: Diagnosis not present

## 2018-03-08 DIAGNOSIS — X58XXXA Exposure to other specified factors, initial encounter: Secondary | ICD-10-CM | POA: Insufficient documentation

## 2018-03-08 DIAGNOSIS — Z87891 Personal history of nicotine dependence: Secondary | ICD-10-CM | POA: Diagnosis not present

## 2018-03-08 DIAGNOSIS — T148XXA Other injury of unspecified body region, initial encounter: Secondary | ICD-10-CM | POA: Insufficient documentation

## 2018-03-08 DIAGNOSIS — I2510 Atherosclerotic heart disease of native coronary artery without angina pectoris: Secondary | ICD-10-CM | POA: Insufficient documentation

## 2018-03-08 DIAGNOSIS — I10 Essential (primary) hypertension: Secondary | ICD-10-CM | POA: Diagnosis not present

## 2018-03-08 LAB — CBC
HEMATOCRIT: 37.9 % (ref 34.0–46.6)
Hemoglobin: 12.9 g/dL (ref 11.1–15.9)
MCH: 27.7 pg (ref 26.6–33.0)
MCHC: 34 g/dL (ref 31.5–35.7)
MCV: 81 fL (ref 79–97)
Platelets: 249 10*3/uL (ref 150–379)
RBC: 4.66 x10E6/uL (ref 3.77–5.28)
RDW: 14.3 % (ref 12.3–15.4)
WBC: 6.3 10*3/uL (ref 3.4–10.8)

## 2018-03-08 NOTE — Telephone Encounter (Signed)
DPR ok to s/w pt's husband. Pt's husband said pt was taking a nap right now. Pt's husband said he will have pt call back for her results.

## 2018-03-08 NOTE — Telephone Encounter (Signed)
-----   Message from Liliane Shi, [place] sent at 03/08/2018  1:34 PM EDT ----- Hemoglobin normal. Continue current medications and follow up as planned.  Richardson Dopp, PA-C    03/08/2018 1:33 PM

## 2018-03-13 ENCOUNTER — Encounter: Payer: Self-pay | Admitting: *Deleted

## 2018-03-13 ENCOUNTER — Telehealth (HOSPITAL_COMMUNITY): Payer: Self-pay

## 2018-03-13 ENCOUNTER — Encounter (HOSPITAL_COMMUNITY): Payer: Self-pay

## 2018-03-13 NOTE — Telephone Encounter (Signed)
2nd attempt to call patient in regards to Cardiac Rehab - lm on vm. Sending letter. °

## 2018-03-17 ENCOUNTER — Ambulatory Visit: Payer: PPO | Admitting: Physician Assistant

## 2018-03-21 ENCOUNTER — Telehealth (HOSPITAL_COMMUNITY): Payer: Self-pay

## 2018-03-21 NOTE — Telephone Encounter (Signed)
Patient returned phone call in regards to Cardiac Rehab - Patient is interested in the Cardiac Rehab Program. Scheduled orientation on 05/18/2018 at 8:30am. Patient will attend the 9:45am exc class but would prefer 8:15am if a spot becomes available. Went over insurance with patient and patient verbalized understanding. Mailed packet.

## 2018-03-30 ENCOUNTER — Emergency Department (HOSPITAL_BASED_OUTPATIENT_CLINIC_OR_DEPARTMENT_OTHER)
Admission: EM | Admit: 2018-03-30 | Discharge: 2018-03-31 | Disposition: A | Discharge status: Home / Self Care | Payer: PPO | Attending: Emergency Medicine | Admitting: Emergency Medicine

## 2018-03-30 ENCOUNTER — Emergency Department (HOSPITAL_BASED_OUTPATIENT_CLINIC_OR_DEPARTMENT_OTHER): Payer: PPO

## 2018-03-30 ENCOUNTER — Other Ambulatory Visit: Payer: Self-pay

## 2018-03-30 ENCOUNTER — Encounter (HOSPITAL_BASED_OUTPATIENT_CLINIC_OR_DEPARTMENT_OTHER): Payer: Self-pay | Admitting: *Deleted

## 2018-03-30 DIAGNOSIS — Z79899 Other long term (current) drug therapy: Secondary | ICD-10-CM | POA: Diagnosis not present

## 2018-03-30 DIAGNOSIS — R7989 Other specified abnormal findings of blood chemistry: Secondary | ICD-10-CM | POA: Diagnosis not present

## 2018-03-30 DIAGNOSIS — I10 Essential (primary) hypertension: Secondary | ICD-10-CM | POA: Insufficient documentation

## 2018-03-30 DIAGNOSIS — Z91040 Latex allergy status: Secondary | ICD-10-CM | POA: Insufficient documentation

## 2018-03-30 DIAGNOSIS — Z7982 Long term (current) use of aspirin: Secondary | ICD-10-CM | POA: Diagnosis not present

## 2018-03-30 DIAGNOSIS — R0602 Shortness of breath: Secondary | ICD-10-CM | POA: Diagnosis not present

## 2018-03-30 DIAGNOSIS — Z87891 Personal history of nicotine dependence: Secondary | ICD-10-CM | POA: Insufficient documentation

## 2018-03-30 DIAGNOSIS — Z7902 Long term (current) use of antithrombotics/antiplatelets: Secondary | ICD-10-CM | POA: Insufficient documentation

## 2018-03-30 DIAGNOSIS — I2510 Atherosclerotic heart disease of native coronary artery without angina pectoris: Secondary | ICD-10-CM | POA: Insufficient documentation

## 2018-03-30 LAB — CBC
HEMATOCRIT: 39.7 % (ref 36.0–46.0)
HEMOGLOBIN: 13.4 g/dL (ref 12.0–15.0)
MCH: 28.1 pg (ref 26.0–34.0)
MCHC: 33.8 g/dL (ref 30.0–36.0)
MCV: 83.2 fL (ref 78.0–100.0)
Platelets: 226 10*3/uL (ref 150–400)
RBC: 4.77 MIL/uL (ref 3.87–5.11)
RDW: 13.7 % (ref 11.5–15.5)
WBC: 6 10*3/uL (ref 4.0–10.5)

## 2018-03-30 LAB — BASIC METABOLIC PANEL
ANION GAP: 10 (ref 5–15)
BUN: 25 mg/dL — ABNORMAL HIGH (ref 6–20)
CALCIUM: 9.6 mg/dL (ref 8.9–10.3)
CO2: 24 mmol/L (ref 22–32)
Chloride: 101 mmol/L (ref 101–111)
Creatinine, Ser: 0.73 mg/dL (ref 0.44–1.00)
GFR calc Af Amer: >60 mL/min (ref >60)
Glucose, Bld: 112 mg/dL — ABNORMAL HIGH (ref 65–99)
POTASSIUM: 4 mmol/L (ref 3.5–5.1)
SODIUM: 135 mmol/L (ref 135–145)

## 2018-03-30 LAB — TROPONIN I

## 2018-03-30 LAB — D-DIMER, QUANTITATIVE (NOT AT ARMC): D DIMER QUANT: 1.38 ug{FEU}/mL — AB (ref 0.00–0.50)

## 2018-03-30 MED ORDER — DIPHENHYDRAMINE HCL 50 MG/ML IJ SOLN
25.0000 mg | Freq: Once | INTRAMUSCULAR | Status: AC
  Administered 2018-03-30: 25 mg via INTRAVENOUS
  Filled 2018-03-30: qty 1

## 2018-03-30 MED ORDER — METHYLPREDNISOLONE SODIUM SUCC 125 MG IJ SOLR
125.0000 mg | Freq: Once | INTRAMUSCULAR | Status: AC
  Administered 2018-03-30: 125 mg via INTRAVENOUS
  Filled 2018-03-30: qty 2

## 2018-03-30 NOTE — ED Notes (Signed)
ED Provider at bedside. 

## 2018-03-30 NOTE — ED Triage Notes (Signed)
Sob. States she could have anxiety. She has a hx of cardiac stents a month ago.

## 2018-03-30 NOTE — ED Provider Notes (Signed)
[place] EMERGENCY DEPARTMENT Provider Note   CSN: [id number] Arrival date & time: 03/30/18  1943     History   Chief Complaint Chief Complaint  Patient presents with  . Shortness of Breath    HPI Erika Brown is a 77 y.o. female.  The history is provided by the patient and medical records.  Shortness of Breath  This is a new problem. The average episode lasts 2 days. The problem occurs continuously.The current episode started yesterday. The problem has not changed since onset.Pertinent negatives include no fever, no headaches, no coryza, no neck pain, no cough, no sputum production, no wheezing, no chest pain, no syncope, no vomiting, no abdominal pain, no leg pain and no leg swelling. She has tried nothing for the symptoms. The treatment provided no relief. Associated medical issues include CAD and recent surgery.    Past Medical History:  Diagnosis Date  . Anxiety and depression   . CAD (coronary artery disease)    S/P NSTEMI 4/06 EF 60%; Treated with Cypher DES to LAD and RCA with kissing balloon PCI diagonal; Treadmill myoview 10/09 for CP: Walked 7:06. mild ST-T-wave changes in recovery. EF 71%. Very mild reversible defect in base of the inferior wall --> medical rx.  . Cancer ([place])    HISTORY OF SKIN CANCER  . Complication of anesthesia    " DIFFICULT TO WAKE UP "  . Depression   . Glucose intolerance (impaired glucose tolerance)   . Heart murmur   . History of echocardiogram    Echo 11/17: Vigorous LVF, EF 65-70, normal wall motion, borderline diastolic function, systolic bowing of mitral valve without prolapse, mild MR, trivial TR, PASP 20  . History of medication noncompliance   . Hyperlipidemia    Intolerate to multiple statins due to severe myalgias  . Hypertension    Possible white-coat component  . MI (myocardial infarction) ([place])    acute-s/p stent  . Microhematuria   . Psoriasis   . Rapid heart beat   . Vitamin D deficiency     Patient  Active Problem List   Diagnosis Date Noted  . Abnormal stress test 02/28/2018  . CAD (coronary artery disease)   . PVC's (premature ventricular contractions) 02/10/2018  . Palpitations 02/10/2018  . Pure hypercholesterolemia 01/07/2017  . SHOULDER PAIN, LEFT 02/11/2009  . Hyperlipidemia LDL goal <70 02/09/2009  . Essential hypertension 02/09/2009  . Coronary artery disease involving native coronary artery of native heart without angina pectoris 02/09/2009    Past Surgical History:  Procedure Laterality Date  . ABDOMINAL HYSTERECTOMY     and anterior repair  . APPENDECTOMY    . bladder tact    . BREAST BIOPSY    . CARDIAC CATHETERIZATION  02/28/2018  . CATARACT EXTRACTION Bilateral 2017   Dr. Katy Fitch  . CORONARY STENT INTERVENTION N/A 02/28/2018   Procedure: CORONARY STENT INTERVENTION;  Surgeon: Jettie Booze, MD;  Location: [facility];  Service: Cardiovascular;  Laterality: N/A;  . CORONARY STENT PLACEMENT    . CORONARY STENT PLACEMENT  04/02/019  . LEFT HEART CATH AND CORONARY ANGIOGRAPHY N/A 02/28/2018   Procedure: LEFT HEART CATH AND CORONARY ANGIOGRAPHY;  Surgeon: Jettie Booze, MD;  Location: [facility];  Service: Cardiovascular;  Laterality: N/A;  . MOHS SURGERY  9/14   [facility].  [facility]  . TONSILLECTOMY AND ADENOIDECTOMY    . ULTRASOUND GUIDANCE FOR VASCULAR ACCESS  02/28/2018   Procedure: Ultrasound Guidance For Vascular Access;  Surgeon: Jettie Booze, MD;  Location: [facility];  Service: Cardiovascular;;     OB History    Gravida  7   Para  7   Term      Preterm      AB      Living  6     SAB      TAB      Ectopic      Multiple      Live Births           Obstetric Comments  One daughter died of melanoma         Home Medications    Prior to Admission medications   Medication Sig Start Date End Date Taking? Authorizing Provider  amLODipine (NORVASC) 5 MG tablet TAKE 1 TABLET EACH DAY. 02/24/18   End,  Harrell Gave, MD  aspirin 81 MG EC tablet Take 81 mg by mouth daily.      [provider]  clopidogrel (PLAVIX) 75 MG tablet TAKE 1 TABLET ONCE DAILY. 02/24/18   End, Harrell Gave, MD  ergocalciferol (VITAMIN D2) 50000 UNITS capsule Take 50,000 Units by mouth every Friday.     [provider]  irbesartan-hydrochlorothiazide (AVALIDE) 300-12.5 MG tablet Take 1 tablet by mouth daily.    [provider]  meclizine (ANTIVERT) 25 MG tablet Take 12.5 mg by mouth 3 (three) times daily as needed for dizziness.     [provider]  metoprolol succinate (TOPROL-XL) 25 MG 24 hr tablet TAKE 1 TABLET ONCE DAILY. 02/24/18   End, Harrell Gave, MD  nitroGLYCERIN (NITROSTAT) 0.4 MG SL tablet Place 1 tablet (0.4 mg total) under the tongue every 5 (five) minutes as needed for chest pain. 03/01/18   Richardson Dopp T, PA-C  potassium chloride SA (K-DUR,KLOR-CON) 20 MEQ tablet Take 20 mEq by mouth daily.    [provider]    Family History Family History  Problem Relation Age of Onset  . Heart disease Father   . Heart attack Father   . Ulcers Father   . Hypertension Mother   . Melanoma Mother   . Cancer Daughter        melanoma  . Stroke Maternal Grandmother   . Stroke Maternal Grandfather   . Renal Disease Maternal Grandfather        kidney removed  . Diabetes Paternal Grandmother   . Melanoma Child        Metastatic  . Breast cancer Sister 26  . Hypertension Brother   . Asthma Brother   . Depression Daughter        x2  . Rectal cancer Son   . Coronary artery disease Neg Hx     Social History Social History   Tobacco Use  . Smoking status: Former Research scientist (life sciences)  . Smokeless tobacco: Never Used  Substance Use Topics  . Alcohol use: Yes    Alcohol/week: 2.4 oz    Types: 4 Glasses of wine per week    Comment: glass of wine at dinner most nights  . Drug use: No     Allergies   Iodine; Latex; Other; and Statins   Review of Systems Review of Systems    Constitutional: Negative for chills, fatigue and fever.  HENT: Negative for congestion.   Respiratory: Positive for shortness of breath. Negative for cough, sputum production, choking, chest tightness and wheezing.   Cardiovascular: Negative for chest pain, leg swelling and syncope.  Gastrointestinal: Negative for abdominal pain, constipation, diarrhea, nausea and vomiting.  Genitourinary: Negative for dysuria.  Musculoskeletal: Negative for back pain, neck pain and neck stiffness.  Neurological: Negative for light-headedness and headaches.  Psychiatric/Behavioral: Negative for agitation.  All other systems reviewed and are negative.    Physical Exam Updated Vital Signs Ht 5\' 4"  (1.626 m)   Wt 66.2 kg (146 lb)   LMP 11/29/1974   BMI 25.06 kg/m   Physical Exam  Constitutional: She is oriented to person, place, and time. She appears well-developed and well-nourished. No distress.  HENT:  Head: Normocephalic and atraumatic.  Right Ear: External ear normal.  Left Ear: External ear normal.  Nose: Nose normal.  Mouth/Throat: Oropharynx is clear and moist. No oropharyngeal exudate.  Eyes: Pupils are equal, round, and reactive to light. Conjunctivae and EOM are normal.  Neck: Normal range of motion. Neck supple.  Cardiovascular: Normal rate and intact distal pulses.  No murmur heard. Pulmonary/Chest: Effort normal and breath sounds normal. No stridor. No respiratory distress. She has no wheezes. She has no rales. She exhibits no tenderness.  Abdominal: She exhibits no distension. There is no tenderness. There is no rebound.  Musculoskeletal: She exhibits no edema or tenderness.  Neurological: She is alert and oriented to person, place, and time. She has normal reflexes. She exhibits normal muscle tone. Coordination normal.  Skin: Skin is warm. No rash noted. She is not diaphoretic. No erythema.  Nursing note and vitals reviewed.    ED Treatments / Results  Labs (all labs ordered  are listed, but only abnormal results are displayed) Labs Reviewed  BASIC METABOLIC PANEL - Abnormal; Notable for the following components:      Result Value   Glucose, Bld 112 (*)    BUN 25 (*)    All other components within normal limits  D-DIMER, QUANTITATIVE (NOT AT [facility]) - Abnormal; Notable for the following components:   D-Dimer, Quant 1.38 (*)    All other components within normal limits  CBC  TROPONIN I  TROPONIN I    EKG EKG Interpretation  Date/Time:  Thursday Mar 30 2018 19:48:25 EDT Ventricular Rate:  79 PR Interval:  172 QRS Duration: 74 QT Interval:  364 QTC Calculation: 417 R Axis:   78 Text Interpretation:  Normal sinus rhythm Normal ECG When compared to prior, no significant changes seen.  No STEMI Confirmed by Antony Blackbird [phone]) on 03/30/2018 10:00:00 PM   Radiology Dg Chest 2 View  Result Date: 03/30/2018 CLINICAL DATA:  Shortness of breath EXAM: CHEST - 2 VIEW COMPARISON:  Chest radiograph 03/06/2005 FINDINGS: Stable cardiac and mediastinal contours. No consolidative pulmonary opacities. No pleural effusion or pneumothorax. Left basilar linear opacities favored to represent atelectasis. Thoracic spine degenerative changes. IMPRESSION: No acute cardiopulmonary process. Electronically Signed   By: Lovey Newcomer M.D.   On: 03/30/2018 20:27   Ct Angio Chest Pe W And/or Wo Contrast  Result Date: 03/31/2018 CLINICAL DATA:  77 year old female with positive D-dimer. Concern for pulmonary embolism. EXAM: CT ANGIOGRAPHY CHEST WITH CONTRAST TECHNIQUE: Multidetector CT imaging of the chest was performed using the standard protocol during bolus administration of intravenous contrast. Multiplanar CT image reconstructions and MIPs were obtained to evaluate the vascular anatomy. CONTRAST:  187mL ISOVUE-370 IOPAMIDOL (ISOVUE-370) INJECTION 76% COMPARISON:  Chest radiograph dated 03/30/2018 FINDINGS: Cardiovascular: There is no cardiomegaly or pericardial effusion. Coronary  vascular calcification and stents noted. There is mild atherosclerotic calcification of the thoracic aorta. No aneurysmal dilatation or evidence of dissection. The origins of the great vessels of the aortic arch appear  patent. There is no CT evidence of pulmonary embolism. Mediastinum/Nodes: No hilar or mediastinal adenopathy. There is a small hiatal hernia. The esophagus is otherwise grossly unremarkable. No mediastinal fluid collection. Lungs/Pleura: Minimal bibasilar atelectatic changes. There is no focal consolidation, pleural effusion, or pneumothorax. The central airways are patent. Upper Abdomen: Multiple hepatic hypodense lesions measure up to 2.5 cm in the left lobe of the liver, incompletely characterized but most likely cysts. The visualized upper abdomen is otherwise unremarkable. Musculoskeletal: No chest wall abnormality. No acute or significant osseous findings. Review of the MIP images confirms the above findings. IMPRESSION: 1. No acute intrathoracic pathology. No CT evidence of pulmonary embolism. 2. Coronary vascular calcification and stents. 3. Small hiatal hernia. Electronically Signed   By: Anner Crete M.D.   On: 03/31/2018 01:20    Procedures Procedures (including critical care time)  Medications Ordered in ED Medications  methylPREDNISolone sodium succinate (SOLU-MEDROL) 125 mg/2 mL injection 125 mg (125 mg Intravenous Given 03/30/18 2344)  diphenhydrAMINE (BENADRYL) injection 25 mg (25 mg Intravenous Given 03/30/18 2344)  iopamidol (ISOVUE-370) 76 % injection 100 mL (100 mLs Intravenous Contrast Given 03/31/18 0052)     Initial Impression / Assessment and Plan / ED Course  I have reviewed the triage vital signs and the nursing notes.  Pertinent labs & imaging results that were available during my care of the patient were reviewed by me and considered in my medical decision making (see chart for details).     Erika Brown is a 77 y.o. female with a past medical history  significant for hyperlipidemia, hypertension, and CAD with PCI times three 1 month ago who presents with shortness of breath.  Patient reports that today she noticed she was having increased work of breathing and shortness of breath.  She denies any chest pain or palpitations.  She denies any syncope or lightheadedness.  She reports that she simply is breathing very quickly and cannot control it.  She reports that she is short of breath but denies any cough or congestion.  She denies any fevers but does report a mild chill.  No urinary symptoms or GI symptoms.  No diaphoresis, nausea, vomiting, or recent trauma.  She does feel somewhat anxious but is concerned about her breathing.  On exam, patient is tachypneic.  Patient was breathing in the  upper 20s when I evaluated her.  Patient was not tachycardic.  She was on room air.  Patient's lungs were clear in all lung fields.  Chest was nontender.  No murmurs appreciated.  Patient had no significant lower extremity tenderness or swelling.  Patient's EKG showed a sinus rhythm.  No STEMI.  Patient had work-up including troponin and chest x-ray.  Chest x-ray reassuring.  D-dimer was ordered given the recent procedure to try and rule out PE without having to do imaging with her iodine allergy.  Unfortunately, d-dimer was elevated at 1.38.  A shared decision making conversation was held about imaging and contrast.  Patient reports that she had no complication with the contrast dye used during her heart catheterization 1 month ago but she was given a dose of Solu-Medrol Benadryl just prior to the procedure.  Radiology was called who recommended a 4-hour pretreatment plan however when patient was given this plan she refused.  She would much rather do the immediate pretreatment with 125 Solu-Medrol and 25 of Benadryl.  The shared decision making conversation led to the patient understanding the risks of anaphylaxis or complication with the nonstandard protocol  that  was used last month however patient still wishes to continue with this for the PE study.  Orders will be placed for the patient to get her PE study after the pretreatment.  Anticipate following up on imaging to determine if she has a pulmonary embolism.  Care transferred to Dr. Florina Ou while awaiting CT and deltra trop.    Final Clinical Impressions(s) / ED Diagnoses   Final diagnoses:  SOB (shortness of breath)    ED Discharge Orders    None      Clinical Impression: 1. SOB (shortness of breath)     Disposition: Care transferred to Dr. Florina Ou while awaiting CT results    Goerge Mohr, Gwenyth Allegra, MD 03/31/18 [id number]

## 2018-03-31 DIAGNOSIS — R7989 Other specified abnormal findings of blood chemistry: Secondary | ICD-10-CM | POA: Diagnosis not present

## 2018-03-31 LAB — TROPONIN I: Troponin I: <0.03 ng/mL (ref <0.03)

## 2018-03-31 MED ORDER — IOPAMIDOL (ISOVUE-370) INJECTION 76%
100.0000 mL | Freq: Once | INTRAVENOUS | Status: AC | PRN
  Administered 2018-03-31: 100 mL via INTRAVENOUS

## 2018-03-31 NOTE — ED Provider Notes (Signed)
Nursing notes and vitals signs, including pulse oximetry, reviewed.  Summary of this visit's results, reviewed by myself:  EKG:  EKG Interpretation  Date/Time:  Thursday Mar 30 2018 19:48:25 EDT Ventricular Rate:  79 PR Interval:  172 QRS Duration: 74 QT Interval:  364 QTC Calculation: 417 R Axis:   78 Text Interpretation:  Normal sinus rhythm Normal ECG When compared to prior, no significant changes seen.  No STEMI Confirmed by Tegeler, Gerald Stabs [phone]) on 03/30/2018 10:00:00 PM       Labs:  Results for orders placed or performed during the hospital encounter of 03/30/18 (from the past 24 hour(s))  Basic metabolic panel     Status: Abnormal   Collection Time: 03/30/18  7:56 PM  Result Value Ref Range   Sodium 135 135 - 145 mmol/L   Potassium 4.0 3.5 - 5.1 mmol/L   Chloride 101 101 - 111 mmol/L   CO2 24 22 - 32 mmol/L   Glucose, Bld 112 (H) 65 - 99 mg/dL   BUN 25 (H) 6 - 20 mg/dL   Creatinine, Ser 0.73 0.44 - 1.00 mg/dL   Calcium 9.6 8.9 - 10.3 mg/dL   GFR calc non Af Amer >60 >60 mL/min   GFR calc Af Amer >60 >60 mL/min   Anion gap 10 5 - 15  CBC     Status: None   Collection Time: 03/30/18  7:56 PM  Result Value Ref Range   WBC 6.0 4.0 - 10.5 K/uL   RBC 4.77 3.87 - 5.11 MIL/uL   Hemoglobin 13.4 12.0 - 15.0 g/dL   HCT 39.7 36.0 - 46.0 %   MCV 83.2 78.0 - 100.0 fL   MCH 28.1 26.0 - 34.0 pg   MCHC 33.8 30.0 - 36.0 g/dL   RDW 13.7 11.5 - 15.5 %   Platelets 226 150 - 400 K/uL  Troponin I     Status: None   Collection Time: 03/30/18  7:56 PM  Result Value Ref Range   Troponin I <0.03 <0.03 ng/mL  D-dimer, quantitative (not at [facility])     Status: Abnormal   Collection Time: 03/30/18  7:56 PM  Result Value Ref Range   D-Dimer, Quant 1.38 (H) 0.00 - 0.50 ug/mL-FEU  Troponin I     Status: None   Collection Time: 03/31/18  1:25 AM  Result Value Ref Range   Troponin I <0.03 <0.03 ng/mL    Imaging Studies: Dg Chest 2 View  Result Date: 03/30/2018 CLINICAL DATA:  Shortness  of breath EXAM: CHEST - 2 VIEW COMPARISON:  Chest radiograph 03/06/2005 FINDINGS: Stable cardiac and mediastinal contours. No consolidative pulmonary opacities. No pleural effusion or pneumothorax. Left basilar linear opacities favored to represent atelectasis. Thoracic spine degenerative changes. IMPRESSION: No acute cardiopulmonary process. Electronically Signed   By: Lovey Newcomer M.D.   On: 03/30/2018 20:27   Ct Angio Chest Pe W And/or Wo Contrast  Result Date: 03/31/2018 CLINICAL DATA:  77 year old female with positive D-dimer. Concern for pulmonary embolism. EXAM: CT ANGIOGRAPHY CHEST WITH CONTRAST TECHNIQUE: Multidetector CT imaging of the chest was performed using the standard protocol during bolus administration of intravenous contrast. Multiplanar CT image reconstructions and MIPs were obtained to evaluate the vascular anatomy. CONTRAST:  160mL ISOVUE-370 IOPAMIDOL (ISOVUE-370) INJECTION 76% COMPARISON:  Chest radiograph dated 03/30/2018 FINDINGS: Cardiovascular: There is no cardiomegaly or pericardial effusion. Coronary vascular calcification and stents noted. There is mild atherosclerotic calcification of the thoracic aorta. No aneurysmal dilatation or evidence of dissection. The origins of  the great vessels of the aortic arch appear patent. There is no CT evidence of pulmonary embolism. Mediastinum/Nodes: No hilar or mediastinal adenopathy. There is a small hiatal hernia. The esophagus is otherwise grossly unremarkable. No mediastinal fluid collection. Lungs/Pleura: Minimal bibasilar atelectatic changes. There is no focal consolidation, pleural effusion, or pneumothorax. The central airways are patent. Upper Abdomen: Multiple hepatic hypodense lesions measure up to 2.5 cm in the left lobe of the liver, incompletely characterized but most likely cysts. The visualized upper abdomen is otherwise unremarkable. Musculoskeletal: No chest wall abnormality. No acute or significant osseous findings. Review of  the MIP images confirms the above findings. IMPRESSION: 1. No acute intrathoracic pathology. No CT evidence of pulmonary embolism. 2. Coronary vascular calcification and stents. 3. Small hiatal hernia. Electronically Signed   By: Anner Crete M.D.   On: 03/31/2018 01:20      Pike Scantlebury, Jenny Reichmann, MD 03/31/18 0222

## 2018-04-18 ENCOUNTER — Telehealth (HOSPITAL_COMMUNITY): Payer: Self-pay | Admitting: *Deleted

## 2018-04-18 NOTE — Telephone Encounter (Signed)
Pt called cardiac rehab department.  Pt would like to cancel her upcoming appointment for cardiac rehab on 6/20.  Pt is having a leak in her kitchen repaired and the work is taking longer than expected to complete.  Pt needs to be home for the repair work and to care for her husband.  Pt   can not commit to exercise at this time.  Pt will call back to schedule when she is ready. Cherre Huger, BSN Cardiac and Training and development officer

## 2018-05-18 ENCOUNTER — Ambulatory Visit (HOSPITAL_COMMUNITY): Payer: PPO

## 2018-05-22 ENCOUNTER — Ambulatory Visit (HOSPITAL_COMMUNITY): Payer: PPO

## 2018-05-24 ENCOUNTER — Ambulatory Visit (HOSPITAL_COMMUNITY): Payer: PPO

## 2018-05-26 ENCOUNTER — Ambulatory Visit (HOSPITAL_COMMUNITY): Payer: PPO

## 2018-05-29 ENCOUNTER — Ambulatory Visit (HOSPITAL_COMMUNITY): Payer: PPO

## 2018-05-31 ENCOUNTER — Ambulatory Visit (HOSPITAL_COMMUNITY): Payer: PPO

## 2018-06-02 ENCOUNTER — Ambulatory Visit (HOSPITAL_COMMUNITY): Payer: PPO

## 2018-06-05 ENCOUNTER — Ambulatory Visit (HOSPITAL_COMMUNITY): Payer: PPO

## 2018-06-06 ENCOUNTER — Telehealth: Payer: Self-pay | Admitting: Internal Medicine

## 2018-06-06 NOTE — Telephone Encounter (Signed)
New Message    Pt c/o BP issue:  1. What are your last 5 BP readings? 153/88, 158/80, 140/84,  2. Are you having any other symptoms (ex. Dizziness, headache, blurred vision, passed out)? Slight dull headaches  3. What is your medication issue? No    STAT if HR is under 50 or over 120 (normal HR is 60-100 beats per minute)  1) What is your heart rate? This morning 87   2) Do you have a log of your heart rate readings (document readings)? 78, 98   Do you have any other symptoms? Slight dull headache

## 2018-06-06 NOTE — Telephone Encounter (Signed)
I agree with plan, as documented below.  Patient should monitor blood pressure and symptoms.  If they do not improve, she should contact her PCP for further evaluation/management.  We will follow-up later this month as scheduled.  Nelva Bush, MD [facility] Pager: [phone]

## 2018-06-06 NOTE — Telephone Encounter (Signed)
Called patient back with Dr. Darnelle Bos recommendations. Patient verbalized understanding.

## 2018-06-06 NOTE — Telephone Encounter (Signed)
Patient calling complaining of palpations, elevated BP, and dull headaches. Patient's SBP has been 130's to 160's. Patient's HR has been 70's to 90's. Patient stated this has been going on for about 3 weeks or more. Patient stated she has been under a lot of stress with taking care of a husband with dementia and had to unexpectedly have her kitchen fixed/repaired, leaving her with no sink in the kitchen. Suggested patient to talk to her PCP or a professional about the stressors in her life. Patient denies any chest pain or SOB. Informed patient that her stressors are probably causing her BP to go up. Encouraged patient to keep a low salt diet to help with BP. Patient has follow up with Dr. Saunders Revel on 06/19/18. Will forward to Dr. Saunders Revel for advisement.

## 2018-06-07 ENCOUNTER — Ambulatory Visit (HOSPITAL_COMMUNITY): Payer: PPO

## 2018-06-09 ENCOUNTER — Ambulatory Visit (HOSPITAL_COMMUNITY): Payer: PPO

## 2018-06-12 ENCOUNTER — Ambulatory Visit (HOSPITAL_COMMUNITY): Payer: PPO

## 2018-06-14 ENCOUNTER — Ambulatory Visit (HOSPITAL_COMMUNITY): Payer: PPO

## 2018-06-15 DIAGNOSIS — Z85828 Personal history of other malignant neoplasm of skin: Secondary | ICD-10-CM | POA: Diagnosis not present

## 2018-06-15 DIAGNOSIS — D485 Neoplasm of uncertain behavior of skin: Secondary | ICD-10-CM | POA: Diagnosis not present

## 2018-06-15 DIAGNOSIS — D2261 Melanocytic nevi of right upper limb, including shoulder: Secondary | ICD-10-CM | POA: Diagnosis not present

## 2018-06-15 DIAGNOSIS — D1801 Hemangioma of skin and subcutaneous tissue: Secondary | ICD-10-CM | POA: Diagnosis not present

## 2018-06-15 DIAGNOSIS — D2271 Melanocytic nevi of right lower limb, including hip: Secondary | ICD-10-CM | POA: Diagnosis not present

## 2018-06-15 DIAGNOSIS — L821 Other seborrheic keratosis: Secondary | ICD-10-CM | POA: Diagnosis not present

## 2018-06-15 DIAGNOSIS — D2272 Melanocytic nevi of left lower limb, including hip: Secondary | ICD-10-CM | POA: Diagnosis not present

## 2018-06-15 DIAGNOSIS — D225 Melanocytic nevi of trunk: Secondary | ICD-10-CM | POA: Diagnosis not present

## 2018-06-15 DIAGNOSIS — L570 Actinic keratosis: Secondary | ICD-10-CM | POA: Diagnosis not present

## 2018-06-15 DIAGNOSIS — L814 Other melanin hyperpigmentation: Secondary | ICD-10-CM | POA: Diagnosis not present

## 2018-06-16 ENCOUNTER — Ambulatory Visit (HOSPITAL_COMMUNITY): Payer: PPO

## 2018-06-18 NOTE — Progress Notes (Signed)
Follow-up Outpatient Visit Date: 06/19/2018  Primary Care Provider: Burnard Bunting, MD [place] [address]  Chief Complaint: Follow-up coronary artery disease and palpitations  HPI:  Ms. Cirrincione is a 77 y.o. year-old female with history of CAD s/p remote PCIs to the LAD, RCA, and diagonal (2006), HTN, HLD, and palpitations, who presents for follow-up of coronary artery disease.  I saw her in mid March, at which time she complained of palpitations and jaw discomfort.  We elected to perform a myocardial perfusions stress test due to PVC's noted on EKG, which showed inferior and inferolateral ischemia.  She was found to have significant 3-vessel coronary artery disease.  She was adamant that she did not wish to undergo CABG and received DES x 3 by Dr. Irish Lack.  She was doing well at follow-up visit in early April, though she developed an area of swelling overlying the radial arteriotomy site almost 1 week after the procedure.  Ultrasound showed a small forearm hematoma but no pseudoaneurysm.  Today, Ms. Cayson reports feeling well except for occasional palpitations.  She often wakes up at night feeling anxious and feels a "pulsating" sensation on the left lower portion of her chest.  This sometimes lasts a few hours before resolving spontaneously.  There are no associated symptoms.  She does not feel this with activity.  She wonders if it could be related to certain foods or salt.  She is feeling less irregular heartbeats compared to before her PCI.  She sometimes checks her blood pressure and notes that the blood pressure cuff indicates an irregular heartbeat.  Pain and swelling noted at the right radial arteriotomy site at her last visit have resolved.  Ms. Schwalm has not had any chest pain, shortness of breath, lightheadedness, or edema.  She is tolerating dual antiplatelet therapy well without significant bleeding.  She notes that her blood pressure has been running a bit high at  home and wonders if it could be related to the manufacturer of her generic irbesartan-HCTZ.  She is not on statin therapy due to intolerance to multiple agents.  She has researched PCSK9 inhibitors on her own and is also hesitant to try one of these.  --------------------------------------------------------------------------------------------------  Past Medical History:  Diagnosis Date  . Anxiety and depression   . CAD (coronary artery disease)    S/P NSTEMI 4/06 EF 60%; Treated with Cypher DES to LAD and RCA with kissing balloon PCI diagonal; Treadmill myoview 10/09 for CP: Walked 7:06. mild ST-T-wave changes in recovery. EF 71%. Very mild reversible defect in base of the inferior wall --> medical rx.  . Cancer ([facility])    HISTORY OF SKIN CANCER  . Complication of anesthesia    " DIFFICULT TO WAKE UP "  . Depression   . Glucose intolerance (impaired glucose tolerance)   . Heart murmur   . History of echocardiogram    Echo 11/17: Vigorous LVF, EF 65-70, normal wall motion, borderline diastolic function, systolic bowing of mitral valve without prolapse, mild MR, trivial TR, PASP 20  . History of medication noncompliance   . Hyperlipidemia    Intolerate to multiple statins due to severe myalgias  . Hypertension    Possible white-coat component  . MI (myocardial infarction) ([facility])    acute-s/p stent  . Microhematuria   . Psoriasis   . Rapid heart beat   . Vitamin D deficiency    Past Surgical History:  Procedure Laterality Date  . ABDOMINAL HYSTERECTOMY  and anterior repair  . APPENDECTOMY    . bladder tact    . BREAST BIOPSY    . CARDIAC CATHETERIZATION  02/28/2018  . CATARACT EXTRACTION Bilateral 2017   Dr. Katy Fitch  . CORONARY STENT INTERVENTION N/A 02/28/2018   Procedure: CORONARY STENT INTERVENTION;  Surgeon: Jettie Booze, MD;  Location: [facility];  Service: Cardiovascular;  Laterality: N/A;  . CORONARY STENT PLACEMENT    . CORONARY STENT PLACEMENT  04/02/019    . LEFT HEART CATH AND CORONARY ANGIOGRAPHY N/A 02/28/2018   Procedure: LEFT HEART CATH AND CORONARY ANGIOGRAPHY;  Surgeon: Jettie Booze, MD;  Location: [facility];  Service: Cardiovascular;  Laterality: N/A;  . MOHS SURGERY  9/14   [facility].  [facility]  . TONSILLECTOMY AND ADENOIDECTOMY    . ULTRASOUND GUIDANCE FOR VASCULAR ACCESS  02/28/2018   Procedure: Ultrasound Guidance For Vascular Access;  Surgeon: Jettie Booze, MD;  Location: [facility];  Service: Cardiovascular;;    Current Meds  Medication Sig  . amLODipine (NORVASC) 5 MG tablet TAKE 1 TABLET EACH DAY.  [name] aspirin 81 MG EC tablet Take 81 mg by mouth daily.    . clopidogrel (PLAVIX) 75 MG tablet TAKE 1 TABLET ONCE DAILY.  . ergocalciferol (VITAMIN D2) 50000 UNITS capsule Take 50,000 Units by mouth every Friday.   . irbesartan-hydrochlorothiazide (AVALIDE) 300-12.5 MG tablet Take 1 tablet by mouth daily.  . meclizine (ANTIVERT) 25 MG tablet Take 12.5 mg by mouth 3 (three) times daily as needed for dizziness.   . metoprolol succinate (TOPROL-XL) 25 MG 24 hr tablet TAKE 1 TABLET ONCE DAILY.  . nitroGLYCERIN (NITROSTAT) 0.4 MG SL tablet Place 1 tablet (0.4 mg total) under the tongue every 5 (five) minutes as needed for chest pain.  . potassium chloride SA (K-DUR,KLOR-CON) 20 MEQ tablet Take 20 mEq by mouth daily.    Allergies: Iodine; Latex; Other; and Statins  Social History   Tobacco Use  . Smoking status: Former Research scientist (life sciences)  . Smokeless tobacco: Never Used  Substance Use Topics  . Alcohol use: Yes    Alcohol/week: 2.4 oz    Types: 4 Glasses of wine per week    Comment: glass of wine at dinner most nights  . Drug use: No    Family History  Problem Relation Age of Onset  . Heart disease Father   . Heart attack Father   . Ulcers Father   . Hypertension Mother   . Melanoma Mother   . Cancer Daughter        melanoma  . Stroke Maternal Grandmother   . Stroke Maternal Grandfather   . Renal Disease  Maternal Grandfather        kidney removed  . Diabetes Paternal Grandmother   . Melanoma Child        Metastatic  . Breast cancer Sister 73  . Hypertension Brother   . Asthma Brother   . Depression Daughter        x2  . Rectal cancer Son   . Coronary artery disease Neg Hx     Review of Systems: A 12-system review of systems was performed and was negative except as noted in the HPI.  --------------------------------------------------------------------------------------------------  Physical Exam: BP 120/80   Pulse 98   Ht 5\' 4"  (1.626 m)   Wt 142 lb 12.8 oz (64.8 kg)   LMP 11/29/1974   SpO2 98%   BMI 24.51 kg/m   General: NAD. HEENT: No conjunctival  pallor or scleral icterus. Moist mucous membranes.  OP clear. Neck: Supple without lymphadenopathy, thyromegaly, JVD, or HJR. Lungs: Normal work of breathing. Clear to auscultation bilaterally without wheezes or crackles. Heart: Regular rate and rhythm without murmurs, rubs, or gallops. Non-displaced PMI. Abd: Bowel sounds present. Soft, NT/ND without hepatosplenomegaly Ext: No lower extremity edema. Radial, PT, and DP pulses are 2+ bilaterally. Skin: Warm and dry without rash.  EKG: Normal sinus rhythm with frequent PACs.  Low voltage QRS.  Otherwise, no significant abnormalities.  Lab Results  Component Value Date   WBC 6.0 03/30/2018   HGB 13.4 03/30/2018   HCT 39.7 03/30/2018   MCV 83.2 03/30/2018   PLT 226 03/30/2018    Lab Results  Component Value Date   NA 135 03/30/2018   K 4.0 03/30/2018   CL 101 03/30/2018   CO2 24 03/30/2018   BUN 25 (H) 03/30/2018   CREATININE 0.73 03/30/2018   GLUCOSE 112 (H) 03/30/2018   ALT 23 08/31/2016    Lab Results  Component Value Date   CHOL 235 (H) 03/02/2016   HDL 79 03/02/2016   LDLCALC 142 (H) 03/02/2016   LDLDIRECT 121.7 09/08/2007   TRIG 72 03/02/2016   CHOLHDL 3.0 03/02/2016     --------------------------------------------------------------------------------------------------  ASSESSMENT AND PLAN: Coronary artery disease without angina Ms. Illingworth does not report any symptoms to suggest worsening coronary insufficiency.  She is status post three-vessel PCI in March.  She will need to complete at least 6 months of dual antiplatelet therapy, though given her multivessel stenting, I would advocate for indefinite DAPT, as tolerated.  Hypertension Diastolic blood pressure upper normal today.  We will increase metoprolol succinate to 50 mg daily.  Hyperlipidemia LDL suboptimal in the setting of not taking any lipid-lowering therapy.  Ms. Heidel has been intolerant of multiple statins and does not wish to retry one.  She was previously given information about PCSK9 inhibitors but is concerned about these medications as well after doing research on her own.  We discussed whether or not ezetimibe would be an option, though she wishes to research this on her own before committing.  We discussed the importance of lipid therapy for secondary prevention of CAD.  Palpitations Improved following PCI, though Ms. Blumenthal still notes occasional "pulsating" sensation along the left side of her chest at night.  PACs noted on EKG today, which could explain this.  We will increase metoprolol succinate to 50 mg daily.  Follow-up: Return to clinic in 3 months to be seen by Richardson Dopp, PA.  Given my transition to [facility], Ms. Brotzman would like to follow with Dr. Irish Lack long-term.  Nelva Bush, MD 06/19/2018 9:51 AM

## 2018-06-19 ENCOUNTER — Ambulatory Visit: Payer: PPO | Admitting: Internal Medicine

## 2018-06-19 ENCOUNTER — Ambulatory Visit (HOSPITAL_COMMUNITY): Payer: PPO

## 2018-06-19 ENCOUNTER — Encounter: Payer: Self-pay | Admitting: Internal Medicine

## 2018-06-19 DIAGNOSIS — R002 Palpitations: Secondary | ICD-10-CM | POA: Diagnosis not present

## 2018-06-19 MED ORDER — METOPROLOL SUCCINATE ER 50 MG PO TB24: 50.0000 mg | ORAL_TABLET | Freq: Every day | ORAL | 5 refills | Status: DC

## 2018-06-19 NOTE — Patient Instructions (Addendum)
Medication Instructions:   START TAKING  METOPROLOL SUCCINATE 50 MG ONCE A DAY    If you need a refill on your cardiac medications before your next appointment, please call your pharmacy.  Labwork: NONE ORDERED  TODAY    Testing/Procedures: NONE ORDERED  TODAY    Follow-Up: IN 3 MONTHS WITH SCOTT WEAVER    Any Other Special Instructions Will Be Listed Below (If Applicable).

## 2018-06-21 ENCOUNTER — Ambulatory Visit (HOSPITAL_COMMUNITY): Payer: PPO

## 2018-06-23 ENCOUNTER — Ambulatory Visit (HOSPITAL_COMMUNITY): Payer: PPO

## 2018-06-26 ENCOUNTER — Ambulatory Visit (HOSPITAL_COMMUNITY): Payer: PPO

## 2018-06-28 ENCOUNTER — Ambulatory Visit (HOSPITAL_COMMUNITY): Payer: PPO

## 2018-06-30 ENCOUNTER — Ambulatory Visit (HOSPITAL_COMMUNITY): Payer: PPO

## 2018-07-03 ENCOUNTER — Ambulatory Visit (HOSPITAL_COMMUNITY): Payer: PPO

## 2018-07-05 ENCOUNTER — Ambulatory Visit (HOSPITAL_COMMUNITY): Payer: PPO

## 2018-07-07 ENCOUNTER — Ambulatory Visit (HOSPITAL_COMMUNITY): Payer: PPO

## 2018-07-10 ENCOUNTER — Ambulatory Visit (HOSPITAL_COMMUNITY): Payer: PPO

## 2018-07-11 ENCOUNTER — Other Ambulatory Visit: Payer: Self-pay | Admitting: Physician Assistant

## 2018-07-12 ENCOUNTER — Ambulatory Visit (HOSPITAL_COMMUNITY): Payer: PPO

## 2018-07-14 ENCOUNTER — Ambulatory Visit (HOSPITAL_COMMUNITY): Payer: PPO

## 2018-07-17 ENCOUNTER — Ambulatory Visit (HOSPITAL_COMMUNITY): Payer: PPO

## 2018-07-19 ENCOUNTER — Ambulatory Visit (HOSPITAL_COMMUNITY): Payer: PPO

## 2018-07-21 ENCOUNTER — Ambulatory Visit (HOSPITAL_COMMUNITY): Payer: PPO

## 2018-07-24 ENCOUNTER — Ambulatory Visit (HOSPITAL_COMMUNITY): Payer: PPO

## 2018-07-26 ENCOUNTER — Ambulatory Visit (HOSPITAL_COMMUNITY): Payer: PPO

## 2018-07-28 ENCOUNTER — Ambulatory Visit (HOSPITAL_COMMUNITY): Payer: PPO

## 2018-07-31 IMAGING — CT CT ANGIO CHEST
2 of 7 series · 19 of 36 positions shown · IV contrast (iopamidol)
Comparison: Chest radiograph dated 03/30/2018

CLINICAL DATA: 77-year-old female with positive D-dimer. Concern
for pulmonary embolism.

EXAM:
CT ANGIOGRAPHY CHEST WITH CONTRAST
TECHNIQUE: Multidetector CT imaging of the chest was performed using the
standard protocol during bolus administration of intravenous
contrast. Multiplanar CT image reconstructions and MIPs were
obtained to evaluate the vascular anatomy.
CONTRAST:  100mL [id number] IOPAMIDOL (BMN81S-AT7) INJECTION 76%

[Series 6: pe thins · axial · 0.66mm/px · z∈[-324,-33]mm · 18 of 325 slices shown]
[im 17/325  lung]
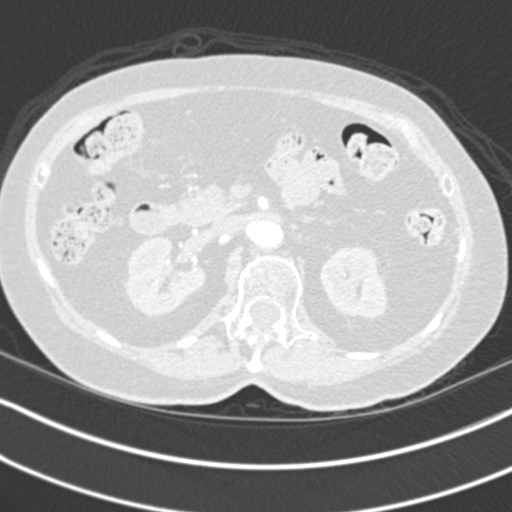
[im 33/325  mediastinal]
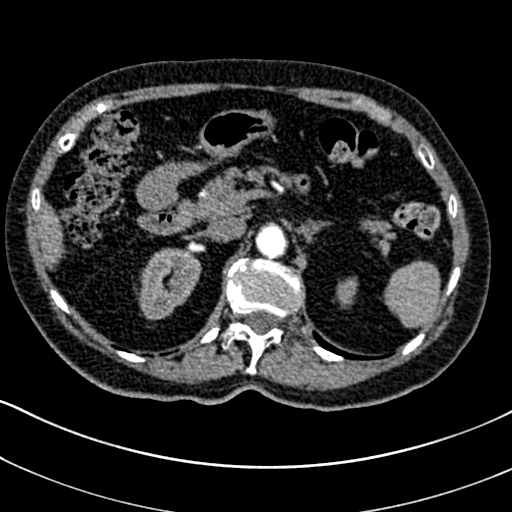
[im 49/325  lung]
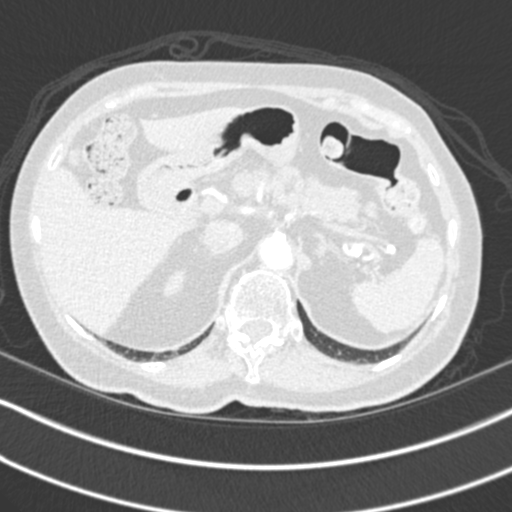
[im 65/325  mediastinal]
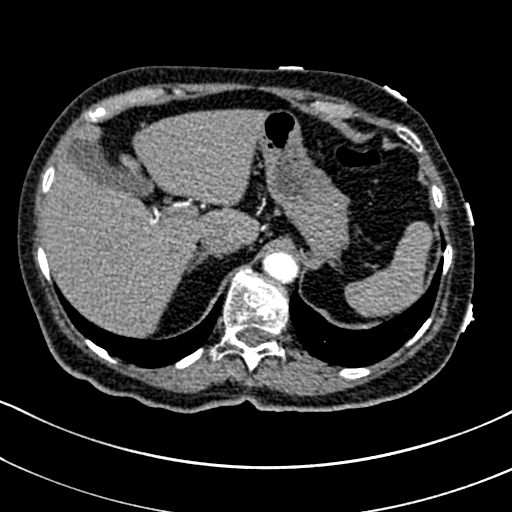
[im 82/325  lung]
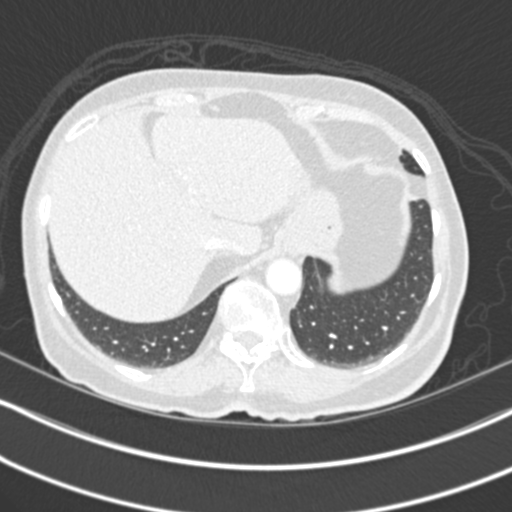
[im 98/325  mediastinal]
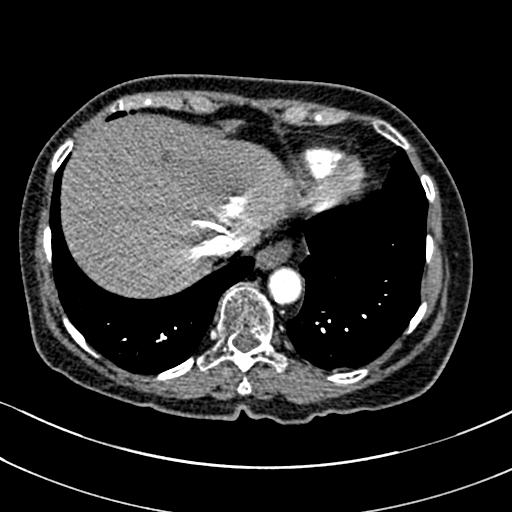
[im 114/325  lung]
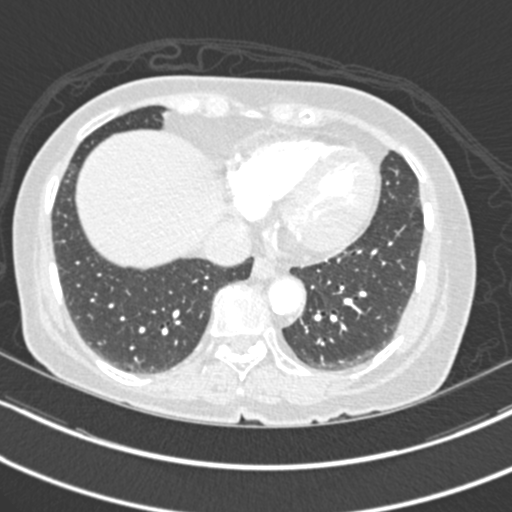
[im 130/325  mediastinal]
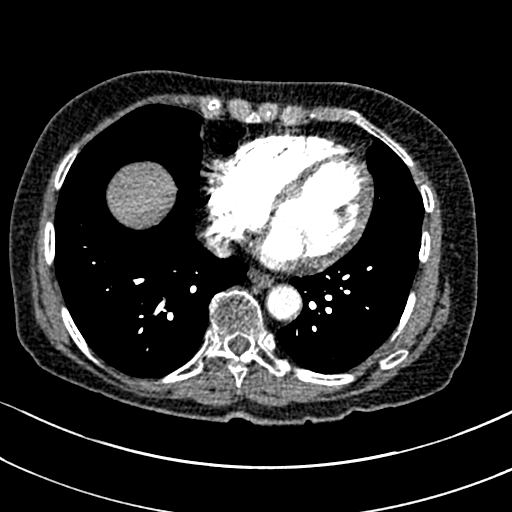
[im 146/325  lung]
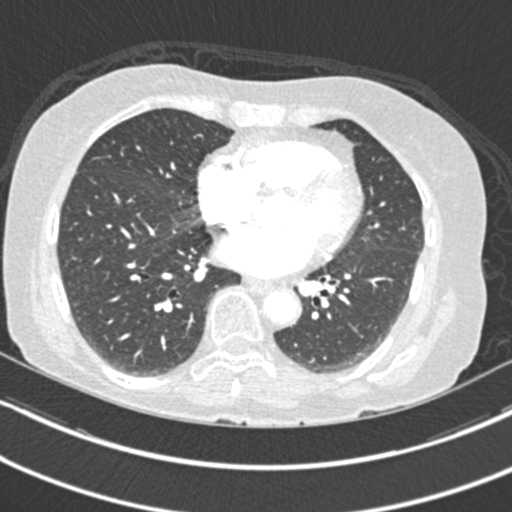
[im 179/325  mediastinal]
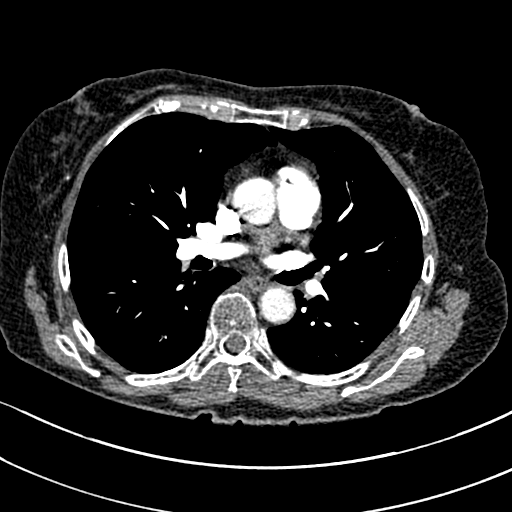
[im 195/325  lung]
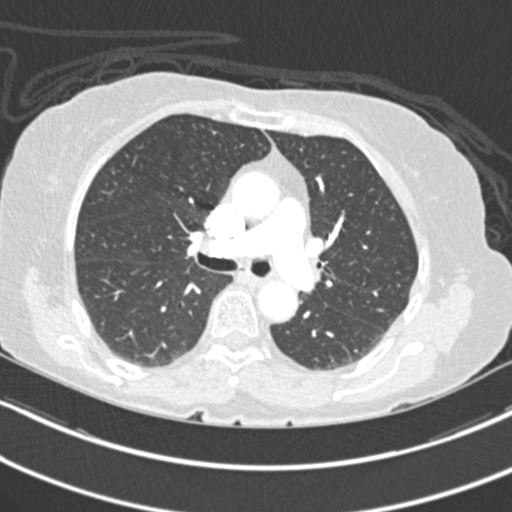
[im 211/325  mediastinal]
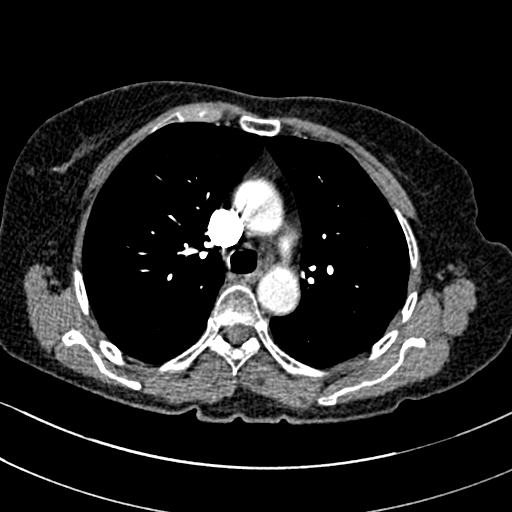
[im 227/325  lung]
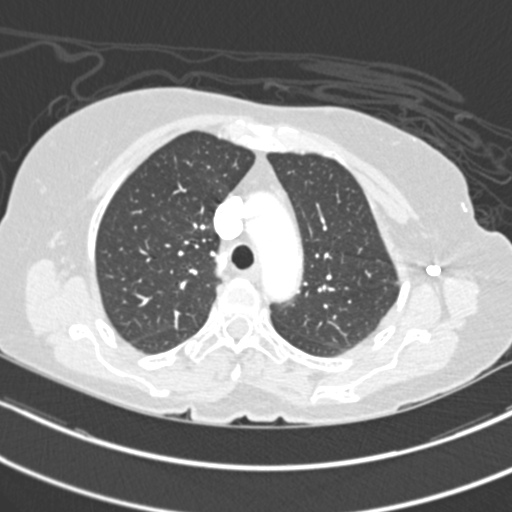
[im 244/325  mediastinal]
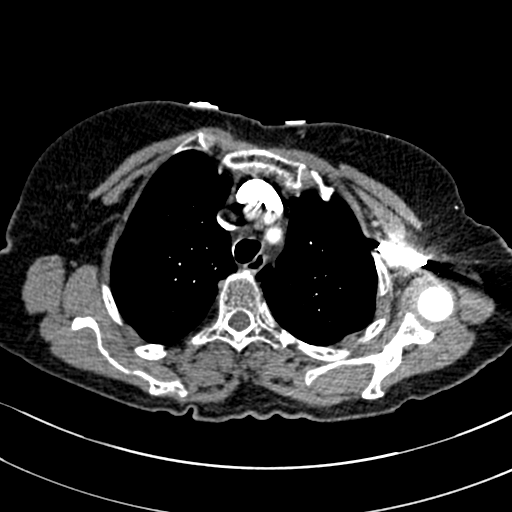
[im 260/325  lung]
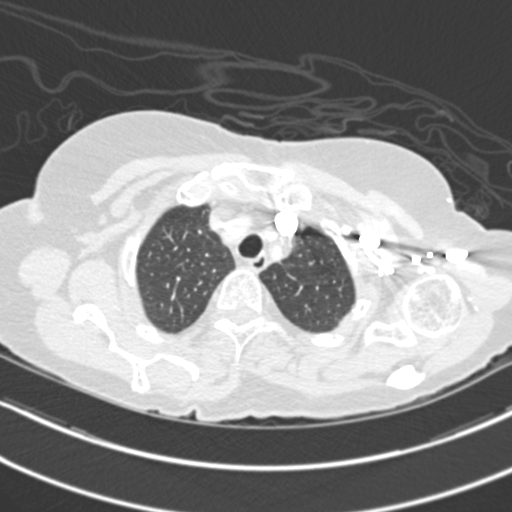
[im 276/325  mediastinal]
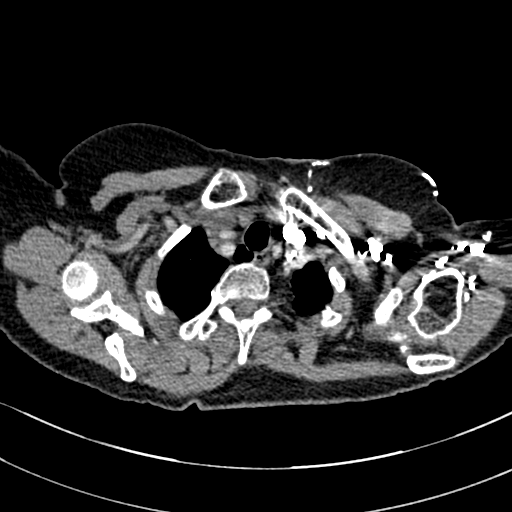
[im 292/325  lung]
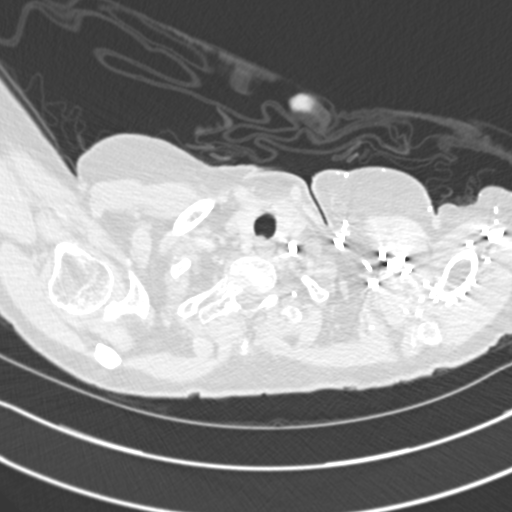
[im 308/325  mediastinal]
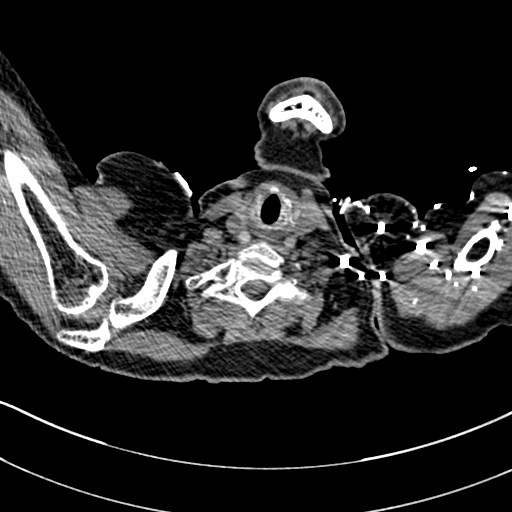

[Series 7: pe coronal mpr · coronal · 0.63mm/px · 1 of 114 slices shown]
[im 57/114  mediastinal]
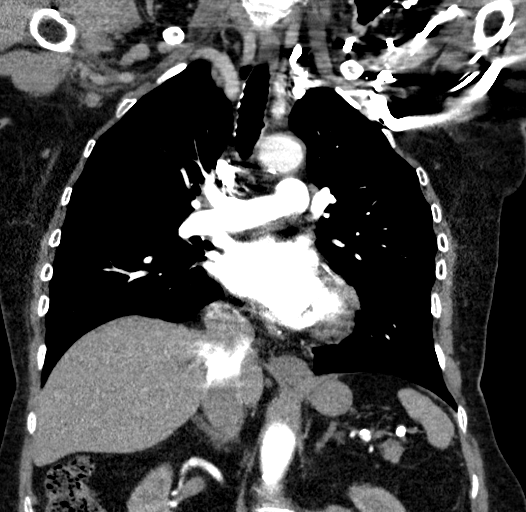

[19 of 36 positions shown; findings below may reference images not displayed]

FINDINGS: Cardiovascular: There is no cardiomegaly or pericardial effusion.
Coronary vascular calcification and stents noted. There is mild
atherosclerotic calcification of the thoracic aorta. No aneurysmal
dilatation or evidence of dissection. The origins of the great
vessels of the aortic arch appear patent. There is no CT evidence of
pulmonary embolism.

Mediastinum/Nodes: No hilar or mediastinal adenopathy. There is a
small hiatal hernia. The esophagus is otherwise grossly
unremarkable. No mediastinal fluid collection.

Lungs/Pleura: Minimal bibasilar atelectatic changes. There is no
focal consolidation, pleural effusion, or pneumothorax. The central
airways are patent.

Upper Abdomen: Multiple hepatic hypodense lesions measure up to
cm in the left lobe of the liver, incompletely characterized but
most likely cysts. The visualized upper abdomen is otherwise
unremarkable.

Musculoskeletal: No chest wall abnormality. No acute or significant
osseous findings.

Review of the MIP images confirms the above findings.
IMPRESSION: 1. No acute intrathoracic pathology. No CT evidence of pulmonary
embolism.
2. Coronary vascular calcification and stents.
3. Small hiatal hernia.

## 2018-08-01 ENCOUNTER — Other Ambulatory Visit: Payer: Self-pay | Admitting: Physician Assistant

## 2018-08-02 ENCOUNTER — Ambulatory Visit (HOSPITAL_COMMUNITY): Payer: PPO

## 2018-08-04 ENCOUNTER — Ambulatory Visit (HOSPITAL_COMMUNITY): Payer: PPO

## 2018-08-07 ENCOUNTER — Ambulatory Visit (HOSPITAL_COMMUNITY): Payer: PPO

## 2018-08-09 ENCOUNTER — Ambulatory Visit (HOSPITAL_COMMUNITY): Payer: PPO

## 2018-08-11 ENCOUNTER — Ambulatory Visit (HOSPITAL_COMMUNITY): Payer: PPO

## 2018-08-14 ENCOUNTER — Ambulatory Visit (HOSPITAL_COMMUNITY): Payer: PPO

## 2018-08-16 ENCOUNTER — Ambulatory Visit (HOSPITAL_COMMUNITY): Payer: PPO

## 2018-08-17 ENCOUNTER — Encounter: Payer: Self-pay | Admitting: Physician Assistant

## 2018-08-17 ENCOUNTER — Other Ambulatory Visit: Payer: Self-pay

## 2018-08-17 ENCOUNTER — Ambulatory Visit (INDEPENDENT_AMBULATORY_CARE_PROVIDER_SITE_OTHER): Payer: PPO | Admitting: Physician Assistant

## 2018-08-17 VITALS — BP 114/66 | HR 100 | Temp 100.8°F | Resp 16 | Ht 64.0 in | Wt 143.6 lb

## 2018-08-17 DIAGNOSIS — J029 Acute pharyngitis, unspecified: Secondary | ICD-10-CM | POA: Diagnosis not present

## 2018-08-17 LAB — POCT RAPID STREP A (OFFICE): Rapid Strep A Screen: NEGATIVE

## 2018-08-17 NOTE — Patient Instructions (Addendum)
Your rapid strep test is negative.  I am sending a culture to the lab and will call you if you need to start an antibiotic.   Cepacol throat lozenges.  Mucinex will help thin out your mucus to help you clear it out. Drink plenty of water while taking this medication.   Stay well hydrated. Get lost of rest. Wash your hands often.   -Foods that can help speed recovery: honey, garlic, chicken soup, elderberries, green tea.  -Supplements that can help speed recovery: vitamin C, zinc, elderberry extract, quercetin, ginseng, selenium  Advil or ibuprofen for pain. Do not take Aspirin.  Drink enough water and fluids to keep your urine clear or pale yellow.  For sore throat: ? Gargle with 8 oz of salt water ( tsp of salt per 1 qt of water) as often as every 1-2 hours to soothe your throat.  Gargle liquid benadryl.    For sore throat try using a honey-based tea. Use 3 teaspoons of honey with juice squeezed from half lemon. Place shaved pieces of ginger into 1/2-1 cup of water and warm over stove top. Then mix the ingredients and repeat every 4 hours as needed.  Cough Syrup Recipe: Sweet Lemon & Honey Thyme  Ingredients . a handful of fresh thyme sprigs   . 1 pint of water (2 cups)  . 1/2 cup honey (raw is best, but regular will do)  . 1/2 lemon chopped Instructions 1. Place the lemon in the pint jar and cover with the honey. The honey will macerate the lemons and draw out liquids which taste so delicious! 2. Meanwhile, toss the thyme leaves into a saucepan and cover them with the water. 3. Bring the water to a gentle simmer and reduce it to half, about a cup of tea. 4. When the tea is reduced and cooled a bit, strain the sprigs & leaves, add it into the pint jar and stir it well. 5. Give it a shake and use a spoonful as needed. 6. Store your homemade cough syrup in the refrigerator for about a month.    Is there anything I can do on my own to get rid of my cough?  Yes. To help get rid  of your cough, you can: ?Use a humidifier in your bedroom ?Use an over-the-counter cough medicine, or suck on cough drops or hard candy ?Stop smoking, if you smoke ?If you have allergies, avoid the things you are allergic to (like pollen, dust, animals, or mold) If you have acid reflux, your doctor or nurse will tell you which lifestyle changes can help reduce symptoms.     If you have lab work done today you will be contacted with your lab results within the next 2 weeks.  If you have not heard from [place] then please contact us. The fastest way to get your results is to register for My Chart.  Thank you for coming in today. I hope you feel we met your needs.  Feel free to call PCP if you have any questions or further requests.  Please consider signing up for MyChart if you do not already have it, as this is a great way to communicate with me.  Best,  Whitney McVey, PA-C  IF you received an x-ray today, you will receive an invoice from [facility]. Please contact [facility] at [phone] with questions or concerns regarding your invoice.   IF you received labwork today, you will receive an invoice from [facility]. Please contact [facility]  at [phone] with questions or concerns regarding your invoice.   Our billing staff will not be able to assist you with questions regarding bills from these companies.  You will be contacted with the lab results as soon as they are available. The fastest way to get your results is to activate your My Chart account. Instructions are located on the last page of this paperwork. If you have not heard from [place] regarding the results in 2 weeks, please contact this office.

## 2018-08-17 NOTE — Progress Notes (Signed)
Erika Brown  MRN: [id number] DOB: [birth year]  PCP: Burnard Bunting, MD  Subjective:  Pt is a 77 year old female who presents to clinic for sore throat and stuffy nose x 4 days.  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   Review of Systems  Constitutional: Negative for chills, diaphoresis, fatigue and fever.  HENT: Positive for congestion, sneezing and sore throat. Negative for postnasal drip, rhinorrhea, sinus pressure and sinus pain.   Respiratory: Negative for cough, shortness of breath and wheezing.   Psychiatric/Behavioral: Negative for sleep disturbance.    Patient Active Problem List   Diagnosis Date Noted  . Abnormal stress test 02/28/2018  . CAD (coronary artery disease)   . PVC's (premature ventricular contractions) 02/10/2018  . Palpitations 02/10/2018  . Pure hypercholesterolemia 01/07/2017  . SHOULDER PAIN, LEFT 02/11/2009  . Hyperlipidemia LDL goal <70 02/09/2009  . Essential hypertension 02/09/2009  . Coronary artery disease involving native coronary artery of native heart without angina pectoris 02/09/2009    Current Outpatient Medications on File Prior to Visit  Medication Sig Dispense Refill  . amLODipine (NORVASC) 5 MG tablet TAKE 1 TABLET EACH DAY. 90 tablet 3  . aspirin 81 MG EC tablet Take 81 mg by mouth daily.      . clopidogrel (PLAVIX) 75 MG tablet TAKE 1 TABLET ONCE DAILY. 90 tablet 3  . ergocalciferol (VITAMIN D2) 50000 UNITS capsule Take 50,000 Units by mouth every Friday.     . irbesartan-hydrochlorothiazide (AVALIDE) 300-12.5 MG tablet Take 1 tablet by mouth daily.    . irbesartan-hydrochlorothiazide (AVALIDE) 300-12.5 MG tablet TAKE 1 TABLET ONCE DAILY. 90 tablet 3  . meclizine (ANTIVERT) 25 MG tablet Take 12.5 mg by mouth 3 (three) times daily as needed for dizziness.     . metoprolol succinate (TOPROL-XL) 50 MG 24 hr tablet Take 1 tablet (50 mg total)  by mouth daily. 30 tablet 5  . nitroGLYCERIN (NITROSTAT) 0.4 MG SL tablet Place 1 tablet (0.4 mg total) under the tongue every 5 (five) minutes as needed for chest pain. 25 tablet 0  . potassium chloride SA (K-DUR,KLOR-CON) 20 MEQ tablet Take 20 mEq by mouth daily.    . potassium chloride SA (K-DUR,KLOR-CON) 20 MEQ tablet TAKE 1 TABLET ONCE DAILY. 90 tablet 3   No current facility-administered medications on file prior to visit.     Allergies  Allergen Reactions  . Iodine Other (See Comments)    Has had some SHOB and rapid heart rate in the past  . Latex Other (See Comments)    discomfort  . Other Other (See Comments)    Some foods b/c of preservatives   . Statins Other (See Comments)    Intolerance to high dosages, myalgia     Objective:  BP 114/66 (BP Location: Left Arm, Patient Position: Sitting, Cuff Size: Normal)   Pulse 100   Temp (!) 100.8 F (38.2 C) (Oral)   Resp 16   Ht 5\' 4"  (1.626 m)   Wt 143 lb 9.6 oz (65.1 kg)   LMP 11/29/1974   SpO2 95%   BMI 24.65 kg/m   Physical Exam  Constitutional: She is oriented to person, place, and time. No distress.  HENT:  Right Ear: Tympanic membrane normal.  Left Ear: Tympanic membrane normal.  Nose: Mucosal edema present. No rhinorrhea. Right sinus exhibits no maxillary sinus tenderness and no frontal sinus tenderness. Left  sinus exhibits no maxillary sinus tenderness and no frontal sinus tenderness.  Mouth/Throat: Oropharynx is clear and moist and mucous membranes are normal.  Cardiovascular: Normal rate, regular rhythm and normal heart sounds.  Pulmonary/Chest: Effort normal and breath sounds normal. No respiratory distress. She has no wheezes. She has no rales.  Neurological: She is alert and oriented to person, place, and time.  Skin: Skin is warm and dry.  Psychiatric: Judgment normal.  Vitals reviewed.  Results for orders placed or performed in visit on 08/17/18  POCT rapid strep A  Result Value Ref Range   Rapid  Strep A Screen Negative Negative    Assessment and Plan :  1. Sore throat - pt c/o sore throat x 3 days. Rapid strep is negative. culture is pending, Vitals are stable. Advised supportive therapy. RTC if no improvement.  - POCT rapid strep A - Culture, Group A Strep   Mercer Pod, PA-C  [facility] 08/17/2018 11:57 AM  Please note: Portions of this report may have been transcribed using dragon voice recognition software. Every effort was made to ensure accuracy; however, inadvertent computerized transcription errors may be present.

## 2018-08-18 ENCOUNTER — Ambulatory Visit (HOSPITAL_COMMUNITY): Payer: PPO

## 2018-08-18 ENCOUNTER — Ambulatory Visit: Payer: Self-pay | Admitting: *Deleted

## 2018-08-18 NOTE — Telephone Encounter (Signed)
Pt called with temperature 102.7 today at 1130; she says that she took 2 extra strength tylenol at 1130; she is unable to recheck temperature at this time because she just drank, and has candy in her mouth; the pt was seen by Whitney McVey on 08/17/18, and cultures were obtained but are still pending; the pt also said that she took 1 tylenol yesterday; advice given per home care; she verbalizes understanding, and will try home care recommendations; pt states that she will call back if she worsens.       Reason for Disposition . [1] Fever AND [2] no signs of serious infection or localizing symptoms (all other triage questions negative)  Answer Assessment - Initial Assessment Questions 1. TEMPERATURE: "What is the most recent temperature?"  "How was it measured?"      102.7 at 1130 07/2018 2. ONSET: "When did the fever start?"      08/17/18  3. SYMPTOMS: "Do you have any other symptoms besides the fever?"  (e.g., colds, headache, sore throat, earache, cough, rash, diarrhea, vomiting, abdominal pain)   Sore throat 4. CAUSE: If there are no symptoms, ask: "What do you think is causing the fever?"      Sore throat 5. CONTACTS: "Does anyone else in the family have an infection?"     no 6. TREATMENT: "What have you done so far to treat this fever?" (e.g., medications)     tylenol 7. IMMUNOCOMPROMISE: "Do you have of the following: diabetes, HIV positive, splenectomy, cancer chemotherapy, chronic steroid treatment, transplant patient, etc."   no 8. PREGNANCY: "Is there any chance you are pregnant?" "When was your last menstrual period?"     no 9. TRAVEL: "Have you traveled out of the country in the last month?" (e.g., travel history, exposures)     no  Protocols used: FEVER-A-AH

## 2018-08-19 DIAGNOSIS — R05 Cough: Secondary | ICD-10-CM | POA: Diagnosis not present

## 2018-08-19 DIAGNOSIS — J029 Acute pharyngitis, unspecified: Secondary | ICD-10-CM | POA: Diagnosis not present

## 2018-08-20 LAB — CULTURE, GROUP A STREP: Strep A Culture: NEGATIVE

## 2018-08-21 ENCOUNTER — Ambulatory Visit: Payer: PPO | Admitting: Obstetrics & Gynecology

## 2018-08-21 ENCOUNTER — Ambulatory Visit (HOSPITAL_COMMUNITY): Payer: PPO

## 2018-08-23 ENCOUNTER — Ambulatory Visit (HOSPITAL_COMMUNITY): Payer: PPO

## 2018-09-22 ENCOUNTER — Ambulatory Visit: Payer: PPO | Admitting: Physician Assistant

## 2018-09-22 ENCOUNTER — Encounter: Payer: Self-pay | Admitting: Physician Assistant

## 2018-09-22 VITALS — BP 102/60 | HR 62 | Ht 64.0 in | Wt 142.8 lb

## 2018-09-22 DIAGNOSIS — I2510 Atherosclerotic heart disease of native coronary artery without angina pectoris: Secondary | ICD-10-CM

## 2018-09-22 DIAGNOSIS — R002 Palpitations: Secondary | ICD-10-CM

## 2018-09-22 DIAGNOSIS — I10 Essential (primary) hypertension: Secondary | ICD-10-CM

## 2018-09-22 DIAGNOSIS — E785 Hyperlipidemia, unspecified: Secondary | ICD-10-CM

## 2018-09-22 MED ORDER — AMLODIPINE BESYLATE 2.5 MG PO TABS: 2.5000 mg | ORAL_TABLET | Freq: Every day | ORAL | 3 refills | Status: DC

## 2018-09-22 MED ORDER — EZETIMIBE 10 MG PO TABS: 10.0000 mg | ORAL_TABLET | Freq: Every day | ORAL | 3 refills | Status: DC

## 2018-09-22 NOTE — Patient Instructions (Signed)
Medication Instructions:  1. START ZETIA 10 MG; OUR GOAL IS TO HAVE YOU BE ABLE TO TAKE THE ZETIA 10 MG DAILY.   HOWEVER WE WILL START OUT SLOWLY;  FOLLOW  THESE DIRECTIONS: START OUT WITH TAKING 1 TABLET FOR 1 WEEK, THEN GRADUALLY INCREASE WEEK BY ADDING ANOTHER TABLET DURING THE WEEK, EXAMPLE WEEK 2 TAKE 1 TABLET 2 TIMES THAT WEEK AND SO ON. IF ANY TIME YOU BEGIN TO HAVE MUSCLE ACHES OR SIDE EFFECTS PLEASE CALL THE OFFICE [phone]  2. DECREASE AMLODIPINE TO 2.5 MG DAILY; NEW RX HAS BEEN SENT IN  If you need a refill on your cardiac medications before your next appointment, please call your pharmacy.   Lab work: FASTING LIPID AND LIVER PANEL TO BE DONE IN 3-4 MONTHS If you have labs (blood work) drawn today and your tests are completely normal, you will receive your results only by: [name] MyChart Message (if you have MyChart) OR . A paper copy in the mail If you have any lab test that is abnormal or we need to change your treatment, we will call you to review the results.  Testing/Procedures: NONE ORDERED TODAY  Follow-Up: At [facility], you and your health needs are our priority.  As part of our continuing mission to provide you with exceptional heart care, we have created designated Provider Care Teams.  These Care Teams include your primary Cardiologist (physician) and Advanced Practice Providers (APPs -  Physician Assistants and Nurse Practitioners) who all work together to provide you with the care you need, when you need it. . YOU ARE ALREADY SCHEDULED TO SEE DR. VARANASI 11/16/18;  Any Other Special Instructions Will Be Listed Below (If Applicable).

## 2018-09-22 NOTE — Progress Notes (Signed)
Cardiology Office Note:    Date:  09/22/2018   ID:  Erika Brown, DOB May 21, 1941, MRN [id number]  PCP:  Burnard Bunting, MD  Cardiologist:  Nelva Bush, MD   Electrophysiologist:  None   Referring MD: Burnard Bunting, MD   Chief Complaint  Patient presents with  . Follow-up    CAD     History of Present Illness:    Erika Brown is a 77 y.o. female with coronary artery disease status post Cypher DES to the LAD and Cypher DES to the RCA as well as balloon angioplasty of the diagonal in 2006 in the setting of non-ST elevation myocardial infarction, hypertension, hyperlipidemia.  Most recently, Erika Brown underwent cardiac catheterization in April 2019 after an abnormal stress test.  This demonstrated in-stent restenosis in the LAD as well as severe stenosis in the LCx and RCA.  Erika Brown underwent multivessel stenting with a DES to the proximal-mid LAD, DES to the mid LCx and DES to the proximal-mid RCA.  Last seen by Dr. Saunders Revel in July 2019.  Erika Brown complained of some palpitations and her beta-blocker dose was adjusted.  They also discussed +/- ezetimibe therapy.  Erika Brown has intolerance to multiple statins and is not inclined to try PCSK-9 inhibitor therapy.  Erika Brown returns for follow-up.  Erika Brown has not had any chest discomfort or significant shortness of breath.  Erika Brown has been tired.  Erika Brown also had a respiratory illness recently with a high fever.  This resolved after taking antibiotics.  Erika Brown was lightheaded with this and fell on one occasion.  However, since then, Erika Brown has not had any syncope or near syncope.  Erika Brown denies PND or lower extremity swelling.  Prior CV studies:   The following studies were reviewed today:  Cardiac catheterization 02/28/18 LAD proximal 80 ISR, mid 80 LCx mid 90 RCA ostial stent patent, proximal 40, mid 99   EF 55-65 PCI: 3 x 24 mm Synergy DES to the proximal-mid LAD PCI: 2.75 x 20 mm Synergy DES to the mid LCx PCI: 3.5 x 32 mm Synergy DES to the proximal-mid RCA           Nuclear stress test 02/21/18 EF 65, inferior/inferolateral reversible defect consistent with ischemia, intermediate risk  Echo 11/17: Vigorous LVF, EF 65-70, normal wall motion, borderline diastolic function, systolic bowing of mitral valve without prolapse, mild MR, trivial TR, PASP 20  Holter 8/17 Predominantly sinus.  Rare PVCs and PACs.  No significant arrhythmias.  LHC 4/06 1. LEFT MAIN CORONARY ARTERY: The left main coronary artery was normal. 2. LEFT ANTERIOR DESCENDING CORONARY ARTERY: 50% proximal LAD, mid75%-80%, Dx 30%-40%; 1st septal 60% ostial with diff distal vessel disease 3. LEFT CIRCUMFLEX CORONARY ARTERY: 50% ostial lesionin the ramus 4. RIGHT CORONARY ARTERY: 40% ostial stenosis, followed by a 99% focal stenosis; 40% lesion prior to the takeoff of the [place].  LEFT VENTRICULOGRAM: EF 60% with no significant wall motion abnormalities or mitralregurgitation.  PCI 4/06 3.5 x 13 mm Cypher DES to RCA 3 x 28 mm Cypher DES to LAD POBA to Dx  Past Medical History:  Diagnosis Date  . Anxiety and depression   . CAD (coronary artery disease)    S/P NSTEMI 4/06 EF 60%; Treated with Cypher DES to LAD and RCA with kissing balloon PCI diagonal; Treadmill myoview 10/09 for CP: Walked 7:06. mild ST-T-wave changes in recovery. EF 71%. Very mild reversible defect in base of the inferior wall --> medical rx.  . Cancer ([place])  HISTORY OF SKIN CANCER  . Cataract   . Complication of anesthesia    " DIFFICULT TO WAKE UP "  . Depression   . Glucose intolerance (impaired glucose tolerance)   . Heart murmur   . History of echocardiogram    Echo 11/17: Vigorous LVF, EF 65-70, normal wall motion, borderline diastolic function, systolic bowing of mitral valve without prolapse, mild MR, trivial TR, PASP 20  . History of medication noncompliance   . Hyperlipidemia    Intolerate to multiple statins due to severe myalgias  . Hypertension    Possible  white-coat component  . MI (myocardial infarction) ([place])    acute-s/p stent  . Microhematuria   . Psoriasis   . Rapid heart beat   . Vitamin D deficiency    Surgical Hx: The patient  has a past surgical history that includes Coronary stent placement; Appendectomy; Abdominal hysterectomy; Mohs surgery (9/14); Breast biopsy; Tonsillectomy and adenoidectomy; bladder tact; Cataract extraction (Bilateral, 2017); Coronary stent placement (04/02/019); Cardiac catheterization (02/28/2018); LEFT HEART CATH AND CORONARY ANGIOGRAPHY (N/A, 02/28/2018); Ultrasound guidance for vascular access (02/28/2018); and CORONARY STENT INTERVENTION (N/A, 02/28/2018).   Current Medications: Current Meds  Medication Sig  . aspirin 81 MG EC tablet Take 81 mg by mouth daily.    . clopidogrel (PLAVIX) 75 MG tablet Take 75 mg by mouth daily.  . ergocalciferol (VITAMIN D2) 50000 UNITS capsule Take 50,000 Units by mouth every Friday.   . irbesartan-hydrochlorothiazide (AVALIDE) 300-12.5 MG tablet Take 1 tablet by mouth daily.  . meclizine (ANTIVERT) 25 MG tablet Take 12.5 mg by mouth 3 (three) times daily as needed for dizziness.   . metoprolol succinate (TOPROL-XL) 50 MG 24 hr tablet Take 1 tablet (50 mg total) by mouth daily.  . nitroGLYCERIN (NITROSTAT) 0.4 MG SL tablet Place 1 tablet (0.4 mg total) under the tongue every 5 (five) minutes as needed for chest pain.  . potassium chloride SA (K-DUR,KLOR-CON) 20 MEQ tablet Take 20 mEq by mouth daily.  . [DISCONTINUED] amLODipine (NORVASC) 5 MG tablet TAKE 1 TABLET EACH DAY.  . [DISCONTINUED] amLODipine (NORVASC) 5 MG tablet Take 5 mg by mouth daily.  . [DISCONTINUED] clopidogrel (PLAVIX) 75 MG tablet TAKE 1 TABLET ONCE DAILY.  . [DISCONTINUED] potassium chloride SA (K-DUR,KLOR-CON) 20 MEQ tablet TAKE 1 TABLET ONCE DAILY.     Allergies:   Iodine; Latex; Other; and Statins   Social History   Tobacco Use  . Smoking status: Former Research scientist (life sciences)  . Smokeless tobacco: Never Used    Substance Use Topics  . Alcohol use: Yes    Alcohol/week: 4.0 standard drinks    Types: 4 Glasses of wine per week    Comment: glass of wine at dinner most nights  . Drug use: No     Family Hx: The patient's family history includes Asthma in her brother; Breast cancer (age of onset: 58) in her sister; Cancer in her daughter; Depression in her daughter; Diabetes in her paternal grandmother; Heart attack in her father; Heart disease in her father; Hypertension in her brother, maternal grandfather, maternal grandmother, and mother; Melanoma in her child and mother; Rectal cancer in her son; Renal Disease in her maternal grandfather; Stroke in her maternal grandfather and maternal grandmother; Ulcers in her father. There is no history of Coronary artery disease.  ROS:   Please see the history of present illness.    ROS All other systems reviewed and are negative.   EKGs/Labs/Other Test Reviewed:    EKG:  EKG  is not ordered today.    Recent Labs: 02/21/2018: Magnesium 2.0; TSH 1.270 03/30/2018: BUN 25; Creatinine, Ser 0.73; Hemoglobin 13.4; Platelets 226; Potassium 4.0; Sodium 135   Recent Lipid Panel Lab Results  Component Value Date/Time   CHOL 235 (H) 03/02/2016 08:59 AM   TRIG 72 03/02/2016 08:59 AM   HDL 79 03/02/2016 08:59 AM   CHOLHDL 3.0 03/02/2016 08:59 AM   LDLCALC 142 (H) 03/02/2016 08:59 AM   LDLDIRECT 121.7 09/08/2007 08:52 AM    Physical Exam:    VS:  BP 102/60   Pulse 62   Ht 5\' 4"  (1.626 m)   Wt 142 lb 12.8 oz (64.8 kg)   LMP 11/29/1974   SpO2 97%   BMI 24.51 kg/m     Wt Readings from Last 3 Encounters:  09/22/18 142 lb 12.8 oz (64.8 kg)  08/17/18 143 lb 9.6 oz (65.1 kg)  06/19/18 142 lb 12.8 oz (64.8 kg)     Physical Exam  Constitutional: Erika Brown is oriented to person, place, and time. Erika Brown appears well-developed and well-nourished. No distress.  HENT:  Head: Normocephalic and atraumatic.  Neck: Neck supple. No JVD present. No thyromegaly present.   Cardiovascular: Normal rate, regular rhythm, S1 normal, S2 normal and normal heart sounds.  No murmur heard. Pulmonary/Chest: Effort normal. Erika Brown has no rales.  Abdominal: Soft. There is no hepatomegaly.  Musculoskeletal: Erika Brown exhibits no edema.  Lymphadenopathy:    Erika Brown has no cervical adenopathy.  Neurological: Erika Brown is alert and oriented to person, place, and time.  Skin: Skin is warm and dry.  Psychiatric: Erika Brown has a normal mood and affect.    ASSESSMENT & PLAN:    Coronary artery disease involving native coronary artery of native heart without angina pectoris S/p NSTEMI in 2006 tx with DES to the LAD, DEs to the RCA and POBA to the Dx.  Erika Brown is now s/p multivessel stenting with DES to the LAD, DES to the LCx and DES to the RCA in April 2019.  Erika Brown is doing well without anginal symptoms.  Continue amlodipine, aspirin, Plavix, metoprolol.  Erika Brown will follow up with Dr. Casandra Doffing and me going forward given Dr. Darnelle Bos transition to [facility].    Essential hypertension Blood pressure somewhat low.  Erika Brown does feel tired with this.  Therefore, I will reduce her amlodipine to 2.5 mg daily.  Hyperlipidemia LDL goal <70  Erika Brown has been intolerant to statins and is not interested in trying PCSK9 inhibitors.  We discussed trying Zetia and Erika Brown is willing to try this.  Start Zetia 10 mg daily.  Erika Brown will start taking this once a week, then twice a week, then 3 times a week, etc.  Obtain lipids and LFTs in 3 to 4 months.  Palpitations Quiescent on current medical therapy.   Dispo:  Return in about 8 weeks (around 11/16/2018) for Scheduled Follow Up w/ Dr. Irish Lack.   Medication Adjustments/Labs and Tests Ordered: Current medicines are reviewed at length with the patient today.  Concerns regarding medicines are outlined above.  Tests Ordered: Orders Placed This Encounter  Procedures  . Lipid Profile  . Hepatic function panel   Medication Changes: Meds ordered this encounter  Medications  .  ezetimibe (ZETIA) 10 MG tablet    Sig: Take 1 tablet (10 mg total) by mouth daily.    Dispense:  90 tablet    Refill:  3  . amLODipine (NORVASC) 2.5 MG tablet    Sig: Take 1 tablet (2.5 mg total) by  mouth daily.    Dispense:  90 tablet    Refill:  3    DECREASE IN DOSE    Signed, Richardson Dopp, PA-C  09/22/2018 1:41 PM    [facility] [place], [place], [place]  [address] Phone: [phone]; Fax: [phone]

## 2018-09-25 ENCOUNTER — Encounter: Payer: Self-pay | Admitting: Obstetrics & Gynecology

## 2018-09-25 DIAGNOSIS — I10 Essential (primary) hypertension: Secondary | ICD-10-CM | POA: Diagnosis not present

## 2018-09-25 DIAGNOSIS — E7849 Other hyperlipidemia: Secondary | ICD-10-CM | POA: Diagnosis not present

## 2018-09-25 DIAGNOSIS — M859 Disorder of bone density and structure, unspecified: Secondary | ICD-10-CM | POA: Diagnosis not present

## 2018-09-25 DIAGNOSIS — R82998 Other abnormal findings in urine: Secondary | ICD-10-CM | POA: Diagnosis not present

## 2018-10-02 DIAGNOSIS — J302 Other seasonal allergic rhinitis: Secondary | ICD-10-CM | POA: Diagnosis not present

## 2018-10-02 DIAGNOSIS — I10 Essential (primary) hypertension: Secondary | ICD-10-CM | POA: Diagnosis not present

## 2018-10-02 DIAGNOSIS — M859 Disorder of bone density and structure, unspecified: Secondary | ICD-10-CM | POA: Diagnosis not present

## 2018-10-02 DIAGNOSIS — Z0000 Encounter for general adult medical examination without abnormal findings: Secondary | ICD-10-CM | POA: Diagnosis not present

## 2018-10-02 DIAGNOSIS — F419 Anxiety disorder, unspecified: Secondary | ICD-10-CM | POA: Diagnosis not present

## 2018-10-02 DIAGNOSIS — E7849 Other hyperlipidemia: Secondary | ICD-10-CM | POA: Diagnosis not present

## 2018-10-02 DIAGNOSIS — Z6824 Body mass index (BMI) 24.0-24.9, adult: Secondary | ICD-10-CM | POA: Diagnosis not present

## 2018-10-02 DIAGNOSIS — Z1389 Encounter for screening for other disorder: Secondary | ICD-10-CM | POA: Diagnosis not present

## 2018-10-02 DIAGNOSIS — Z9861 Coronary angioplasty status: Secondary | ICD-10-CM | POA: Diagnosis not present

## 2018-10-02 DIAGNOSIS — H8110 Benign paroxysmal vertigo, unspecified ear: Secondary | ICD-10-CM | POA: Diagnosis not present

## 2018-10-23 ENCOUNTER — Telehealth: Payer: Self-pay | Admitting: Interventional Cardiology

## 2018-10-23 MED ORDER — AMLODIPINE BESYLATE 5 MG PO TABS: 5.0000 mg | ORAL_TABLET | Freq: Every day | ORAL | 0 refills | Status: DC

## 2018-10-23 NOTE — Telephone Encounter (Signed)
Follow up  ° ° °Patient is returning your call. °

## 2018-10-23 NOTE — Telephone Encounter (Signed)
Attempted to contact patient but there was no answer. Will try again at another time.  

## 2018-10-23 NOTE — Telephone Encounter (Signed)
Returned call to patient who states that for the past week or so that she has had elevated BP readings. She states that she has had heaviness around her eyes and face and has had lightheadedness at times. She denies having any chest pain, HAs, changes in vision, speech, or strength, or any other Sx. Patient states that when she was seen last that her amlodipine was decreased to 2.5 mg QD. Patient also takes irbesartan-HCTZ 300-12.5 mg QD, and Toprol 50 mg QD. Patient states that her readings for the past few days are as follows:  BP- 162/90 HR- 81, BP- 150/90 HR- 82, BP- 157/94 HR- 80, and BP-147/95 HR- 84. Instructed the patient to avoid salt in her diet. Made patient aware that she can take an extra 2.5 mg of her amlodipine and that I will forward to Richardson Dopp, PA for review and recommendation.

## 2018-10-23 NOTE — Telephone Encounter (Signed)
Returned call to patient and made her aware to increase her amlodipine back to 5 mg QD. Instructed the patient to continue to monitor BP and let us know if her SBP is <100 or if her BP is consistently > 130/80. Patient verbalized understanding and thanked me for the call.

## 2018-10-23 NOTE — Telephone Encounter (Signed)
  Pt c/o BP issue: STAT if pt c/o blurred vision, one-sided weakness or slurred speech  1. What are your last 5 BP readings? 162/90 P 81, 150/90 P 82, 137/81, 145/92 P 80, 128/95 P 70  2. Are you having any other symptoms (ex. Dizziness, headache, blurred vision, passed out)? Says she has a sense of fullness in her head  3. What is your BP issue? Richardson Dopp changed her medication and patient is concerned because her BP has been higher since this change.

## 2018-10-23 NOTE — Telephone Encounter (Signed)
Increase Amlodipine back to 5 mg QD. Monitor BP and call if SBP < 100 or BP > 130/80 on regular basis. Richardson Dopp, PA-C    10/23/2018 1:51 PM

## 2018-11-01 ENCOUNTER — Other Ambulatory Visit: Payer: Self-pay | Admitting: Internal Medicine

## 2018-11-01 DIAGNOSIS — R002 Palpitations: Secondary | ICD-10-CM

## 2018-11-01 MED ORDER — METOPROLOL SUCCINATE ER 50 MG PO TB24: 50.0000 mg | ORAL_TABLET | Freq: Every day | ORAL | 3 refills | Status: DC

## 2018-11-15 NOTE — Progress Notes (Signed)
Cardiology Office Note   Date:  11/16/2018   ID:  Erika Brown, Erika Brown February 29, 1941, MRN [id number]  PCP:  Burnard Bunting, MD    No chief complaint on file.  CAD  Wt Readings from Last 3 Encounters:  11/16/18 142 lb 12.8 oz (64.8 kg)  09/22/18 142 lb 12.8 oz (64.8 kg)  08/17/18 143 lb 9.6 oz (65.1 kg)       History of Present Illness: Erika Brown is a 77 y.o. female  is  aformer patient of Dr. Saunders Revel with coronary artery disease status post Cypher DES to the LAD and Cypher DES to the RCA as well as balloon angioplasty of the diagonal in 2006 in the setting of non-ST elevation myocardial infarction, hypertension, hyperlipidemia.  Most recently, she underwent cardiac catheterization in April 2019 after an abnormal stress test.  This demonstrated in-stent restenosis in the LAD as well as severe stenosis in the LCx and RCA.  She underwent multivessel stenting with a DES to the proximal-mid LAD, DES to the mid LCx and DES to the proximal-mid RCA.  Last seen by Dr. Saunders Revel in July 2019.  She complained of some palpitations and her beta-blocker dose was adjusted.  They also discussed +/- ezetimibe therapy.  She has intolerance to multiple statins and is not inclined to try PCSK-9 inhibitor therapy.  She had three vessel stenting in April 2019.    SHe is trying to eat healthier.  She is decreasing meat intake.  She started Zetia in 10/19.  She will be rechecked in February.  She gradually increased the medicine.      Past Medical History:  Diagnosis Date  . Anxiety and depression   . CAD (coronary artery disease)    S/P NSTEMI 4/06 EF 60%; Treated with Cypher DES to LAD and RCA with kissing balloon PCI diagonal; Treadmill myoview 10/09 for CP: Walked 7:06. mild ST-T-wave changes in recovery. EF 71%. Very mild reversible defect in base of the inferior wall --> medical rx.  . Cancer ([place])    HISTORY OF SKIN CANCER  . Cataract   . Complication of anesthesia    " DIFFICULT TO WAKE UP "    . Depression   . Glucose intolerance (impaired glucose tolerance)   . Heart murmur   . History of echocardiogram    Echo 11/17: Vigorous LVF, EF 65-70, normal wall motion, borderline diastolic function, systolic bowing of mitral valve without prolapse, mild MR, trivial TR, PASP 20  . History of medication noncompliance   . Hyperlipidemia    Intolerate to multiple statins due to severe myalgias  . Hypertension    Possible white-coat component  . MI (myocardial infarction) ([place])    acute-s/p stent  . Microhematuria   . Psoriasis   . Rapid heart beat   . Vitamin D deficiency     Past Surgical History:  Procedure Laterality Date  . ABDOMINAL HYSTERECTOMY     and anterior repair  . APPENDECTOMY    . bladder tact    . BREAST BIOPSY    . CARDIAC CATHETERIZATION  02/28/2018  . CATARACT EXTRACTION Bilateral 2017   Dr. Katy Fitch  . CORONARY STENT INTERVENTION N/A 02/28/2018   Procedure: CORONARY STENT INTERVENTION;  Surgeon: Jettie Booze, MD;  Location: [facility];  Service: Cardiovascular;  Laterality: N/A;  . CORONARY STENT PLACEMENT    . CORONARY STENT PLACEMENT  04/02/019  . LEFT HEART CATH AND CORONARY ANGIOGRAPHY N/A 02/28/2018   Procedure: LEFT  HEART CATH AND CORONARY ANGIOGRAPHY;  Surgeon: Jettie Booze, MD;  Location: [facility];  Service: Cardiovascular;  Laterality: N/A;  . MOHS SURGERY  9/14   BCC.  [facility]  . TONSILLECTOMY AND ADENOIDECTOMY    . ULTRASOUND GUIDANCE FOR VASCULAR ACCESS  02/28/2018   Procedure: Ultrasound Guidance For Vascular Access;  Surgeon: Jettie Booze, MD;  Location: [facility];  Service: Cardiovascular;;     Current Outpatient Medications  Medication Sig Dispense Refill  . amLODipine (NORVASC) 5 MG tablet Take 1 tablet (5 mg total) by mouth daily. 30 tablet 0  . aspirin 81 MG EC tablet Take 81 mg by mouth daily.      . clopidogrel (PLAVIX) 75 MG tablet Take 75 mg by mouth daily.    . ergocalciferol  (VITAMIN D2) 50000 UNITS capsule Take 50,000 Units by mouth. Take every Tues and Fri.    . ezetimibe (ZETIA) 10 MG tablet Take 1 tablet (10 mg total) by mouth daily. 90 tablet 3  . irbesartan-hydrochlorothiazide (AVALIDE) 300-12.5 MG tablet Take 1 tablet by mouth daily.    . meclizine (ANTIVERT) 25 MG tablet Take 12.5 mg by mouth 3 (three) times daily as needed for dizziness.     . metoprolol succinate (TOPROL-XL) 50 MG 24 hr tablet Take 1 tablet (50 mg total) by mouth daily. 90 tablet 3  . nitroGLYCERIN (NITROSTAT) 0.4 MG SL tablet Place 1 tablet (0.4 mg total) under the tongue every 5 (five) minutes as needed for chest pain. 25 tablet 0  . potassium chloride SA (K-DUR,KLOR-CON) 20 MEQ tablet Take 20 mEq by mouth daily.     No current facility-administered medications for this visit.     Allergies:   Iodine; Latex; Other; and Statins    Social History:  The patient  reports that she has quit smoking. She has never used smokeless tobacco. She reports current alcohol use of about 4.0 standard drinks of alcohol per week. She reports that she does not use drugs.   Family History:  The patient's family history includes Asthma in her brother; Breast cancer (age of onset: 22) in her sister; Cancer in her daughter; Depression in her daughter; Diabetes in her paternal grandmother; Heart attack in her father; Heart disease in her father; Hypertension in her brother, maternal grandfather, maternal grandmother, and mother; Melanoma in her child and mother; Rectal cancer in her son; Renal Disease in her maternal grandfather; Stroke in her maternal grandfather and maternal grandmother; Ulcers in her father.    ROS:  Please see the history of present illness.   Otherwise, review of systems are positive for occasional palpitations.   All other systems are reviewed and negative.    PHYSICAL EXAM: VS:  BP 106/76   Pulse 69   Ht 5\' 4"  (1.626 m)   Wt 142 lb 12.8 oz (64.8 kg)   LMP 11/29/1974   SpO2 95%    BMI 24.51 kg/m  , BMI Body mass index is 24.51 kg/m. GEN: Well nourished, well developed, in no acute distress  HEENT: normal  Neck: no JVD, carotid bruits, or masses Cardiac: RRR; no murmurs, rubs, or gallops,no edema  Respiratory:  clear to auscultation bilaterally, normal work of breathing GI: soft, nontender, nondistended, + BS MS: no deformity or atrophy  Skin: warm and dry, no rash Neuro:  Strength and sensation are intact Psych: euthymic mood, full affect   EKG:   The ekg ordered today demonstrates NSR, PACs, no ST changes  Recent Labs: 02/21/2018: Magnesium 2.0; TSH 1.270 03/30/2018: BUN 25; Creatinine, Ser 0.73; Hemoglobin 13.4; Platelets 226; Potassium 4.0; Sodium 135   Lipid Panel    Component Value Date/Time   CHOL 235 (H) 03/02/2016 0859   TRIG 72 03/02/2016 0859   HDL 79 03/02/2016 0859   CHOLHDL 3.0 03/02/2016 0859   VLDL 14 03/02/2016 0859   LDLCALC 142 (H) 03/02/2016 0859   LDLDIRECT 121.7 09/08/2007 0852     Other studies Reviewed: Additional studies/ records that were reviewed today with results demonstrating: cath records reviewed.   ASSESSMENT AND PLAN:  1. CAD: No angina.  Continue aggressive secondary prevention.  Eating healthy.  Continue DAPT.   2. HTN: The current medical regimen is effective;  continue present plan and medications. 3. Hyperlipidemia: Started Zetia.  To be rechecked.   4. Mitral regurgitation: Soft murmur on exam.  Appears euvolemic.    Current medicines are reviewed at length with the patient today.  The patient concerns regarding her medicines were addressed.  The following changes have been made:  No change  Labs/ tests ordered today include:  No orders of the defined types were placed in this encounter.   Recommend 150 minutes/week of aerobic exercise Low fat, low carb, high fiber diet recommended  Disposition:   FU in 6 months   Signed, Larae Grooms, MD  11/16/2018 10:05 AM    [facility] [address], [place], [place]  [address] Phone: [phone]; Fax: [phone]

## 2018-11-16 ENCOUNTER — Encounter: Payer: Self-pay | Admitting: Interventional Cardiology

## 2018-11-16 ENCOUNTER — Ambulatory Visit: Payer: PPO | Admitting: Interventional Cardiology

## 2018-11-16 VITALS — BP 106/76 | HR 69 | Ht 64.0 in | Wt 142.8 lb

## 2018-11-16 DIAGNOSIS — I2510 Atherosclerotic heart disease of native coronary artery without angina pectoris: Secondary | ICD-10-CM

## 2018-11-16 DIAGNOSIS — I340 Nonrheumatic mitral (valve) insufficiency: Secondary | ICD-10-CM

## 2018-11-16 DIAGNOSIS — I10 Essential (primary) hypertension: Secondary | ICD-10-CM

## 2018-11-16 DIAGNOSIS — E785 Hyperlipidemia, unspecified: Secondary | ICD-10-CM

## 2018-11-16 NOTE — Patient Instructions (Signed)

## 2018-12-15 ENCOUNTER — Ambulatory Visit (INDEPENDENT_AMBULATORY_CARE_PROVIDER_SITE_OTHER): Payer: PPO | Admitting: Obstetrics & Gynecology

## 2018-12-15 ENCOUNTER — Other Ambulatory Visit: Payer: Self-pay

## 2018-12-15 ENCOUNTER — Encounter: Payer: Self-pay | Admitting: Obstetrics & Gynecology

## 2018-12-15 ENCOUNTER — Telehealth: Payer: Self-pay | Admitting: Obstetrics & Gynecology

## 2018-12-15 VITALS — BP 132/80 | HR 72 | Resp 16 | Ht 64.0 in | Wt 142.4 lb

## 2018-12-15 DIAGNOSIS — Z01419 Encounter for gynecological examination (general) (routine) without abnormal findings: Secondary | ICD-10-CM

## 2018-12-15 DIAGNOSIS — Z0000 Encounter for general adult medical examination without abnormal findings: Secondary | ICD-10-CM | POA: Diagnosis not present

## 2018-12-15 DIAGNOSIS — R102 Pelvic and perineal pain: Secondary | ICD-10-CM | POA: Diagnosis not present

## 2018-12-15 LAB — POCT URINALYSIS DIPSTICK
Bilirubin, UA: NEGATIVE
Blood, UA: NEGATIVE
GLUCOSE UA: NEGATIVE
Ketones, UA: NEGATIVE
Leukocytes, UA: NEGATIVE
NITRITE UA: NEGATIVE
PROTEIN UA: NEGATIVE
Urobilinogen, UA: 0.2 E.U./dL
pH, UA: 6 (ref 5.0–8.0)

## 2018-12-15 MED ORDER — SULFAMETHOXAZOLE-TRIMETHOPRIM 800-160 MG PO TABS: 1.0000 | ORAL_TABLET | Freq: Two times a day (BID) | ORAL | 0 refills | Status: DC

## 2018-12-15 NOTE — Progress Notes (Signed)
78 y.o. G81P7 Married White or Caucasian female here for annual exam.  Daughter just diagnosed with breast cancer.  She has ductal ca.  She is going to have genetic testing.  Pt does not want genetic testing even if her daughter is positive.  Denies vaginal bleeding.    She's having low back discomfort and pelvic and low pressure discomfort.  She reports she typically has a BM after every meal.  The BMs are formed.  They are never lose.  She has declined repeating her colonoscopy.  Willing to do cologuard this year.  Biggest complaint is low sensation of pressure and discomfort in low back.  Lab work done with Dr. Reynaldo Minium from late October reviewed.  She did have a urine culture done due to abnormal u/a.  E coli 100,000 colonies and Proteus both grew.  Sensitivities were also provided.  This was never treated per pt.  Feel this could be contributing to her current symptoms.    Had stents this summer.  Seeing Dr. Irish Lack 11/16/18.  Has started Zetia.  Having increased gas from this.    Patient's last menstrual period was 11/29/1974.          Sexually active: Yes.    The current method of family planning is status post hysterectomy.     Exercising: Yes.     Smoker:  no  Health Maintenance: Pap:  2011 Neg  History of abnormal Pap:  yes MMG: 2008.  Declines having one done even with daughter's diagnosis.   Colonoscopy:  2005 Normal.  Will do cologuard this year.  Declines colonoscopy. BMD:   2008 TDaP: 2011 Pneumonia vaccine(s):  2016 by PCP Shingrix:   No Hep C testing: n/a Screening Labs: PCP.  She did bring copies of lab work with her.     reports that she has quit smoking. She has never used smokeless tobacco. She reports current alcohol use of about 4.0 standard drinks of alcohol per week. She reports that she does not use drugs.  Past Medical History:  Diagnosis Date  . Anxiety and depression   . CAD (coronary artery disease)    S/P NSTEMI 4/06 EF 60%; Treated with Cypher DES to  LAD and RCA with kissing balloon PCI diagonal; Treadmill myoview 10/09 for CP: Walked 7:06. mild ST-T-wave changes in recovery. EF 71%. Very mild reversible defect in base of the inferior wall --> medical rx.  . Cancer ([place])    HISTORY OF SKIN CANCER  . Cataract   . Complication of anesthesia    " DIFFICULT TO WAKE UP "  . Depression   . Glucose intolerance (impaired glucose tolerance)   . Heart murmur   . History of echocardiogram    Echo 11/17: Vigorous LVF, EF 65-70, normal wall motion, borderline diastolic function, systolic bowing of mitral valve without prolapse, mild MR, trivial TR, PASP 20  . History of medication noncompliance   . Hyperlipidemia    Intolerate to multiple statins due to severe myalgias  . Hypertension    Possible white-coat component  . MI (myocardial infarction) ([place])    acute-s/p stent  . Microhematuria   . Psoriasis   . Rapid heart beat   . Vitamin D deficiency     Past Surgical History:  Procedure Laterality Date  . ABDOMINAL HYSTERECTOMY     and anterior repair  . APPENDECTOMY    . bladder tact    . BREAST BIOPSY    . CARDIAC CATHETERIZATION  02/28/2018  . CATARACT  EXTRACTION Bilateral 2017   Dr. Katy Fitch  . CORONARY STENT INTERVENTION N/A 02/28/2018   Procedure: CORONARY STENT INTERVENTION;  Surgeon: Jettie Booze, MD;  Location: [facility];  Service: Cardiovascular;  Laterality: N/A;  . CORONARY STENT PLACEMENT    . CORONARY STENT PLACEMENT  04/02/019  . LEFT HEART CATH AND CORONARY ANGIOGRAPHY N/A 02/28/2018   Procedure: LEFT HEART CATH AND CORONARY ANGIOGRAPHY;  Surgeon: Jettie Booze, MD;  Location: [facility];  Service: Cardiovascular;  Laterality: N/A;  . MOHS SURGERY  9/14   BCC.  [facility]  . TONSILLECTOMY AND ADENOIDECTOMY    . ULTRASOUND GUIDANCE FOR VASCULAR ACCESS  02/28/2018   Procedure: Ultrasound Guidance For Vascular Access;  Surgeon: Jettie Booze, MD;  Location: [facility];  Service:  Cardiovascular;;    Current Outpatient Medications  Medication Sig Dispense Refill  . amLODipine (NORVASC) 5 MG tablet Take 1 tablet (5 mg total) by mouth daily. 30 tablet 0  . aspirin 81 MG EC tablet Take 81 mg by mouth daily.      . clopidogrel (PLAVIX) 75 MG tablet Take 75 mg by mouth daily.    . ergocalciferol (VITAMIN D2) 50000 UNITS capsule Take 50,000 Units by mouth. Take every Tues and Fri.    . ezetimibe (ZETIA) 10 MG tablet Take 1 tablet (10 mg total) by mouth daily. 90 tablet 3  . irbesartan-hydrochlorothiazide (AVALIDE) 300-12.5 MG tablet Take 1 tablet by mouth daily.    . meclizine (ANTIVERT) 25 MG tablet Take 12.5 mg by mouth 3 (three) times daily as needed for dizziness.     . metoprolol succinate (TOPROL-XL) 50 MG 24 hr tablet Take 1 tablet (50 mg total) by mouth daily. 90 tablet 3  . potassium chloride SA (K-DUR,KLOR-CON) 20 MEQ tablet Take 20 mEq by mouth daily.    . nitroGLYCERIN (NITROSTAT) 0.4 MG SL tablet Place 1 tablet (0.4 mg total) under the tongue every 5 (five) minutes as needed for chest pain. (Patient not taking: Reported on 12/15/2018) 25 tablet 0   No current facility-administered medications for this visit.     Family History  Problem Relation Age of Onset  . Heart disease Father   . Heart attack Father   . Ulcers Father   . Hypertension Mother   . Melanoma Mother   . Cancer Daughter        melanoma  . Breast cancer Daughter   . Stroke Maternal Grandmother   . Hypertension Maternal Grandmother   . Stroke Maternal Grandfather   . Renal Disease Maternal Grandfather        kidney removed  . Hypertension Maternal Grandfather   . Diabetes Paternal Grandmother   . Melanoma Child        Metastatic  . Breast cancer Sister 53  . Hypertension Brother   . Asthma Brother   . Depression Daughter        x2  . Rectal cancer Son   . Coronary artery disease Neg Hx     Review of Systems  All other systems reviewed and are negative.  Exam:   BP 132/80  (BP Location: Left Arm, Patient Position: Sitting, Cuff Size: Normal)   Pulse 72   Resp 16   Ht 5\' 4"  (1.626 m)   Wt 142 lb 6.4 oz (64.6 kg)   LMP 11/29/1974   BMI 24.44 kg/m   Height: 5\' 4"  (162.6 cm)  Ht Readings from Last 3 Encounters:  12/15/18 5'  4" (1.626 m)  11/16/18 5\' 4"  (1.626 m)  09/22/18 5\' 4"  (1.626 m)    General appearance: alert, cooperative and appears stated age Head: Normocephalic, without obvious abnormality, atraumatic Neck: no adenopathy, supple, symmetrical, trachea midline and thyroid normal to inspection and palpation Lungs: clear to auscultation bilaterally Breasts: normal appearance, no masses or tenderness Heart: regular rate and rhythm Abdomen: soft, non-tender; bowel sounds normal; no masses,  no organomegaly Extremities: extremities normal, atraumatic, no cyanosis or edema Skin: Skin color, texture, turgor normal. No rashes or lesions Lymph nodes: Cervical, supraclavicular, and axillary nodes normal. No abnormal inguinal nodes palpated Neurologic: Grossly normal   Pelvic: External genitalia:  no lesions              Urethra:  normal appearing urethra with no masses, tenderness or lesions              Bartholins and Skenes: normal                 Vagina: normal appearing vagina with normal color and discharge, no lesions              Cervix: absent              Pap taken: No. Bimanual Exam:  Uterus:  uterus absent              Adnexa: no mass, fullness, tenderness and no pelvic masses noted, non tender except with palpation of bladder               Rectovaginal: Confirms               Anus:  normal sphincter tone, no lesions  Chaperone was present for exam.  A:  Well Woman with normal exam PMP, no HRT H/o vulvar rash with biopsy showing spongiotic psoriasis-form demratitis Hypertension Recent cardiac stent placements 73 yo daughter with breast cancer, undergoing genetic testing and treatment recommendations UTI in October, not  treated Pelvic discomfort and low back pain  P:   Mammogram not ordered.  Declines.  Despite daughter's new diagnosis, still does not want this done.   pap smear not indicated Urine culture pending.  Rx for bactrim DS BID x 5 days sent to pharmacy Declines colonoscopy again.  Willing to do cologuard given change in pelvic discomfort (although this may all be due to UTI) Declines BMD Declines Shingrix vaccination return annually or prn

## 2018-12-15 NOTE — Telephone Encounter (Signed)
Reviewed with Dr. Sabra Heck. Call returned to patient. List of PCP and contacts provided. Patient will call directly to schedule. Patient aware to call if any additional assistance needed. Patient verbalizes understanding and is thankful.   Encounter closed.

## 2018-12-15 NOTE — Telephone Encounter (Signed)
Patient was seen this AM by Dr. Sabra Heck; states she recommended two different primary care physicians, but patient cannot remember those names.

## 2018-12-17 LAB — URINE CULTURE: Organism ID, Bacteria: NO GROWTH

## 2018-12-18 ENCOUNTER — Other Ambulatory Visit: Payer: Self-pay | Admitting: *Deleted

## 2018-12-18 MED ORDER — AMLODIPINE BESYLATE 5 MG PO TABS: 5.0000 mg | ORAL_TABLET | Freq: Every day | ORAL | 3 refills | Status: DC

## 2019-01-01 DIAGNOSIS — Z1211 Encounter for screening for malignant neoplasm of colon: Secondary | ICD-10-CM | POA: Diagnosis not present

## 2019-01-01 DIAGNOSIS — Z1212 Encounter for screening for malignant neoplasm of rectum: Secondary | ICD-10-CM | POA: Diagnosis not present

## 2019-01-04 LAB — COLOGUARD: COLOGUARD: NEGATIVE

## 2019-01-11 ENCOUNTER — Other Ambulatory Visit: Payer: Self-pay | Admitting: *Deleted

## 2019-01-11 ENCOUNTER — Telehealth: Payer: Self-pay | Admitting: *Deleted

## 2019-01-11 DIAGNOSIS — Z1211 Encounter for screening for malignant neoplasm of colon: Secondary | ICD-10-CM

## 2019-01-11 DIAGNOSIS — Z1212 Encounter for screening for malignant neoplasm of rectum: Secondary | ICD-10-CM

## 2019-01-11 NOTE — Telephone Encounter (Signed)
Call to patient. Patient notified of negative cologuard results and verbalized understanding.   Encounter closed.

## 2019-01-26 ENCOUNTER — Other Ambulatory Visit: Payer: PPO | Admitting: *Deleted

## 2019-01-26 DIAGNOSIS — E785 Hyperlipidemia, unspecified: Secondary | ICD-10-CM

## 2019-01-26 LAB — HEPATIC FUNCTION PANEL
ALBUMIN: 4.2 g/dL (ref 3.7–4.7)
ALK PHOS: 76 IU/L (ref 39–117)
ALT: 18 IU/L (ref 0–32)
AST: 16 IU/L (ref 0–40)
BILIRUBIN TOTAL: 0.5 mg/dL (ref 0.0–1.2)
BILIRUBIN, DIRECT: 0.13 mg/dL (ref 0.00–0.40)
Total Protein: 6.7 g/dL (ref 6.0–8.5)

## 2019-01-26 LAB — LIPID PANEL
Chol/HDL Ratio: 2.4 ratio (ref 0.0–4.4)
Cholesterol, Total: 207 mg/dL — ABNORMAL HIGH (ref 100–199)
HDL: 87 mg/dL (ref >39)
LDL Calculated: 108 mg/dL — ABNORMAL HIGH (ref 0–99)
Triglycerides: 59 mg/dL (ref 0–149)
VLDL Cholesterol Cal: 12 mg/dL (ref 5–40)

## 2019-01-29 ENCOUNTER — Telehealth: Payer: Self-pay

## 2019-01-29 NOTE — Telephone Encounter (Signed)
Notes recorded by Frederik Schmidt, RN on 01/29/2019 at 8:19 AM EST The patient has been notified of the result and verbalized understanding. All questions (if any) were answered. Frederik Schmidt, RN 01/29/2019 8:19 AM

## 2019-01-29 NOTE — Telephone Encounter (Signed)
-----   Message from Liliane Shi, [place] sent at 01/28/2019  9:00 PM EST ----- LDL ("bad cholesterol") is improved on Zetia.  The liver enzymes (AST, ALT) are normal.   Recommendations:  - Continue current medications and follow up as planned.  Richardson Dopp, PA-C    01/28/2019 8:59 PM

## 2019-02-14 ENCOUNTER — Other Ambulatory Visit: Payer: Self-pay | Admitting: Internal Medicine

## 2019-02-14 NOTE — Telephone Encounter (Signed)
Please review for refill. Thanks!  

## 2019-03-19 ENCOUNTER — Other Ambulatory Visit: Payer: Self-pay | Admitting: Interventional Cardiology

## 2019-05-31 DIAGNOSIS — L237 Allergic contact dermatitis due to plants, except food: Secondary | ICD-10-CM | POA: Diagnosis not present

## 2019-06-08 ENCOUNTER — Telehealth: Payer: Self-pay | Admitting: Interventional Cardiology

## 2019-06-08 NOTE — Telephone Encounter (Signed)
Pt called to report that she was on Prednisone prescribed by Dr. Reynaldo Minium for poison ivy for 6 days 10mg  and she finished this past Wed 06/06/19.Erika Brown  Whole she was taking it she noticed she was very tired and her HR has been fluctuating and has been higher than normal for her. She has been checking it with her BP cuff and her pulse ox..  133/79   90 111/68   65 122/69   84 100/58   92 117/69   91  121, 115 a few days ago on her pulse ox  She denies dizziness, sob, chest pain, no palpitations just feels her heart beating fast when it is above 90.. she has been hydrating well and no OTC meds and no antihistamines with decongestant when she was treating her poison ivy..   I have asked the pt to let Dr. Reynaldo Minium know that this seems to be a potential side effect of her taking the steroids.. I will forward to Dr. Irish Lack to review.. she will continue to monitor and let us know if anything changes or develops any worsening symptoms.. will see how she feels the next few days as the prednisone gets out of her system. She will eat some K rich foods and continue to take her meds especially her Metoprolol 50mg .

## 2019-06-08 NOTE — Telephone Encounter (Signed)
New Message:     Patient calling concerning her heart rate is runing very high. Patient also has some question concering some medication.

## 2019-06-10 NOTE — Telephone Encounter (Signed)
I agree.  WIll see how BP and HR are after she is off steroids.

## 2019-06-11 NOTE — Telephone Encounter (Signed)
Left message for patient to call back if she is continuing to have issues with her BP and HR.

## 2019-06-14 ENCOUNTER — Other Ambulatory Visit: Payer: Self-pay | Admitting: Interventional Cardiology

## 2019-07-02 ENCOUNTER — Other Ambulatory Visit: Payer: Self-pay | Admitting: Internal Medicine

## 2019-07-27 DIAGNOSIS — L570 Actinic keratosis: Secondary | ICD-10-CM | POA: Diagnosis not present

## 2019-07-27 DIAGNOSIS — D225 Melanocytic nevi of trunk: Secondary | ICD-10-CM | POA: Diagnosis not present

## 2019-07-27 DIAGNOSIS — L739 Follicular disorder, unspecified: Secondary | ICD-10-CM | POA: Diagnosis not present

## 2019-07-27 DIAGNOSIS — L821 Other seborrheic keratosis: Secondary | ICD-10-CM | POA: Diagnosis not present

## 2019-07-27 DIAGNOSIS — D2272 Melanocytic nevi of left lower limb, including hip: Secondary | ICD-10-CM | POA: Diagnosis not present

## 2019-07-27 DIAGNOSIS — D485 Neoplasm of uncertain behavior of skin: Secondary | ICD-10-CM | POA: Diagnosis not present

## 2019-07-27 DIAGNOSIS — D2271 Melanocytic nevi of right lower limb, including hip: Secondary | ICD-10-CM | POA: Diagnosis not present

## 2019-07-27 DIAGNOSIS — L814 Other melanin hyperpigmentation: Secondary | ICD-10-CM | POA: Diagnosis not present

## 2019-07-27 DIAGNOSIS — D2262 Melanocytic nevi of left upper limb, including shoulder: Secondary | ICD-10-CM | POA: Diagnosis not present

## 2019-07-27 DIAGNOSIS — Z85828 Personal history of other malignant neoplasm of skin: Secondary | ICD-10-CM | POA: Diagnosis not present

## 2019-08-02 ENCOUNTER — Other Ambulatory Visit: Payer: Self-pay | Admitting: Internal Medicine

## 2019-08-02 NOTE — Telephone Encounter (Signed)
Please review for refill. Thanks!  

## 2019-10-01 ENCOUNTER — Telehealth: Payer: Self-pay | Admitting: Interventional Cardiology

## 2019-10-01 DIAGNOSIS — R002 Palpitations: Secondary | ICD-10-CM

## 2019-10-01 NOTE — Telephone Encounter (Signed)
Called and made patient aware that Dr. Irish Lack would like for her to wear a 3 day ZIO. Instructions reviewed and address and insurance info verified. Patient aware that monitor technician will mail monitor out to her.

## 2019-10-01 NOTE — Telephone Encounter (Signed)
Patient c/o Palpitations:  High priority if patient c/o lightheadedness, shortness of breath, or chest pain  1) How long have you had palpitations/irregular HR/ Afib? Are you having the symptoms now?  It started on Thursday(09-27-19)  2) Are you currently experiencing lightheadedness, SOB or CP? no 3) Do you have a history of afib (atrial fibrillation) or irregular heart rhythm?  Have had irregular hear rhythm over the years  4) Have you checked your BP or HR? (document readings if available): 10-01-19- 134/81 and pulse 83, 09-29-19- 130/77 and pulse [id number] it was   09-27-19- 143/82 and pulse 79, 09-28-19- 165/58 and pulse 73  5) Are you experiencing any other symptoms? no

## 2019-10-01 NOTE — Telephone Encounter (Signed)
Called and spoke to patient. She states that she has been having an irregular heartbeat since Thursday. She states that her HR has been 70-80s. She denies SOB or chest pain. She states that she has had a lot of stress in her life lately. Denies excessive caffeine or alcohol use. Monitor from 2017 showed PACs and PVCs. Patient on Toprol 50 mg QD. BP and HR readings are below. Will forward to Dr. Irish Lack for review and recommendation.

## 2019-10-01 NOTE — Telephone Encounter (Signed)
3 day Zio patch for palpitations.

## 2019-10-03 ENCOUNTER — Telehealth: Payer: Self-pay | Admitting: *Deleted

## 2019-10-03 NOTE — Telephone Encounter (Signed)
3 day ZIO XT long term holter monitor to be mailed to the patients home.  Instructions reviewed briefly as they are included in the monitor kit. 

## 2019-10-10 ENCOUNTER — Other Ambulatory Visit: Payer: Self-pay | Admitting: Physician Assistant

## 2019-10-10 NOTE — Telephone Encounter (Signed)
Per [employer] tracking, monitor arrived at [facility] distribution center 10/08/19 at 1:04 pm.  Delivery was due 11/10, but states in transit, arriving late.  Tracking # [id number].

## 2019-10-10 NOTE — Telephone Encounter (Signed)
Patient called stating she had not received her monitor, she was told she would receive it by Monday.

## 2019-10-11 ENCOUNTER — Ambulatory Visit (INDEPENDENT_AMBULATORY_CARE_PROVIDER_SITE_OTHER): Payer: PPO

## 2019-10-11 DIAGNOSIS — R002 Palpitations: Secondary | ICD-10-CM | POA: Diagnosis not present

## 2019-10-26 DIAGNOSIS — R002 Palpitations: Secondary | ICD-10-CM | POA: Diagnosis not present

## 2019-10-31 ENCOUNTER — Telehealth: Payer: Self-pay | Admitting: Interventional Cardiology

## 2019-10-31 NOTE — Telephone Encounter (Signed)
Called and made patient aware that results have been downloaded and Dr. Irish Lack still needs to review and we will call her with results and recommendations.

## 2019-10-31 NOTE — Telephone Encounter (Signed)
New Message    Pt is calling about results from the monitor     Please call

## 2019-11-02 ENCOUNTER — Other Ambulatory Visit: Payer: Self-pay

## 2019-11-02 ENCOUNTER — Other Ambulatory Visit: Payer: Self-pay | Admitting: Internal Medicine

## 2019-11-02 DIAGNOSIS — I340 Nonrheumatic mitral (valve) insufficiency: Secondary | ICD-10-CM

## 2019-11-02 NOTE — Telephone Encounter (Signed)
Refill Request.  

## 2019-11-07 ENCOUNTER — Ambulatory Visit (INDEPENDENT_AMBULATORY_CARE_PROVIDER_SITE_OTHER): Payer: PPO | Admitting: Family Medicine

## 2019-11-07 ENCOUNTER — Encounter: Payer: Self-pay | Admitting: Family Medicine

## 2019-11-07 ENCOUNTER — Other Ambulatory Visit: Payer: Self-pay

## 2019-11-07 VITALS — BP 120/70 | HR 79 | Resp 12 | Ht 64.0 in | Wt 146.0 lb

## 2019-11-07 DIAGNOSIS — E785 Hyperlipidemia, unspecified: Secondary | ICD-10-CM | POA: Diagnosis not present

## 2019-11-07 DIAGNOSIS — Z23 Encounter for immunization: Secondary | ICD-10-CM

## 2019-11-07 DIAGNOSIS — I10 Essential (primary) hypertension: Secondary | ICD-10-CM | POA: Diagnosis not present

## 2019-11-07 DIAGNOSIS — E559 Vitamin D deficiency, unspecified: Secondary | ICD-10-CM | POA: Insufficient documentation

## 2019-11-07 DIAGNOSIS — I25118 Atherosclerotic heart disease of native coronary artery with other forms of angina pectoris: Secondary | ICD-10-CM

## 2019-11-07 LAB — BASIC METABOLIC PANEL
BUN: 23 mg/dL (ref 6–23)
CO2: 30 mEq/L (ref 19–32)
Calcium: 9.9 mg/dL (ref 8.4–10.5)
Chloride: 101 mEq/L (ref 96–112)
Creatinine, Ser: 0.79 mg/dL (ref 0.40–1.20)
GFR: 70.04 mL/min (ref >60.00)
Glucose, Bld: 91 mg/dL (ref 70–99)
Potassium: 4 mEq/L (ref 3.5–5.1)
Sodium: 138 mEq/L (ref 135–145)

## 2019-11-07 LAB — VITAMIN D 25 HYDROXY (VIT D DEFICIENCY, FRACTURES): VITD: 53.2 ng/mL (ref 30.00–100.00)

## 2019-11-07 NOTE — Progress Notes (Signed)
HPI:   Ms.Geraline C Sonne is a 78 y.o. female, who is here today to establish care.  Former PCP: Dr Reynaldo Minium. Last preventive routine visit: 09/2017.  Chronic medical problems: Anemia,prediabetes,skin cancer (follows with dermatologist),past hx of depression (4-5 years ago), CAD (MI in 2006), HLD,and HTN among some.  Vit D deficiency: She is on Ergocalciferol 50,000 U 2 times per week.  HTN: Dx'ed in 1993 She is on Ibesartan-HCTZ 300-12.5 mg daily, Amlodipine 5 mg daily,and Metoprolol succinate 50 mg daily.  Negative for severe/frequent headache, visual changes, chest pain, dyspnea,claudication, focal weakness, or edema. + MR, non rheumatic. She has had some palpitations, no more than usual and not related with exertion.  CAD: She is on Plavix 75 mg daily,Aspirin 81 mg daily,and Zetia 10 mg daily. She has not tolerated well statins.  S/P stent placement, x 5. Cardia cath in 02/2018 showed stent re-stenosis LAD and RCA.  She follows with cardiologist, Dr Irish Lack.  Review of Systems  Constitutional: Negative for activity change, appetite change, fatigue and fever.  HENT: Negative for mouth sores, nosebleeds and sore throat.   Respiratory: Negative for cough and wheezing.   Gastrointestinal: Negative for abdominal pain, nausea and vomiting.       Negative for changes in bowel habits.  Genitourinary: Negative for decreased urine volume and hematuria.  Musculoskeletal: Negative for gait problem and myalgias.  Skin: Negative for rash and wound.  Neurological: Negative for syncope, facial asymmetry and speech difficulty.  Psychiatric/Behavioral: Negative for confusion. The patient is not nervous/anxious.   Rest see pertinent positives and negatives per HPI.   Current Outpatient Medications on File Prior to Visit  Medication Sig Dispense Refill   amLODipine (NORVASC) 5 MG tablet TAKE 1 TABLET ONCE DAILY. 90 tablet 1   aspirin 81 MG EC tablet Take 81 mg by mouth daily.        clopidogrel (PLAVIX) 75 MG tablet TAKE 1 TABLET ONCE DAILY. 90 tablet 2   Ergocalciferol (VITAMIN D2 PO) Take 1.25 mg by mouth 2 (two) times a week.     ezetimibe (ZETIA) 10 MG tablet Take 1 tablet (10 mg total) by mouth daily. Please make yearly appt with Dr. Irish Lack for December before anymore refills. 1st attempt 90 tablet 0   irbesartan-hydrochlorothiazide (AVALIDE) 300-12.5 MG tablet TAKE 1 TABLET ONCE DAILY. 90 tablet 3   meclizine (ANTIVERT) 25 MG tablet Take 12.5 mg by mouth 3 (three) times daily as needed for dizziness.      metoprolol succinate (TOPROL-XL) 50 MG 24 hr tablet Take 1 tablet (50 mg total) by mouth daily. 90 tablet 3   potassium chloride SA (KLOR-CON) 20 MEQ tablet Take 1 tablet (20 mEq total) by mouth daily. Please make overdue appt with Dr. Irish Lack before anymore refills. 1st attempt 30 tablet 0   nitroGLYCERIN (NITROSTAT) 0.4 MG SL tablet Place 1 tablet (0.4 mg total) under the tongue every 5 (five) minutes as needed for chest pain. (Patient not taking: Reported on 12/15/2018) 25 tablet 0   No current facility-administered medications on file prior to visit.     Past Medical History:  Diagnosis Date   Anxiety and depression    CAD (coronary artery disease)    S/P NSTEMI 4/06 EF 60%; Treated with Cypher DES to LAD and RCA with kissing balloon PCI diagonal; Treadmill myoview 10/09 for CP: Walked 7:06. mild ST-T-wave changes in recovery. EF 71%. Very mild reversible defect in base of the inferior wall --> medical rx.  Cancer ([place])    HISTORY OF SKIN CANCER   Cataract    Complication of anesthesia    " DIFFICULT TO WAKE UP "   Depression    Glucose intolerance (impaired glucose tolerance)    Heart murmur    History of echocardiogram    Echo 11/17: Vigorous LVF, EF 65-70, normal wall motion, borderline diastolic function, systolic bowing of mitral valve without prolapse, mild MR, trivial TR, PASP 20   History of medication noncompliance     Hyperlipidemia    Intolerate to multiple statins due to severe myalgias   Hypertension    Possible white-coat component   MI (myocardial infarction) ([place])    acute-s/p stent   Microhematuria    Psoriasis    Rapid heart beat    Vitamin D deficiency    Allergies  Allergen Reactions   Iodine Other (See Comments)    Has had some SHOB and rapid heart rate in the past   Latex Other (See Comments)    discomfort   Other Other (See Comments)    Some foods b/c of preservatives    Statins Other (See Comments)    Intolerance to high dosages, myalgia    Family History  Problem Relation Age of Onset   Heart disease Father    Heart attack Father    Ulcers Father    Hypertension Mother    Melanoma Mother    Cancer Daughter        melanoma   Breast cancer Daughter    Stroke Maternal Grandmother    Hypertension Maternal Grandmother    Stroke Maternal Grandfather    Renal Disease Maternal Grandfather        kidney removed   Hypertension Maternal Grandfather    Diabetes Paternal Grandmother    Melanoma Child        Metastatic   Breast cancer Sister 14   Hypertension Brother    Asthma Brother    Depression Daughter        x2   Rectal cancer Son    Coronary artery disease Neg Hx     Social History   Socioeconomic History   Marital status: Married    Spouse name: Not on file   Number of children: 7   Years of education: Not on file   Highest education level: Not on file  Occupational History   Occupation: Works at home, Charity fundraiser, 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  Tobacco Use   Smoking status: Former Smoker   Smokeless tobacco: Never Used  Substance and Sexual Activity   Alcohol use: Yes    Alcohol/week: 4.0 standard drinks    Types: 4 Glasses of wine per week   Drug use: No   Sexual activity: Not Currently    Birth control/protection: Abstinence, Surgical    Comment: TVH  Other Topics Concern   Not on file  Social History Narrative    Lives in [place] with her husband   Seven kids   Plays tennis   Very good card player   Social Determinants of Health   Financial Resource Strain:    Difficulty of Paying Living Expenses: Not on file  Food Insecurity:    Worried About Charity fundraiser in the Last Year: Not on file   [employer] of Food in the Last Year: Not on file  Transportation Needs:    Lack of Transportation (Medical): Not on file   Lack of Transportation (Non-Medical): Not on file  Physical Activity:    Days of  Exercise per Week: Not on file   Minutes of Exercise per Session: Not on file  Stress:    Feeling of Stress : Not on file  Social Connections:    Frequency of Communication with Friends and Family: Not on file   Frequency of Social Gatherings with Friends and Family: Not on file   Attends Religious Services: Not on file   Active Member of Clubs or Organizations: Not on file   Attends Archivist Meetings: Not on file   Marital Status: Not on file    Vitals:   11/07/19 0939  BP: 120/70  Pulse: 79  Resp: 12  SpO2: 97%    Body mass index is 25.06 kg/m.  Physical Exam  Nursing note and vitals reviewed. Constitutional: She is oriented to person, place, and time. She appears well-developed and well-nourished. No distress.  HENT:  Head: Normocephalic and atraumatic.  Mouth/Throat: Oropharynx is clear and moist and mucous membranes are normal.  Eyes: Pupils are equal, round, and reactive to light. Conjunctivae are normal.  Cardiovascular: Normal rate. An irregular rhythm present.  No murmur heard. Pulses:      Dorsalis pedis pulses are 2+ on the right side and 2+ on the left side.  Respiratory: Effort normal and breath sounds normal. No respiratory distress.  GI: Soft. She exhibits no mass. There is no hepatomegaly. There is no abdominal tenderness.  Musculoskeletal:        General: Edema present.  Lymphadenopathy:    She has no cervical adenopathy.  Neurological:  She is alert and oriented to person, place, and time. She has normal strength. No cranial nerve deficit. Gait normal.  Skin: Skin is warm. No rash noted. No erythema.  Psychiatric: She has a normal mood and affect.  Well groomed, good eye contact.   ASSESSMENT AND PLAN:  Ms. Hartleigh was seen today for establish care.  Diagnoses and all orders for this visit:  Orders Placed This Encounter  Procedures   Pneumococcal polysaccharide vaccine 23-valent greater than or equal to 2yo subcutaneous/IM   Basic Metabolic Panel   VITAMIN D 25 Hydroxy (Vit-D Deficiency, Fractures)   Lab Results  Component Value Date   CREATININE 0.79 11/07/2019   BUN 23 11/07/2019   NA 138 11/07/2019   K 4.0 11/07/2019   CL 101 11/07/2019   CO2 30 11/07/2019    Essential hypertension Adequately controlled. No changes in current management.  Continue low salt diet. Monitor BP regularly.  Hyperlipidemia LDL goal <70 She has not tolerated statins well,on allergy meds list. Continue Zetia 10 mg daily and low fat diet.  Vitamin D deficiency, unspecified No changes in current management, will follow labs done today and will give further recommendations accordingly.  -     VITAMIN D 25 Hydroxy (Vit-D Deficiency, Fractures)  Need for 23-polyvalent pneumococcal polysaccharide vaccine -     Pneumococcal polysaccharide vaccine 23-valent greater than or equal to 2yo subcutaneous/IM  Atherosclerotic heart disease of native coronary artery with other forms of angina pectoris (HCC) Stable. Continue Plavix 75 mg daily,Aspirin 81 mg daily,Metoprolol,and Zetia. Following with cardiologist.    Return in about 4 months (around 03/07/2020) for HTN.     Helia Haese G. Martinique, MD  [facility]. [place] office.

## 2019-11-07 NOTE — Patient Instructions (Signed)
A few things to remember from today's visit:   Essential hypertension - Plan: Basic Metabolic Panel  Hyperlipidemia LDL goal <70  Vitamin D deficiency, unspecified - Plan: VITAMIN D 25 Hydroxy (Vit-D Deficiency, Fractures)  No changes today.  Please be sure medication list is accurate. If a new problem present, please set up appointment sooner than planned today.

## 2019-11-09 ENCOUNTER — Ambulatory Visit (HOSPITAL_COMMUNITY): Payer: PPO | Attending: Cardiovascular Disease

## 2019-11-09 ENCOUNTER — Encounter: Payer: Self-pay | Admitting: Family Medicine

## 2019-11-09 ENCOUNTER — Other Ambulatory Visit: Payer: Self-pay

## 2019-11-09 DIAGNOSIS — I340 Nonrheumatic mitral (valve) insufficiency: Secondary | ICD-10-CM | POA: Insufficient documentation

## 2019-11-09 NOTE — Progress Notes (Signed)
Normal LV and valvular function.

## 2019-12-05 ENCOUNTER — Other Ambulatory Visit: Payer: Self-pay | Admitting: Internal Medicine

## 2019-12-05 ENCOUNTER — Other Ambulatory Visit: Payer: Self-pay | Admitting: Interventional Cardiology

## 2019-12-05 NOTE — Telephone Encounter (Signed)
Please review for refill. Thank you! 

## 2019-12-11 ENCOUNTER — Ambulatory Visit: Payer: PPO | Attending: Internal Medicine

## 2019-12-11 DIAGNOSIS — Z20822 Contact with and (suspected) exposure to covid-19: Secondary | ICD-10-CM

## 2019-12-12 LAB — NOVEL CORONAVIRUS, NAA: SARS-CoV-2, NAA: NOT DETECTED

## 2019-12-16 DIAGNOSIS — Z20822 Contact with and (suspected) exposure to covid-19: Secondary | ICD-10-CM | POA: Diagnosis not present

## 2019-12-16 DIAGNOSIS — Z20828 Contact with and (suspected) exposure to other viral communicable diseases: Secondary | ICD-10-CM | POA: Diagnosis not present

## 2019-12-18 ENCOUNTER — Telehealth: Payer: Self-pay | Admitting: Physician Assistant

## 2019-12-18 ENCOUNTER — Ambulatory Visit: Payer: Self-pay

## 2019-12-18 ENCOUNTER — Telehealth: Payer: Self-pay | Admitting: Family Medicine

## 2019-12-18 ENCOUNTER — Encounter: Payer: Self-pay | Admitting: Physician Assistant

## 2019-12-18 ENCOUNTER — Other Ambulatory Visit: Payer: Self-pay | Admitting: Physician Assistant

## 2019-12-18 DIAGNOSIS — I2510 Atherosclerotic heart disease of native coronary artery without angina pectoris: Secondary | ICD-10-CM

## 2019-12-18 DIAGNOSIS — R54 Age-related physical debility: Secondary | ICD-10-CM

## 2019-12-18 DIAGNOSIS — U071 COVID-19: Secondary | ICD-10-CM

## 2019-12-18 DIAGNOSIS — I10 Essential (primary) hypertension: Secondary | ICD-10-CM

## 2019-12-18 NOTE — Telephone Encounter (Signed)
Patient daughter called back stating that she did call contact number for for Infusion left message for a return call Has not receiced  Call yet.  Wold like to have Dr.  Betty Martinique return call.

## 2019-12-18 NOTE — Telephone Encounter (Signed)
Pt states that her husband recently passed away from covid and her daughter recently tested positive for covid. She has lost sense of smell,running fevers on and off,has headache.She was just tested [facility] and currently waiting on results.Please advise

## 2019-12-18 NOTE — Telephone Encounter (Signed)
ret'd call to pt.  Reported her husband passed away on 12/27/19, from [place].  Reported she started having cough on 12/13/19.  Pt. Stated she was recently tested [facility], and is awaiting her results.  Has developed loss of smell and has had fluctuating fevers, as high as 101.8.  Has not had fever in past 2 days.  C/o clear runny nose, and coughing up clear phlegm at intervals.  Also c/o intermittent headache.  Reported she was given Rx per "early protocol" from a friend that is a doctor.  Pt. Reported she was advised to take Ivermectin on day 1 and day 3, Vita D 3, 4000 U daily, Vita C 2000 mg. BID-TID, Zinc 100 mg qd, Melatonin at bedtime, and Quercetin (pt. Stated she did not get the Quercetin)   Pt. Stated she is feeling that she is getting better.   Advised pt. on CDC guidelines for quarantining for 10 days from onset of symptoms.  Also advised she should be fever free x 3 consecutive days, without use of antipyretics, and seeing improvement in respiratory symptoms, prior to coming out of quarantine.  Encouraged rest and hydration, and to contact Dr. Martinique if symptoms are not improving.  Advised to seek ER care for worsening shortness of breath, chest heaviness, and weakness.  Pt. Verb. Understanding.   Stated she was advised by her friend, that is a doctor, not to take the Vita D 2 50,000 U. dose, while she is taking the Vita D 3.  Is asking when she should resume the Vita D 2 dose, and if she can have a new prescription for this? (noted that it was ordered by Historical Provider)   Offered encouragement and support to pt. In coping with the recent loss of her husband.  Verb. her appreciation.  Advised will send note to Dr. Martinique to make her aware of the above.  Pt. Agreed with plan.

## 2019-12-18 NOTE — Telephone Encounter (Signed)
In coming call from daughter.  Stating that of Patient HR is   Between 93   -121  Temperature 102.8 heart rate 89.   Patient reports lost of smell.  Patient had Tylenol at 11 am.   400mg  of Ibuprofen @200pm .  Daughter requested contact number  To get Infusion clinic.  Provided  Contact number.  Daughter voiced understanding.

## 2019-12-18 NOTE — Telephone Encounter (Signed)
  I connected by phone with Erika Brown on 12/18/2019 at 5:19 PM to discuss the potential use of an new treatment for mild to moderate COVID-19 viral infection in non-hospitalized patients.  This patient is a 79 y.o. female that meets the FDA criteria for Emergency Use Authorization of bamlanivimab or casirivimab\imdevimab.  Has a (+) direct SARS-CoV-2 viral test result  Has mild or moderate COVID-19   Is ? 80 years of age and weighs ? 40 kg  Is NOT hospitalized due to COVID-19  Is NOT requiring oxygen therapy or requiring an increase in baseline oxygen flow rate due to COVID-19  Is within 10 days of symptom onset  Has at least one of the high risk factor(s) for progression to severe COVID-19 and/or hospitalization as defined in EUA.  Specific high risk criteria : >/= 80 yo   I have spoken and communicated the following to the patient or parent/caregiver:  1. FDA has authorized the emergency use of bamlanivimab and casirivimab\imdevimab for the treatment of mild to moderate COVID-19 in adults and pediatric patients with positive results of direct SARS-CoV-2 viral testing who are 13 years of age and older weighing at least 40 kg, and who are at high risk for progressing to severe COVID-19 and/or hospitalization.  2. The significant known and potential risks and benefits of bamlanivimab and casirivimab\imdevimab, and the extent to which such potential risks and benefits are unknown.  3. Information on available alternative treatments and the risks and benefits of those alternatives, including clinical trials.  4. Patients treated with bamlanivimab and casirivimab\imdevimab should continue to self-isolate and use infection control measures (e.g., wear mask, isolate, social distance, avoid sharing personal items, clean and disinfect "high touch" surfaces, and frequent handwashing) according to CDC guidelines.   5. The patient or parent/caregiver has the option to accept or refuse  bamlanivimab or casirivimab\imdevimab .  After reviewing this information with the patient, The patient agreed to proceed with receiving the bamlanimivab infusion and will be provided a copy of the Fact sheet prior to receiving the infusion.    Pt set up for 12/20/19 @ 10:30am for the infusion. Told to bring [facility] with her.    [name] 12/18/2019 5:19 PM

## 2019-12-19 ENCOUNTER — Encounter: Payer: Self-pay | Admitting: Physician Assistant

## 2019-12-19 ENCOUNTER — Other Ambulatory Visit: Payer: Self-pay | Admitting: Physician Assistant

## 2019-12-19 NOTE — Telephone Encounter (Signed)
Can pt take just the vitamin d2 and stop the vitamin d3, or should she stay on the D3?

## 2019-12-19 NOTE — Telephone Encounter (Signed)
She is not supposed to be taking Vit D3 while taking Vit D2. If she completed treatment with Vit D2 she can continue just with Vit D3 . Thanks, BJ

## 2019-12-20 ENCOUNTER — Other Ambulatory Visit: Payer: Self-pay

## 2019-12-20 ENCOUNTER — Ambulatory Visit: Payer: PPO | Admitting: Interventional Cardiology

## 2019-12-20 ENCOUNTER — Emergency Department (HOSPITAL_COMMUNITY): Payer: PPO

## 2019-12-20 ENCOUNTER — Inpatient Hospital Stay (HOSPITAL_COMMUNITY)
Admission: EM | Admit: 2019-12-20 | Discharge: 2019-12-24 | DRG: 177 | Disposition: A | Discharge status: Home / Self Care | Payer: PPO | Attending: Internal Medicine | Admitting: Internal Medicine

## 2019-12-20 ENCOUNTER — Encounter (HOSPITAL_COMMUNITY): Payer: Self-pay | Admitting: Emergency Medicine

## 2019-12-20 ENCOUNTER — Ambulatory Visit (HOSPITAL_COMMUNITY)
Admission: RE | Admit: 2019-12-20 | Discharge: 2019-12-20 | Disposition: A | Discharge status: Home / Self Care | Payer: Medicare Other | Source: Ambulatory Visit | Attending: Pulmonary Disease | Admitting: Pulmonary Disease

## 2019-12-20 DIAGNOSIS — I2510 Atherosclerotic heart disease of native coronary artery without angina pectoris: Secondary | ICD-10-CM | POA: Diagnosis not present

## 2019-12-20 DIAGNOSIS — E878 Other disorders of electrolyte and fluid balance, not elsewhere classified: Secondary | ICD-10-CM | POA: Diagnosis not present

## 2019-12-20 DIAGNOSIS — Z833 Family history of diabetes mellitus: Secondary | ICD-10-CM | POA: Diagnosis not present

## 2019-12-20 DIAGNOSIS — I10 Essential (primary) hypertension: Secondary | ICD-10-CM | POA: Diagnosis not present

## 2019-12-20 DIAGNOSIS — R0602 Shortness of breath: Secondary | ICD-10-CM

## 2019-12-20 DIAGNOSIS — Z818 Family history of other mental and behavioral disorders: Secondary | ICD-10-CM | POA: Diagnosis not present

## 2019-12-20 DIAGNOSIS — J9601 Acute respiratory failure with hypoxia: Secondary | ICD-10-CM | POA: Diagnosis not present

## 2019-12-20 DIAGNOSIS — I252 Old myocardial infarction: Secondary | ICD-10-CM | POA: Diagnosis not present

## 2019-12-20 DIAGNOSIS — Z8249 Family history of ischemic heart disease and other diseases of the circulatory system: Secondary | ICD-10-CM

## 2019-12-20 DIAGNOSIS — J189 Pneumonia, unspecified organism: Secondary | ICD-10-CM | POA: Diagnosis not present

## 2019-12-20 DIAGNOSIS — R05 Cough: Secondary | ICD-10-CM | POA: Diagnosis not present

## 2019-12-20 DIAGNOSIS — R54 Age-related physical debility: Secondary | ICD-10-CM | POA: Diagnosis present

## 2019-12-20 DIAGNOSIS — Z23 Encounter for immunization: Secondary | ICD-10-CM | POA: Insufficient documentation

## 2019-12-20 DIAGNOSIS — J1282 Pneumonia due to coronavirus disease 2019: Secondary | ICD-10-CM

## 2019-12-20 DIAGNOSIS — Z955 Presence of coronary angioplasty implant and graft: Secondary | ICD-10-CM

## 2019-12-20 DIAGNOSIS — E871 Hypo-osmolality and hyponatremia: Secondary | ICD-10-CM | POA: Diagnosis not present

## 2019-12-20 DIAGNOSIS — Z823 Family history of stroke: Secondary | ICD-10-CM | POA: Diagnosis not present

## 2019-12-20 DIAGNOSIS — I119 Hypertensive heart disease without heart failure: Secondary | ICD-10-CM | POA: Diagnosis present

## 2019-12-20 DIAGNOSIS — U071 COVID-19: Principal | ICD-10-CM

## 2019-12-20 DIAGNOSIS — Z87891 Personal history of nicotine dependence: Secondary | ICD-10-CM | POA: Diagnosis not present

## 2019-12-20 DIAGNOSIS — Z803 Family history of malignant neoplasm of breast: Secondary | ICD-10-CM

## 2019-12-20 DIAGNOSIS — E559 Vitamin D deficiency, unspecified: Secondary | ICD-10-CM

## 2019-12-20 DIAGNOSIS — Z85828 Personal history of other malignant neoplasm of skin: Secondary | ICD-10-CM

## 2019-12-20 DIAGNOSIS — R0902 Hypoxemia: Secondary | ICD-10-CM

## 2019-12-20 DIAGNOSIS — J159 Unspecified bacterial pneumonia: Secondary | ICD-10-CM | POA: Diagnosis present

## 2019-12-20 DIAGNOSIS — Z66 Do not resuscitate: Secondary | ICD-10-CM | POA: Diagnosis present

## 2019-12-20 DIAGNOSIS — Z825 Family history of asthma and other chronic lower respiratory diseases: Secondary | ICD-10-CM | POA: Diagnosis not present

## 2019-12-20 DIAGNOSIS — E7800 Pure hypercholesterolemia, unspecified: Secondary | ICD-10-CM

## 2019-12-20 DIAGNOSIS — J80 Acute respiratory distress syndrome: Secondary | ICD-10-CM | POA: Diagnosis not present

## 2019-12-20 DIAGNOSIS — Z7902 Long term (current) use of antithrombotics/antiplatelets: Secondary | ICD-10-CM

## 2019-12-20 DIAGNOSIS — Z808 Family history of malignant neoplasm of other organs or systems: Secondary | ICD-10-CM

## 2019-12-20 DIAGNOSIS — E785 Hyperlipidemia, unspecified: Secondary | ICD-10-CM

## 2019-12-20 DIAGNOSIS — Z888 Allergy status to other drugs, medicaments and biological substances status: Secondary | ICD-10-CM

## 2019-12-20 DIAGNOSIS — Z7982 Long term (current) use of aspirin: Secondary | ICD-10-CM

## 2019-12-20 DIAGNOSIS — L409 Psoriasis, unspecified: Secondary | ICD-10-CM | POA: Diagnosis present

## 2019-12-20 DIAGNOSIS — Z91040 Latex allergy status: Secondary | ICD-10-CM

## 2019-12-20 DIAGNOSIS — Z800 Family history of malignant neoplasm of digestive organs: Secondary | ICD-10-CM

## 2019-12-20 LAB — COMPREHENSIVE METABOLIC PANEL
ALT: 51 U/L — ABNORMAL HIGH (ref 0–44)
AST: 41 U/L (ref 15–41)
Albumin: 3.1 g/dL — ABNORMAL LOW (ref 3.5–5.0)
Alkaline Phosphatase: 60 U/L (ref 38–126)
Anion gap: 13 (ref 5–15)
BUN: 11 mg/dL (ref 8–23)
CO2: 24 mmol/L (ref 22–32)
Calcium: 8.9 mg/dL (ref 8.9–10.3)
Chloride: 93 mmol/L — ABNORMAL LOW (ref 98–111)
Creatinine, Ser: 0.89 mg/dL (ref 0.44–1.00)
GFR calc Af Amer: >60 mL/min (ref >60)
GFR calc non Af Amer: >60 mL/min (ref >60)
Glucose, Bld: 157 mg/dL — ABNORMAL HIGH (ref 70–99)
Potassium: 3.6 mmol/L (ref 3.5–5.1)
Sodium: 130 mmol/L — ABNORMAL LOW (ref 135–145)
Total Bilirubin: 0.6 mg/dL (ref 0.3–1.2)
Total Protein: 6.5 g/dL (ref 6.5–8.1)

## 2019-12-20 LAB — CBC WITH DIFFERENTIAL/PLATELET
Abs Immature Granulocytes: 0.02 10*3/uL (ref 0.00–0.07)
Basophils Absolute: 0 10*3/uL (ref 0.0–0.1)
Basophils Relative: 0 %
Eosinophils Absolute: 0 10*3/uL (ref 0.0–0.5)
Eosinophils Relative: 0 %
HCT: 39.3 % (ref 36.0–46.0)
Hemoglobin: 12.9 g/dL (ref 12.0–15.0)
Immature Granulocytes: 0 %
Lymphocytes Relative: 3 %
Lymphs Abs: 0.2 10*3/uL — ABNORMAL LOW (ref 0.7–4.0)
MCH: 27.3 pg (ref 26.0–34.0)
MCHC: 32.8 g/dL (ref 30.0–36.0)
MCV: 83.1 fL (ref 80.0–100.0)
Monocytes Absolute: 0.2 10*3/uL (ref 0.1–1.0)
Monocytes Relative: 3 %
Neutro Abs: 6.7 10*3/uL (ref 1.7–7.7)
Neutrophils Relative %: 94 %
Platelets: 159 10*3/uL (ref 150–400)
RBC: 4.73 MIL/uL (ref 3.87–5.11)
RDW: 13.4 % (ref 11.5–15.5)
WBC: 7.2 10*3/uL (ref 4.0–10.5)
nRBC: 0 % (ref 0.0–0.2)

## 2019-12-20 LAB — FERRITIN: Ferritin: 366 ng/mL — ABNORMAL HIGH (ref 11–307)

## 2019-12-20 LAB — BRAIN NATRIURETIC PEPTIDE: B Natriuretic Peptide: 37.1 pg/mL (ref 0.0–100.0)

## 2019-12-20 LAB — C-REACTIVE PROTEIN: CRP: 17.6 mg/dL — ABNORMAL HIGH (ref <1.0)

## 2019-12-20 LAB — PROCALCITONIN: Procalcitonin: 0.85 ng/mL

## 2019-12-20 LAB — LACTATE DEHYDROGENASE: LDH: 266 U/L — ABNORMAL HIGH (ref 98–192)

## 2019-12-20 LAB — ABO/RH: ABO/RH(D): O POS

## 2019-12-20 LAB — LACTIC ACID, PLASMA
Lactic Acid, Venous: 2.2 mmol/L (ref 0.5–1.9)
Lactic Acid, Venous: 1.3 mmol/L (ref 0.5–1.9)

## 2019-12-20 LAB — FIBRINOGEN: Fibrinogen: 535 mg/dL — ABNORMAL HIGH (ref 210–475)

## 2019-12-20 LAB — RESPIRATORY PANEL BY RT PCR (FLU A&B, COVID)
Influenza A by PCR: NEGATIVE
Influenza B by PCR: NEGATIVE
SARS Coronavirus 2 by RT PCR: POSITIVE — AB

## 2019-12-20 LAB — CBG MONITORING, ED: Glucose-Capillary: 110 mg/dL — ABNORMAL HIGH (ref 70–99)

## 2019-12-20 LAB — TRIGLYCERIDES: Triglycerides: 75 mg/dL (ref <150)

## 2019-12-20 LAB — D-DIMER, QUANTITATIVE: D-Dimer, Quant: 4.08 ug/mL-FEU — ABNORMAL HIGH (ref 0.00–0.50)

## 2019-12-20 MED ORDER — FAMOTIDINE IN NACL 20-0.9 MG/50ML-% IV SOLN: 20.0000 mg | Freq: Once | INTRAVENOUS | Status: DC | PRN

## 2019-12-20 MED ORDER — SODIUM CHLORIDE 0.9 % IV SOLN
500.0000 mg | INTRAVENOUS | Status: DC
  Administered 2019-12-21 – 2019-12-23 (×3): 500 mg via INTRAVENOUS
  Filled 2019-12-20 (×3): qty 500

## 2019-12-20 MED ORDER — SODIUM CHLORIDE 0.9 % IV SOLN
200.0000 mg | Freq: Once | INTRAVENOUS | Status: AC
  Administered 2019-12-20: 200 mg via INTRAVENOUS
  Filled 2019-12-20: qty 200

## 2019-12-20 MED ORDER — MECLIZINE HCL 25 MG PO TABS
12.5000 mg | ORAL_TABLET | Freq: Three times a day (TID) | ORAL | Status: DC | PRN
  Administered 2019-12-21: 12.5 mg via ORAL
  Filled 2019-12-20: qty 1
  Filled 2019-12-20: qty 0.5

## 2019-12-20 MED ORDER — SODIUM CHLORIDE 0.9 % IV SOLN: Freq: Once | INTRAVENOUS | Status: AC

## 2019-12-20 MED ORDER — ACETAMINOPHEN 500 MG PO TABS
1000.0000 mg | ORAL_TABLET | Freq: Once | ORAL | Status: AC
  Administered 2019-12-20: 1000 mg via ORAL
  Filled 2019-12-20: qty 2

## 2019-12-20 MED ORDER — METOPROLOL SUCCINATE ER 25 MG PO TB24
50.0000 mg | ORAL_TABLET | Freq: Every day | ORAL | Status: DC
  Administered 2019-12-21 – 2019-12-24 (×4): 50 mg via ORAL
  Filled 2019-12-20 (×6): qty 2

## 2019-12-20 MED ORDER — ALBUTEROL SULFATE HFA 108 (90 BASE) MCG/ACT IN AERS
1.0000 | INHALATION_SPRAY | RESPIRATORY_TRACT | Status: DC | PRN
  Filled 2019-12-20: qty 6.7

## 2019-12-20 MED ORDER — SODIUM CHLORIDE 0.9 % IV SOLN
1.0000 g | INTRAVENOUS | Status: DC
  Administered 2019-12-21 – 2019-12-23 (×3): 1 g via INTRAVENOUS
  Filled 2019-12-20 (×3): qty 10

## 2019-12-20 MED ORDER — METHYLPREDNISOLONE SODIUM SUCC 125 MG IJ SOLR: 125.0000 mg | Freq: Once | INTRAMUSCULAR | Status: DC | PRN

## 2019-12-20 MED ORDER — DEXAMETHASONE SODIUM PHOSPHATE 10 MG/ML IJ SOLN
10.0000 mg | Freq: Once | INTRAMUSCULAR | Status: AC
  Administered 2019-12-20: 10 mg via INTRAVENOUS
  Filled 2019-12-20: qty 1

## 2019-12-20 MED ORDER — AMLODIPINE BESYLATE 5 MG PO TABS
5.0000 mg | ORAL_TABLET | Freq: Every day | ORAL | Status: DC
  Administered 2019-12-21 – 2019-12-24 (×4): 5 mg via ORAL
  Filled 2019-12-20 (×5): qty 1

## 2019-12-20 MED ORDER — DEXAMETHASONE SODIUM PHOSPHATE 10 MG/ML IJ SOLN
6.0000 mg | INTRAMUSCULAR | Status: DC
  Administered 2019-12-21: 6 mg via INTRAVENOUS
  Filled 2019-12-20: qty 1

## 2019-12-20 MED ORDER — OXYCODONE HCL 5 MG PO TABS: 5.0000 mg | ORAL_TABLET | ORAL | Status: DC | PRN

## 2019-12-20 MED ORDER — POLYETHYLENE GLYCOL 3350 17 G PO PACK
17.0000 g | PACK | Freq: Every day | ORAL | Status: DC | PRN
  Filled 2019-12-20: qty 1

## 2019-12-20 MED ORDER — ASPIRIN 81 MG PO TBEC
81.0000 mg | DELAYED_RELEASE_TABLET | Freq: Every day | ORAL | Status: DC
  Administered 2019-12-21 – 2019-12-24 (×4): 81 mg via ORAL
  Filled 2019-12-20 (×8): qty 1

## 2019-12-20 MED ORDER — SODIUM CHLORIDE 0.9 % IV SOLN
500.0000 mg | Freq: Once | INTRAVENOUS | Status: AC
  Administered 2019-12-20: 500 mg via INTRAVENOUS
  Filled 2019-12-20: qty 500

## 2019-12-20 MED ORDER — SODIUM CHLORIDE 0.9 % IV SOLN
700.0000 mg | Freq: Once | INTRAVENOUS | Status: AC
  Administered 2019-12-20: 700 mg via INTRAVENOUS
  Filled 2019-12-20: qty 20

## 2019-12-20 MED ORDER — EZETIMIBE 10 MG PO TABS
10.0000 mg | ORAL_TABLET | Freq: Every day | ORAL | Status: DC
  Administered 2019-12-21 – 2019-12-24 (×4): 10 mg via ORAL
  Filled 2019-12-20 (×4): qty 1

## 2019-12-20 MED ORDER — BISACODYL 10 MG RE SUPP
10.0000 mg | Freq: Every day | RECTAL | Status: DC | PRN
  Filled 2019-12-20: qty 1

## 2019-12-20 MED ORDER — ONDANSETRON HCL 4 MG PO TABS: 4.0000 mg | ORAL_TABLET | Freq: Four times a day (QID) | ORAL | Status: DC | PRN

## 2019-12-20 MED ORDER — VITAMIN D (ERGOCALCIFEROL) 1.25 MG (50000 UNIT) PO CAPS: 50000.0000 [IU] | ORAL_CAPSULE | ORAL | Status: DC

## 2019-12-20 MED ORDER — ACETAMINOPHEN 325 MG PO TABS
650.0000 mg | ORAL_TABLET | Freq: Four times a day (QID) | ORAL | Status: DC | PRN
  Administered 2019-12-21 – 2019-12-22 (×2): 650 mg via ORAL
  Filled 2019-12-20: qty 2

## 2019-12-20 MED ORDER — INSULIN ASPART 100 UNIT/ML ~~LOC~~ SOLN
0.0000 [IU] | SUBCUTANEOUS | Status: DC
  Administered 2019-12-21: 3 [IU] via SUBCUTANEOUS

## 2019-12-20 MED ORDER — SODIUM CHLORIDE 0.9 % IV SOLN: INTRAVENOUS | Status: DC | PRN

## 2019-12-20 MED ORDER — HYDROCOD POLST-CPM POLST ER 10-8 MG/5ML PO SUER
5.0000 mL | Freq: Two times a day (BID) | ORAL | Status: DC | PRN
  Administered 2019-12-23: 5 mL via ORAL
  Filled 2019-12-20 (×2): qty 5

## 2019-12-20 MED ORDER — EPINEPHRINE 0.3 MG/0.3ML IJ SOAJ: 0.3000 mg | Freq: Once | INTRAMUSCULAR | Status: DC | PRN

## 2019-12-20 MED ORDER — ONDANSETRON HCL 4 MG/2ML IJ SOLN: 4.0000 mg | Freq: Four times a day (QID) | INTRAMUSCULAR | Status: DC | PRN

## 2019-12-20 MED ORDER — CLOPIDOGREL BISULFATE 75 MG PO TABS
75.0000 mg | ORAL_TABLET | Freq: Every day | ORAL | Status: DC
  Administered 2019-12-21 – 2019-12-24 (×4): 75 mg via ORAL
  Filled 2019-12-20 (×5): qty 1

## 2019-12-20 MED ORDER — IPRATROPIUM-ALBUTEROL 20-100 MCG/ACT IN AERS
1.0000 | INHALATION_SPRAY | Freq: Four times a day (QID) | RESPIRATORY_TRACT | Status: DC
  Administered 2019-12-21 – 2019-12-24 (×14): 1 via RESPIRATORY_TRACT
  Filled 2019-12-20: qty 4

## 2019-12-20 MED ORDER — ALBUTEROL SULFATE HFA 108 (90 BASE) MCG/ACT IN AERS: 2.0000 | INHALATION_SPRAY | Freq: Once | RESPIRATORY_TRACT | Status: DC | PRN

## 2019-12-20 MED ORDER — SODIUM CHLORIDE 0.9 % IV SOLN: 100.0000 mg | Freq: Every day | INTRAVENOUS | Status: DC

## 2019-12-20 MED ORDER — ENOXAPARIN SODIUM 40 MG/0.4ML ~~LOC~~ SOLN
40.0000 mg | SUBCUTANEOUS | Status: DC
  Administered 2019-12-20 – 2019-12-23 (×4): 40 mg via SUBCUTANEOUS
  Filled 2019-12-20 (×4): qty 0.4

## 2019-12-20 MED ORDER — NITROGLYCERIN 0.4 MG SL SUBL: 0.4000 mg | SUBLINGUAL_TABLET | SUBLINGUAL | Status: DC | PRN

## 2019-12-20 MED ORDER — SODIUM CHLORIDE 0.9 % IV SOLN
1.0000 g | Freq: Once | INTRAVENOUS | Status: AC
  Administered 2019-12-20: 1 g via INTRAVENOUS
  Filled 2019-12-20: qty 10

## 2019-12-20 MED ORDER — PANTOPRAZOLE SODIUM 40 MG PO TBEC
40.0000 mg | DELAYED_RELEASE_TABLET | Freq: Every day | ORAL | Status: DC
  Administered 2019-12-21 – 2019-12-24 (×4): 40 mg via ORAL
  Filled 2019-12-20 (×5): qty 1

## 2019-12-20 MED ORDER — GUAIFENESIN-DM 100-10 MG/5ML PO SYRP
10.0000 mL | ORAL_SOLUTION | ORAL | Status: DC | PRN
  Administered 2019-12-24: 10 mL via ORAL
  Filled 2019-12-20: qty 10

## 2019-12-20 MED ORDER — SODIUM CHLORIDE 0.9 % IV BOLUS
500.0000 mL | Freq: Once | INTRAVENOUS | Status: AC
  Administered 2019-12-20: 500 mL via INTRAVENOUS

## 2019-12-20 MED ORDER — ZINC SULFATE 220 (50 ZN) MG PO CAPS
220.0000 mg | ORAL_CAPSULE | Freq: Every day | ORAL | Status: DC
  Administered 2019-12-21 – 2019-12-24 (×4): 220 mg via ORAL
  Filled 2019-12-20 (×5): qty 1

## 2019-12-20 MED ORDER — DIPHENHYDRAMINE HCL 50 MG/ML IJ SOLN: 50.0000 mg | Freq: Once | INTRAMUSCULAR | Status: DC | PRN

## 2019-12-20 MED ORDER — ASCORBIC ACID 500 MG PO TABS
500.0000 mg | ORAL_TABLET | Freq: Every day | ORAL | Status: DC
  Administered 2019-12-21 – 2019-12-24 (×4): 500 mg via ORAL
  Filled 2019-12-20 (×5): qty 1

## 2019-12-20 NOTE — Discharge Instructions (Signed)

## 2019-12-20 NOTE — ED Provider Notes (Signed)
[facility] EMERGENCY DEPARTMENT Provider Note   CSN: [id number] Arrival date & time: 12/20/19  1526     History Chief Complaint  Patient presents with  . Shortness of Breath  . Cough    Erika Brown is a 79 y.o. female.  Patient with known coronary artery disease, follows with local cardiology team presents with worsening shortness of breath.  Patient was diagnosed tested positive for Covid on 12/17/2019.  Patient has been in the hospital with her husband who unfortunately died recently and had Covid and dementia.  Patient was hypoxic with some cyanosis around the lips during a coughing spell.  Nonrebreather weaned to nasal cannula.  No lung disease history.  Fever started past few days.        Past Medical History:  Diagnosis Date  . Anxiety and depression   . CAD (coronary artery disease)    S/P NSTEMI 4/06 EF 60%; Treated with Cypher DES to LAD and RCA with kissing balloon PCI diagonal; Treadmill myoview 10/09 for CP: Walked 7:06. mild ST-T-wave changes in recovery. EF 71%. Very mild reversible defect in base of the inferior wall --> medical rx.  . Cancer ([place])    HISTORY OF SKIN CANCER  . Cataract   . Complication of anesthesia    " DIFFICULT TO WAKE UP "  . Depression   . Glucose intolerance (impaired glucose tolerance)   . History of echocardiogram    Echo 11/17: Vigorous LVF, EF 65-70, normal wall motion, borderline diastolic function, systolic bowing of mitral valve without prolapse, mild MR, trivial TR, PASP 20  . History of medication noncompliance   . Hyperlipidemia    Intolerate to multiple statins due to severe myalgias  . Hypertension    Possible white-coat component  . MI (myocardial infarction) ([place])    acute-s/p stent  . Microhematuria   . Psoriasis   . Rapid heart beat   . Vitamin D deficiency     Patient Active Problem List   Diagnosis Date Noted  . Vitamin D deficiency, unspecified 11/07/2019  . Abnormal stress test  02/28/2018  . CAD (coronary artery disease)   . PVC's (premature ventricular contractions) 02/10/2018  . Palpitations 02/10/2018  . Pure hypercholesterolemia 01/07/2017  . SHOULDER PAIN, LEFT 02/11/2009  . Hyperlipidemia LDL goal <70 02/09/2009  . Essential hypertension 02/09/2009  . Coronary artery disease involving native coronary artery of native heart without angina pectoris 02/09/2009    Past Surgical History:  Procedure Laterality Date  . ABDOMINAL HYSTERECTOMY     and anterior repair  . APPENDECTOMY    . bladder tact    . BREAST BIOPSY    . CARDIAC CATHETERIZATION  02/28/2018  . CATARACT EXTRACTION Bilateral 2017   Dr. Katy Fitch  . CORONARY STENT INTERVENTION N/A 02/28/2018   Procedure: CORONARY STENT INTERVENTION;  Surgeon: Jettie Booze, MD;  Location: [facility];  Service: Cardiovascular;  Laterality: N/A;  . CORONARY STENT PLACEMENT    . CORONARY STENT PLACEMENT  04/02/019  . LEFT HEART CATH AND CORONARY ANGIOGRAPHY N/A 02/28/2018   Procedure: LEFT HEART CATH AND CORONARY ANGIOGRAPHY;  Surgeon: Jettie Booze, MD;  Location: [facility];  Service: Cardiovascular;  Laterality: N/A;  . MOHS SURGERY  9/14   BCC.  [facility]  . TONSILLECTOMY AND ADENOIDECTOMY    . ULTRASOUND GUIDANCE FOR VASCULAR ACCESS  02/28/2018   Procedure: Ultrasound Guidance For Vascular Access;  Surgeon: Jettie Booze, MD;  Location: [facility]  CV [facility];  Service: Cardiovascular;;     OB History    Gravida  7   Para  7   Term      Preterm      AB      Living  6     SAB      TAB      Ectopic      Multiple      Live Births           Obstetric Comments  One daughter died of melanoma        Family History  Problem Relation Age of Onset  . Heart disease Father   . Heart attack Father   . Ulcers Father   . Hypertension Mother   . Melanoma Mother   . Cancer Daughter        melanoma  . Breast cancer Daughter   . Stroke Maternal Grandmother   .  Hypertension Maternal Grandmother   . Stroke Maternal Grandfather   . Renal Disease Maternal Grandfather        kidney removed  . Hypertension Maternal Grandfather   . Diabetes Paternal Grandmother   . Melanoma Child        Metastatic  . Breast cancer Sister 81  . Hypertension Brother   . Asthma Brother   . Depression Daughter        x2  . Rectal cancer Son   . Coronary artery disease Neg Hx     Social History   Tobacco Use  . Smoking status: Former Research scientist (life sciences)  . Smokeless tobacco: Never Used  Substance Use Topics  . Alcohol use: Yes    Alcohol/week: 4.0 standard drinks    Types: 4 Glasses of wine per week  . Drug use: No    Home Medications Prior to Admission medications   Medication Sig Start Date End Date Taking? Authorizing Provider  amLODipine (NORVASC) 5 MG tablet TAKE 1 TABLET ONCE DAILY. 12/05/19   Jettie Booze, MD  aspirin 81 MG EC tablet Take 81 mg by mouth daily.      [provider]  clopidogrel (PLAVIX) 75 MG tablet Take 1 tablet (75 mg total) by mouth daily. Please keep upcoming appt with Dr. Irish Lack in January before anymore refills. Thank you 12/05/19   End, Harrell Gave, MD  Ergocalciferol (VITAMIN D2 PO) Take 1.25 mg by mouth 2 (two) times a week.    [provider]  ezetimibe (ZETIA) 10 MG tablet Take 1 tablet (10 mg total) by mouth daily. Please make yearly appt with Dr. Irish Lack for December before anymore refills. 1st attempt 10/11/19   Richardson Dopp T, PA-C  irbesartan-hydrochlorothiazide (AVALIDE) 300-12.5 MG tablet TAKE 1 TABLET ONCE DAILY. 07/02/19   End, Harrell Gave, MD  meclizine (ANTIVERT) 25 MG tablet Take 12.5 mg by mouth 3 (three) times daily as needed for dizziness.     [provider]  metoprolol succinate (TOPROL-XL) 50 MG 24 hr tablet Take 1 tablet (50 mg total) by mouth daily. 11/01/18   End, Harrell Gave, MD  nitroGLYCERIN (NITROSTAT) 0.4 MG SL tablet Place 1 tablet (0.4 mg total) under the tongue every 5 (five)  minutes as needed for chest pain. Patient not taking: Reported on 12/15/2018 03/01/18   Richardson Dopp T, PA-C  potassium chloride SA (KLOR-CON) 20 MEQ tablet Take 1 tablet (20 mEq total) by mouth daily. Please keep upcoming appt in January with Dr. Irish Lack before anymore refills. Thank you 12/05/19   End, Harrell Gave, MD  Allergies    Iodine, Latex, Other, and Statins  Review of Systems   Review of Systems  Constitutional: Positive for fatigue. Negative for chills and fever.  HENT: Negative for congestion.   Eyes: Negative for visual disturbance.  Respiratory: Positive for cough and shortness of breath.   Cardiovascular: Negative for chest pain.  Gastrointestinal: Negative for abdominal pain and vomiting.  Genitourinary: Negative for dysuria and flank pain.  Musculoskeletal: Negative for back pain, neck pain and neck stiffness.  Skin: Negative for rash.  Neurological: Negative for light-headedness and headaches.    Physical Exam Updated Vital Signs BP 106/84   Pulse (!) 122   Temp (!) 103 F (39.4 C) (Oral)   Resp 15   Ht 5\' 4"  (1.626 m)   Wt 63.5 kg   LMP 11/29/1974   SpO2 91%   BMI 24.03 kg/m   Physical Exam Vitals and nursing note reviewed.  Constitutional:      Appearance: She is well-developed.  HENT:     Head: Normocephalic and atraumatic.  Eyes:     General:        Right eye: No discharge.        Left eye: No discharge.     Conjunctiva/sclera: Conjunctivae normal.  Neck:     Trachea: No tracheal deviation.  Cardiovascular:     Rate and Rhythm: Regular rhythm. Tachycardia present.  Pulmonary:     Effort: Pulmonary effort is normal. Tachypnea present.     Breath sounds: Rhonchi present.  Abdominal:     General: There is no distension.     Palpations: Abdomen is soft.     Tenderness: There is no abdominal tenderness. There is no guarding.  Musculoskeletal:     Cervical back: Normal range of motion and neck supple.  Skin:    General: Skin is warm.      Findings: No rash.  Neurological:     Mental Status: She is alert and oriented to person, place, and time.     ED Results / Procedures / Treatments   Labs (all labs ordered are listed, but only abnormal results are displayed) Labs Reviewed  LACTIC ACID, PLASMA - Abnormal; Notable for the following components:      Result Value   Lactic Acid, Venous 2.2 (*)    All other components within normal limits  CBC WITH DIFFERENTIAL/PLATELET - Abnormal; Notable for the following components:   Lymphs Abs 0.2 (*)    All other components within normal limits  COMPREHENSIVE METABOLIC PANEL - Abnormal; Notable for the following components:   Sodium 130 (*)    Chloride 93 (*)    Glucose, Bld 157 (*)    Albumin 3.1 (*)    ALT 51 (*)    All other components within normal limits  LACTATE DEHYDROGENASE - Abnormal; Notable for the following components:   LDH 266 (*)    All other components within normal limits  CULTURE, BLOOD (ROUTINE X 2)  CULTURE, BLOOD (ROUTINE X 2)  PROCALCITONIN  LACTIC ACID, PLASMA  D-DIMER, QUANTITATIVE (NOT AT [facility])  FERRITIN  TRIGLYCERIDES  FIBRINOGEN  C-REACTIVE PROTEIN  BRAIN NATRIURETIC PEPTIDE    EKG None  Radiology DG Chest Port 1 View  Result Date: 12/20/2019 CLINICAL DATA:  Short of breath.  Cough. EXAM: PORTABLE CHEST 1 VIEW COMPARISON:  03/30/2018 and 06/12/2015.  Chest CT, 03/31/2018. FINDINGS: There are interstitial and hazy airspace opacities in the lower lungs, new since the prior exams. Upper lungs are clear. Cardiac silhouette is  normal in size. Two adjacent left coronary artery stents are noted. No mediastinal or hilar masses or evidence of adenopathy. No convincing pleural effusion and no pneumothorax. Skeletal structures are grossly intact. IMPRESSION: 1. New interstitial and hazy airspace opacities in the lower lungs. Consider multifocal pneumonia in the proper clinical setting. Findings may reflect inflammation. No evidence of pulmonary edema.  Electronically Signed   By: Lajean Manes M.D.   On: 12/20/2019 16:31    Procedures .Critical Care Performed by: Elnora Morrison, MD Authorized by: Elnora Morrison, MD   Critical care provider statement:    Critical care time (minutes):  40   Critical care start time:  12/20/2019 4:20 PM   Critical care end time:  12/20/2019 5:00 PM   Critical care time was exclusive of:  Separately billable procedures and treating other patients and teaching time   Critical care was necessary to treat or prevent imminent or life-threatening deterioration of the following conditions:  Respiratory failure   Critical care was time spent personally by me on the following activities:  Evaluation of patient's response to treatment, examination of patient, ordering and performing treatments and interventions, ordering and review of laboratory studies, ordering and review of radiographic studies, pulse oximetry, re-evaluation of patient's condition, obtaining history from patient or surrogate and review of old charts   (including critical care time)  Medications Ordered in ED Medications  acetaminophen (TYLENOL) tablet 1,000 mg (1,000 mg Oral Given 12/20/19 1655)    ED Course  I have reviewed the triage vital signs and the nursing notes.  Pertinent labs & imaging results that were available during my care of the patient were reviewed by me and considered in my medical decision making (see chart for details).    MDM Rules/Calculators/A&P                     Patient presents with clinically worsening Covid pneumonia with hypoxia.  Patient weaned off nasal cannula requiring 1 L without.  Patient's work of breathing improved in the ER.  Plan for steroids and admission to the hospital.  Blood work reviewed lactate 2.2, sodium 130.  Inflammatory markers pending.  Patient's care was signed out to follow-up continue to monitor and admit.   Erika Brown was evaluated in Emergency Department on 12/20/2019 for the  symptoms described in the history of present illness. She was evaluated in the context of the global COVID-19 pandemic, which necessitated consideration that the patient might be at risk for infection with the SARS-CoV-2 virus that causes COVID-19. Institutional protocols and algorithms that pertain to the evaluation of patients at risk for COVID-19 are in a state of rapid change based on information released by regulatory bodies including the CDC and federal and state organizations. These policies and algorithms were followed during the patient's care in the ED. Final Clinical Impression(s) / ED Diagnoses Final diagnoses:  Pneumonia due to COVID-19 virus  Hypoxia    Rx / DC Orders ED Discharge Orders    None       Elnora Morrison, MD 12/20/19 1709

## 2019-12-20 NOTE — Progress Notes (Signed)
  Diagnosis: COVID-19  Physician: Dr Joya Gaskins  Procedure: Covid Infusion Clinic Med: bamlanivimab infusion - Provided patient with bamlanimivab fact sheet for patients, parents and caregivers prior to infusion.  Complications: No immediate complications noted.  Discharge: Discharged home   Gae Bihl, Cleaster Cor[place] 12/20/2019

## 2019-12-20 NOTE — ED Triage Notes (Signed)
Per [facility] pt tested covid positive 12/17/19. Scheduled for antibody infusion today. Became hypoxic and blue around her mouth during coughing spell. Pt put on NRB by fire department, EMS switched her to [facility] en route. Pt not currently under distress

## 2019-12-20 NOTE — Telephone Encounter (Signed)
She can take OTC Vit D 2000 U daily. Thanks, BJ

## 2019-12-20 NOTE — H&P (Signed)
History and Physical    Erika Brown [id number] DOB: 03/06/1941 DOA: 12/20/2019  PCP: [place], Betty G, MD   Patient coming from: [facility]  Chief Complaint: SOB, Cough, Fevers  HPI: Erika Brown is a 79 y.o. female with medical history significant of CAD status post stenting x5, history of skin cancer, history of cataracts, depression and anxiety, history of IFG, history of hypertension, hyperlipidemia, vitamin D deficiency as well as other comorbidities who presents with a chief complaint of worsening shortness of breath, cough and fevers for last week or so.  Patient initially tested negative for COVID-19 disease last week and then started developing symptoms on Saturday with cough and worsening shortness of breath.  She developed fevers on Sunday and [facility] and [facility] tested [facility].  Of note her husband recently was admitted to [facility] long hospital and died 6 days ago and tested positive for Covid at that time as well.  She progressively got worse and was scheduled for an infusion of Bamlaniviab today.  Prior to that she had been coughing and after that she subsequently worsened her coughing and became hypoxic.  EMS was called and she was brought to the emergency room for further evaluation as she became hypoxic and blue around her mouth during her coughing spell.  She was placed on a nonrebreather by the fire department and EMS switched her to a nasal cannula in route.  She was weaned off oxygen but then ended up back on oxygen when I went to evaluate her.  Chest x-ray showed multifocal pneumonia.  TRH was asked to admit this patient for acute hypoxic respiratory failure secondary to viral pneumonia from COVID-19 disease with possible superimposed bacterial component.  ED Course: In the ED she had basic blood work done as well as an EKG, chest x-ray, was given IV steroids with Decadron and was given a 500 mL bolus and 35 mL of fluid afterwards.  She was also given empiric  antibiotics with IV ceftriaxone and IV azithromycin.  IV remdesivir was not ordered so I have ordered it.  Review of Systems: As per HPI otherwise all other systems reviewed and negative.   Past Medical History:  Diagnosis Date  . Anxiety and depression   . CAD (coronary artery disease)    S/P NSTEMI 4/06 EF 60%; Treated with Cypher DES to LAD and RCA with kissing balloon PCI diagonal; Treadmill myoview 10/09 for CP: Walked 7:06. mild ST-T-wave changes in recovery. EF 71%. Very mild reversible defect in base of the inferior wall --> medical rx.  . Cancer ([place])    HISTORY OF SKIN CANCER  . Cataract   . Complication of anesthesia    " DIFFICULT TO WAKE UP "  . Depression   . Glucose intolerance (impaired glucose tolerance)   . History of echocardiogram    Echo 11/17: Vigorous LVF, EF 65-70, normal wall motion, borderline diastolic function, systolic bowing of mitral valve without prolapse, mild MR, trivial TR, PASP 20  . History of medication noncompliance   . Hyperlipidemia    Intolerate to multiple statins due to severe myalgias  . Hypertension    Possible white-coat component  . MI (myocardial infarction) ([place])    acute-s/p stent  . Microhematuria   . Psoriasis   . Rapid heart beat   . Vitamin D deficiency    Past Surgical History:  Procedure Laterality Date  . ABDOMINAL HYSTERECTOMY     and anterior repair  . APPENDECTOMY    .  bladder tact    . BREAST BIOPSY    . CARDIAC CATHETERIZATION  02/28/2018  . CATARACT EXTRACTION Bilateral 2017   Dr. Katy Fitch  . CORONARY STENT INTERVENTION N/A 02/28/2018   Procedure: CORONARY STENT INTERVENTION;  Surgeon: Jettie Booze, MD;  Location: [facility];  Service: Cardiovascular;  Laterality: N/A;  . CORONARY STENT PLACEMENT    . CORONARY STENT PLACEMENT  04/02/019  . LEFT HEART CATH AND CORONARY ANGIOGRAPHY N/A 02/28/2018   Procedure: LEFT HEART CATH AND CORONARY ANGIOGRAPHY;  Surgeon: Jettie Booze, MD;  Location: [facility];  Service: Cardiovascular;  Laterality: N/A;  . MOHS SURGERY  9/14   BCC.  [facility]  . TONSILLECTOMY AND ADENOIDECTOMY    . ULTRASOUND GUIDANCE FOR VASCULAR ACCESS  02/28/2018   Procedure: Ultrasound Guidance For Vascular Access;  Surgeon: Jettie Booze, MD;  Location: [facility];  Service: Cardiovascular;;   SOCIAL HISTORY   reports that she has quit smoking. She has never used smokeless tobacco. She reports current alcohol use of about 4.0 standard drinks of alcohol per week. She reports that she does not use drugs.  Allergies  Allergen Reactions  . Iodine Other (See Comments)    Has had some SHOB and rapid heart rate in the past  . Latex Other (See Comments)    discomfort  . Other Other (See Comments)    Some foods b/c of preservatives   . Statins Other (See Comments)    Intolerance to high dosages, myalgia   Family History  Problem Relation Age of Onset  . Heart disease Father   . Heart attack Father   . Ulcers Father   . Hypertension Mother   . Melanoma Mother   . Cancer Daughter        melanoma  . Breast cancer Daughter   . Stroke Maternal Grandmother   . Hypertension Maternal Grandmother   . Stroke Maternal Grandfather   . Renal Disease Maternal Grandfather        kidney removed  . Hypertension Maternal Grandfather   . Diabetes Paternal Grandmother   . Melanoma Child        Metastatic  . Breast cancer Sister 78  . Hypertension Brother   . Asthma Brother   . Depression Daughter        x2  . Rectal cancer Son   . Coronary artery disease Neg Hx    Prior to Admission medications   Medication Sig Start Date End Date Taking? Authorizing Provider  amLODipine (NORVASC) 5 MG tablet TAKE 1 TABLET ONCE DAILY. 12/05/19   Jettie Booze, MD  aspirin 81 MG EC tablet Take 81 mg by mouth daily.      [provider]  clopidogrel (PLAVIX) 75 MG tablet Take 1 tablet (75 mg total) by mouth daily. Please keep upcoming appt with Dr.  Irish Lack in January before anymore refills. Thank you 12/05/19   End, Harrell Gave, MD  Ergocalciferol (VITAMIN D2 PO) Take 1.25 mg by mouth 2 (two) times a week.    [provider]  ezetimibe (ZETIA) 10 MG tablet Take 1 tablet (10 mg total) by mouth daily. Please make yearly appt with Dr. Irish Lack for December before anymore refills. 1st attempt 10/11/19   Richardson Dopp T, PA-C  irbesartan-hydrochlorothiazide (AVALIDE) 300-12.5 MG tablet TAKE 1 TABLET ONCE DAILY. 07/02/19   End, Harrell Gave, MD  meclizine (ANTIVERT) 25 MG tablet Take 12.5 mg by mouth 3 (three) times daily as needed  for dizziness.     [provider]  metoprolol succinate (TOPROL-XL) 50 MG 24 hr tablet Take 1 tablet (50 mg total) by mouth daily. 11/01/18   End, Harrell Gave, MD  nitroGLYCERIN (NITROSTAT) 0.4 MG SL tablet Place 1 tablet (0.4 mg total) under the tongue every 5 (five) minutes as needed for chest pain. Patient not taking: Reported on 12/15/2018 03/01/18   Richardson Dopp T, PA-C  potassium chloride SA (KLOR-CON) 20 MEQ tablet Take 1 tablet (20 mEq total) by mouth daily. Please keep upcoming appt in January with Dr. Irish Lack before anymore refills. Thank you 12/05/19   Nelva Bush, MD   Physical Exam: Vitals:   12/20/19 1600 12/20/19 1700 12/20/19 1800 12/20/19 1810  BP: 106/84 125/65 109/69   Pulse: (!) 122 (!) 111 (!) 103   Resp: 15 (!) 21 (!) 24   Temp:    100.2 F (37.9 C)  TempSrc:    Oral  SpO2: 91% 92% 94%   Weight:      Height:       Constitutional: Thin Caucasian female who appears uncomfortable and is coughing  Eyes: Lids and conjunctivae normal, sclerae anicteric  ENMT: External Ears, Nose appear normal. Grossly normal hearing.  Neck: Appears normal, supple, no cervical masses, normal ROM, no appreciable thyromegaly; no JVD Respiratory: Diminished to auscultation bilaterally with coarse breath sounds and some mild rhonchi; No appreciable wheezing, rales, or crackles. Slightly increased  respiratory effort and patient is not tachypenic. No accessory muscle use. Unlabored breathing wearing 2 Liters of Supplemental O2 via [place] Cardiovascular: RRR, no murmurs / rubs / gallops. S1 and S2 auscultated. No extremity edema.  Abdomen: Soft, non-tender, non-distended. Bowel sounds positive x4  GU: Deferred. Musculoskeletal: No clubbing / cyanosis of digits/nails. No joint deformity upper and lower extremities.  Skin: No rashes, lesions, ulcers on a limited skin evaluation. No induration; Warm and dry.  Neurologic: CN 2-12 grossly intact with no focal deficits. Romberg sign and cerebellar reflexes not assessed.  Psychiatric: Normal judgment and insight. Alert and oriented x 3. Normal mood and appropriate affect.   Labs on Admission: I have personally reviewed following labs and imaging studies  CBC: Recent Labs  Lab 12/20/19 1545  WBC 7.2  NEUTROABS 6.7  HGB 12.9  HCT 39.3  MCV 83.1  PLT [id number]   Basic Metabolic Panel: Recent Labs  Lab 12/20/19 1545  NA 130*  K 3.6  CL 93*  CO2 24  GLUCOSE 157*  BUN 11  CREATININE 0.89  CALCIUM 8.9   GFR: Estimated Creatinine Clearance: 43.5 mL/min (by C-G formula based on SCr of 0.89 mg/dL). Liver Function Tests: Recent Labs  Lab 12/20/19 1545  AST 41  ALT 51*  ALKPHOS 60  BILITOT 0.6  PROT 6.5  ALBUMIN 3.1*   No results for input(s): LIPASE, AMYLASE in the last 168 hours. No results for input(s): AMMONIA in the last 168 hours. Coagulation Profile: No results for input(s): INR, PROTIME in the last 168 hours. Cardiac Enzymes: No results for input(s): CKTOTAL, CKMB, CKMBINDEX, TROPONINI in the last 168 hours. BNP (last 3 results) No results for input(s): PROBNP in the last 8760 hours. HbA1C: No results for input(s): HGBA1C in the last 72 hours. CBG: No results for input(s): GLUCAP in the last 168 hours. Lipid Profile: Recent Labs    12/20/19 1545  TRIG 75   Thyroid Function Tests: No results for input(s): TSH,  T4TOTAL, FREET4, T3FREE, THYROIDAB in the last 72 hours. Anemia Panel: Recent Labs  12/20/19 1545  FERRITIN 366*   Urine analysis:    Component Value Date/Time   COLORURINE YELLOW 08/31/2016 0622   APPEARANCEUR CLEAR 08/31/2016 0622   LABSPEC 1.018 08/31/2016 0622   PHURINE 7.0 08/31/2016 0622   GLUCOSEU 250 (A) 08/31/2016 0622   HGBUR TRACE (A) 08/31/2016 0622   BILIRUBINUR N 12/15/2018 1035   KETONESUR 15 (A) 08/31/2016 0622   PROTEINUR Negative 12/15/2018 1035   PROTEINUR NEGATIVE 08/31/2016 0622   UROBILINOGEN 0.2 12/15/2018 1035   NITRITE N 12/15/2018 1035   NITRITE NEGATIVE 08/31/2016 0622   LEUKOCYTESUR Negative 12/15/2018 1035   Sepsis Labs: !!!!!!!!!!!!!!!!!!!!!!!!!!!!!!!!!!!!!!!!!!!! @LABRCNTIP (procalcitonin:4,lacticidven:4) ) Recent Results (from the past 240 hour(s))  Novel Coronavirus, NAA ([facility])     Status: None   Collection Time: 12/11/19  3:58 PM   Specimen: Nasopharyngeal(NP) swabs in vial transport medium   NASOPHARYNGE  TESTING  Result Value Ref Range Status   SARS-CoV-2, NAA Not Detected Not Detected Final    Comment: Testing was performed using the cobas(R) SARS-CoV-2 test. This nucleic acid amplification test was developed and its performance characteristics determined by [employer]. Nucleic acid amplification tests include PCR and TMA. This test has not been FDA cleared or approved. This test has been authorized by FDA under an Emergency Use Authorization (EUA). This test is only authorized for the duration of time the declaration that circumstances exist justifying the authorization of the emergency use of in vitro diagnostic tests for detection of SARS-CoV-2 virus and/or diagnosis of COVID-19 infection under section 564(b)(1) of the Act, 21 U.S.C. GF:7541899) (1), unless the authorization is terminated or revoked sooner. When diagnostic testing is negative, the possibility of a false negative result should be considered in the  context of a patient's recent exposures and the presence of clinical signs and symptoms consistent with COVID-19. An individual without symptoms  of COVID-19 and who is not shedding SARS-CoV-2 virus would expect to have a negative (not detected) result in this assay.     Radiological Exams on Admission: DG Chest Port 1 View  Result Date: 12/20/2019 CLINICAL DATA:  Short of breath.  Cough. EXAM: PORTABLE CHEST 1 VIEW COMPARISON:  03/30/2018 and 06/12/2015.  Chest CT, 03/31/2018. FINDINGS: There are interstitial and hazy airspace opacities in the lower lungs, new since the prior exams. Upper lungs are clear. Cardiac silhouette is normal in size. Two adjacent left coronary artery stents are noted. No mediastinal or hilar masses or evidence of adenopathy. No convincing pleural effusion and no pneumothorax. Skeletal structures are grossly intact. IMPRESSION: 1. New interstitial and hazy airspace opacities in the lower lungs. Consider multifocal pneumonia in the proper clinical setting. Findings may reflect inflammation. No evidence of pulmonary edema. Electronically Signed   By: Lajean Manes M.D.   On: 12/20/2019 16:31   EKG: Personally and independently reviewed.  Showed sinus tachycardia at a rate of 121 with a QTC of 389 and there is no evidence of any ST elevation or depression on my interpretation  Assessment/Plan Active Problems:   Hyperlipidemia LDL goal <70   Essential hypertension   Coronary artery disease involving native coronary artery of native heart without angina pectoris   Pure hypercholesterolemia   CAD (coronary artery disease)   Vitamin D deficiency, unspecified   Pneumonia due to COVID-19 virus   Hyponatremia   Hypochloremia  Pneumonia 2/2 to COVID-19 Virus Acute Respiratory Failure with Hypoxia in the setting of COVID-19 Disease -Admitted to Inpatient Medical Telemetry at [facility] -T-max over last 24 hours 103; she is  hemodynamically stable but does have some intermittent  dyspnea now is requiring 2 to 3 Liters of Supplemental O2 via ; initially required a NRB  -Initial CXR showed "New interstitial and hazy airspace opacities in the lower lungs. Consider multifocal pneumonia in the proper clinical setting. Findings may reflect inflammation. No evidence of pulmonary edema" -Checked Baseline Inflammatory Markers and Trend Daily: Recent Labs    12/20/19 1545  DDIMER 4.08*  FERRITIN 366*  LDH 266*  CRP 17.6*  -[facility]   Lab Results  Component Value Date   SARSCOV2NAA Not Detected 12/11/2019  -SpO2: 94 % O2 Flow Rate (L/min): 2 L/min  -Started Treatment with 5 Days of Remdesivir and 10 Days of IV Decadron; Received 10 mg of Decadron in the ED -Has already received Bamlaniviab today at outpatient [place]; Had a fit of coughing and unclear if she had bronchospasm related to this Medication   -May try Actemra and Convalescent Plasma if not improving  -C/w Antitussives with Guaifenesin-Dextromethorphan 10 mL po q4hprn Cough and Chlorpheniramine-Hydrocodone 5 mL po q12hprn -Check Blood Cx x2  -Initially received IV ceftriaxone and azithromycin in the ED. PCT was 0.85 and LA was 2.2 and repeat was 1.3; Cannot r/o Superimposed Bacterial PNA so will continue CAP Coverage  -Prone as much as possible  -C/w Zinc 220 mg po Daily and Vitamin C  -Airborne and Contact Precautions -C/w Combivent Scheduled 1 puff IH q6h and Albuterol 1-2 puff IH q4hprn -Add Flutter Valve and Incentive Spirometry  -Continue to monitor and trend inflammatory markers and repeat chest x-ray imaging in the a.m. -Will need Ambulatory Home O2 Screen prior to D/C   CAD s/p Stenting x5 with Hx of NSTEMI -Currently no CP and EKG unremarkable -C/w Clopidogrel 75 mg po Daily and ASA 81 mg po daily -C/w Metoprolol Succinate 50 mg po Daily -C/w NTG 0.4 mg SL q23minPRN -Hold Irbesartan-HCTZ currently given Hyponatremia/Hypochloremia -Check Troponin I HS -BNP was  37.1  Hyponatremia/Hypochloremia -Likely from HCTZ -Given IV 500 mL Bolus and started on NS at 75 mL/hr x12 and reassess in the AM -Continue to Montitor and Trend and repeat CMP in AM   HTN -Hold Irbesartan-HCTZ currently given Hyponatremia/Hypochloremia -C/w Amlodipine 5 mg po Daily  -Continue to Monitor BP's per Protocol   HLD -C/w Ezetimibe 10 mg po Daily   Hx of Pre-Diabetes/IFG  -Check HbA1c in the AM -Blood Sugar on Admission was 157 -Expect to [place] in the setting of IV Steroid Demargination -Initiate COVID Glucose Control Protocol and started on Sensitive Novolog SSI AC and adjust as necessary   Vitamin D Deficiency  -C/w Vitamin D Supplementation  GOC: DNR,poA  DVT prophylaxis: Enoxaparin 40 mg sq q24h  Code Status: DO NOT RESUSCITATE  Family Communication: No family present at Bedside  Disposition Plan: Pending Clinical Improvement  Consults called: None  Admission status: Admit to [facility]  Severity of Illness: The appropriate patient status for this patient is INPATIENT. Inpatient status is judged to be reasonable and necessary in order to provide the required intensity of service to ensure the patient's safety. The patient's presenting symptoms, physical exam findings, and initial radiographic and laboratory data in the context of their chronic comorbidities is felt to place them at high risk for further clinical deterioration. Furthermore, it is not anticipated that the patient will be medically stable for discharge from the hospital within 2 midnights of admission. The following factors support the patient status of inpatient.   " The  patient's presenting symptoms include Coughing, Fever, SOB. " The worrisome physical exam findings include Diminished and Coarse breath sounds " The initial radiographic and laboratory data are worrisome because of COVID+, Pnenumonia on CXR, and Elevated Inflammatory Markers. " The chronic co-morbidities are listed as above .  * I  certify that at the point of admission it is my clinical judgment that the patient will require inpatient hospital care spanning beyond 2 midnights from the point of admission due to high intensity of service, high risk for further deterioration and high frequency of surveillance required.Kerney Elbe, D.O. [facility] PAGER is on [place]  If 7PM-7AM, please contact night-coverage [email or web address]  12/20/2019, 6:56 PM

## 2019-12-21 ENCOUNTER — Ambulatory Visit (HOSPITAL_COMMUNITY): Payer: PPO

## 2019-12-21 ENCOUNTER — Inpatient Hospital Stay (HOSPITAL_COMMUNITY): Payer: PPO

## 2019-12-21 DIAGNOSIS — R0902 Hypoxemia: Secondary | ICD-10-CM

## 2019-12-21 LAB — CBC WITH DIFFERENTIAL/PLATELET
Abs Immature Granulocytes: 0.04 10*3/uL (ref 0.00–0.07)
Basophils Absolute: 0 10*3/uL (ref 0.0–0.1)
Basophils Relative: 0 %
Eosinophils Absolute: 0 10*3/uL (ref 0.0–0.5)
Eosinophils Relative: 0 %
HCT: 38.1 % (ref 36.0–46.0)
Hemoglobin: 12.3 g/dL (ref 12.0–15.0)
Immature Granulocytes: 0 %
Lymphocytes Relative: 3 %
Lymphs Abs: 0.3 10*3/uL — ABNORMAL LOW (ref 0.7–4.0)
MCH: 26.9 pg (ref 26.0–34.0)
MCHC: 32.3 g/dL (ref 30.0–36.0)
MCV: 83.2 fL (ref 80.0–100.0)
Monocytes Absolute: 0.2 10*3/uL (ref 0.1–1.0)
Monocytes Relative: 2 %
Neutro Abs: 9.1 10*3/uL — ABNORMAL HIGH (ref 1.7–7.7)
Neutrophils Relative %: 95 %
Platelets: 178 10*3/uL (ref 150–400)
RBC: 4.58 MIL/uL (ref 3.87–5.11)
RDW: 13.7 % (ref 11.5–15.5)
WBC: 9.6 10*3/uL (ref 4.0–10.5)
nRBC: 0 % (ref 0.0–0.2)

## 2019-12-21 LAB — BRAIN NATRIURETIC PEPTIDE: B Natriuretic Peptide: 89.2 pg/mL (ref 0.0–100.0)

## 2019-12-21 LAB — COMPREHENSIVE METABOLIC PANEL
ALT: 48 U/L — ABNORMAL HIGH (ref 0–44)
AST: 36 U/L (ref 15–41)
Albumin: 2.8 g/dL — ABNORMAL LOW (ref 3.5–5.0)
Alkaline Phosphatase: 56 U/L (ref 38–126)
Anion gap: 9 (ref 5–15)
BUN: 12 mg/dL (ref 8–23)
CO2: 26 mmol/L (ref 22–32)
Calcium: 8.9 mg/dL (ref 8.9–10.3)
Chloride: 99 mmol/L (ref 98–111)
Creatinine, Ser: 0.6 mg/dL (ref 0.44–1.00)
GFR calc Af Amer: >60 mL/min (ref >60)
GFR calc non Af Amer: >60 mL/min (ref >60)
Glucose, Bld: 178 mg/dL — ABNORMAL HIGH (ref 70–99)
Potassium: 3.6 mmol/L (ref 3.5–5.1)
Sodium: 134 mmol/L — ABNORMAL LOW (ref 135–145)
Total Bilirubin: 0.5 mg/dL (ref 0.3–1.2)
Total Protein: 6.4 g/dL — ABNORMAL LOW (ref 6.5–8.1)

## 2019-12-21 LAB — GLUCOSE, CAPILLARY
Glucose-Capillary: 212 mg/dL — ABNORMAL HIGH (ref 70–99)
Glucose-Capillary: 146 mg/dL — ABNORMAL HIGH (ref 70–99)
Glucose-Capillary: 252 mg/dL — ABNORMAL HIGH (ref 70–99)
Glucose-Capillary: 215 mg/dL — ABNORMAL HIGH (ref 70–99)
Glucose-Capillary: 290 mg/dL — ABNORMAL HIGH (ref 70–99)

## 2019-12-21 LAB — TROPONIN I (HIGH SENSITIVITY): Troponin I (High Sensitivity): 4 ng/L (ref <18)

## 2019-12-21 LAB — FERRITIN: Ferritin: 515 ng/mL — ABNORMAL HIGH (ref 11–307)

## 2019-12-21 LAB — HEMOGLOBIN A1C
Hgb A1c MFr Bld: 6.1 % — ABNORMAL HIGH (ref 4.8–5.6)
Mean Plasma Glucose: 128.37 mg/dL

## 2019-12-21 LAB — D-DIMER, QUANTITATIVE: D-Dimer, Quant: 3.04 ug/mL-FEU — ABNORMAL HIGH (ref 0.00–0.50)

## 2019-12-21 LAB — PROCALCITONIN: Procalcitonin: 27.43 ng/mL

## 2019-12-21 LAB — C-REACTIVE PROTEIN: CRP: 21.8 mg/dL — ABNORMAL HIGH (ref <1.0)

## 2019-12-21 LAB — SAMPLE TO BLOOD BANK

## 2019-12-21 MED ORDER — ALBUTEROL SULFATE HFA 108 (90 BASE) MCG/ACT IN AERS: 2.0000 | INHALATION_SPRAY | Freq: Four times a day (QID) | RESPIRATORY_TRACT | Status: DC

## 2019-12-21 MED ORDER — ALBUTEROL SULFATE HFA 108 (90 BASE) MCG/ACT IN AERS: 2.0000 | INHALATION_SPRAY | RESPIRATORY_TRACT | Status: DC | PRN

## 2019-12-21 MED ORDER — BENZONATATE 100 MG PO CAPS
200.0000 mg | ORAL_CAPSULE | Freq: Three times a day (TID) | ORAL | Status: DC
  Administered 2019-12-21 – 2019-12-24 (×10): 200 mg via ORAL
  Filled 2019-12-21 (×10): qty 2

## 2019-12-21 MED ORDER — INSULIN ASPART 100 UNIT/ML ~~LOC~~ SOLN
0.0000 [IU] | Freq: Three times a day (TID) | SUBCUTANEOUS | Status: DC
  Administered 2019-12-21: 3 [IU] via SUBCUTANEOUS
  Administered 2019-12-21: 1 [IU] via SUBCUTANEOUS
  Administered 2019-12-21 – 2019-12-22 (×2): 5 [IU] via SUBCUTANEOUS
  Administered 2019-12-22 (×2): 2 [IU] via SUBCUTANEOUS
  Administered 2019-12-23: 3 [IU] via SUBCUTANEOUS
  Administered 2019-12-23: 7 [IU] via SUBCUTANEOUS
  Administered 2019-12-23: 5 [IU] via SUBCUTANEOUS

## 2019-12-21 MED ORDER — SODIUM CHLORIDE 0.9 % IV SOLN
100.0000 mg | Freq: Every day | INTRAVENOUS | Status: DC
  Administered 2019-12-21 – 2019-12-24 (×4): 100 mg via INTRAVENOUS
  Filled 2019-12-21 (×4): qty 20

## 2019-12-21 NOTE — Plan of Care (Signed)
  Problem: Education: Goal: Knowledge of General Education information will improve Description: Including pain rating scale, medication(s)/side effects and non-pharmacologic comfort measures Outcome: Progressing   Problem: Health Behavior/Discharge Planning: Goal: Ability to manage health-related needs will improve Outcome: Progressing   Problem: Clinical Measurements: Goal: Ability to maintain clinical measurements within normal limits will improve Outcome: Progressing Goal: Will remain free from infection Outcome: Progressing Goal: Diagnostic test results will improve Outcome: Progressing Goal: Respiratory complications will improve Outcome: Progressing Goal: Cardiovascular complication will be avoided Outcome: Progressing   Problem: Activity: Goal: Risk for activity intolerance will decrease Outcome: Progressing   Problem: Nutrition: Goal: Adequate nutrition will be maintained Outcome: Progressing   Problem: Coping: Goal: Level of anxiety will decrease Outcome: Progressing  Problem: Safety: Goal: Ability to remain free from injury will improve Outcome: Progressing   Problem: Skin Integrity: Goal: Risk for impaired skin integrity will decrease Outcome: Progressing   Problem: Education: Goal: Ability to state activities that reduce stress will improve Outcome: Progressing   Problem: Coping: Goal: Ability to identify and develop effective coping behavior will improve Outcome: Progressing   Problem: Coping: Goal: Psychosocial and spiritual needs will be supported Outcome: Progressing   Problem: Respiratory: Goal: Will maintain a patent airway Outcome: Progressing Goal: Complications related to the disease process, condition or treatment will be avoided or minimized Outcome: Progressing

## 2019-12-21 NOTE — Telephone Encounter (Signed)
Pt is currently in ED.

## 2019-12-21 NOTE — Evaluation (Signed)
Physical Therapy Evaluation Patient Details Name: Erika Brown MRN: [id number] DOB: 02/29/1941 Today's Date: 12/21/2019   History of Present Illness  79 y.o. female with medical history significant of CAD status post stenting x5, history of skin cancer, history of cataracts, depression and anxiety, history of IFG, history of hypertension, hyperlipidemia, vitamin D deficiency as well as other comorbidities who presents with a chief complaint of worsening shortness of breath, cough and fevers for last week or so.  Clinical Impression   Pt admitted with above hx and dx. PTA was living home with spouse and was very independent, she states she has 14 steps at home and goes up and down at least 30 times a day. 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 Pt's daughter was staying with her prior to hospitalization, daughter has also tested positive for COVID. This pm pt stated she was lethargic but was agreeable to assessment and tx. Pt needed mod I with bed mob and SBA with transfers, she was able to ambulate in room approx 41ft with no AD and SBA/min guard assist, she was also able to ambulate approx 146ft with no AD and SBA/min guard assist in hall. Pt was on 2L/min via [place] and was noted to desat to min of 85% with ambulation. Pt has been educated on use of incentive spirometer and also flutter valve. Pt will greatly benefit from skilled acute care PT tx while in hospital, at d/c if she continues to progress at this level will not need any further skilled PT follow up.     Follow Up Recommendations No PT follow up    Equipment Recommendations  None recommended by PT    Recommendations for Other Services OT consult     Precautions / Restrictions Restrictions Weight Bearing Restrictions: No      Mobility  Bed Mobility Overal bed mobility: Modified Independent                Transfers Overall transfer level: Needs  assistance Equipment used: None Transfers: Sit to/from Stand;Stand Pivot Transfers Sit to Stand: Supervision Stand pivot transfers: Supervision          Ambulation/Gait Ambulation/Gait assistance: Supervision;Min guard Gait Distance (Feet): 175 Feet Assistive device: None Gait Pattern/deviations: Step-through pattern     General Gait Details: ambulted 1 x 52 ft with SBA in room and 165ft in hall with SBA Pt was on 2L/min via [place] and desat to min of 85% when ambulating  Stairs            Wheelchair Mobility    Modified Rankin (Stroke Patients Only)       Balance Overall balance assessment: Mild deficits observed, not formally tested                                           Pertinent Vitals/Pain Pain Assessment: No/denies pain    Home Living Family/patient expects to be discharged to:: Private residence Living Arrangements: Alone;Children Available Help at Discharge: Family Type of Home: House       Home Layout: Two level Home Equipment: [place] - 2 wheels;Bedside commode      Prior Function Level of Independence: Independent               Hand Dominance        Extremity/Trunk Assessment  Upper Extremity Assessment Upper Extremity Assessment: Generalized weakness    Lower Extremity Assessment Lower Extremity Assessment: Generalized weakness       Communication   Communication: No difficulties  Cognition Arousal/Alertness: Awake/alert Behavior During Therapy: WFL for tasks assessed/performed Overall Cognitive Status: Within Functional Limits for tasks assessed                                        General Comments      Exercises Other Exercises Other Exercises: reinforced use of incentive spirometer x10 pulls 216ml Other Exercises: reinforced use of flutter valve x 10   Assessment/Plan    PT Assessment Patient needs continued PT services  PT Problem List Decreased strength;Decreased  activity tolerance;Decreased balance       PT Treatment Interventions Gait training;Functional mobility training;Therapeutic activities;Therapeutic exercise;Balance training;Neuromuscular re-education    PT Goals (Current goals can be found in the Care Plan section)  Acute Rehab PT Goals Patient Stated Goal: to go home soon PT Goal Formulation: With patient Time For Goal Achievement: 01/04/20 Potential to Achieve Goals: Good    Frequency Min 3X/week   Barriers to discharge        Co-evaluation               AM-PAC PT "6 Clicks" Mobility  Outcome Measure Help needed turning from your back to your side while in a flat bed without using bedrails?: None Help needed moving from lying on your back to sitting on the side of a flat bed without using bedrails?: None Help needed moving to and from a bed to a chair (including a wheelchair)?: A Little Help needed standing up from a chair using your arms (e.g., wheelchair or bedside chair)?: A Little Help needed to walk in hospital room?: A Little Help needed climbing 3-5 steps with a railing? : A Lot 6 Click Score: 19    End of Session Equipment Utilized During Treatment: Oxygen Activity Tolerance: Patient limited by fatigue;Patient limited by lethargy;Treatment limited secondary to medical complications (Comment) Patient left: in bed;with call bell/phone within reach Nurse Communication: Mobility status PT Visit Diagnosis: Other abnormalities of gait and mobility (R26.89);Muscle weakness (generalized) (M62.81)    Time: [id number] PT Time Calculation (min) (ACUTE ONLY): 41 min   Charges:   PT Evaluation $PT Eval Moderate Complexity: 1 Mod PT Treatments $Gait Training: 8-22 mins $Therapeutic Activity: 8-22 mins        Horald Chestnut, PT   Delford Field 12/21/2019, 4:58 PM

## 2019-12-21 NOTE — Plan of Care (Signed)
Patient admitted to 170 via transport services, she is alert and oriented, no visible respiratory distress, vital signs are stable, 4L of O2, however increased to 6L when she ambulates to the [facility], stand by assistance.  Patient able to provide information to complete her admission documentation, personal belongings inventoried, Updated on plan of care, tele monitoring verified, Call light within reach, patient educated on how to reach nurse when needed. Will continue to monitor for acute changes.    Problem: Education: Goal: Knowledge of General Education information will improve Description: Including pain rating scale, medication(s)/side effects and non-pharmacologic comfort measures Outcome: Progressing   Problem: Health Behavior/Discharge Planning: Goal: Ability to manage health-related needs will improve Outcome: Progressing   Problem: Clinical Measurements: Goal: Ability to maintain clinical measurements within normal limits will improve Outcome: Progressing Goal: Will remain free from infection Outcome: Progressing Goal: Diagnostic test results will improve Outcome: Progressing Goal: Respiratory complications will improve Outcome: Progressing Goal: Cardiovascular complication will be avoided Outcome: Progressing   Problem: Activity: Goal: Risk for activity intolerance will decrease Outcome: Progressing   Problem: Nutrition: Goal: Adequate nutrition will be maintained Outcome: Progressing   Problem: Coping: Goal: Level of anxiety will decrease Outcome: Progressing   Problem: Elimination: Goal: Will not experience complications related to bowel motility Outcome: Progressing Goal: Will not experience complications related to urinary retention Outcome: Progressing   Problem: Pain Managment: Goal: General experience of comfort will improve Outcome: Progressing   Problem: Safety: Goal: Ability to remain free from injury will improve Outcome: Progressing   Problem:  Skin Integrity: Goal: Risk for impaired skin integrity will decrease Outcome: Progressing   Problem: Education: Goal: Ability to state activities that reduce stress will improve Outcome: Progressing   Problem: Coping: Goal: Ability to identify and develop effective coping behavior will improve Outcome: Progressing   Problem: Self-Concept: Goal: Ability to identify factors that promote anxiety will improve Outcome: Progressing Goal: Level of anxiety will decrease Outcome: Progressing Goal: Ability to modify response to factors that promote anxiety will improve Outcome: Progressing   Problem: Education: Goal: Knowledge of risk factors and measures for prevention of condition will improve Outcome: Progressing   Problem: Coping: Goal: Psychosocial and spiritual needs will be supported Outcome: Progressing   Problem: Respiratory: Goal: Will maintain a patent airway Outcome: Progressing Goal: Complications related to the disease process, condition or treatment will be avoided or minimized Outcome: Progressing

## 2019-12-21 NOTE — Progress Notes (Signed)
PHARMACY - PHYSICIAN COMMUNICATION CRITICAL VALUE ALERT - BLOOD CULTURE IDENTIFICATION (BCID)  [name] is an 79 y.o. female who presented to [facility] on 12/20/2019 with a chief complaint of COVID  Assessment:  [name] is here for her COVID. Lab called with cultures result. They said the gram stain showed 1/4 bottles with GPC clusters. Since it's just one bottle, they will not do BCID. Will messaged to let MD knows. Likely to be contaminant. MD will just watch on current therapy.   Name of physician (or Provider) Contacted: Dr. Nena Alexander  Current antibiotics: Ceftriaxone/azith  Changes to prescribed antibiotics recommended:  No change  Onnie Boer, PharmD, [place], AAHIVP, CPP Infectious Disease Pharmacist 12/21/2019 3:11 PM

## 2019-12-21 NOTE — Progress Notes (Signed)
PROGRESS NOTE                                                                                                                                                                                                             Patient Demographics:    Erika Brown, is a 79 y.o. female, DOB - 1941/11/03, [id number]  Outpatient Primary MD for the patient is Brown, Erika G, MD   Admit date - 12/20/2019   LOS - 1  Chief Complaint  Patient presents with  . Shortness of Breath  . Cough       Brief Narrative: Patient is a 79 y.o. female with PMHx of CAD s/p PCI, depression, anxiety, HTN, HLD who presented to the ED with perioral cyanosis after a coughing spell-was found to have acute hypoxic respiratory failure secondary to COVID-19 pneumonia.  Her husband recently passed away from COVID-74.  Significant events: 1/21>>bamlanivimab infusion (just prior to this hospitalization)   Subjective:    Erika Brown today feels slightly better today.  She was on 6 L of oxygen this morning-but was titrated down to around 3 L.   Assessment  & Plan :   Acute Hypoxic Resp Failure due to Covid 19 Viral pneumonia and concurrent bacterial pneumonia: Oxygen requirements are better-she was down to 3-4 L of oxygen (was on 6 L overnight).  Her CRP and procalcitonin significantly higher today-plans are to continue steroids/remdesivir and empiric Rocephin/Zithromax.  She is not a candidate for Actemra given significantly elevated procalcitonin.    Fever: afebrile  O2 requirements:  SpO2: 99 % O2 Flow Rate (L/min): 4 L/min   COVID-19 Labs: Recent Labs    12/20/19 1545 12/21/19 0706  DDIMER 4.08* 3.04*  FERRITIN 366* 515*  LDH 266*  --   CRP 17.6* 21.8*       Component Value Date/Time   BNP 89.2 12/21/2019 0706    Recent Labs  Lab 12/20/19 1545 12/21/19 0706  PROCALCITON 0.85 27.43    Lab Results  Component Value Date   SARSCOV2NAA POSITIVE (A) 12/20/2019   Reasnor Not Detected 12/11/2019     COVID-19 Medications: Steroids: 1/21>> Remdesivir: 1/21  Other medications: Diuretics:Euvolemic-no need for lasix Antibiotics: Rocephin: 1/21>> Zithromax: 1/21>>  Prone/Incentive Spirometry: encouraged  incentive spirometry use 3-4/hour.  DVT Prophylaxis  :  Lovenox   CAD: No anginal symptoms-continue aspirin/Plavix/metoprolol.  HTN: Controlled-continue amlodipine-continue to hold Avapro/HCTZ  HLD: Continue Zetia  History of prediabetes: Monitor CBGs closely on SSI.  CBG (last 3)  Recent Labs    12/20/19 2002 12/21/19 0142 12/21/19 0825  GLUCAP 110* 212* 146*   Consults  :  None  Procedures  :  None  ABG: No results found for: PHART, PCO2ART, PO2ART, HCO3, TCO2, ACIDBASEDEF, O2SAT  Vent Settings: N/A   Condition - Extremely Guarded  Family Communication  : Left a voicemail for daughter  Code Status :  DNR Diet :  Diet Order            Diet Heart Room service appropriate? Yes; Fluid consistency: Thin  Diet effective now               Disposition Plan  :  Remain hospitalized-probably home with home health services when ready for discharge  Barriers to discharge: Hypoxia requiring O2 supplementation/complete 5 days of IV Remdesivir/IV antibiotics  Antimicorbials  :    Anti-infectives (From admission, onward)   Start     Dose/Rate Route Frequency Ordered Stop   12/21/19 2000  cefTRIAXone (ROCEPHIN) 1 g in sodium chloride 0.9 % 100 mL IVPB     1 g 200 mL/hr over 30 Minutes Intravenous Every 24 hours 12/20/19 1839     12/21/19 2000  remdesivir 100 mg in sodium chloride 0.9 % 100 mL IVPB  Status:  Discontinued     100 mg 200 mL/hr over 30 Minutes Intravenous Daily 12/20/19 1907 12/21/19 0931   12/21/19 1800  azithromycin (ZITHROMAX) 500 mg in sodium chloride 0.9 % 250 mL IVPB     500 mg 250 mL/hr over 60 Minutes Intravenous Every 24 hours 12/20/19 1839     12/21/19 1000   remdesivir 100 mg in sodium chloride 0.9 % 100 mL IVPB     100 mg 200 mL/hr over 30 Minutes Intravenous Daily 12/21/19 0931     12/20/19 2000  remdesivir 200 mg in sodium chloride 0.9% 250 mL IVPB     200 mg 580 mL/hr over 30 Minutes Intravenous Once 12/20/19 1907 12/20/19 2258   12/20/19 1730  cefTRIAXone (ROCEPHIN) 1 g in sodium chloride 0.9 % 100 mL IVPB     1 g 200 mL/hr over 30 Minutes Intravenous  Once 12/20/19 1722 12/20/19 1940   12/20/19 1730  azithromycin (ZITHROMAX) 500 mg in sodium chloride 0.9 % 250 mL IVPB     500 mg 250 mL/hr over 60 Minutes Intravenous  Once 12/20/19 1722 12/20/19 1911      Inpatient Medications  Scheduled Meds: . amLODipine  5 mg Oral Daily  . vitamin C  500 mg Oral Daily  . aspirin  81 mg Oral Daily  . benzonatate  200 mg Oral TID  . clopidogrel  75 mg Oral Daily  . dexamethasone (DECADRON) injection  6 mg Intravenous Q24H  . enoxaparin (LOVENOX) injection  40 mg Subcutaneous Q24H  . ezetimibe  10 mg Oral Daily  . insulin aspart  0-9 Units Subcutaneous TID WC  . Ipratropium-Albuterol  1 puff Inhalation Q6H  . metoprolol succinate  50 mg Oral Daily  . pantoprazole  40 mg Oral Daily  . [START ON 12/24/2019] Vitamin D (Ergocalciferol)  50,000 Units Oral Once per day on Mon Thu  . zinc sulfate  220 mg Oral Daily   Continuous Infusions: . azithromycin    . cefTRIAXone (ROCEPHIN)  IV    . remdesivir 100 mg in NS 100 mL 100 mg (  12/21/19 1108)   PRN Meds:.acetaminophen, albuterol, bisacodyl, chlorpheniramine-HYDROcodone, guaiFENesin-dextromethorphan, meclizine, nitroGLYCERIN, ondansetron **OR** ondansetron (ZOFRAN) IV, oxyCODONE, polyethylene glycol   Time Spent in minutes  25  Oren Binet M.D on 12/21/2019 at 12:16 PM  To page go to [email or web address] - use universal password  [facility] -  Office  [phone]    Objective:   Vitals:   12/20/19 2300 12/20/19 2313 12/20/19 2343 12/21/19 0347  BP:   108/68 127/67  Pulse: 77  71 86    Resp: (!) 21  (!) 21 20  Temp:  98.1 F (36.7 C)    TempSrc:  Oral    SpO2: 96%  97% 99%  Weight:      Height:        Wt Readings from Last 3 Encounters:  12/20/19 63.5 kg  11/07/19 66.2 kg  12/15/18 64.6 kg     Intake/Output Summary (Last 24 hours) at 12/21/2019 1216 Last data filed at 12/21/2019 0400 Gross per 24 hour  Intake 500 ml  Output 1 ml  Net 499 ml     Physical Exam Gen Exam:Alert awake-not in any distress HEENT:atraumatic, normocephalic Chest: B/L clear to auscultation anteriorly CVS:S1S2 regular Abdomen:soft non tender, non distended Extremities:no edema Neurology: Non focal Skin: no rash   Data Review:    CBC Recent Labs  Lab 12/20/19 1545 12/21/19 0706  WBC 7.2 9.6  HGB 12.9 12.3  HCT 39.3 38.1  PLT 159 178  MCV 83.1 83.2  MCH 27.3 26.9  MCHC 32.8 32.3  RDW 13.4 13.7  LYMPHSABS 0.2* 0.3*  MONOABS 0.2 0.2  EOSABS 0.0 0.0  BASOSABS 0.0 0.0    Chemistries  Recent Labs  Lab 12/20/19 1545 12/21/19 0706  NA 130* 134*  K 3.6 3.6  CL 93* 99  CO2 24 26  GLUCOSE 157* 178*  BUN 11 12  CREATININE 0.89 0.60  CALCIUM 8.9 8.9  AST 41 36  ALT 51* 48*  ALKPHOS 60 56  BILITOT 0.6 0.5   ------------------------------------------------------------------------------------------------------------------ Recent Labs    12/20/19 1545  TRIG 75    No results found for: HGBA1C ------------------------------------------------------------------------------------------------------------------ No results for input(s): TSH, T4TOTAL, T3FREE, THYROIDAB in the last 72 hours.  Invalid input(s): FREET3 ------------------------------------------------------------------------------------------------------------------ Recent Labs    12/20/19 1545 12/21/19 0706  FERRITIN 366* 515*    Coagulation profile No results for input(s): INR, PROTIME in the last 168 hours.  Recent Labs    12/20/19 1545 12/21/19 0706  DDIMER 4.08* 3.04*    Cardiac  Enzymes No results for input(s): CKMB, TROPONINI, MYOGLOBIN in the last 168 hours.  Invalid input(s): CK ------------------------------------------------------------------------------------------------------------------    Component Value Date/Time   BNP 89.2 12/21/2019 0706    Micro Results Recent Results (from the past 240 hour(s))  Novel Coronavirus, NAA ([facility])     Status: None   Collection Time: 12/11/19  3:58 PM   Specimen: Nasopharyngeal(NP) swabs in vial transport medium   NASOPHARYNGE  TESTING  Result Value Ref Range Status   SARS-CoV-2, NAA Not Detected Not Detected Final    Comment: Testing was performed using the cobas(R) SARS-CoV-2 test. This nucleic acid amplification test was developed and its performance characteristics determined by [employer]. Nucleic acid amplification tests include PCR and TMA. This test has not been FDA cleared or approved. This test has been authorized by FDA under an Emergency Use Authorization (EUA). This test is only authorized for the duration of time the declaration that circumstances exist justifying the authorization of the emergency use of  in vitro diagnostic tests for detection of SARS-CoV-2 virus and/or diagnosis of COVID-19 infection under section 564(b)(1) of the Act, 21 U.S.C. [id number]) (1), unless the authorization is terminated or revoked sooner. When diagnostic testing is negative, the possibility of a false negative result should be considered in the context of a patient's recent exposures and the presence of clinical signs and symptoms consistent with COVID-19. An individual without symptoms  of COVID-19 and who is not shedding SARS-CoV-2 virus would expect to have a negative (not detected) result in this assay.   Blood Culture (routine x 2)     Status: None (Preliminary result)   Collection Time: 12/20/19  4:12 PM   Specimen: BLOOD  Result Value Ref Range Status   Specimen Description BLOOD LEFT  ANTECUBITAL  Final   Special Requests   Final    BOTTLES DRAWN AEROBIC AND ANAEROBIC Blood Culture adequate volume   Culture   Final    NO GROWTH < 24 HOURS Performed at [facility], [place] [address], [place], [place] [address]    Report Status PENDING  Incomplete  Blood Culture (routine x 2)     Status: None (Preliminary result)   Collection Time: 12/20/19  6:06 PM   Specimen: BLOOD LEFT HAND  Result Value Ref Range Status   Specimen Description BLOOD LEFT HAND  Final   Special Requests   Final    BOTTLES DRAWN AEROBIC AND ANAEROBIC Blood Culture adequate volume   Culture   Final    NO GROWTH < 24 HOURS Performed at [facility], [place] [address], [place], [place] [address]    Report Status PENDING  Incomplete  Respiratory Panel by RT PCR (Flu A&B, Covid) - Nasopharyngeal Swab     Status: Abnormal   Collection Time: 12/20/19  6:12 PM   Specimen: Nasopharyngeal Swab  Result Value Ref Range Status   SARS Coronavirus 2 by RT PCR POSITIVE (A) NEGATIVE Final    Comment: RESULT CALLED TO, READ BACK BY AND VERIFIED WITH: K SPRANGERS RN 12/20/19 JDW 2035 (NOTE) SARS-CoV-2 target nucleic acids are DETECTED. SARS-CoV-2 RNA is generally detectable in upper respiratory specimens  during the acute phase of infection. Positive results are indicative of the presence of the identified virus, but do not rule out bacterial infection or co-infection with other pathogens not detected by the test. Clinical correlation with patient history and other diagnostic information is necessary to determine patient infection status. The expected result is Negative. Fact Sheet for Patients:  PinkCheek.be Fact Sheet for Healthcare Providers: GravelBags.it This test is not yet approved or cleared by the [place] FDA and  has been authorized for detection and/or diagnosis of SARS-CoV-2 by FDA under an Emergency Use Authorization (EUA).   This EUA will remain in effect (meaning this test can be used) for t he duration of  the COVID-19 declaration under Section 564(b)(1) of the Act, 21 U.S.C. section 360bbb-3(b)(1), unless the authorization is terminated or revoked sooner.    Influenza A by PCR NEGATIVE NEGATIVE Final   Influenza B by PCR NEGATIVE NEGATIVE Final    Comment: (NOTE) The Xpert Xpress SARS-CoV-2/FLU/RSV assay is intended as an aid in  the diagnosis of influenza from Nasopharyngeal swab specimens and  should not be used as a sole basis for treatment. Nasal washings and  aspirates are unacceptable for Xpert Xpress SARS-CoV-2/FLU/RSV  testing. Fact Sheet for Patients: PinkCheek.be Fact Sheet for Healthcare Providers: GravelBags.it This test is not yet approved or cleared by  the [employer] and  has been authorized for detection and/or diagnosis of SARS-CoV-2 by  FDA under an Emergency Use Authorization (EUA). This EUA will remain  in effect (meaning this test can be used) for the duration of the  Covid-19 declaration under Section 564(b)(1) of the Act, 21  U.S.C. section 360bbb-3(b)(1), unless the authorization is  terminated or revoked. Performed at [facility], [place] [address], [place], [place] [address]     Radiology Reports Portable chest 1 View  Result Date: 12/21/2019 CLINICAL DATA:  Shortness of breath. EXAM: PORTABLE CHEST 1 VIEW COMPARISON:  12/20/2019.  CT 03/31/2018. FINDINGS: Mediastinum and hilar structures are unremarkable. Heart size normal. Coronary stents noted. Multifocal bilateral pulmonary infiltrates are again noted without interim change. Bibasilar atelectasis. No pleural effusion or pneumothorax. No acute bony abnormality. IMPRESSION: Multifocal bilateral pulmonary infiltrates are again noted without interim change. Bibasilar atelectasis. Electronically Signed   By: Marcello Moores  Register   On: 12/21/2019 07:04   DG Chest  Port 1 View  Result Date: 12/20/2019 CLINICAL DATA:  Short of breath.  Cough. EXAM: PORTABLE CHEST 1 VIEW COMPARISON:  03/30/2018 and 06/12/2015.  Chest CT, 03/31/2018. FINDINGS: There are interstitial and hazy airspace opacities in the lower lungs, new since the prior exams. Upper lungs are clear. Cardiac silhouette is normal in size. Two adjacent left coronary artery stents are noted. No mediastinal or hilar masses or evidence of adenopathy. No convincing pleural effusion and no pneumothorax. Skeletal structures are grossly intact. IMPRESSION: 1. New interstitial and hazy airspace opacities in the lower lungs. Consider multifocal pneumonia in the proper clinical setting. Findings may reflect inflammation. No evidence of pulmonary edema. Electronically Signed   By: Lajean Manes M.D.   On: 12/20/2019 16:31

## 2019-12-22 LAB — CBC WITH DIFFERENTIAL/PLATELET
Abs Immature Granulocytes: 0.04 10*3/uL (ref 0.00–0.07)
Basophils Absolute: 0 10*3/uL (ref 0.0–0.1)
Basophils Relative: 0 %
Eosinophils Absolute: 0 10*3/uL (ref 0.0–0.5)
Eosinophils Relative: 0 %
HCT: 37.8 % (ref 36.0–46.0)
Hemoglobin: 12.1 g/dL (ref 12.0–15.0)
Immature Granulocytes: 0 %
Lymphocytes Relative: 6 %
Lymphs Abs: 0.6 10*3/uL — ABNORMAL LOW (ref 0.7–4.0)
MCH: 26.7 pg (ref 26.0–34.0)
MCHC: 32 g/dL (ref 30.0–36.0)
MCV: 83.4 fL (ref 80.0–100.0)
Monocytes Absolute: 0.3 10*3/uL (ref 0.1–1.0)
Monocytes Relative: 3 %
Neutro Abs: 9.1 10*3/uL — ABNORMAL HIGH (ref 1.7–7.7)
Neutrophils Relative %: 91 %
Platelets: 245 10*3/uL (ref 150–400)
RBC: 4.53 MIL/uL (ref 3.87–5.11)
RDW: 14 % (ref 11.5–15.5)
WBC: 10.1 10*3/uL (ref 4.0–10.5)
nRBC: 0 % (ref 0.0–0.2)

## 2019-12-22 LAB — COMPREHENSIVE METABOLIC PANEL
ALT: 53 U/L — ABNORMAL HIGH (ref 0–44)
AST: 38 U/L (ref 15–41)
Albumin: 2.8 g/dL — ABNORMAL LOW (ref 3.5–5.0)
Alkaline Phosphatase: 69 U/L (ref 38–126)
Anion gap: 12 (ref 5–15)
BUN: 22 mg/dL (ref 8–23)
CO2: 25 mmol/L (ref 22–32)
Calcium: 9.4 mg/dL (ref 8.9–10.3)
Chloride: 99 mmol/L (ref 98–111)
Creatinine, Ser: 0.81 mg/dL (ref 0.44–1.00)
GFR calc Af Amer: >60 mL/min (ref >60)
GFR calc non Af Amer: >60 mL/min (ref >60)
Glucose, Bld: 276 mg/dL — ABNORMAL HIGH (ref 70–99)
Potassium: 3.5 mmol/L (ref 3.5–5.1)
Sodium: 136 mmol/L (ref 135–145)
Total Bilirubin: 0.3 mg/dL (ref 0.3–1.2)
Total Protein: 6.4 g/dL — ABNORMAL LOW (ref 6.5–8.1)

## 2019-12-22 LAB — D-DIMER, QUANTITATIVE: D-Dimer, Quant: 2.89 ug/mL-FEU — ABNORMAL HIGH (ref 0.00–0.50)

## 2019-12-22 LAB — C-REACTIVE PROTEIN: CRP: 19.3 mg/dL — ABNORMAL HIGH (ref <1.0)

## 2019-12-22 LAB — GLUCOSE, CAPILLARY
Glucose-Capillary: 184 mg/dL — ABNORMAL HIGH (ref 70–99)
Glucose-Capillary: 329 mg/dL — ABNORMAL HIGH (ref 70–99)
Glucose-Capillary: 287 mg/dL — ABNORMAL HIGH (ref 70–99)
Glucose-Capillary: 253 mg/dL — ABNORMAL HIGH (ref 70–99)

## 2019-12-22 LAB — FERRITIN: Ferritin: 663 ng/mL — ABNORMAL HIGH (ref 11–307)

## 2019-12-22 MED ORDER — METHYLPREDNISOLONE SODIUM SUCC 40 MG IJ SOLR
40.0000 mg | Freq: Two times a day (BID) | INTRAMUSCULAR | Status: DC
  Administered 2019-12-22 – 2019-12-23 (×3): 40 mg via INTRAVENOUS
  Filled 2019-12-22 (×3): qty 1

## 2019-12-22 MED ORDER — VITAMIN D (ERGOCALCIFEROL) 1.25 MG (50000 UNIT) PO CAPS
50000.0000 [IU] | ORAL_CAPSULE | ORAL | Status: DC
  Filled 2019-12-22: qty 1

## 2019-12-22 MED ORDER — VITAMIN D (ERGOCALCIFEROL) 1.25 MG (50000 UNIT) PO CAPS
50000.0000 [IU] | ORAL_CAPSULE | Freq: Once | ORAL | Status: AC
  Administered 2019-12-22: 50000 [IU] via ORAL
  Filled 2019-12-22: qty 1

## 2019-12-22 NOTE — Evaluation (Signed)
Occupational Therapy Evaluation Patient Details Name: Erika Brown MRN: [id number] DOB: 18-Mar-1941 Today's Date: 12/22/2019    History of Present Illness 79 y.o. female with medical history significant of CAD status post stenting x5, history of skin cancer, history of cataracts, depression and anxiety, history of IFG, history of hypertension, hyperlipidemia, vitamin D deficiency as well as other comorbidities who presents with a chief complaint of worsening shortness of breath, cough and fevers for last week or so.   Clinical Impression   PTA, pt was living with her husband and was independent and very active; pt's husband recently passed away (December 30, 2019) and pt reports she has good family support from her children. Pt performing ADLs and functional mobility at Supervision level. Pt very motivated to participate in therapy. SpO2 96-87% on 2L. Pt would benefit from further acute OT to facilitate safe dc. Recommend dc to home once medically stable per physician.       Follow Up Recommendations  No OT follow up;Supervision - Intermittent    Equipment Recommendations  None recommended by OT    Recommendations for Other Services PT consult     Precautions / Restrictions Restrictions Weight Bearing Restrictions: No      Mobility Bed Mobility               General bed mobility comments: In recliner upon arrival  Transfers Overall transfer level: Needs assistance Equipment used: None Transfers: Sit to/from [facility] Sit to Stand: Supervision         General transfer comment: Supervision for safety    Balance Overall balance assessment: No apparent balance deficits (not formally assessed)                                         ADL either performed or assessed with clinical judgement   ADL Overall ADL's : Needs assistance/impaired                                       General ADL Comments: Pt performing ADLs and  functional mobility at Supervision level. Pt performing oral care at sink and functional mobility in hallway. Pt SpO2 dropping to 87% on 2L O2. Pt able to take standing rest breaks and quickly reocver back to 90s. Pt wanting to shower later today. Discussed shower safety such as wearing her O2, using BSC as shower seat, and taking rest breaks     Vision         Perception     Praxis      Pertinent Vitals/Pain Pain Assessment: No/denies pain     Hand Dominance Right   Extremity/Trunk Assessment Upper Extremity Assessment Upper Extremity Assessment: Generalized weakness   Lower Extremity Assessment Lower Extremity Assessment: Generalized weakness   Cervical / Trunk Assessment Cervical / Trunk Assessment: Normal   Communication Communication Communication: No difficulties   Cognition Arousal/Alertness: Awake/alert Behavior During Therapy: WFL for tasks assessed/performed Overall Cognitive Status: Within Functional Limits for tasks assessed                                     General Comments  Spo2 ranging between 96-87% on 2L    Exercises     Shoulder Instructions  Home Living Family/patient expects to be discharged to:: Private residence Living Arrangements: Spouse/significant other(Husband passed away on 2020-01-02 from [place] and was at [facility]) Available Help at Discharge: Family Type of Home: House       Home Layout: Two level Alternate Level Stairs-Number of Steps: 14 Alternate Level Stairs-Rails: Right;Left Bathroom Shower/Tub: Occupational psychologist: Standard     Home Equipment: Environmental consultant - 2 wheels;Bedside commode          Prior Functioning/Environment Level of Independence: Independent        Comments: Very active. ADLs, IADLs, and enjoys gardening        OT Problem List: Decreased strength;Impaired balance (sitting and/or standing);Decreased knowledge of use of DME or AE;Cardiopulmonary status limiting activity       OT Treatment/Interventions: Self-care/ADL training;Therapeutic exercise;Energy conservation;DME and/or AE instruction;Therapeutic activities;Patient/family education    OT Goals(Current goals can be found in the care plan section) Acute Rehab OT Goals Patient Stated Goal: to go home soon OT Goal Formulation: With patient Time For Goal Achievement: 01/05/20 Potential to Achieve Goals: Good  OT Frequency: Min 2X/week   Barriers to D/C:            Co-evaluation              AM-PAC OT "6 Clicks" Daily Activity     Outcome Measure Help from another person eating meals?: None Help from another person taking care of personal grooming?: None Help from another person toileting, which includes using toliet, bedpan, or urinal?: None Help from another person bathing (including washing, rinsing, drying)?: None Help from another person to put on and taking off regular upper body clothing?: None Help from another person to put on and taking off regular lower body clothing?: None 6 Click Score: 24   End of Session Equipment Utilized During Treatment: Oxygen(2L) Nurse Communication: Mobility status  Activity Tolerance: Patient tolerated treatment well Patient left: in chair;with call bell/phone within reach  OT Visit Diagnosis: Unsteadiness on feet (R26.81);Other abnormalities of gait and mobility (R26.89);Muscle weakness (generalized) (M62.81)                Time: [id number] OT Time Calculation (min): 30 min Charges:  OT General Charges $OT Visit: 1 Visit OT Evaluation $OT Eval Low Complexity: 1 Low OT Treatments $Self Care/Home Management : 8-22 mins  Simisola Sandles MSOT, OTR/L Acute Rehab Pager: [phone] Office: [place] 12/22/2019, 3:45 PM

## 2019-12-22 NOTE — Progress Notes (Signed)
Pt stable with no signs of distress on 3L [place] with O2 saturation above 92%. Report given to oncoming nurse. Safety maintained.

## 2019-12-22 NOTE — Plan of Care (Signed)
  Problem: Education: Goal: Knowledge of General Education information will improve Description: Including pain rating scale, medication(s)/side effects and non-pharmacologic comfort measures Outcome: Progressing   Problem: Health Behavior/Discharge Planning: Goal: Ability to manage health-related needs will improve Outcome: Progressing   Problem: Clinical Measurements: Goal: Ability to maintain clinical measurements within normal limits will improve Outcome: Progressing Goal: Will remain free from infection Outcome: Progressing Goal: Diagnostic test results will improve Outcome: Progressing Goal: Respiratory complications will improve Outcome: Progressing Goal: Cardiovascular complication will be avoided Outcome: Progressing   Problem: Activity: Goal: Risk for activity intolerance will decrease Outcome: Progressing   Problem: Nutrition: Goal: Adequate nutrition will be maintained Outcome: Progressing   Problem: Elimination: Goal: Will not experience complications related to bowel motility Outcome: Progressing Goal: Will not experience complications related to urinary retention Outcome: Progressing   Problem: Pain Managment: Goal: General experience of comfort will improve Outcome: Progressing   Problem: Coping: Goal: Ability to identify and develop effective coping behavior will improve Outcome: Progressing

## 2019-12-22 NOTE — Progress Notes (Signed)
PROGRESS NOTE                                                                                                                                                                                                             Patient Demographics:    Erika Brown, is a 79 y.o. female, DOB - 07/26/1941, [id number]  Outpatient Primary MD for the patient is [place], Betty G, MD   Admit date - 12/20/2019   LOS - 2  Chief Complaint  Patient presents with  . Shortness of Breath  . Cough       Brief Narrative: Patient is a 79 y.o. female with PMHx of CAD s/p PCI, depression, anxiety, HTN, HLD who presented to the ED with perioral cyanosis after a coughing spell-was found to have acute hypoxic respiratory failure secondary to COVID-19 pneumonia.  Her husband recently passed away from COVID-73.  Significant events: 1/21>>bamlanivimab infusion (just prior to this hospitalization)   Subjective:   Is better-down to 2 L of oxygen.  Continues to have cough but no other major complaints.   Assessment  & Plan :   Acute Hypoxic Resp Failure due to Covid 19 Viral pneumonia and concurrent bacterial pneumonia: Improved-oxygen requirements trending down-down to 2 L of oxygen.  CRP also trending down-plans are to continue steroids/remdesivir and empiric Rocephin/Zithromax.  Not a candidate for Actemra given significantly elevated procalcitonin.    Fever: afebrile  O2 requirements:  SpO2: 97 % O2 Flow Rate (L/min): 1.5 L/min   COVID-19 Labs: Recent Labs    12/20/19 1545 12/21/19 0706 12/22/19 0054  DDIMER 4.08* 3.04* 2.89*  FERRITIN 366* 515* 663*  LDH 266*  --   --   CRP 17.6* 21.8* 19.3*       Component Value Date/Time   BNP 89.2 12/21/2019 0706    Recent Labs  Lab 12/20/19 1545 12/21/19 0706  PROCALCITON 0.85 27.43    Lab Results  Component Value Date   SARSCOV2NAA POSITIVE (A) 12/20/2019   [place] Not  Detected 12/11/2019     COVID-19 Medications: Steroids: 1/21>> Remdesivir: 1/21  Other medications: Diuretics:Euvolemic-no need for lasix Antibiotics: Rocephin: 1/21>> Zithromax: 1/21>>  Prone/Incentive Spirometry: encouraged  incentive spirometry use 3-4/hour.  DVT Prophylaxis  :  Lovenox   CAD: No anginal symptoms-continue aspirin/Plavix/metoprolol.  HTN: Controlled-continue amlodipine-continue to hold Avapro/HCTZ  HLD: Continue Zetia  History of prediabetes: CBGs stable on SSI  CBG (last 3)  Recent Labs    12/21/19 1611 12/21/19 1931 12/22/19 0810  GLUCAP 215* 290* 184*   Consults  :  None  Procedures  :  None  ABG: No results found for: PHART, PCO2ART, PO2ART, HCO3, TCO2, ACIDBASEDEF, O2SAT  Vent Settings: N/A   Condition -  Guarded  Family Communication  : Spoke with daughter over the phone on 1/23.  Code Status :  DNR Diet :  Diet Order            Diet Heart Room service appropriate? Yes; Fluid consistency: Thin  Diet effective now               Disposition Plan  :  Remain hospitalized-probably home with home health services when ready for discharge  Barriers to discharge: Hypoxia requiring O2 supplementation/complete 5 days of IV Remdesivir/IV antibiotics  Antimicorbials  :    Anti-infectives (From admission, onward)   Start     Dose/Rate Route Frequency Ordered Stop   12/21/19 2000  cefTRIAXone (ROCEPHIN) 1 g in sodium chloride 0.9 % 100 mL IVPB     1 g 200 mL/hr over 30 Minutes Intravenous Every 24 hours 12/20/19 1839     12/21/19 2000  remdesivir 100 mg in sodium chloride 0.9 % 100 mL IVPB  Status:  Discontinued     100 mg 200 mL/hr over 30 Minutes Intravenous Daily 12/20/19 1907 12/21/19 0931   12/21/19 1800  azithromycin (ZITHROMAX) 500 mg in sodium chloride 0.9 % 250 mL IVPB     500 mg 250 mL/hr over 60 Minutes Intravenous Every 24 hours 12/20/19 1839     12/21/19 1000  remdesivir 100 mg in sodium chloride 0.9 % 100 mL IVPB      100 mg 200 mL/hr over 30 Minutes Intravenous Daily 12/21/19 0931     12/20/19 2000  remdesivir 200 mg in sodium chloride 0.9% 250 mL IVPB     200 mg 580 mL/hr over 30 Minutes Intravenous Once 12/20/19 1907 12/20/19 2258   12/20/19 1730  cefTRIAXone (ROCEPHIN) 1 g in sodium chloride 0.9 % 100 mL IVPB     1 g 200 mL/hr over 30 Minutes Intravenous  Once 12/20/19 1722 12/20/19 1940   12/20/19 1730  azithromycin (ZITHROMAX) 500 mg in sodium chloride 0.9 % 250 mL IVPB     500 mg 250 mL/hr over 60 Minutes Intravenous  Once 12/20/19 1722 12/20/19 1911      Inpatient Medications  Scheduled Meds: . amLODipine  5 mg Oral Daily  . vitamin C  500 mg Oral Daily  . aspirin  81 mg Oral Daily  . benzonatate  200 mg Oral TID  . clopidogrel  75 mg Oral Daily  . enoxaparin (LOVENOX) injection  40 mg Subcutaneous Q24H  . ezetimibe  10 mg Oral Daily  . insulin aspart  0-9 Units Subcutaneous TID WC  . Ipratropium-Albuterol  1 puff Inhalation Q6H  . methylPREDNISolone (SOLU-MEDROL) injection  40 mg Intravenous Q12H  . metoprolol succinate  50 mg Oral Daily  . pantoprazole  40 mg Oral Daily  . [START ON 12/25/2019] Vitamin D (Ergocalciferol)  50,000 Units Oral Once per day on Tue Fri  . Vitamin D (Ergocalciferol)  50,000 Units Oral Once  . zinc sulfate  220 mg Oral Daily   Continuous Infusions: . azithromycin 500 mg (12/21/19 1707)  . cefTRIAXone (ROCEPHIN)  IV 1 g (12/21/19 2053)  . remdesivir 100 mg in  NS 100 mL 100 mg (12/22/19 0831)   PRN Meds:.acetaminophen, albuterol, bisacodyl, chlorpheniramine-HYDROcodone, guaiFENesin-dextromethorphan, meclizine, nitroGLYCERIN, ondansetron **OR** ondansetron (ZOFRAN) IV, oxyCODONE, polyethylene glycol   Time Spent in minutes  25  Oren Binet M.D on 12/22/2019 at 11:37 AM  To page go to [email or web address] - use universal password  [facility] -  Office  [phone]    Objective:   Vitals:   12/22/19 0053 12/22/19 0054 12/22/19 0432 12/22/19  0800  BP:   130/64 120/62  Pulse:   72   Resp: 18 20 17    Temp:   98 F (36.7 C) 98 F (36.7 C)  TempSrc:   Oral Oral  SpO2:   97%   Weight:      Height:        Wt Readings from Last 3 Encounters:  12/20/19 63.5 kg  11/07/19 66.2 kg  12/15/18 64.6 kg     Intake/Output Summary (Last 24 hours) at 12/22/2019 1137 Last data filed at 12/22/2019 1000 Gross per 24 hour  Intake 515 ml  Output --  Net 515 ml     Physical Exam Gen Exam:Alert awake-not in any distress HEENT:atraumatic, normocephalic Chest: B/L clear to auscultation anteriorly CVS:S1S2 regular Abdomen:soft non tender, non distended Extremities:no edema Neurology: Non focal Skin: no rash   Data Review:    CBC Recent Labs  Lab 12/20/19 1545 12/21/19 0706 12/22/19 0054  WBC 7.2 9.6 10.1  HGB 12.9 12.3 12.1  HCT 39.3 38.1 37.8  PLT 159 178 245  MCV 83.1 83.2 83.4  MCH 27.3 26.9 26.7  MCHC 32.8 32.3 32.0  RDW 13.4 13.7 14.0  LYMPHSABS 0.2* 0.3* 0.6*  MONOABS 0.2 0.2 0.3  EOSABS 0.0 0.0 0.0  BASOSABS 0.0 0.0 0.0    Chemistries  Recent Labs  Lab 12/20/19 1545 12/21/19 0706 12/22/19 0054  NA 130* 134* 136  K 3.6 3.6 3.5  CL 93* 99 99  CO2 24 26 25   GLUCOSE 157* 178* 276*  BUN 11 12 22   CREATININE 0.89 0.60 0.81  CALCIUM 8.9 8.9 9.4  AST 41 36 38  ALT 51* 48* 53*  ALKPHOS 60 56 69  BILITOT 0.6 0.5 0.3   ------------------------------------------------------------------------------------------------------------------ Recent Labs    12/20/19 1545  TRIG 75    Lab Results  Component Value Date   HGBA1C 6.1 (H) 12/21/2019   ------------------------------------------------------------------------------------------------------------------ No results for input(s): TSH, T4TOTAL, T3FREE, THYROIDAB in the last 72 hours.  Invalid input(s): FREET3 ------------------------------------------------------------------------------------------------------------------ Recent Labs     12/21/19 0706 12/22/19 0054  FERRITIN 515* 663*    Coagulation profile No results for input(s): INR, PROTIME in the last 168 hours.  Recent Labs    12/21/19 0706 12/22/19 0054  DDIMER 3.04* 2.89*    Cardiac Enzymes No results for input(s): CKMB, TROPONINI, MYOGLOBIN in the last 168 hours.  Invalid input(s): CK ------------------------------------------------------------------------------------------------------------------    Component Value Date/Time   BNP 89.2 12/21/2019 0706    Micro Results Recent Results (from the past 240 hour(s))  Blood Culture (routine x 2)     Status: None (Preliminary result)   Collection Time: 12/20/19  4:12 PM   Specimen: BLOOD  Result Value Ref Range Status   Specimen Description BLOOD LEFT ANTECUBITAL  Final   Special Requests   Final    BOTTLES DRAWN AEROBIC AND ANAEROBIC Blood Culture adequate volume   Culture   Final    NO GROWTH 2 DAYS Performed at [facility], [address], [place], [place] [address]  Report Status PENDING  Incomplete  Blood Culture (routine x 2)     Status: Abnormal (Preliminary result)   Collection Time: 12/20/19  6:06 PM   Specimen: BLOOD LEFT HAND  Result Value Ref Range Status   Specimen Description BLOOD LEFT HAND  Final   Special Requests   Final    BOTTLES DRAWN AEROBIC AND ANAEROBIC Blood Culture adequate volume   Culture  Setup Time   Final    GRAM POSITIVE COCCI IN CLUSTERS IN BOTH AEROBIC AND ANAEROBIC BOTTLES CRITICAL RESULT CALLED TO, READ BACK BY AND VERIFIED WITH: PHARMD M. PHAM [id number] FCP    Culture (A)  Final    STAPHYLOCOCCUS SPECIES (COAGULASE NEGATIVE) THE SIGNIFICANCE OF ISOLATING THIS ORGANISM FROM A SINGLE SET OF BLOOD CULTURES WHEN MULTIPLE SETS ARE DRAWN IS UNCERTAIN. PLEASE NOTIFY THE MICROBIOLOGY DEPARTMENT WITHIN ONE WEEK IF SPECIATION AND SENSITIVITIES ARE REQUIRED. Performed at [facility], [place] [address] [place], [place] [address]    Report Status  PENDING  Incomplete  Respiratory Panel by RT PCR (Flu A&B, Covid) - Nasopharyngeal Swab     Status: Abnormal   Collection Time: 12/20/19  6:12 PM   Specimen: Nasopharyngeal Swab  Result Value Ref Range Status   SARS Coronavirus 2 by RT PCR POSITIVE (A) NEGATIVE Final    Comment: RESULT CALLED TO, READ BACK BY AND VERIFIED WITH: K SPRANGERS RN 12/20/19 JDW 2035 (NOTE) SARS-CoV-2 target nucleic acids are DETECTED. SARS-CoV-2 RNA is generally detectable in upper respiratory specimens  during the acute phase of infection. Positive results are indicative of the presence of the identified virus, but do not rule out bacterial infection or co-infection with other pathogens not detected by the test. Clinical correlation with patient history and other diagnostic information is necessary to determine patient infection status. The expected result is Negative. Fact Sheet for Patients:  PinkCheek.be Fact Sheet for Healthcare Providers: GravelBags.it This test is not yet approved or cleared by the [place] FDA and  has been authorized for detection and/or diagnosis of SARS-CoV-2 by FDA under an Emergency Use Authorization (EUA).  This EUA will remain in effect (meaning this test can be used) for t he duration of  the COVID-19 declaration under Section 564(b)(1) of the Act, 21 U.S.C. section 360bbb-3(b)(1), unless the authorization is terminated or revoked sooner.    Influenza A by PCR NEGATIVE NEGATIVE Final   Influenza B by PCR NEGATIVE NEGATIVE Final    Comment: (NOTE) The Xpert Xpress SARS-CoV-2/FLU/RSV assay is intended as an aid in  the diagnosis of influenza from Nasopharyngeal swab specimens and  should not be used as a sole basis for treatment. Nasal washings and  aspirates are unacceptable for Xpert Xpress SARS-CoV-2/FLU/RSV  testing. Fact Sheet for Patients: PinkCheek.be Fact Sheet for  Healthcare Providers: GravelBags.it This test is not yet approved or cleared by the [place] FDA and  has been authorized for detection and/or diagnosis of SARS-CoV-2 by  FDA under an Emergency Use Authorization (EUA). This EUA will remain  in effect (meaning this test can be used) for the duration of the  Covid-19 declaration under Section 564(b)(1) of the Act, 21  U.S.C. section 360bbb-3(b)(1), unless the authorization is  terminated or revoked. Performed at [facility], [place] [address], [place], [place] [address]     Radiology Reports Portable chest 1 View  Result Date: 12/21/2019 CLINICAL DATA:  Shortness of breath. EXAM: PORTABLE CHEST 1 VIEW COMPARISON:  12/20/2019.  CT 03/31/2018. FINDINGS: Mediastinum  and hilar structures are unremarkable. Heart size normal. Coronary stents noted. Multifocal bilateral pulmonary infiltrates are again noted without interim change. Bibasilar atelectasis. No pleural effusion or pneumothorax. No acute bony abnormality. IMPRESSION: Multifocal bilateral pulmonary infiltrates are again noted without interim change. Bibasilar atelectasis. Electronically Signed   By: Marcello Moores  Register   On: 12/21/2019 07:04   DG Chest Port 1 View  Result Date: 12/20/2019 CLINICAL DATA:  Short of breath.  Cough. EXAM: PORTABLE CHEST 1 VIEW COMPARISON:  03/30/2018 and 06/12/2015.  Chest CT, 03/31/2018. FINDINGS: There are interstitial and hazy airspace opacities in the lower lungs, new since the prior exams. Upper lungs are clear. Cardiac silhouette is normal in size. Two adjacent left coronary artery stents are noted. No mediastinal or hilar masses or evidence of adenopathy. No convincing pleural effusion and no pneumothorax. Skeletal structures are grossly intact. IMPRESSION: 1. New interstitial and hazy airspace opacities in the lower lungs. Consider multifocal pneumonia in the proper clinical setting. Findings may reflect inflammation. No  evidence of pulmonary edema. Electronically Signed   By: Lajean Manes M.D.   On: 12/20/2019 16:31

## 2019-12-23 LAB — D-DIMER, QUANTITATIVE: D-Dimer, Quant: 1.9 ug{FEU}/mL — ABNORMAL HIGH (ref 0.00–0.50)

## 2019-12-23 LAB — COMPREHENSIVE METABOLIC PANEL WITH GFR
ALT: 41 U/L (ref 0–44)
AST: 21 U/L (ref 15–41)
Albumin: 2.6 g/dL — ABNORMAL LOW (ref 3.5–5.0)
Alkaline Phosphatase: 64 U/L (ref 38–126)
Anion gap: 11 (ref 5–15)
BUN: 26 mg/dL — ABNORMAL HIGH (ref 8–23)
CO2: 24 mmol/L (ref 22–32)
Calcium: 8.9 mg/dL (ref 8.9–10.3)
Chloride: 101 mmol/L (ref 98–111)
Creatinine, Ser: 0.65 mg/dL (ref 0.44–1.00)
GFR calc Af Amer: >60 mL/min
GFR calc non Af Amer: >60 mL/min
Glucose, Bld: 256 mg/dL — ABNORMAL HIGH (ref 70–99)
Potassium: 3.6 mmol/L (ref 3.5–5.1)
Sodium: 136 mmol/L (ref 135–145)
Total Bilirubin: 0.4 mg/dL (ref 0.3–1.2)
Total Protein: 5.9 g/dL — ABNORMAL LOW (ref 6.5–8.1)

## 2019-12-23 LAB — CBC WITH DIFFERENTIAL/PLATELET
Abs Immature Granulocytes: 0.05 10*3/uL (ref 0.00–0.07)
Basophils Absolute: 0 10*3/uL (ref 0.0–0.1)
Basophils Relative: 0 %
Eosinophils Absolute: 0 10*3/uL (ref 0.0–0.5)
Eosinophils Relative: 0 %
HCT: 35 % — ABNORMAL LOW (ref 36.0–46.0)
Hemoglobin: 11.4 g/dL — ABNORMAL LOW (ref 12.0–15.0)
Immature Granulocytes: 1 %
Lymphocytes Relative: 7 %
Lymphs Abs: 0.7 10*3/uL (ref 0.7–4.0)
MCH: 26.9 pg (ref 26.0–34.0)
MCHC: 32.6 g/dL (ref 30.0–36.0)
MCV: 82.5 fL (ref 80.0–100.0)
Monocytes Absolute: 0.3 10*3/uL (ref 0.1–1.0)
Monocytes Relative: 3 %
Neutro Abs: 9 10*3/uL — ABNORMAL HIGH (ref 1.7–7.7)
Neutrophils Relative %: 89 %
Platelets: 293 10*3/uL (ref 150–400)
RBC: 4.24 MIL/uL (ref 3.87–5.11)
RDW: 14 % (ref 11.5–15.5)
WBC: 10.2 10*3/uL (ref 4.0–10.5)
nRBC: 0 % (ref 0.0–0.2)

## 2019-12-23 LAB — GLUCOSE, CAPILLARY
Glucose-Capillary: 222 mg/dL — ABNORMAL HIGH (ref 70–99)
Glucose-Capillary: 263 mg/dL — ABNORMAL HIGH (ref 70–99)
Glucose-Capillary: 302 mg/dL — ABNORMAL HIGH (ref 70–99)
Glucose-Capillary: 311 mg/dL — ABNORMAL HIGH (ref 70–99)

## 2019-12-23 LAB — C-REACTIVE PROTEIN: CRP: 9 mg/dL — ABNORMAL HIGH (ref <1.0)

## 2019-12-23 LAB — CULTURE, BLOOD (ROUTINE X 2): Special Requests: ADEQUATE

## 2019-12-23 LAB — FERRITIN: Ferritin: 635 ng/mL — ABNORMAL HIGH (ref 11–307)

## 2019-12-23 MED ORDER — INSULIN ASPART 100 UNIT/ML ~~LOC~~ SOLN
0.0000 [IU] | Freq: Three times a day (TID) | SUBCUTANEOUS | Status: DC
  Administered 2019-12-24: 3 [IU] via SUBCUTANEOUS
  Administered 2019-12-24: 2 [IU] via SUBCUTANEOUS

## 2019-12-23 MED ORDER — METHYLPREDNISOLONE SODIUM SUCC 40 MG IJ SOLR
20.0000 mg | Freq: Two times a day (BID) | INTRAMUSCULAR | Status: DC
  Administered 2019-12-23 – 2019-12-24 (×2): 20 mg via INTRAVENOUS
  Filled 2019-12-23 (×2): qty 1

## 2019-12-23 MED ORDER — INSULIN ASPART 100 UNIT/ML ~~LOC~~ SOLN
0.0000 [IU] | Freq: Every day | SUBCUTANEOUS | Status: DC
  Administered 2019-12-23: 4 [IU] via SUBCUTANEOUS

## 2019-12-23 NOTE — Progress Notes (Addendum)
PROGRESS NOTE                                                                                                                                                                                                             Patient Demographics:    Erika Brown, is a 79 y.o. female, DOB - Aug 15, 1941, [id number]  Outpatient Primary MD for the patient is [place], Betty G, MD   Admit date - 12/20/2019   LOS - 3  Chief Complaint  Patient presents with  . Shortness of Breath  . Cough       Brief Narrative: Patient is a 79 y.o. female with PMHx of CAD s/p PCI, depression, anxiety, HTN, HLD who presented to the ED with perioral cyanosis after a coughing spell-was found to have acute hypoxic respiratory failure secondary to COVID-19 pneumonia.  Her husband recently passed away from COVID-7.  Significant events: 1/17>> Covid positive test at [facility] (per family) 1/21>>bamlanivimab infusion (just prior to this hospitalization)   Subjective:   Feels much better-was titrated down to room air this morning.   Assessment  & Plan :   Acute Hypoxic Resp Failure due to Covid 19 Viral pneumonia and concurrent bacterial pneumonia: Continues to improve-on room air this morning.  CRP still elevated but trending down.  Continue steroids/remdesivir (Taper steroids)-and empiric antibiotics.    Assess for home O2 monitoring prior to discharge.  Fever: afebrile  O2 requirements:  SpO2: 92 % O2 Flow Rate (L/min): 3 L/min   COVID-19 Labs: Recent Labs    12/20/19 1545 12/20/19 1545 12/21/19 0706 12/22/19 0054 12/23/19 0325  DDIMER 4.08*   < > 3.04* 2.89* 1.90*  FERRITIN 366*   < > 515* 663* 635*  LDH 266*  --   --   --   --   CRP 17.6*   < > 21.8* 19.3* 9.0*   < > = values in this interval not displayed.       Component Value Date/Time   BNP 89.2 12/21/2019 0706    Recent Labs  Lab 12/20/19 1545 12/21/19 0706  PROCALCITON  0.85 27.43    Lab Results  Component Value Date   SARSCOV2NAA POSITIVE (A) 12/20/2019   [place] Not Detected 12/11/2019     COVID-19 Medications: Steroids: 1/21>> Remdesivir: 1/21  Other medications: Diuretics:Euvolemic-no need for lasix Antibiotics: Rocephin: 1/21>>  Zithromax: 1/21>>  Prone/Incentive Spirometry: encouraged  incentive spirometry use 3-4/hour.  DVT Prophylaxis  :  Lovenox   CAD: No anginal symptoms-continue aspirin/Plavix/metoprolol.  HTN: Controlled-continue amlodipine-continue to hold Avapro/HCTZ  HLD: Continue Zetia  History of prediabetes with hyperglycemia due to steroids (A1c 6.1): CBGs elevated as patient on steroids-have decreased steroids dosage today-we will watch closely.  Follow for now.    CBG (last 3)  Recent Labs    12/22/19 2054 12/23/19 0833 12/23/19 1145  GLUCAP 253* 222* 263*   Consults  :  None  Procedures  :  None  ABG: No results found for: PHART, PCO2ART, PO2ART, HCO3, TCO2, ACIDBASEDEF, O2SAT  Vent Settings: N/A   Condition -  Guarded  Family Communication  : Spoke with daughter over the phone on 1/24.  Code Status :  DNR Diet :  Diet Order            Diet Heart Room service appropriate? Yes; Fluid consistency: Thin  Diet effective now               Disposition Plan  :  Remain hospitalized-probably home on 1/25 if clinical improvement continues.  Barriers to discharge: Hypoxia requiring O2 supplementation/complete 5 days of IV Remdesivir/IV antibiotics  Antimicorbials  :    Anti-infectives (From admission, onward)   Start     Dose/Rate Route Frequency Ordered Stop   12/21/19 2000  cefTRIAXone (ROCEPHIN) 1 g in sodium chloride 0.9 % 100 mL IVPB     1 g 200 mL/hr over 30 Minutes Intravenous Every 24 hours 12/20/19 1839     12/21/19 2000  remdesivir 100 mg in sodium chloride 0.9 % 100 mL IVPB  Status:  Discontinued     100 mg 200 mL/hr over 30 Minutes Intravenous Daily 12/20/19 1907 12/21/19 0931    12/21/19 1800  azithromycin (ZITHROMAX) 500 mg in sodium chloride 0.9 % 250 mL IVPB     500 mg 250 mL/hr over 60 Minutes Intravenous Every 24 hours 12/20/19 1839     12/21/19 1000  remdesivir 100 mg in sodium chloride 0.9 % 100 mL IVPB     100 mg 200 mL/hr over 30 Minutes Intravenous Daily 12/21/19 0931     12/20/19 2000  remdesivir 200 mg in sodium chloride 0.9% 250 mL IVPB     200 mg 580 mL/hr over 30 Minutes Intravenous Once 12/20/19 1907 12/20/19 2258   12/20/19 1730  cefTRIAXone (ROCEPHIN) 1 g in sodium chloride 0.9 % 100 mL IVPB     1 g 200 mL/hr over 30 Minutes Intravenous  Once 12/20/19 1722 12/20/19 1940   12/20/19 1730  azithromycin (ZITHROMAX) 500 mg in sodium chloride 0.9 % 250 mL IVPB     500 mg 250 mL/hr over 60 Minutes Intravenous  Once 12/20/19 1722 12/20/19 1911      Inpatient Medications  Scheduled Meds: . amLODipine  5 mg Oral Daily  . vitamin C  500 mg Oral Daily  . aspirin  81 mg Oral Daily  . benzonatate  200 mg Oral TID  . clopidogrel  75 mg Oral Daily  . enoxaparin (LOVENOX) injection  40 mg Subcutaneous Q24H  . ezetimibe  10 mg Oral Daily  . insulin aspart  0-9 Units Subcutaneous TID WC  . Ipratropium-Albuterol  1 puff Inhalation Q6H  . methylPREDNISolone (SOLU-MEDROL) injection  40 mg Intravenous Q12H  . metoprolol succinate  50 mg Oral Daily  . pantoprazole  40 mg Oral Daily  . [START ON 12/25/2019] Vitamin  D (Ergocalciferol)  50,000 Units Oral Once per day on Tue Fri  . zinc sulfate  220 mg Oral Daily   Continuous Infusions: . azithromycin 500 mg (12/22/19 1752)  . cefTRIAXone (ROCEPHIN)  IV 1 g (12/22/19 2049)  . remdesivir 100 mg in NS 100 mL 100 mg (12/23/19 1015)   PRN Meds:.acetaminophen, albuterol, bisacodyl, chlorpheniramine-HYDROcodone, guaiFENesin-dextromethorphan, meclizine, nitroGLYCERIN, ondansetron **OR** ondansetron (ZOFRAN) IV, oxyCODONE, polyethylene glycol   Time Spent in minutes  25  Oren Binet M.D on 12/23/2019 at 12:27  PM  To page go to [email or web address] - use universal password  [facility] -  Office  [phone]    Objective:   Vitals:   12/22/19 1954 12/22/19 1955 12/23/19 0052 12/23/19 0427  BP:   125/75 (!) 146/73  Pulse:   93 92  Resp: 20 15 (!) 25 (!) 25  Temp:   98.3 F (36.8 C) 97.6 F (36.4 C)  TempSrc:   Oral Axillary  SpO2:   90% 92%  Weight:      Height:        Wt Readings from Last 3 Encounters:  12/20/19 63.5 kg  11/07/19 66.2 kg  12/15/18 64.6 kg     Intake/Output Summary (Last 24 hours) at 12/23/2019 1227 Last data filed at 12/22/2019 2200 Gross per 24 hour  Intake 1320 ml  Output --  Net 1320 ml     Physical Exam Gen Exam:Alert awake-not in any distress HEENT:atraumatic, normocephalic Chest: B/L clear to auscultation anteriorly CVS:S1S2 regular Abdomen:soft non tender, non distended Extremities:no edema Neurology: Non focal Skin: no rash   Data Review:    CBC Recent Labs  Lab 12/20/19 1545 12/21/19 0706 12/22/19 0054 12/23/19 0325  WBC 7.2 9.6 10.1 10.2  HGB 12.9 12.3 12.1 11.4*  HCT 39.3 38.1 37.8 35.0*  PLT 159 178 245 293  MCV 83.1 83.2 83.4 82.5  MCH 27.3 26.9 26.7 26.9  MCHC 32.8 32.3 32.0 32.6  RDW 13.4 13.7 14.0 14.0  LYMPHSABS 0.2* 0.3* 0.6* 0.7  MONOABS 0.2 0.2 0.3 0.3  EOSABS 0.0 0.0 0.0 0.0  BASOSABS 0.0 0.0 0.0 0.0    Chemistries  Recent Labs  Lab 12/20/19 1545 12/21/19 0706 12/22/19 0054 12/23/19 0325  NA 130* 134* 136 136  K 3.6 3.6 3.5 3.6  CL 93* 99 99 101  CO2 24 26 25 24   GLUCOSE 157* 178* 276* 256*  BUN 11 12 22  26*  CREATININE 0.89 0.60 0.81 0.65  CALCIUM 8.9 8.9 9.4 8.9  AST 41 36 38 21  ALT 51* 48* 53* 41  ALKPHOS 60 56 69 64  BILITOT 0.6 0.5 0.3 0.4   ------------------------------------------------------------------------------------------------------------------ Recent Labs    12/20/19 1545  TRIG 75    Lab Results  Component Value Date   HGBA1C 6.1 (H) 12/21/2019    ------------------------------------------------------------------------------------------------------------------ No results for input(s): TSH, T4TOTAL, T3FREE, THYROIDAB in the last 72 hours.  Invalid input(s): FREET3 ------------------------------------------------------------------------------------------------------------------ Recent Labs    12/22/19 0054 12/23/19 0325  FERRITIN 663* 635*    Coagulation profile No results for input(s): INR, PROTIME in the last 168 hours.  Recent Labs    12/22/19 0054 12/23/19 0325  DDIMER 2.89* 1.90*    Cardiac Enzymes No results for input(s): CKMB, TROPONINI, MYOGLOBIN in the last 168 hours.  Invalid input(s): CK ------------------------------------------------------------------------------------------------------------------    Component Value Date/Time   BNP 89.2 12/21/2019 0706    Micro Results Recent Results (from the past 240 hour(s))  Blood Culture (routine x 2)  Status: None (Preliminary result)   Collection Time: 12/20/19  4:12 PM   Specimen: BLOOD  Result Value Ref Range Status   Specimen Description BLOOD LEFT ANTECUBITAL  Final   Special Requests   Final    BOTTLES DRAWN AEROBIC AND ANAEROBIC Blood Culture adequate volume   Culture   Final    NO GROWTH 3 DAYS Performed at [facility], [address], [place], [place] [address]    Report Status PENDING  Incomplete  Blood Culture (routine x 2)     Status: Abnormal   Collection Time: 12/20/19  6:06 PM   Specimen: BLOOD LEFT HAND  Result Value Ref Range Status   Specimen Description BLOOD LEFT HAND  Final   Special Requests   Final    BOTTLES DRAWN AEROBIC AND ANAEROBIC Blood Culture adequate volume   Culture  Setup Time   Final    GRAM POSITIVE COCCI IN CLUSTERS IN BOTH AEROBIC AND ANAEROBIC BOTTLES CRITICAL RESULT CALLED TO, READ BACK BY AND VERIFIED WITH: PHARMD M. PHAM [id number] FCP    Culture (A)  Final    STAPHYLOCOCCUS SPECIES (COAGULASE  NEGATIVE) THE SIGNIFICANCE OF ISOLATING THIS ORGANISM FROM A SINGLE SET OF BLOOD CULTURES WHEN MULTIPLE SETS ARE DRAWN IS UNCERTAIN. PLEASE NOTIFY THE MICROBIOLOGY DEPARTMENT WITHIN ONE WEEK IF SPECIATION AND SENSITIVITIES ARE REQUIRED. Performed at [facility], [place] [address] [place], [place] [address]    Report Status 12/23/2019 FINAL  Final  Respiratory Panel by RT PCR (Flu A&B, Covid) - Nasopharyngeal Swab     Status: Abnormal   Collection Time: 12/20/19  6:12 PM   Specimen: Nasopharyngeal Swab  Result Value Ref Range Status   SARS Coronavirus 2 by RT PCR POSITIVE (A) NEGATIVE Final    Comment: RESULT CALLED TO, READ BACK BY AND VERIFIED WITH: K SPRANGERS RN 12/20/19 JDW 2035 (NOTE) SARS-CoV-2 target nucleic acids are DETECTED. SARS-CoV-2 RNA is generally detectable in upper respiratory specimens  during the acute phase of infection. Positive results are indicative of the presence of the identified virus, but do not rule out bacterial infection or co-infection with other pathogens not detected by the test. Clinical correlation with patient history and other diagnostic information is necessary to determine patient infection status. The expected result is Negative. Fact Sheet for Patients:  PinkCheek.be Fact Sheet for Healthcare Providers: GravelBags.it This test is not yet approved or cleared by the [place] FDA and  has been authorized for detection and/or diagnosis of SARS-CoV-2 by FDA under an Emergency Use Authorization (EUA).  This EUA will remain in effect (meaning this test can be used) for t he duration of  the COVID-19 declaration under Section 564(b)(1) of the Act, 21 U.S.C. section 360bbb-3(b)(1), unless the authorization is terminated or revoked sooner.    Influenza A by PCR NEGATIVE NEGATIVE Final   Influenza B by PCR NEGATIVE NEGATIVE Final    Comment: (NOTE) The Xpert Xpress SARS-CoV-2/FLU/RSV  assay is intended as an aid in  the diagnosis of influenza from Nasopharyngeal swab specimens and  should not be used as a sole basis for treatment. Nasal washings and  aspirates are unacceptable for Xpert Xpress SARS-CoV-2/FLU/RSV  testing. Fact Sheet for Patients: PinkCheek.be Fact Sheet for Healthcare Providers: GravelBags.it This test is not yet approved or cleared by the [place] FDA and  has been authorized for detection and/or diagnosis of SARS-CoV-2 by  FDA under an Emergency Use Authorization (EUA). This EUA will remain  in effect (meaning  this test can be used) for the duration of the  Covid-19 declaration under Section 564(b)(1) of the Act, 21  U.S.C. section 360bbb-3(b)(1), unless the authorization is  terminated or revoked. Performed at [facility], [place] [address], [place], [place] [address]     Radiology Reports Portable chest 1 View  Result Date: 12/21/2019 CLINICAL DATA:  Shortness of breath. EXAM: PORTABLE CHEST 1 VIEW COMPARISON:  12/20/2019.  CT 03/31/2018. FINDINGS: Mediastinum and hilar structures are unremarkable. Heart size normal. Coronary stents noted. Multifocal bilateral pulmonary infiltrates are again noted without interim change. Bibasilar atelectasis. No pleural effusion or pneumothorax. No acute bony abnormality. IMPRESSION: Multifocal bilateral pulmonary infiltrates are again noted without interim change. Bibasilar atelectasis. Electronically Signed   By: Marcello Moores  Register   On: 12/21/2019 07:04   DG Chest Port 1 View  Result Date: 12/20/2019 CLINICAL DATA:  Short of breath.  Cough. EXAM: PORTABLE CHEST 1 VIEW COMPARISON:  03/30/2018 and 06/12/2015.  Chest CT, 03/31/2018. FINDINGS: There are interstitial and hazy airspace opacities in the lower lungs, new since the prior exams. Upper lungs are clear. Cardiac silhouette is normal in size. Two adjacent left coronary artery stents are noted.  No mediastinal or hilar masses or evidence of adenopathy. No convincing pleural effusion and no pneumothorax. Skeletal structures are grossly intact. IMPRESSION: 1. New interstitial and hazy airspace opacities in the lower lungs. Consider multifocal pneumonia in the proper clinical setting. Findings may reflect inflammation. No evidence of pulmonary edema. Electronically Signed   By: Lajean Manes M.D.   On: 12/20/2019 16:31

## 2019-12-23 NOTE — Plan of Care (Signed)
Pt Aox4. No complaints of pain. She is on room air. She desats to 85-88% with activity/ambulation, but recuperates within 5-10 mins. She sat on the chair most of the morning. RN spoke with daughter, Joslyn Devon, and gave her updates.   Problem: Education: Goal: Knowledge of General Education information will improve Description: Including pain rating scale, medication(s)/side effects and non-pharmacologic comfort measures Outcome: Progressing   Problem: Health Behavior/Discharge Planning: Goal: Ability to manage health-related needs will improve Outcome: Progressing   Problem: Clinical Measurements: Goal: Ability to maintain clinical measurements within normal limits will improve Outcome: Progressing Goal: Will remain free from infection Outcome: Progressing Goal: Diagnostic test results will improve Outcome: Progressing Goal: Respiratory complications will improve Outcome: Progressing Goal: Cardiovascular complication will be avoided Outcome: Progressing   Problem: Activity: Goal: Risk for activity intolerance will decrease Outcome: Progressing   Problem: Nutrition: Goal: Adequate nutrition will be maintained Outcome: Progressing   Problem: Coping: Goal: Level of anxiety will decrease Outcome: Progressing   Problem: Elimination: Goal: Will not experience complications related to bowel motility Outcome: Progressing Goal: Will not experience complications related to urinary retention Outcome: Progressing   Problem: Pain Managment: Goal: General experience of comfort will improve Outcome: Progressing   Problem: Safety: Goal: Ability to remain free from injury will improve Outcome: Progressing   Problem: Skin Integrity: Goal: Risk for impaired skin integrity will decrease Outcome: Progressing   Problem: Education: Goal: Ability to state activities that reduce stress will improve Outcome: Progressing   Problem: Coping: Goal: Ability to identify and develop  effective coping behavior will improve Outcome: Progressing   Problem: Self-Concept: Goal: Ability to identify factors that promote anxiety will improve Outcome: Progressing Goal: Level of anxiety will decrease Outcome: Progressing Goal: Ability to modify response to factors that promote anxiety will improve Outcome: Progressing   Problem: Education: Goal: Knowledge of risk factors and measures for prevention of condition will improve Outcome: Progressing   Problem: Coping: Goal: Psychosocial and spiritual needs will be supported Outcome: Progressing   Problem: Respiratory: Goal: Will maintain a patent airway Outcome: Progressing Goal: Complications related to the disease process, condition or treatment will be avoided or minimized Outcome: Progressing

## 2019-12-23 NOTE — Progress Notes (Addendum)
SATURATION QUALIFICATIONS: (This note is used to comply with regulatory documentation for home oxygen)  Patient Saturations on Room Air at Rest = 95%  Patient Saturations on Room Air while Ambulating = 83%  Patient Saturations on 4 Liters of oxygen while Ambulating = 93%  Please briefly explain why patient needs home oxygen: RN walked with patient around the unit x2. Pt needed 2L on nasal cannula after 150 ft since O2 sat was ~83%.  Oxygen was increased to 4L after 300 ft. Pt remained on 2L for an hour after activity with O2 sats >93%.

## 2019-12-23 NOTE — Progress Notes (Signed)
Pt remained stable throughout the night with no signs of distress. Safety maintained.

## 2019-12-24 LAB — COMPREHENSIVE METABOLIC PANEL
ALT: 52 U/L — ABNORMAL HIGH (ref 0–44)
AST: 37 U/L (ref 15–41)
Albumin: 2.5 g/dL — ABNORMAL LOW (ref 3.5–5.0)
Alkaline Phosphatase: 63 U/L (ref 38–126)
Anion gap: 9 (ref 5–15)
BUN: 26 mg/dL — ABNORMAL HIGH (ref 8–23)
CO2: 25 mmol/L (ref 22–32)
Calcium: 8.6 mg/dL — ABNORMAL LOW (ref 8.9–10.3)
Chloride: 102 mmol/L (ref 98–111)
Creatinine, Ser: 0.65 mg/dL (ref 0.44–1.00)
GFR calc Af Amer: >60 mL/min (ref >60)
GFR calc non Af Amer: >60 mL/min (ref >60)
Glucose, Bld: 201 mg/dL — ABNORMAL HIGH (ref 70–99)
Potassium: 3.8 mmol/L (ref 3.5–5.1)
Sodium: 136 mmol/L (ref 135–145)
Total Bilirubin: 0.2 mg/dL — ABNORMAL LOW (ref 0.3–1.2)
Total Protein: 5.6 g/dL — ABNORMAL LOW (ref 6.5–8.1)

## 2019-12-24 LAB — CBC WITH DIFFERENTIAL/PLATELET
Abs Immature Granulocytes: 0.1 10*3/uL — ABNORMAL HIGH (ref 0.00–0.07)
Basophils Absolute: 0 10*3/uL (ref 0.0–0.1)
Basophils Relative: 0 %
Eosinophils Absolute: 0 10*3/uL (ref 0.0–0.5)
Eosinophils Relative: 0 %
HCT: 35.6 % — ABNORMAL LOW (ref 36.0–46.0)
Hemoglobin: 11.8 g/dL — ABNORMAL LOW (ref 12.0–15.0)
Immature Granulocytes: 1 %
Lymphocytes Relative: 9 %
Lymphs Abs: 0.9 10*3/uL (ref 0.7–4.0)
MCH: 27.3 pg (ref 26.0–34.0)
MCHC: 33.1 g/dL (ref 30.0–36.0)
MCV: 82.2 fL (ref 80.0–100.0)
Monocytes Absolute: 0.5 10*3/uL (ref 0.1–1.0)
Monocytes Relative: 5 %
Neutro Abs: 7.9 10*3/uL — ABNORMAL HIGH (ref 1.7–7.7)
Neutrophils Relative %: 85 %
Platelets: 314 10*3/uL (ref 150–400)
RBC: 4.33 MIL/uL (ref 3.87–5.11)
RDW: 13.8 % (ref 11.5–15.5)
WBC: 9.3 10*3/uL (ref 4.0–10.5)
nRBC: 0 % (ref 0.0–0.2)

## 2019-12-24 LAB — C-REACTIVE PROTEIN: CRP: 4.8 mg/dL — ABNORMAL HIGH (ref <1.0)

## 2019-12-24 LAB — D-DIMER, QUANTITATIVE: D-Dimer, Quant: 1.5 ug/mL-FEU — ABNORMAL HIGH (ref 0.00–0.50)

## 2019-12-24 LAB — GLUCOSE, CAPILLARY
Glucose-Capillary: 181 mg/dL — ABNORMAL HIGH (ref 70–99)
Glucose-Capillary: 236 mg/dL — ABNORMAL HIGH (ref 70–99)

## 2019-12-24 LAB — FERRITIN: Ferritin: 599 ng/mL — ABNORMAL HIGH (ref 11–307)

## 2019-12-24 MED ORDER — PREDNISONE 10 MG PO TABS: ORAL_TABLET | ORAL | 0 refills | Status: DC

## 2019-12-24 MED ORDER — ALBUTEROL SULFATE HFA 108 (90 BASE) MCG/ACT IN AERS: 2.0000 | INHALATION_SPRAY | Freq: Four times a day (QID) | RESPIRATORY_TRACT | 0 refills | Status: DC | PRN

## 2019-12-24 MED ORDER — BENZONATATE 200 MG PO CAPS: 200.0000 mg | ORAL_CAPSULE | Freq: Three times a day (TID) | ORAL | 0 refills | Status: DC | PRN

## 2019-12-24 MED ORDER — HYDROCOD POLST-CPM POLST ER 10-8 MG/5ML PO SUER: 5.0000 mL | Freq: Two times a day (BID) | ORAL | 0 refills | Status: DC | PRN

## 2019-12-24 MED ORDER — AMOXICILLIN-POT CLAVULANATE 875-125 MG PO TABS
1.0000 | ORAL_TABLET | Freq: Two times a day (BID) | ORAL | Status: AC
  Administered 2019-12-24: 1 via ORAL
  Filled 2019-12-24: qty 1

## 2019-12-24 MED ORDER — AMOXICILLIN-POT CLAVULANATE 875-125 MG PO TABS: 1.0000 | ORAL_TABLET | Freq: Two times a day (BID) | ORAL | 0 refills | Status: AC

## 2019-12-24 MED ORDER — SODIUM CHLORIDE 0.9 % IV SOLN
500.0000 mg | INTRAVENOUS | Status: DC
  Administered 2019-12-24: 500 mg via INTRAVENOUS
  Filled 2019-12-24: qty 500

## 2019-12-24 NOTE — Progress Notes (Signed)
Occupational Therapy Treatment Patient Details Name: Erika Brown MRN: [id number] DOB: Jul 18, 1941 Today's Date: 12/24/2019    History of present illness 79 y.o. female with medical history significant of CAD status post stenting x5, history of skin cancer, history of cataracts, depression and anxiety, history of IFG, history of hypertension, hyperlipidemia, vitamin D deficiency as well as other comorbidities who presents with a chief complaint of worsening shortness of breath, cough and fevers for last week or so.   OT comments  Pt progressing towards established OT goals and continues to be very motivated to participate in therapy. Providing pt with education and handout on [facility] for ADLs and IADLs. Pt verbalized understanding. Pt performing toileting and functional mobility in hallway with Supervision. SpO2 dropping to 80s on 4L O2; pt able to recover to 90% with increased time, standing rest breaks, and purse lip breathing. Answered questions and provided education in preparation for dc later today. All acute OT needs met.    Follow Up Recommendations  No OT follow up;Supervision - Intermittent    Equipment Recommendations  None recommended by OT    Recommendations for Other Services PT consult    Precautions / Restrictions Precautions Precautions: Fall Restrictions Weight Bearing Restrictions: No       Mobility Bed Mobility Overal bed mobility: Modified Independent             General bed mobility comments: Increased time  Transfers Overall transfer level: Needs assistance Equipment used: None Transfers: Sit to/from Stand;Stand Pivot Transfers Sit to Stand: Supervision Stand pivot transfers: Supervision       General transfer comment: Supervision for safety    Balance Overall balance assessment: No apparent balance deficits (not formally assessed)                                         ADL either performed or assessed with clinical judgement    ADL Overall ADL's : Needs assistance/impaired                                       General ADL Comments: Pt performing ADLs at Supervision level. Pt verbalizing techniques she used during shower. Provided education and handout on [facility] for ADLs and iADLs; Pt verbalized understanding.     Vision       Perception     Praxis      Cognition Arousal/Alertness: Awake/alert Behavior During Therapy: WFL for tasks assessed/performed Overall Cognitive Status: Within Functional Limits for tasks assessed                                          Exercises Exercises: Other exercises Other Exercises Other Exercises: reinforced use of incentive spirometer Other Exercises: reinforced use of flutter valve   Shoulder Instructions       General Comments Spo2 dropping to 84% on 2L with activity. Elevated to 4L. Pt dropping to 80s during mobility in hallway and required standing rest break and purse lip breathing to return to 90%    Pertinent Vitals/ Pain       Pain Assessment: No/denies pain  Home Living  Prior Functioning/Environment              Frequency  Min 2X/week        Progress Toward Goals  OT Goals(current goals can now be found in the care plan section)  Progress towards OT goals: Progressing toward goals  Acute Rehab OT Goals Patient Stated Goal: to go home soon OT Goal Formulation: With patient Time For Goal Achievement: 01/05/20 Potential to Achieve Goals: Good ADL Goals Additional ADL Goal #1: Pt will independently perform ADLs Additional ADL Goal #2: Pt will independently monitor SpO2 and utilize purse lip breathing during ADLs Additional ADL Goal #3: Pt will independently verbalize three energy conservation strategies for ADLs and IADLs  Plan Discharge plan remains appropriate    Co-evaluation                 AM-PAC OT "6 Clicks" Daily Activity      Outcome Measure   Help from another person eating meals?: None Help from another person taking care of personal grooming?: None Help from another person toileting, which includes using toliet, bedpan, or urinal?: None Help from another person bathing (including washing, rinsing, drying)?: None Help from another person to put on and taking off regular upper body clothing?: None Help from another person to put on and taking off regular lower body clothing?: None 6 Click Score: 24    End of Session Equipment Utilized During Treatment: Oxygen(4L)  OT Visit Diagnosis: Unsteadiness on feet (R26.81);Other abnormalities of gait and mobility (R26.89);Muscle weakness (generalized) (M62.81)   Activity Tolerance Patient tolerated treatment well   Patient Left in chair;with call bell/phone within reach   Nurse Communication Mobility status        Time: 1115-1150 OT Time Calculation (min): 35 min  Charges: OT General Charges $OT Visit: 1 Visit OT Treatments $Self Care/Home Management : 23-37 mins  [place], OTR/L Acute Rehab Pager: [phone] Office: [place] 12/24/2019, 1:27 PM

## 2019-12-24 NOTE — Care Management Important Message (Signed)
Important Message  Patient Details  Name: COVA ROBLEZ MRN: [id number] Date of Birth: [birth year]   Medicare Important Message Given:  Yes - Important Message mailed due to current National Emergency  Verbal consent obtained due to current National Emergency  Relationship to patient: Child Contact Name: Delrae Alfred Call Date: 12/24/19  Time: 1533 Phone: [phone] Outcome: No Answer/Busy Important Message mailed to: Patient address on file    Delorse Lek 12/24/2019, 3:33 PM

## 2019-12-24 NOTE — Progress Notes (Signed)
Erika Brown has been updated on the status of her discharge. Daughter awaiting arrival of home concentrator in order to discharge home with oxygen. Patient made aware that she will have to remain at [facility] until the oxygen arrives. Patient is understanding and is currently working with therapy.

## 2019-12-24 NOTE — Discharge Summary (Addendum)
PATIENT DETAILS Name: Erika Brown Age: 79 y.o. Sex: female Date of Birth: [birth year] MRN: [id number]. Admitting Physician: Kerney Elbe, DO [id number], Malka So, MD  Admit Date: 12/20/2019 Discharge date: 12/24/2019  Recommendations for Outpatient Follow-up:  1. Follow up with PCP in 1-2 weeks 2. Please obtain CMP/CBC in one week 3. Repeat Chest Xray in 4-6 week 4. PCP to re-assess whether patient still requires O2 at next visit  Admitted From:  Home  Disposition: Bath: No  Equipment/Devices: O2-2l/m at rest and 4L/m with ambulation  Discharge Condition: Stable  CODE STATUS: FULL CODE  Diet recommendation:  Diet Order            Diet - low sodium heart healthy        Diet Heart Room service appropriate? Yes; Fluid consistency: Thin  Diet effective now               Brief Summary: Patient is a 79 y.o. female with PMHx of CAD s/p PCI, depression, anxiety, HTN, HLD who presented to the ED with perioral cyanosis after a coughing spell-was found to have acute hypoxic respiratory failure secondary to COVID-19 pneumonia.  Her husband recently passed away from COVID-19.  Significant events: 1/17>> Covid positive test at [facility] (per family) 1/21>>bamlanivimab infusion (just prior to this hospitalization)  Brief Hospital Course: Acute Hypoxic Resp Failure due to Covid 19 Viral pneumonia and concurrent bacterial pneumonia: Much improved-either on room air or just on 1-2L at rest (on 6L on initial presentation), but requires around 4L with ambulation. CRP is but trending down.  Will complete remdesivir 1/25, following which patient will be discharged home on tapering steroids and antibiotics for 2 additional days . Follow with PCP to reassess Home O2 requirement in a few weeks.  COVID-19 Labs:  Recent Labs    12/22/19 0054 12/23/19 0325 12/24/19 0249  DDIMER 2.89* 1.90* 1.50*  FERRITIN 663* 635* 599*  CRP 19.3* 9.0* 4.8*    Lab Results   Component Value Date   SARSCOV2NAA POSITIVE (A) 12/20/2019   [place] Not Detected 12/11/2019     COVID-19 Medications: Steroids: 1/21>> Remdesivir: 1/21>>1/25  Other medications: Diuretics:Euvolemic-no need for lasix Antibiotics: Augmentin 1/26>>1/27 Rocephin: 1/21>>1/25 Zithromax: 1/21>>1/25  CAD: No anginal symptoms-continue aspirin/Plavix/metoprolol.  HTN: Controlled-continue amlodipine-resume Avapro/HCTZ  HLD: Continue Zetia  History of prediabetes with hyperglycemia due to steroids (A1c 6.1): CBGs elevated as patient on steroids-suspect will continue to improve as steroids get tapered  Procedures/Studies: None  Discharge Diagnoses:  Active Problems:   Hyperlipidemia LDL goal <70   Essential hypertension   Coronary artery disease involving native coronary artery of native heart without angina pectoris   Pure hypercholesterolemia   CAD (coronary artery disease)   Vitamin D deficiency, unspecified   Pneumonia due to COVID-19 virus   Hyponatremia   Hypochloremia   Discharge Instructions:    Person Under Monitoring Name: Erika Brown  Location: [address] [place] [address]   Infection Prevention Recommendations for Individuals Confirmed to have, or Being Evaluated for, 2019 Novel Coronavirus (COVID-19) Infection Who Receive Care at Home  Individuals who are confirmed to have, or are being evaluated for, COVID-19 should follow the prevention steps below until a healthcare provider or local or state health department says they can return to normal activities.  Stay home except to get medical care You should restrict activities outside your home, except for getting medical care. Do not go to work, school, or public areas, and  do not use public transportation or taxis.  Call ahead before visiting your doctor Before your medical appointment, call the healthcare provider and tell them that you have, or are being evaluated for, COVID-19  infection. This will help the healthcare provider's office take steps to keep other people from getting infected. Ask your healthcare provider to call the local or state health department.  Monitor your symptoms Seek prompt medical attention if your illness is worsening (e.g., difficulty breathing). Before going to your medical appointment, call the healthcare provider and tell them that you have, or are being evaluated for, COVID-19 infection. Ask your healthcare provider to call the local or state health department.  Wear a facemask You should wear a facemask that covers your nose and mouth when you are in the same room with other people and when you visit a healthcare provider. People who live with or visit you should also wear a facemask while they are in the same room with you.  Separate yourself from other people in your home As much as possible, you should stay in a different room from other people in your home. Also, you should use a separate bathroom, if available.  Avoid sharing household items You should not share dishes, drinking glasses, cups, eating utensils, towels, bedding, or other items with other people in your home. After using these items, you should wash them thoroughly with soap and water.  Cover your coughs and sneezes Cover your mouth and nose with a tissue when you cough or sneeze, or you can cough or sneeze into your sleeve. Throw used tissues in a lined trash can, and immediately wash your hands with soap and water for at least 20 seconds or use an alcohol-based hand rub.  Wash your [facility] your hands often and thoroughly with soap and water for at least 20 seconds. You can use an alcohol-based hand sanitizer if soap and water are not available and if your hands are not visibly dirty. Avoid touching your eyes, nose, and mouth with unwashed hands.   Prevention Steps for Caregivers and Household Members of Individuals Confirmed to have, or Being Evaluated  for, COVID-19 Infection Being Cared for in the Home  If you live with, or provide care at home for, a person confirmed to have, or being evaluated for, COVID-19 infection please follow these guidelines to prevent infection:  Follow healthcare provider's instructions Make sure that you understand and can help the patient follow any healthcare provider instructions for all care.  Provide for the patient's basic needs You should help the patient with basic needs in the home and provide support for getting groceries, prescriptions, and other personal needs.  Monitor the patient's symptoms If they are getting sicker, call his or her medical provider and tell them that the patient has, or is being evaluated for, COVID-19 infection. This will help the healthcare provider's office take steps to keep other people from getting infected. Ask the healthcare provider to call the local or state health department.  Limit the number of people who have contact with the patient  If possible, have only one caregiver for the patient.  Other household members should stay in another home or place of residence. If this is not possible, they should stay  in another room, or be separated from the patient as much as possible. Use a separate bathroom, if available.  Restrict visitors who do not have an essential need to be in the home.  Keep older adults, very young children,  and other sick people away from the patient Keep older adults, very young children, and those who have compromised immune systems or chronic health conditions away from the patient. This includes people with chronic heart, lung, or kidney conditions, diabetes, and cancer.  Ensure good ventilation Make sure that shared spaces in the home have good air flow, such as from an air conditioner or an opened window, weather permitting.  Wash your hands often  Wash your hands often and thoroughly with soap and water for at least 20 seconds. You  can use an alcohol based hand sanitizer if soap and water are not available and if your hands are not visibly dirty.  Avoid touching your eyes, nose, and mouth with unwashed hands.  Use disposable paper towels to dry your hands. If not available, use dedicated cloth towels and replace them when they become wet.  Wear a facemask and gloves  Wear a disposable facemask at all times in the room and gloves when you touch or have contact with the patient's blood, body fluids, and/or secretions or excretions, such as sweat, saliva, sputum, nasal mucus, vomit, urine, or feces.  Ensure the mask fits over your nose and mouth tightly, and do not touch it during use.  Throw out disposable facemasks and gloves after using them. Do not reuse.  Wash your hands immediately after removing your facemask and gloves.  If your personal clothing becomes contaminated, carefully remove clothing and launder. Wash your hands after handling contaminated clothing.  Place all used disposable facemasks, gloves, and other waste in a lined container before disposing them with other household waste.  Remove gloves and wash your hands immediately after handling these items.  Do not share dishes, glasses, or other household items with the patient  Avoid sharing household items. You should not share dishes, drinking glasses, cups, eating utensils, towels, bedding, or other items with a patient who is confirmed to have, or being evaluated for, COVID-19 infection.  After the person uses these items, you should wash them thoroughly with soap and water.  Wash laundry thoroughly  Immediately remove and wash clothes or bedding that have blood, body fluids, and/or secretions or excretions, such as sweat, saliva, sputum, nasal mucus, vomit, urine, or feces, on them.  Wear gloves when handling laundry from the patient.  Read and follow directions on labels of laundry or clothing items and detergent. In general, wash and dry with  the warmest temperatures recommended on the label.  Clean all areas the individual has used often  Clean all touchable surfaces, such as counters, tabletops, doorknobs, bathroom fixtures, toilets, phones, keyboards, tablets, and bedside tables, every day. Also, clean any surfaces that may have blood, body fluids, and/or secretions or excretions on them.  Wear gloves when cleaning surfaces the patient has come in contact with.  Use a diluted bleach solution (e.g., dilute bleach with 1 part bleach and 10 parts water) or a household disinfectant with a label that says EPA-registered for coronaviruses. To make a bleach solution at home, add 1 tablespoon of bleach to 1 quart (4 cups) of water. For a larger supply, add  cup of bleach to 1 gallon (16 cups) of water.  Read labels of cleaning products and follow recommendations provided on product labels. Labels contain instructions for safe and effective use of the cleaning product including precautions you should take when applying the product, such as wearing gloves or eye protection and making sure you have good ventilation during use of the product.  Remove gloves and wash hands immediately after cleaning.  Monitor yourself for signs and symptoms of illness Caregivers and household members are considered close contacts, should monitor their health, and will be asked to limit movement outside of the home to the extent possible. Follow the monitoring steps for close contacts listed on the symptom monitoring form.   ? If you have additional questions, contact your local health department or call the epidemiologist on call at [phone] (available 24/7). ? This guidance is subject to change. For the most up-to-date guidance from CDC, please refer to their website: YouBlogs.pl    Activity:  As tolerated   Discharge Instructions    Call MD for:  difficulty breathing, headache or  visual disturbances   Complete by: As directed    Call MD for:  extreme fatigue   Complete by: As directed    Call MD for:  persistant dizziness or light-headedness   Complete by: As directed    Call MD for:  persistant nausea and vomiting   Complete by: As directed    Diet - low sodium heart healthy   Complete by: As directed    Discharge instructions   Complete by: As directed    Follow with Primary MD  Martinique, Betty G, MD in 1-2 weeks  Please get a complete blood count and chemistry panel checked by your Primary MD at your next visit, and again as instructed by your Primary MD.  Get Medicines reviewed and adjusted: Please take all your medications with you for your next visit with your Primary MD  Laboratory/radiological data: Please request your Primary MD to go over all hospital tests and procedure/radiological results at the follow up, please ask your Primary MD to get all Hospital records sent to his/her office.  In some cases, they will be blood work, cultures and biopsy results pending at the time of your discharge. Please request that your primary care M.D. follows up on these results.  Also Note the following: If you experience worsening of your admission symptoms, develop shortness of breath, life threatening emergency, suicidal or homicidal thoughts you must seek medical attention immediately by calling 911 or calling your MD immediately  if symptoms less severe.  You must read complete instructions/literature along with all the possible adverse reactions/side effects for all the Medicines you take and that have been prescribed to you. Take any new Medicines after you have completely understood and accpet all the possible adverse reactions/side effects.   Do not drive when taking Pain medications or sleeping medications (Benzodaizepines)  Do not take more than prescribed Pain, Sleep and Anxiety Medications. It is not advisable to combine anxiety,sleep and pain medications  without talking with your primary care practitioner  Special Instructions: If you have smoked or chewed Tobacco  in the last 2 yrs please stop smoking, stop any regular Alcohol  and or any Recreational drug use.  Wear Seat belts while driving.  Please note: You were cared for by a hospitalist during your hospital stay. Once you are discharged, your primary care physician will handle any further medical issues. Please note that NO REFILLS for any discharge medications will be authorized once you are discharged, as it is imperative that you return to your primary care physician (or establish a relationship with a primary care physician if you do not have one) for your post hospital discharge needs so that they can reassess your need for medications and monitor your lab values.   1.) 3 weeks of isolation  from 12/16/19   Increase activity slowly   Complete by: As directed      Allergies as of 12/24/2019      Reactions   Iodine Other (See Comments)   Has had some SHOB and rapid heart rate in the past   Latex Other (See Comments)   discomfort   Other Other (See Comments)   Some foods b/c of preservatives    Statins Other (See Comments)   Intolerance to high dosages, myalgia      Medication List    TAKE these medications   albuterol 108 (90 Base) MCG/ACT inhaler Commonly known as: VENTOLIN HFA Inhale 2 puffs into the lungs every 6 (six) hours as needed for wheezing or shortness of breath.   amLODipine 5 MG tablet Commonly known as: NORVASC TAKE 1 TABLET ONCE DAILY.   amoxicillin-clavulanate 875-125 MG tablet Commonly known as: Augmentin Take 1 tablet by mouth every 12 (twelve) hours for 4 doses. Start taking on: December 25, 2019   aspirin 81 MG EC tablet Take 81 mg by mouth daily.   benzonatate 200 MG capsule Commonly known as: TESSALON Take 1 capsule (200 mg total) by mouth 3 (three) times daily as needed for cough.   chlorpheniramine-HYDROcodone 10-8 MG/5ML Suer Commonly  known as: TUSSIONEX Take 5 mLs by mouth every 12 (twelve) hours as needed for cough.   clopidogrel 75 MG tablet Commonly known as: PLAVIX Take 1 tablet (75 mg total) by mouth daily. Please keep upcoming appt with Dr. Irish Lack in January before anymore refills. Thank you   ezetimibe 10 MG tablet Commonly known as: ZETIA Take 1 tablet (10 mg total) by mouth daily. Please make yearly appt with Dr. Irish Lack for December before anymore refills. 1st attempt   irbesartan-hydrochlorothiazide 300-12.5 MG tablet Commonly known as: AVALIDE TAKE 1 TABLET ONCE DAILY.   meclizine 25 MG tablet Commonly known as: ANTIVERT Take 12.5 mg by mouth 3 (three) times daily as needed for dizziness.   metoprolol succinate 50 MG 24 hr tablet Commonly known as: TOPROL-XL Take 1 tablet (50 mg total) by mouth daily.   nitroGLYCERIN 0.4 MG SL tablet Commonly known as: Nitrostat Place 1 tablet (0.4 mg total) under the tongue every 5 (five) minutes as needed for chest pain.   potassium chloride SA 20 MEQ tablet Commonly known as: KLOR-CON Take 1 tablet (20 mEq total) by mouth daily. Please keep upcoming appt in January with Dr. Irish Lack before anymore refills. Thank you   predniSONE 10 MG tablet Commonly known as: DELTASONE Take 40 mg daily for 1 day, 30 mg daily for 1 day, 20 mg daily for 1 days,10 mg daily for 1 day, then stop   VITAMIN D2 PO Take 1.25 mg by mouth 2 (two) times a week.            Durable Medical Equipment  (From admission, onward)         Start     Ordered   12/24/19 0725  For home use only DME oxygen  Once    Comments: 2 L at rest, 4 L with ambulation  Question Answer Comment  Length of Need 6 Months   Mode or (Route) Nasal cannula   Liters per Minute 4   Frequency Continuous (stationary and portable oxygen unit needed)   Oxygen conserving device Yes   Oxygen delivery system Gas      12/24/19 0725         Follow-up Information    Martinique, Betty G, MD.  Schedule an  appointment as soon as possible for a visit in 1 week(s).   Specialty: Family Medicine Contact information: [place] [address] [phone]        Jettie Booze, MD Follow up in 1 month(s).   Specialties: Cardiology, Radiology, Interventional Cardiology Contact information: [id number] [place] [address] [phone]          Allergies  Allergen Reactions  . Iodine Other (See Comments)    Has had some SHOB and rapid heart rate in the past  . Latex Other (See Comments)    discomfort  . Other Other (See Comments)    Some foods b/c of preservatives   . Statins Other (See Comments)    Intolerance to high dosages, myalgia    Consultations:   None   Other Procedures/Studies: Portable chest 1 View  Result Date: 12/21/2019 CLINICAL DATA:  Shortness of breath. EXAM: PORTABLE CHEST 1 VIEW COMPARISON:  12/20/2019.  CT 03/31/2018. FINDINGS: Mediastinum and hilar structures are unremarkable. Heart size normal. Coronary stents noted. Multifocal bilateral pulmonary infiltrates are again noted without interim change. Bibasilar atelectasis. No pleural effusion or pneumothorax. No acute bony abnormality. IMPRESSION: Multifocal bilateral pulmonary infiltrates are again noted without interim change. Bibasilar atelectasis. Electronically Signed   By: Marcello Moores  Register   On: 12/21/2019 07:04   DG Chest Port 1 View  Result Date: 12/20/2019 CLINICAL DATA:  Short of breath.  Cough. EXAM: PORTABLE CHEST 1 VIEW COMPARISON:  03/30/2018 and 06/12/2015.  Chest CT, 03/31/2018. FINDINGS: There are interstitial and hazy airspace opacities in the lower lungs, new since the prior exams. Upper lungs are clear. Cardiac silhouette is normal in size. Two adjacent left coronary artery stents are noted. No mediastinal or hilar masses or evidence of adenopathy. No convincing pleural effusion and no pneumothorax. Skeletal structures are grossly intact.  IMPRESSION: 1. New interstitial and hazy airspace opacities in the lower lungs. Consider multifocal pneumonia in the proper clinical setting. Findings may reflect inflammation. No evidence of pulmonary edema. Electronically Signed   By: Lajean Manes M.D.   On: 12/20/2019 16:31     TODAY-DAY OF DISCHARGE:  Subjective:   Erika Brown today has no headache,no chest abdominal pain,no new weakness tingling or numbness, feels much better wants to go home today.   Objective:   Blood pressure 131/72, pulse 74, temperature 98.3 F (36.8 C), temperature source Axillary, resp. rate 15, height 5\' 4"  (1.626 m), weight 63.5 kg, last menstrual period 11/29/1974, SpO2 97 %.  Intake/Output Summary (Last 24 hours) at 12/24/2019 1005 Last data filed at 12/24/2019 0400 Gross per 24 hour  Intake 875.89 ml  Output --  Net 875.89 ml   Filed Weights   12/20/19 1538  Weight: 63.5 kg    Exam: Awake Alert, Oriented *3, No new F.N deficits, Normal affect Kraemer.AT,PERRAL Supple Neck,No JVD, No cervical lymphadenopathy appriciated.  Symmetrical Chest wall movement, Good air movement bilaterally, CTAB RRR,No Gallops,Rubs or new Murmurs, No Parasternal Heave +ve B.Sounds, Abd Soft, Non tender, No organomegaly appriciated, No rebound -guarding or rigidity. No Cyanosis, Clubbing or edema, No new Rash or bruise   PERTINENT RADIOLOGIC STUDIES: Portable chest 1 View  Result Date: 12/21/2019 CLINICAL DATA:  Shortness of breath. EXAM: PORTABLE CHEST 1 VIEW COMPARISON:  12/20/2019.  CT 03/31/2018. FINDINGS: Mediastinum and hilar structures are unremarkable. Heart size normal. Coronary stents noted. Multifocal bilateral pulmonary infiltrates are again noted without interim change. Bibasilar atelectasis. No pleural effusion or pneumothorax.  No acute bony abnormality. IMPRESSION: Multifocal bilateral pulmonary infiltrates are again noted without interim change. Bibasilar atelectasis. Electronically Signed   By: Marcello Moores   Register   On: 12/21/2019 07:04   DG Chest Port 1 View  Result Date: 12/20/2019 CLINICAL DATA:  Short of breath.  Cough. EXAM: PORTABLE CHEST 1 VIEW COMPARISON:  03/30/2018 and 06/12/2015.  Chest CT, 03/31/2018. FINDINGS: There are interstitial and hazy airspace opacities in the lower lungs, new since the prior exams. Upper lungs are clear. Cardiac silhouette is normal in size. Two adjacent left coronary artery stents are noted. No mediastinal or hilar masses or evidence of adenopathy. No convincing pleural effusion and no pneumothorax. Skeletal structures are grossly intact. IMPRESSION: 1. New interstitial and hazy airspace opacities in the lower lungs. Consider multifocal pneumonia in the proper clinical setting. Findings may reflect inflammation. No evidence of pulmonary edema. Electronically Signed   By: Lajean Manes M.D.   On: 12/20/2019 16:31     PERTINENT LAB RESULTS: CBC: Recent Labs    12/23/19 0325 12/24/19 0249  WBC 10.2 9.3  HGB 11.4* 11.8*  HCT 35.0* 35.6*  PLT 293 314   CMET CMP     Component Value Date/Time   NA 136 12/24/2019 0249   NA 142 02/23/2018 1607   K 3.8 12/24/2019 0249   CL 102 12/24/2019 0249   CO2 25 12/24/2019 0249   GLUCOSE 201 (H) 12/24/2019 0249   BUN 26 (H) 12/24/2019 0249   BUN 19 02/23/2018 1607   CREATININE 0.65 12/24/2019 0249   CREATININE 0.93 10/05/2016 1009   CALCIUM 8.6 (L) 12/24/2019 0249   PROT 5.6 (L) 12/24/2019 0249   PROT 6.7 01/26/2019 0921   ALBUMIN 2.5 (L) 12/24/2019 0249   ALBUMIN 4.2 01/26/2019 0921   AST 37 12/24/2019 0249   ALT 52 (H) 12/24/2019 0249   ALKPHOS 63 12/24/2019 0249   BILITOT 0.2 (L) 12/24/2019 0249   BILITOT 0.5 01/26/2019 0921   GFRNONAA >60 12/24/2019 0249   GFRAA >60 12/24/2019 0249    GFR Estimated Creatinine Clearance: 48.4 mL/min (by C-G formula based on SCr of 0.65 mg/dL). No results for input(s): LIPASE, AMYLASE in the last 72 hours. No results for input(s): CKTOTAL, CKMB, CKMBINDEX,  TROPONINI in the last 72 hours. Invalid input(s): POCBNP Recent Labs    12/23/19 0325 12/24/19 0249  DDIMER 1.90* 1.50*   No results for input(s): HGBA1C in the last 72 hours. No results for input(s): CHOL, HDL, LDLCALC, TRIG, CHOLHDL, LDLDIRECT in the last 72 hours. No results for input(s): TSH, T4TOTAL, T3FREE, THYROIDAB in the last 72 hours.  Invalid input(s): FREET3 Recent Labs    12/23/19 0325 12/24/19 0249  FERRITIN 635* 599*   Coags: No results for input(s): INR in the last 72 hours.  Invalid input(s): PT Microbiology: Recent Results (from the past 240 hour(s))  Blood Culture (routine x 2)     Status: None (Preliminary result)   Collection Time: 12/20/19  4:12 PM   Specimen: BLOOD  Result Value Ref Range Status   Specimen Description BLOOD LEFT ANTECUBITAL  Final   Special Requests   Final    BOTTLES DRAWN AEROBIC AND ANAEROBIC Blood Culture adequate volume   Culture   Final    NO GROWTH 4 DAYS Performed at [facility], [address] [place], [place] [address]    Report Status PENDING  Incomplete  Blood Culture (routine x 2)     Status: Abnormal   Collection Time: 12/20/19  6:06  PM   Specimen: BLOOD LEFT HAND  Result Value Ref Range Status   Specimen Description BLOOD LEFT HAND  Final   Special Requests   Final    BOTTLES DRAWN AEROBIC AND ANAEROBIC Blood Culture adequate volume   Culture  Setup Time   Final    GRAM POSITIVE COCCI IN CLUSTERS IN BOTH AEROBIC AND ANAEROBIC BOTTLES CRITICAL RESULT CALLED TO, READ BACK BY AND VERIFIED WITH: PHARMD M. PHAM [id number] FCP    Culture (A)  Final    STAPHYLOCOCCUS SPECIES (COAGULASE NEGATIVE) THE SIGNIFICANCE OF ISOLATING THIS ORGANISM FROM A SINGLE SET OF BLOOD CULTURES WHEN MULTIPLE SETS ARE DRAWN IS UNCERTAIN. PLEASE NOTIFY THE MICROBIOLOGY DEPARTMENT WITHIN ONE WEEK IF SPECIATION AND SENSITIVITIES ARE REQUIRED. Performed at [facility], [place] [address], [place], [place] [address]    Report  Status 12/23/2019 FINAL  Final  Respiratory Panel by RT PCR (Flu A&B, Covid) - Nasopharyngeal Swab     Status: Abnormal   Collection Time: 12/20/19  6:12 PM   Specimen: Nasopharyngeal Swab  Result Value Ref Range Status   SARS Coronavirus 2 by RT PCR POSITIVE (A) NEGATIVE Final    Comment: RESULT CALLED TO, READ BACK BY AND VERIFIED WITH: K SPRANGERS RN 12/20/19 JDW 2035 (NOTE) SARS-CoV-2 target nucleic acids are DETECTED. SARS-CoV-2 RNA is generally detectable in upper respiratory specimens  during the acute phase of infection. Positive results are indicative of the presence of the identified virus, but do not rule out bacterial infection or co-infection with other pathogens not detected by the test. Clinical correlation with patient history and other diagnostic information is necessary to determine patient infection status. The expected result is Negative. Fact Sheet for Patients:  PinkCheek.be Fact Sheet for Healthcare Providers: GravelBags.it This test is not yet approved or cleared by the [place] FDA and  has been authorized for detection and/or diagnosis of SARS-CoV-2 by FDA under an Emergency Use Authorization (EUA).  This EUA will remain in effect (meaning this test can be used) for t he duration of  the COVID-19 declaration under Section 564(b)(1) of the Act, 21 U.S.C. section 360bbb-3(b)(1), unless the authorization is terminated or revoked sooner.    Influenza A by PCR NEGATIVE NEGATIVE Final   Influenza B by PCR NEGATIVE NEGATIVE Final    Comment: (NOTE) The Xpert Xpress SARS-CoV-2/FLU/RSV assay is intended as an aid in  the diagnosis of influenza from Nasopharyngeal swab specimens and  should not be used as a sole basis for treatment. Nasal washings and  aspirates are unacceptable for Xpert Xpress SARS-CoV-2/FLU/RSV  testing. Fact Sheet for Patients: PinkCheek.be Fact Sheet  for Healthcare Providers: GravelBags.it This test is not yet approved or cleared by the [place] FDA and  has been authorized for detection and/or diagnosis of SARS-CoV-2 by  FDA under an Emergency Use Authorization (EUA). This EUA will remain  in effect (meaning this test can be used) for the duration of the  Covid-19 declaration under Section 564(b)(1) of the Act, 21  U.S.C. section 360bbb-3(b)(1), unless the authorization is  terminated or revoked. Performed at [facility], [place] [address] [place], [place] [address]     FURTHER DISCHARGE INSTRUCTIONS:  Get Medicines reviewed and adjusted: Please take all your medications with you for your next visit with your Primary MD  Laboratory/radiological data: Please request your Primary MD to go over all hospital tests and procedure/radiological results at the follow up, please ask your Primary MD to get all [facility]  records sent to his/her office.  In some cases, they will be blood work, cultures and biopsy results pending at the time of your discharge. Please request that your primary care M.D. goes through all the records of your hospital data and follows up on these results.  Also Note the following: If you experience worsening of your admission symptoms, develop shortness of breath, life threatening emergency, suicidal or homicidal thoughts you must seek medical attention immediately by calling 911 or calling your MD immediately  if symptoms less severe.  You must read complete instructions/literature along with all the possible adverse reactions/side effects for all the Medicines you take and that have been prescribed to you. Take any new Medicines after you have completely understood and accpet all the possible adverse reactions/side effects.   Do not drive when taking Pain medications or sleeping medications (Benzodaizepines)  Do not take more than prescribed Pain, Sleep and Anxiety  Medications. It is not advisable to combine anxiety,sleep and pain medications without talking with your primary care practitioner  Special Instructions: If you have smoked or chewed Tobacco  in the last 2 yrs please stop smoking, stop any regular Alcohol  and or any Recreational drug use.  Wear Seat belts while driving.  Please note: You were cared for by a hospitalist during your hospital stay. Once you are discharged, your primary care physician will handle any further medical issues. Please note that NO REFILLS for any discharge medications will be authorized once you are discharged, as it is imperative that you return to your primary care physician (or establish a relationship with a primary care physician if you do not have one) for your post hospital discharge needs so that they can reassess your need for medications and monitor your lab values.  Total Time spent coordinating discharge including counseling, education and face to face time equals 35 minutes.  SignedOren Binet 12/24/2019 10:05 AM

## 2019-12-24 NOTE — Progress Notes (Signed)
Inpatient Diabetes Program Recommendations  AACE/ADA: New Consensus Statement on Inpatient Glycemic Control   Target Ranges:  Prepandial:   less than 140 mg/dL      Peak postprandial:   less than 180 mg/dL (1-2 hours)      Critically ill patients:  140 - 180 mg/dL   Results for LARK, TORBECK (MRN [id number]) as of 12/24/2019 11:59  Ref. Range 12/23/2019 08:33 12/23/2019 11:45 12/23/2019 16:54 12/23/2019 21:38 12/24/2019 07:41 12/24/2019 11:25  Glucose-Capillary Latest Ref Range: 70 - 99 mg/dL 222 (H) 263 (H) 302 (H) 311 (H) 181 (H) 236 (H)   Review of Glycemic Control  Current orders for Inpatient glycemic control: Novolog 0-9 units TID with meals, Novolog 0-5 units QHS; Solumedrol 20 mg Q12H  Inpatient Diabetes Program Recommendations:    Insulin-Meal Coverage: If steroids are continued, please consider ordering Novolog 3 units TID with meals for meal coverage if patient eats at least 50% of meals.  Thanks, Barnie Alderman, RN, MSN, CDE Diabetes Coordinator Inpatient Diabetes Program [phone] (Team Pager from 8am to 5pm)

## 2019-12-24 NOTE — TOC Transition Note (Addendum)
Transition of Care [facility]) - CM/SW Discharge Note   Patient Details  Name: Erika Brown MRN: [id number] Date of Birth: Mar 11, 1941  Transition of Care [facility] CM/SW Contact:  Tylee Newby, Chauncey Reading, RN Phone Number: 12/24/2019, 10:28 AM   Clinical Narrative:   Referred to [facility] for oxygen. Patient will need to remain at [facility] until confirmation of home concentrator has arrived. Daughter to wait at home for concentrator and bring e tanks with her for transport home. Updated daughter on plan.     Final next level of care: Home/Self Care      Discharge Plan and Services                  DME Agency: [facility] Date DME Agency Contacted: 12/24/19 Time DME Agency Contacted: [phone] Representative spoke with at DME Agency: [place] Interventions     Readmission Risk Interventions No flowsheet data found.

## 2019-12-24 NOTE — Progress Notes (Signed)
Erika Brown given printed discharge instructions. The teach back method was utilized to ensure understanding. Daughter has been called and is on her way to pick up her mother with the oxygen concentrator.

## 2019-12-24 NOTE — Discharge Instructions (Addendum)
10 Things You Can Do to Manage Your COVID-19 Symptoms at Home If you have possible or confirmed COVID-19: 1. Stay home from work and school. And stay away from other public places. If you must go out, avoid using any kind of public transportation, ridesharing, or taxis. 2. Monitor your symptoms carefully. If your symptoms get worse, call your healthcare provider immediately. 3. Get rest and stay hydrated. 4. If you have a medical appointment, call the healthcare provider ahead of time and tell them that you have or may have COVID-19. 5. For medical emergencies, call 911 and notify the dispatch personnel that you have or may have COVID-19. 6. Cover your cough and sneezes with a tissue or use the inside of your elbow. 7. Wash your hands often with soap and water for at least 20 seconds or clean your hands with an alcohol-based hand sanitizer that contains at least 60% alcohol. 8. As much as possible, stay in a specific room and away from other people in your home. Also, you should use a separate bathroom, if available. If you need to be around other people in or outside of the home, wear a mask. 9. Avoid sharing personal items with other people in your household, like dishes, towels, and bedding. 10. Clean all surfaces that are touched often, like counters, tabletops, and doorknobs. Use household cleaning sprays or wipes according to the label instructions. [email or web address].[email or web address] 05/30/2019 This information is not intended to replace advice given to you by your health care provider. Make sure you discuss any questions you have with your health care provider. Document Revised: 11/01/2019 Document Reviewed: 11/01/2019 Elsevier Patient Education  [place].  COVID-19: How to Protect Yourself and Others Know how it spreads  There is currently no vaccine to prevent coronavirus disease 2019 (COVID-19).  The best way to prevent illness is to avoid being exposed to this virus.  The virus is  thought to spread mainly from person-to-person. ? Between people who are in close contact with one another (within about 6 feet). ? Through respiratory droplets produced when an infected person coughs, sneezes or talks. ? These droplets can land in the mouths or noses of people who are nearby or possibly be inhaled into the lungs. ? COVID-19 may be spread by people who are not showing symptoms. Everyone should Clean your hands often  Wash your hands often with soap and water for at least 20 seconds especially after you have been in a public place, or after blowing your nose, coughing, or sneezing.  If soap and water are not readily available, use a hand sanitizer that contains at least 60% alcohol. Cover all surfaces of your hands and rub them together until they feel dry.  Avoid touching your eyes, nose, and mouth with unwashed hands. Avoid close contact  Limit contact with others as much as possible.  Avoid close contact with people who are sick.  Put distance between yourself and other people. ? Remember that some people without symptoms may be able to spread virus. ? This is especially important for people who are at higher risk of getting very GainPain.com.cy Cover your mouth and nose with a mask when around others  You could spread COVID-19 to others even if you do not feel sick.  Everyone should wear a mask in public settings and when around people not living in their household, especially when social distancing is difficult to maintain. ? Masks should not be placed on young children under age 36, anyone who  has trouble breathing, or is unconscious, incapacitated or otherwise unable to remove the mask without assistance.  The mask is meant to protect other people in case you are infected.  Do NOT use a facemask meant for a Dietitian.  Continue to keep about 6 feet between yourself and others. The  mask is not a substitute for social distancing. Cover coughs and sneezes  Always cover your mouth and nose with a tissue when you cough or sneeze or use the inside of your elbow.  Throw used tissues in the trash.  Immediately wash your hands with soap and water for at least 20 seconds. If soap and water are not readily available, clean your hands with a hand sanitizer that contains at least 60% alcohol. Clean and disinfect  Clean AND disinfect frequently touched surfaces daily. This includes tables, doorknobs, light switches, countertops, handles, desks, phones, keyboards, toilets, faucets, and sinks. RackRewards.fr  If surfaces are dirty, clean them: Use detergent or soap and water prior to disinfection.  Then, use a household disinfectant. You can see a list of EPA-registered household disinfectants here. [email or web address].[email or web address] 08/01/2019 This information is not intended to replace advice given to you by your health care provider. Make sure you discuss any questions you have with your health care provider. Document Revised: 08/09/2019 Document Reviewed: 06/07/2019 Elsevier Patient Education  [place] Under Monitoring Name: Erika Brown  Location: [employer] [place] [address]   Infection Prevention Recommendations for Individuals Confirmed to have, or Being Evaluated for, 2019 Novel Coronavirus (COVID-19) Infection Who Receive Care at Home  Individuals who are confirmed to have, or are being evaluated for, COVID-19 should follow the prevention steps below until a healthcare provider or local or state health department says they can return to normal activities.  Stay home except to get medical care You should restrict activities outside your home, except for getting medical care. Do not go to work, school, or public areas, and do not use public transportation or taxis.  Call  ahead before visiting your doctor Before your medical appointment, call the healthcare provider and tell them that you have, or are being evaluated for, COVID-19 infection. This will help the healthcare provider's office take steps to keep other people from getting infected. Ask your healthcare provider to call the local or state health department.  Monitor your symptoms Seek prompt medical attention if your illness is worsening (e.g., difficulty breathing). Before going to your medical appointment, call the healthcare provider and tell them that you have, or are being evaluated for, COVID-19 infection. Ask your healthcare provider to call the local or state health department.  Wear a facemask You should wear a facemask that covers your nose and mouth when you are in the same room with other people and when you visit a healthcare provider. People who live with or visit you should also wear a facemask while they are in the same room with you.  Separate yourself from other people in your home As much as possible, you should stay in a different room from other people in your home. Also, you should use a separate bathroom, if available.  Avoid sharing household items You should not share dishes, drinking glasses, cups, eating utensils, towels, bedding, or other items with other people in your home. After using these items, you should wash them thoroughly with soap and water.  Cover your coughs and sneezes Cover your mouth and nose with a tissue when you cough  or sneeze, or you can cough or sneeze into your sleeve. Throw used tissues in a lined trash can, and immediately wash your hands with soap and water for at least 20 seconds or use an alcohol-based hand rub.  Wash your [facility] your hands often and thoroughly with soap and water for at least 20 seconds. You can use an alcohol-based hand sanitizer if soap and water are not available and if your hands are not visibly dirty. Avoid  touching your eyes, nose, and mouth with unwashed hands.   Prevention Steps for Caregivers and Household Members of Individuals Confirmed to have, or Being Evaluated for, COVID-19 Infection Being Cared for in the Home  If you live with, or provide care at home for, a person confirmed to have, or being evaluated for, COVID-19 infection please follow these guidelines to prevent infection:  Follow healthcare provider's instructions Make sure that you understand and can help the patient follow any healthcare provider instructions for all care.  Provide for the patient's basic needs You should help the patient with basic needs in the home and provide support for getting groceries, prescriptions, and other personal needs.  Monitor the patient's symptoms If they are getting sicker, call his or her medical provider and tell them that the patient has, or is being evaluated for, COVID-19 infection. This will help the healthcare provider's office take steps to keep other people from getting infected. Ask the healthcare provider to call the local or state health department.  Limit the number of people who have contact with the patient  If possible, have only one caregiver for the patient.  Other household members should stay in another home or place of residence. If this is not possible, they should stay  in another room, or be separated from the patient as much as possible. Use a separate bathroom, if available.  Restrict visitors who do not have an essential need to be in the home.  Keep older adults, very young children, and other sick people away from the patient Keep older adults, very young children, and those who have compromised immune systems or chronic health conditions away from the patient. This includes people with chronic heart, lung, or kidney conditions, diabetes, and cancer.  Ensure good ventilation Make sure that shared spaces in the home have good air flow, such as from an  air conditioner or an opened window, weather permitting.  Wash your hands often  Wash your hands often and thoroughly with soap and water for at least 20 seconds. You can use an alcohol based hand sanitizer if soap and water are not available and if your hands are not visibly dirty.  Avoid touching your eyes, nose, and mouth with unwashed hands.  Use disposable paper towels to dry your hands. If not available, use dedicated cloth towels and replace them when they become wet.  Wear a facemask and gloves  Wear a disposable facemask at all times in the room and gloves when you touch or have contact with the patient's blood, body fluids, and/or secretions or excretions, such as sweat, saliva, sputum, nasal mucus, vomit, urine, or feces.  Ensure the mask fits over your nose and mouth tightly, and do not touch it during use.  Throw out disposable facemasks and gloves after using them. Do not reuse.  Wash your hands immediately after removing your facemask and gloves.  If your personal clothing becomes contaminated, carefully remove clothing and launder. Wash your hands after handling contaminated clothing.  Place all  used disposable facemasks, gloves, and other waste in a lined container before disposing them with other household waste.  Remove gloves and wash your hands immediately after handling these items.  Do not share dishes, glasses, or other household items with the patient  Avoid sharing household items. You should not share dishes, drinking glasses, cups, eating utensils, towels, bedding, or other items with a patient who is confirmed to have, or being evaluated for, COVID-19 infection.  After the person uses these items, you should wash them thoroughly with soap and water.  Wash laundry thoroughly  Immediately remove and wash clothes or bedding that have blood, body fluids, and/or secretions or excretions, such as sweat, saliva, sputum, nasal mucus, vomit, urine, or feces, on  them.  Wear gloves when handling laundry from the patient.  Read and follow directions on labels of laundry or clothing items and detergent. In general, wash and dry with the warmest temperatures recommended on the label.  Clean all areas the individual has used often  Clean all touchable surfaces, such as counters, tabletops, doorknobs, bathroom fixtures, toilets, phones, keyboards, tablets, and bedside tables, every day. Also, clean any surfaces that may have blood, body fluids, and/or secretions or excretions on them.  Wear gloves when cleaning surfaces the patient has come in contact with.  Use a diluted bleach solution (e.g., dilute bleach with 1 part bleach and 10 parts water) or a household disinfectant with a label that says EPA-registered for coronaviruses. To make a bleach solution at home, add 1 tablespoon of bleach to 1 quart (4 cups) of water. For a larger supply, add  cup of bleach to 1 gallon (16 cups) of water.  Read labels of cleaning products and follow recommendations provided on product labels. Labels contain instructions for safe and effective use of the cleaning product including precautions you should take when applying the product, such as wearing gloves or eye protection and making sure you have good ventilation during use of the product.  Remove gloves and wash hands immediately after cleaning.  Monitor yourself for signs and symptoms of illness Caregivers and household members are considered close contacts, should monitor their health, and will be asked to limit movement outside of the home to the extent possible. Follow the monitoring steps for close contacts listed on the symptom monitoring form.   ? If you have additional questions, contact your local health department or call the epidemiologist on call at [phone] (available 24/7). ? This guidance is subject to change. For the most up-to-date guidance from [facility], please refer to their  website: YouBlogs.pl

## 2019-12-25 ENCOUNTER — Encounter: Payer: Self-pay | Admitting: Family Medicine

## 2019-12-25 ENCOUNTER — Telehealth (INDEPENDENT_AMBULATORY_CARE_PROVIDER_SITE_OTHER): Payer: PPO | Admitting: Family Medicine

## 2019-12-25 ENCOUNTER — Telehealth: Payer: Self-pay

## 2019-12-25 DIAGNOSIS — U071 COVID-19: Secondary | ICD-10-CM

## 2019-12-25 DIAGNOSIS — J9601 Acute respiratory failure with hypoxia: Secondary | ICD-10-CM

## 2019-12-25 DIAGNOSIS — J1282 Pneumonia due to coronavirus disease 2019: Secondary | ICD-10-CM | POA: Diagnosis not present

## 2019-12-25 LAB — CULTURE, BLOOD (ROUTINE X 2)
Culture: NO GROWTH
Special Requests: ADEQUATE

## 2019-12-25 NOTE — Telephone Encounter (Signed)
Pt has appointment today with pcp, will go over then.

## 2019-12-25 NOTE — Telephone Encounter (Signed)
Transition Care Management Follow-up Telephone Call  Date of discharge and from where: 12/24/19 from [facility]  How have you been since you were released from the hospital? "This morning was very scary, her sats were at 66%"  Any questions or concerns? Yes ; discussed signs to watch for and at what point to call 911. Daughter reported oxygen sat 66% on 5L O2 after using bathroom. Reports that it took 45 minutes and placing her on left side to improve breathing. She states that breathing was worse when she was sitting up. Daughter is very worried that her Mother was released from hospital too soon. Father passed away 1 week ago from covid and daughter also currently has covid.  Items Reviewed:  Did the pt receive and understand the discharge instructions provided? Yes   Medications obtained and verified? Yes  Any new allergies since your discharge? No   Dietary orders reviewed? Yes  Do you have support at home? Yes   Other (ie: DME, Home Health, etc) oxygen equipment  Functional Questionnaire: (I = Independent and D = Dependent) ADL's: Patient requires help from daughter with everything right now.  Bathing/Dressing-    Meal Prep-   Eating-   Maintaining continence-   Transferring/Ambulation-   Managing Meds-    Follow up appointments reviewed:    PCP Hospital f/u appt confirmed? Yes  Scheduled to see Dr. Martinique 12/24/19 2pm virtually  [facility] f/u appt confirmed? No  no specialist appointments suggested at time of discharge.  Are transportation arrangements needed? No   If their condition worsens, is the pt aware to call  their PCP or go to the ED? yes  Was the patient provided with contact information for the PCP's office or ED? Yes  Was the pt encouraged to call back with questions or concerns? Yes

## 2019-12-25 NOTE — Progress Notes (Signed)
Virtual Visit via Telephone Note  I connected with Erika Brown and Erika Brown on 12/25/19 at  2:00 PM EST by telephone and verified that I am speaking with the correct person using two identifiers.  We tried to do video visit but could not connect.  I discussed the limitations, risks, security and privacy concerns of performing an evaluation and management service by telephone and the availability of in person appointments. I also discussed with the patient that there may be a patient responsible charge related to this service. The patient expressed understanding and agreed to proceed.  Location patient: home Location provider: work office Participants present for the call: patient, provider Patient did not have a visit in the prior 7 days to address this/these issue(s).   History of Present Illness: Erika Brown is a 79 yo female with hx of hypertension, CAD, and hyperlipidemia who was recently hospitalized due to complications of [id number] pneumonia. Erika husband died last week due to complications of [id number] infection. Erika Brown is also recovering from COVID-19 infection. Dx'ed with COVID 19 infection on 12/17/19. She received belimumab infusion on 12/20/2019.  She presented to the ER with perioral cyanosis after coughing spell and found to have acute hypoxic respiratory failure secondary to COVID-19 pneumonia.  She was discharged on Combivent inhaler, which she has not used because according to the Brown, she was not instructed Erika to do so. O2 supplementation 2 to 4 L/min per [place]. She is monitoring pulse ox, this morning he was down to mid 60s-70s when she got up to go to the bathroom; improved after increasing O2 supplementation to 5 L/min. Currently she is using 2 L/min.  Pulse ox has been in the mid 80s, it seems to improve when lying on Erika left side. Bilateral lower extremity edema, no erythema and it is symmetric. Negative for orthopnea or PND.  As far as she is resting  she has no dyspnea. Negative for CP, wheezing, palpitations, or diaphoresis. She has good appetite. Having bowel movements and normal urination. No abdominal pain,N/V,or urinary symptom.  She is on amlodipine 5 mg daily, metoprolol succinate 50 mg daily, and it irbesartan-HCTZ 300-12.5 mg daily. She is not checking BP at home. CAD on Plavix 75 mg daily and Aspirin 81 mg daily.  Lab Results  Component Value Date   CREATININE 0.65 12/24/2019   BUN 26 (H) 12/24/2019   NA 136 12/24/2019   K 3.8 12/24/2019   CL 102 12/24/2019   CO2 25 12/24/2019   Lab Results  Component Value Date   WBC 9.3 12/24/2019   HGB 11.8 (L) 12/24/2019   HCT 35.6 (L) 12/24/2019   MCV 82.2 12/24/2019   PLT 314 12/24/2019     Observations/Objective: Patient sounds cheerful and well on the phone. I do not appreciate any SOB.Occasional episodes of non productive cough. Speech and thought processing are grossly intact. Patient reported vitals:LMP 11/29/1975   SpO2 94%   Sitting up pulse ox 88%, lying on Erika left side up to 94% with supplemental O2 3 LPM.   Assessment and Plan:  1. Acute hypoxemic respiratory failure ([place]) - Ambulatory referral to Home Health  2. Pneumonia due to COVID-19 virus - Ambulatory referral to [facility] O2 at  3 LPM per [place]. Rest in bed for now,on Erika left side. We discuss the risk of DVT with prolonged rest, recommend LE movement,elevation. Compression stocking may also help. Monitor for erythema or LE pain.  A few times per day  she can sit while monitoring pulse ox. Monitor for acute respiratory distress or pulse O2 while on left side < 89%, in which case she needs to call 911. HH will be arranged. Combivent Respimat 1 puff bid for 3 days and continue if it helps with respiratory symptoms.We discussed some side effects. Erika Brown voices understanding and agrees with treatment.  Follow Up Instructions:  Return in about 3 days (around  12/28/2019).   I did not refer this patient for an OV in the next 24 hours for this/these issue(s).  I discussed the assessment and treatment plan with the patient. Erika Brown and Erika Brown provided an opportunity to ask questions and all were answered. They agreed with the plan and demonstrated an understanding of the instructions.    I provided 34 minutes of non-face-to-face time during this encounter.   Clydine Parkison Martinique, MD

## 2019-12-26 ENCOUNTER — Telehealth: Payer: Self-pay | Admitting: Family Medicine

## 2019-12-26 NOTE — Telephone Encounter (Signed)
Spoke with Erika Brown and her daughter. Daughter was concerned because this morning her O2 sat was in the 70s, now she is doing better.  Erika Brown is reporting feeling better today that she did yesterday. No new associated symptom.  Sitting now with supplemental O2 3 L/min per  O2 sat is 84%. Instructed to increase supplemental oxygen to 5 LPM, O2 sat 89 to 90%. She walked around the room with supplemental O2 ,pulse ox was 89%. Sat again and pulse Ox down to 85% and back up to 91% in less than a min. During the night while she was lying on her left side O2 sat was 96%.  We discussed indications for going to the ER and hospitalization.  If daughter does not feel comfortable monitoring her at home, it is appropriate to take her to the ER if O2 is below 88 to 89%.  Because clinically she is improving, we need to be sure pulse ox is accurate.  Her daughter feels reassured, she feels like she can continue monitoring her at home. Continue with supplemental O2 3 to 5 L/min. O2 goal 88-89%. If O2 sat < 88% with 5 LPM of supplemental O2 and does do not go up to at least 88-89% in a couple min,she needs to go to the ER. If Erika changes, perioral cyanosis, worsening cough, wheezing, respiratory distress daughter must call 911. Pending [place] service. Daughter and Erika Brown voiced understanding and agree with plan.  Total phone call 13 min.  Erika Wery Martin[place], MD

## 2019-12-26 NOTE — Telephone Encounter (Signed)
Per pt daughter Erika Brown. Pt is having problems with her Oxygen levels while sitting still; her o2 level is 90. while moving it is 70. need to know what the Dr, advise pt daughter would like a call back at [phone]

## 2019-12-28 ENCOUNTER — Telehealth (INDEPENDENT_AMBULATORY_CARE_PROVIDER_SITE_OTHER): Payer: Medicare Other | Admitting: Family Medicine

## 2019-12-28 ENCOUNTER — Encounter: Payer: Self-pay | Admitting: Family Medicine

## 2019-12-28 VITALS — BP 124/75 | HR 107

## 2019-12-28 DIAGNOSIS — U071 COVID-19: Secondary | ICD-10-CM | POA: Diagnosis not present

## 2019-12-28 DIAGNOSIS — J1282 Pneumonia due to coronavirus disease 2019: Secondary | ICD-10-CM

## 2019-12-28 DIAGNOSIS — J9601 Acute respiratory failure with hypoxia: Secondary | ICD-10-CM | POA: Diagnosis not present

## 2019-12-28 DIAGNOSIS — I493 Ventricular premature depolarization: Secondary | ICD-10-CM

## 2019-12-28 NOTE — Progress Notes (Signed)
Virtual Visit via Telephone Note  I connected with Erika Brown and her daughter on 12/28/19 at  2:00 PM EST by telephone and verified that I am speaking with the correct person using two identifiers.   I discussed the limitations, risks, security and privacy concerns of performing an evaluation and management service by telephone and the availability of in person appointments. I also discussed with the patient that there may be a patient responsible charge related to this service. The patient expressed understanding and agreed to proceed.  Location patient: home Location provider: work office Participants present for the call: patient, provider Patient did not have a visit in the prior 7 days to address this/these issue(s).  History of Present Illness: Erika Brown is a 79 yo female recovering from [place] 29. Last virtual visit on 12/25/19. Slowly getting better, she is able to sit for a few min before O2 sat starts dropping. Most of the time on 3 LPM of supplemental O2. Still having good appetite. No changes in bowel habits or urinary symptoms.  In the morning hypoxia seem to be worse, 51%, when she first gets up and walks to the toilet, associated SOB. O2 pulse Ox rapidly goes up to 90% and above when lying in bed on her left side. She has had mildly elevated HR, low 100's. Hx of PVC's. She is on Metoprolol succinate 50 mg daily.  Negative for cyanosis, CP,palpitations,wheezing,cough,or syncope.  In the morning she is not able to eat and talk at the dame time, she can do so when eating lunch and dinner. At this time she is on 3 LPM of supplemental O2 per [place] while sitting down now, pulse ox at 90%.  Average of O2 sats in the afternoon and night in the mid 80%-89%. Up to 94% when lying on left side.  She had nasal congestion yesterday, blew a thick "clot mucus" and felt much better,  Negative for chills, fever, sore throat, palpitation, abdominal pain, diarrhea, urinary symptoms, or Erika  changes.  [place] planning on starting next week.  Observations/Objective: Patient sounds cheerful and well on the phone. I do not appreciate any SOB. Speech and thought processing are grossly intact. Patient reported vitals:BP 124/75   Pulse (!) 107   LMP 11/29/1975   SpO2 91%   Assessment and Plan: 1. Acute hypoxemic respiratory failure ([place]) Still having episodes of hypoxia but it is not getting worse. No new symptoms. I still thinks she can stay home for now unless hypoxic episodes get more frequent or recovering period > 1 min. I asked daughter if she is still feeling comfortable monitoring Erika Brown at home, she is more comfortable now and having a better understanding of disease and treatment.  2. PVC's (premature ventricular contractions) Mildly tachycardic. Continue Metoprolol Succinate 50 mg daily. Continue monitoring HR.  3. Pneumonia due to COVID-19 virus She is not longer having fever and most of respiratory symptoms have resolved. Still needs supplemental O2. Explained daughter that complete recovery can take time. We discussed possible complications, she understands when to call 911 or take her to the ER.  Follow Up Instructions:  Return in about 5 days (around 01/02/2020).  I did not refer this patient for an OV in the next 24 hours for this/these issue(s).  I discussed the assessment and treatment plan with the patient. Erika Brown and her daughter were provided an opportunity to ask questions and all were answered. They agreed with the plan and demonstrated an understanding of the instructions.  I provided 15 minutes of non-face-to-face time during this encounter.   Dace Denn [place], MD

## 2020-01-01 DIAGNOSIS — Z9181 History of falling: Secondary | ICD-10-CM | POA: Diagnosis not present

## 2020-01-01 DIAGNOSIS — Z7982 Long term (current) use of aspirin: Secondary | ICD-10-CM | POA: Diagnosis not present

## 2020-01-01 DIAGNOSIS — Z85828 Personal history of other malignant neoplasm of skin: Secondary | ICD-10-CM | POA: Diagnosis not present

## 2020-01-01 DIAGNOSIS — J9691 Respiratory failure, unspecified with hypoxia: Secondary | ICD-10-CM | POA: Diagnosis not present

## 2020-01-01 DIAGNOSIS — I2510 Atherosclerotic heart disease of native coronary artery without angina pectoris: Secondary | ICD-10-CM | POA: Diagnosis not present

## 2020-01-01 DIAGNOSIS — F329 Major depressive disorder, single episode, unspecified: Secondary | ICD-10-CM | POA: Diagnosis not present

## 2020-01-01 DIAGNOSIS — Z955 Presence of coronary angioplasty implant and graft: Secondary | ICD-10-CM | POA: Diagnosis not present

## 2020-01-01 DIAGNOSIS — J1282 Pneumonia due to coronavirus disease 2019: Secondary | ICD-10-CM | POA: Diagnosis not present

## 2020-01-01 DIAGNOSIS — L409 Psoriasis, unspecified: Secondary | ICD-10-CM | POA: Diagnosis not present

## 2020-01-01 DIAGNOSIS — E559 Vitamin D deficiency, unspecified: Secondary | ICD-10-CM | POA: Diagnosis not present

## 2020-01-01 DIAGNOSIS — Z87891 Personal history of nicotine dependence: Secondary | ICD-10-CM | POA: Diagnosis not present

## 2020-01-01 DIAGNOSIS — I252 Old myocardial infarction: Secondary | ICD-10-CM | POA: Diagnosis not present

## 2020-01-01 DIAGNOSIS — I10 Essential (primary) hypertension: Secondary | ICD-10-CM | POA: Diagnosis not present

## 2020-01-01 DIAGNOSIS — F419 Anxiety disorder, unspecified: Secondary | ICD-10-CM | POA: Diagnosis not present

## 2020-01-01 DIAGNOSIS — E785 Hyperlipidemia, unspecified: Secondary | ICD-10-CM | POA: Diagnosis not present

## 2020-01-01 DIAGNOSIS — U071 COVID-19: Secondary | ICD-10-CM | POA: Diagnosis not present

## 2020-01-01 DIAGNOSIS — M25512 Pain in left shoulder: Secondary | ICD-10-CM | POA: Diagnosis not present

## 2020-01-01 DIAGNOSIS — Z9981 Dependence on supplemental oxygen: Secondary | ICD-10-CM | POA: Diagnosis not present

## 2020-01-01 DIAGNOSIS — Z7902 Long term (current) use of antithrombotics/antiplatelets: Secondary | ICD-10-CM | POA: Diagnosis not present

## 2020-01-03 ENCOUNTER — Telehealth: Payer: Self-pay | Admitting: Family Medicine

## 2020-01-03 ENCOUNTER — Other Ambulatory Visit: Payer: Self-pay | Admitting: Family Medicine

## 2020-01-03 NOTE — Telephone Encounter (Signed)
Pt daughter is requesting her ergocalciferol (vitamin D2) be refilled. [employer] [facility]. Pt has ran out of medication and needs it as soon as possible.

## 2020-01-03 NOTE — Telephone Encounter (Signed)
Last OV 12/28/19 Last refill Next OV 03/07/20

## 2020-01-04 ENCOUNTER — Other Ambulatory Visit: Payer: Self-pay | Admitting: Family Medicine

## 2020-01-04 DIAGNOSIS — J9601 Acute respiratory failure with hypoxia: Secondary | ICD-10-CM

## 2020-01-04 DIAGNOSIS — U071 COVID-19: Secondary | ICD-10-CM

## 2020-01-04 MED ORDER — VITAMIN D2 50 MCG (2000 UT) PO TABS: ORAL_TABLET | ORAL | 3 refills | Status: DC

## 2020-01-04 NOTE — Telephone Encounter (Signed)
Rx sent in

## 2020-01-06 ENCOUNTER — Encounter: Payer: Self-pay | Admitting: Family Medicine

## 2020-01-06 DIAGNOSIS — I10 Essential (primary) hypertension: Secondary | ICD-10-CM | POA: Diagnosis not present

## 2020-01-06 DIAGNOSIS — L409 Psoriasis, unspecified: Secondary | ICD-10-CM | POA: Diagnosis not present

## 2020-01-06 DIAGNOSIS — Z955 Presence of coronary angioplasty implant and graft: Secondary | ICD-10-CM | POA: Diagnosis not present

## 2020-01-06 DIAGNOSIS — I2510 Atherosclerotic heart disease of native coronary artery without angina pectoris: Secondary | ICD-10-CM | POA: Diagnosis not present

## 2020-01-06 DIAGNOSIS — U071 COVID-19: Secondary | ICD-10-CM | POA: Diagnosis not present

## 2020-01-06 DIAGNOSIS — Z7982 Long term (current) use of aspirin: Secondary | ICD-10-CM | POA: Diagnosis not present

## 2020-01-06 DIAGNOSIS — E785 Hyperlipidemia, unspecified: Secondary | ICD-10-CM | POA: Diagnosis not present

## 2020-01-06 DIAGNOSIS — F329 Major depressive disorder, single episode, unspecified: Secondary | ICD-10-CM | POA: Diagnosis not present

## 2020-01-06 DIAGNOSIS — M25512 Pain in left shoulder: Secondary | ICD-10-CM | POA: Diagnosis not present

## 2020-01-06 DIAGNOSIS — Z9981 Dependence on supplemental oxygen: Secondary | ICD-10-CM | POA: Diagnosis not present

## 2020-01-06 DIAGNOSIS — J9691 Respiratory failure, unspecified with hypoxia: Secondary | ICD-10-CM | POA: Diagnosis not present

## 2020-01-06 DIAGNOSIS — E559 Vitamin D deficiency, unspecified: Secondary | ICD-10-CM | POA: Diagnosis not present

## 2020-01-06 DIAGNOSIS — Z85828 Personal history of other malignant neoplasm of skin: Secondary | ICD-10-CM | POA: Diagnosis not present

## 2020-01-06 DIAGNOSIS — I252 Old myocardial infarction: Secondary | ICD-10-CM | POA: Diagnosis not present

## 2020-01-06 DIAGNOSIS — Z87891 Personal history of nicotine dependence: Secondary | ICD-10-CM | POA: Diagnosis not present

## 2020-01-06 DIAGNOSIS — F419 Anxiety disorder, unspecified: Secondary | ICD-10-CM | POA: Diagnosis not present

## 2020-01-06 DIAGNOSIS — Z9181 History of falling: Secondary | ICD-10-CM | POA: Diagnosis not present

## 2020-01-06 DIAGNOSIS — J1282 Pneumonia due to coronavirus disease 2019: Secondary | ICD-10-CM | POA: Diagnosis not present

## 2020-01-06 DIAGNOSIS — Z7902 Long term (current) use of antithrombotics/antiplatelets: Secondary | ICD-10-CM | POA: Diagnosis not present

## 2020-01-07 ENCOUNTER — Telehealth: Payer: Self-pay | Admitting: Interventional Cardiology

## 2020-01-07 ENCOUNTER — Encounter: Payer: Self-pay | Admitting: Family Medicine

## 2020-01-07 ENCOUNTER — Telehealth (INDEPENDENT_AMBULATORY_CARE_PROVIDER_SITE_OTHER): Payer: PPO | Admitting: Family Medicine

## 2020-01-07 VITALS — BP 125/76 | HR 113 | Ht 64.0 in

## 2020-01-07 DIAGNOSIS — E785 Hyperlipidemia, unspecified: Secondary | ICD-10-CM | POA: Diagnosis not present

## 2020-01-07 DIAGNOSIS — F419 Anxiety disorder, unspecified: Secondary | ICD-10-CM | POA: Diagnosis not present

## 2020-01-07 DIAGNOSIS — E559 Vitamin D deficiency, unspecified: Secondary | ICD-10-CM | POA: Diagnosis not present

## 2020-01-07 DIAGNOSIS — J9601 Acute respiratory failure with hypoxia: Secondary | ICD-10-CM

## 2020-01-07 DIAGNOSIS — J1282 Pneumonia due to coronavirus disease 2019: Secondary | ICD-10-CM | POA: Diagnosis not present

## 2020-01-07 DIAGNOSIS — I10 Essential (primary) hypertension: Secondary | ICD-10-CM

## 2020-01-07 DIAGNOSIS — I2510 Atherosclerotic heart disease of native coronary artery without angina pectoris: Secondary | ICD-10-CM

## 2020-01-07 DIAGNOSIS — U071 COVID-19: Secondary | ICD-10-CM | POA: Diagnosis not present

## 2020-01-07 DIAGNOSIS — Z9981 Dependence on supplemental oxygen: Secondary | ICD-10-CM | POA: Diagnosis not present

## 2020-01-07 DIAGNOSIS — Z955 Presence of coronary angioplasty implant and graft: Secondary | ICD-10-CM | POA: Diagnosis not present

## 2020-01-07 DIAGNOSIS — R079 Chest pain, unspecified: Secondary | ICD-10-CM | POA: Diagnosis not present

## 2020-01-07 DIAGNOSIS — Z7982 Long term (current) use of aspirin: Secondary | ICD-10-CM | POA: Diagnosis not present

## 2020-01-07 DIAGNOSIS — R040 Epistaxis: Secondary | ICD-10-CM

## 2020-01-07 DIAGNOSIS — Z9181 History of falling: Secondary | ICD-10-CM | POA: Diagnosis not present

## 2020-01-07 DIAGNOSIS — Z85828 Personal history of other malignant neoplasm of skin: Secondary | ICD-10-CM | POA: Diagnosis not present

## 2020-01-07 DIAGNOSIS — Z87891 Personal history of nicotine dependence: Secondary | ICD-10-CM | POA: Diagnosis not present

## 2020-01-07 DIAGNOSIS — I252 Old myocardial infarction: Secondary | ICD-10-CM | POA: Diagnosis not present

## 2020-01-07 DIAGNOSIS — J9691 Respiratory failure, unspecified with hypoxia: Secondary | ICD-10-CM | POA: Diagnosis not present

## 2020-01-07 DIAGNOSIS — R000 Tachycardia, unspecified: Secondary | ICD-10-CM | POA: Diagnosis not present

## 2020-01-07 DIAGNOSIS — Z7902 Long term (current) use of antithrombotics/antiplatelets: Secondary | ICD-10-CM | POA: Diagnosis not present

## 2020-01-07 DIAGNOSIS — M25512 Pain in left shoulder: Secondary | ICD-10-CM | POA: Diagnosis not present

## 2020-01-07 DIAGNOSIS — F329 Major depressive disorder, single episode, unspecified: Secondary | ICD-10-CM | POA: Diagnosis not present

## 2020-01-07 DIAGNOSIS — L409 Psoriasis, unspecified: Secondary | ICD-10-CM | POA: Diagnosis not present

## 2020-01-07 MED ORDER — VITAMIN D (ERGOCALCIFEROL) 1.25 MG (50000 UNIT) PO CAPS: 50000.0000 [IU] | ORAL_CAPSULE | ORAL | 1 refills | Status: DC

## 2020-01-07 NOTE — Progress Notes (Signed)
Virtual Visit via Video Note   I connected with Erika Brown on 01/07/20 by a video enabled telemedicine application and verified that I am speaking with the correct person using two identifiers.  Location patient: home Location provider:work office Persons participating in the virtual visit: patient, provider  I discussed the limitations of evaluation and management by telemedicine and the availability of in person appointments. The patient expressed understanding and agreed to proceed.   HPI: Erika Brown is a 79 yo female following on some of her medical problems. She was last seen on 12/28/19. No new problems sine her last visit. Hypoxemic respiratory failure s/p COVID 19 pneumonia. She is on supplemental O2, 2 LPM with exertion, instead 5 LPM. At rest O2 supplementation down from 3 LMP to 1 LPM. O2 sat during the day, seated, 95%. O2 sats are better when she is lying on her left side.  She has some sternal low CP with deep inspiration, when doing incentive spirometry, and sometimes with palpation. She is still coughing, negative for hemoptysis.  She is concerned because HR has been elevated, most of the time in the low 100s, 113/minute.  In one occasion was 130/minute for a second. No associated CP, diaphoresis, dyspnea, or dizziness. History of PVCs. Currently she is on metoprolol succinate 50 mg daily. Hypertension: BP 120s/60s. She is also on amlodipine 5 mg daily and it irbesartan-HCTZ 300-12.5 mg daily. No unusual headache,visual changes,focal neurologist deficit,or edema.  She has not noted fever, chills, sore throat, abdominal pain, N/V, changes in bowel habits, or urinary symptoms.  Lab Results  Component Value Date   CREATININE 0.65 12/24/2019   BUN 26 (H) 12/24/2019   NA 136 12/24/2019   K 3.8 12/24/2019   CL 102 12/24/2019   CO2 25 12/24/2019   For the past few days she has had mild blood in nasal mucus. Pinkish mixed with clear mucus and occasional small "blood  clots." She is on Plavix 75 mg daily and Aspirin 81 mg daily. She has not noted gum bleeding, blood in stool,melena,or gross hematuria. No more bruising than usual.  D deficiency: Last 25 OH vit D was 52 in 11/07/19. She has been on Ergocalciferol 50,000 U 2 times per week.  ROS: See pertinent positives and negatives per HPI.  Past Medical History:  Diagnosis Date  . Anxiety and depression   . CAD (coronary artery disease)    S/P NSTEMI 4/06 EF 60%; Treated with [place] DES to LAD and RCA with kissing balloon PCI diagonal; Treadmill myoview 10/09 for CP: Walked 7:06. mild ST-T-wave changes in recovery. EF 71%. Very mild reversible defect in base of the inferior wall --> medical rx.  . Cancer ([place])    HISTORY OF SKIN CANCER  . Cataract   . Complication of anesthesia    " DIFFICULT TO WAKE UP "  . Depression   . Glucose intolerance (impaired glucose tolerance)   . History of echocardiogram    Echo 11/17: Vigorous LVF, EF 65-70, normal wall motion, borderline diastolic function, systolic bowing of mitral valve without prolapse, mild MR, trivial TR, PASP 20  . History of medication noncompliance   . Hyperlipidemia    Intolerate to multiple statins due to severe myalgias  . Hypertension    Possible white-coat component  . MI (myocardial infarction) ([place])    acute-s/p stent  . Microhematuria   . Psoriasis   . Rapid heart beat   . Vitamin D deficiency     Past Surgical History:  Procedure Laterality Date  . ABDOMINAL HYSTERECTOMY     and anterior repair  . APPENDECTOMY    . bladder tact    . BREAST BIOPSY    . CARDIAC CATHETERIZATION  02/28/2018  . CATARACT EXTRACTION Bilateral 2017   Dr. Katy Fitch  . CORONARY STENT INTERVENTION N/A 02/28/2018   Procedure: CORONARY STENT INTERVENTION;  Surgeon: Jettie Booze, MD;  Location: [facility];  Service: Cardiovascular;  Laterality: N/A;  . CORONARY STENT PLACEMENT    . CORONARY STENT PLACEMENT  04/02/019  . LEFT HEART CATH  AND CORONARY ANGIOGRAPHY N/A 02/28/2018   Procedure: LEFT HEART CATH AND CORONARY ANGIOGRAPHY;  Surgeon: Jettie Booze, MD;  Location: [facility];  Service: Cardiovascular;  Laterality: N/A;  . MOHS SURGERY  9/14   [facility].  [facility]  . TONSILLECTOMY AND ADENOIDECTOMY    . ULTRASOUND GUIDANCE FOR VASCULAR ACCESS  02/28/2018   Procedure: Ultrasound Guidance For Vascular Access;  Surgeon: Jettie Booze, MD;  Location: [facility];  Service: Cardiovascular;;    Family History  Problem Relation Age of Onset  . Heart disease Father   . Heart attack Father   . Ulcers Father   . Hypertension Mother   . Melanoma Mother   . Cancer Daughter        melanoma  . Breast cancer Daughter   . Stroke Maternal Grandmother   . Hypertension Maternal Grandmother   . Stroke Maternal Grandfather   . Renal Disease Maternal Grandfather        kidney removed  . Hypertension Maternal Grandfather   . Diabetes Paternal Grandmother   . Melanoma Child        Metastatic  . Breast cancer Sister 48  . Hypertension Brother   . Asthma Brother   . Depression Daughter        x2  . Rectal cancer Son   . Coronary artery disease Neg Hx     Social History   Socioeconomic History  . Marital status: Married    Spouse name: Not on file  . Number of children: 7  . Years of education: Not on file  . Highest education level: Not on file  Occupational History  . Occupation: Works at home, Charity fundraiser, [place] Use  . Smoking status: Former Research scientist (life sciences)  . Smokeless tobacco: Never Used  Substance and Sexual Activity  . Alcohol use: Yes    Alcohol/week: 4.0 standard drinks    Types: 4 Glasses of wine per week  . Drug use: No  . Sexual activity: Not Currently    Birth control/protection: Abstinence, Surgical    Comment: [facility]  Other Topics Concern  . Not on file  Social History Narrative   Lives in [place] with her husband   Seven kids   Plays tennis   Very good card  player   Social Determinants of Health   Financial Resource Strain:   . Difficulty of Paying Living Expenses: Not on file  Food Insecurity:   . Worried About Charity fundraiser in the Last Year: Not on file  . Ran Out of Food in the Last Year: Not on file  Transportation Needs:   . Lack of Transportation (Medical): Not on file  . Lack of Transportation (Non-Medical): Not on file  Physical Activity:   . Days of Exercise per Week: Not on file  . Minutes of Exercise per Session: Not on file  Stress:   . Feeling of  Stress : Not on file  Social Connections:   . Frequency of Communication with Friends and Family: Not on file  . Frequency of Social Gatherings with Friends and Family: Not on file  . Attends Religious Services: Not on file  . Active Member of Clubs or Organizations: Not on file  . Attends Archivist Meetings: Not on file  . Marital Status: Not on file  Intimate Partner Violence:   . Fear of Current or Ex-Partner: Not on file  . Emotionally Abused: Not on file  . Physically Abused: Not on file  . Sexually Abused: Not on file    Current Outpatient Medications:  .  albuterol (VENTOLIN HFA) 108 (90 Base) MCG/ACT inhaler, Inhale 2 puffs into the lungs every 6 (six) hours as needed for wheezing or shortness of breath., Disp: 6.7 g, Rfl: 0 .  amLODipine (NORVASC) 5 MG tablet, TAKE 1 TABLET ONCE DAILY., Disp: 90 tablet, Rfl: 0 .  aspirin 81 MG EC tablet, Take 81 mg by mouth daily.  , Disp: , Rfl:  .  benzonatate (TESSALON) 200 MG capsule, Take 1 capsule (200 mg total) by mouth 3 (three) times daily as needed for cough., Disp: 20 capsule, Rfl: 0 .  chlorpheniramine-HYDROcodone (TUSSIONEX) 10-8 MG/5ML SUER, Take 5 mLs by mouth every 12 (twelve) hours as needed for cough., Disp: 140 mL, Rfl: 0 .  clopidogrel (PLAVIX) 75 MG tablet, Take 1 tablet (75 mg total) by mouth daily. Please keep upcoming appt with Dr. Irish Lack in January before anymore refills. Thank you, Disp: 90  tablet, Rfl: 0 .  ezetimibe (ZETIA) 10 MG tablet, Take 1 tablet (10 mg total) by mouth daily. Please make yearly appt with Dr. Irish Lack for December before anymore refills. 1st attempt, Disp: 90 tablet, Rfl: 0 .  irbesartan-hydrochlorothiazide (AVALIDE) 300-12.5 MG tablet, TAKE 1 TABLET ONCE DAILY. (Patient taking differently: Take 1 tablet by mouth daily. ), Disp: 90 tablet, Rfl: 3 .  meclizine (ANTIVERT) 25 MG tablet, Take 12.5 mg by mouth 3 (three) times daily as needed for dizziness. , Disp: , Rfl:  .  metoprolol succinate (TOPROL-XL) 50 MG 24 hr tablet, Take 1 tablet (50 mg total) by mouth daily., Disp: 90 tablet, Rfl: 3 .  nitroGLYCERIN (NITROSTAT) 0.4 MG SL tablet, Place 1 tablet (0.4 mg total) under the tongue every 5 (five) minutes as needed for chest pain., Disp: 25 tablet, Rfl: 0 .  potassium chloride SA (KLOR-CON) 20 MEQ tablet, Take 1 tablet (20 mEq total) by mouth daily. Please keep upcoming appt in January with Dr. Irish Lack before anymore refills. Thank you, Disp: 30 tablet, Rfl: 0 .  predniSONE (DELTASONE) 10 MG tablet, Take 40 mg daily for 1 day, 30 mg daily for 1 day, 20 mg daily for 1 days,10 mg daily for 1 day, then stop, Disp: 10 tablet, Rfl: 0 .  Vitamin D, Ergocalciferol, (DRISDOL) 1.25 MG (50000 UNIT) CAPS capsule, Take 1 capsule (50,000 Units total) by mouth every 7 (seven) days., Disp: 12 capsule, Rfl: 1  EXAM:  VITALS per patient if applicable:BP [id number]   Pulse (!) 113   Ht 5\' 4"  (1.626 m)   LMP 11/29/1974   SpO2 90% Comment: RA  BMI 24.03 kg/m   GENERAL: alert, oriented, appears well and in no acute distress  HEENT: atraumatic, conjunctiva clear, no obvious abnormalities on inspection.  NECK: normal movements of the head and neck  LUNGS: on inspection no signs of respiratory distress, breathing rate appears normal, no obvious gross SOB,  gasping or wheezing  CV: no obvious cyanosis  Erika: moves all visible extremities without noticeable  abnormality  PSYCH/NEURO: pleasant and cooperative, no obvious depression or anxiety, speech and thought processing grossly intact  ASSESSMENT AND PLAN:  Discussed the following assessment and plan:  Acute hypoxemic respiratory failure (H[facility]) Progressively getting better. During visit she was at RA and O2 sat  From 94-95% to 90% Continue overnight O2 supplementation 2 L/min on throughout the day 1 L/min.  Instructed to check O2 sat at room air a few times throughout the day.    Essential hypertension BP adequately controlled. Continue monitoring BP given the fact metoprolol succinate was increased from 50 mg to 75 mg daily. No changes in it irbesartan-HCTZ or amlodipine. Continue low salt diet.  Vitamin D deficiency, unspecified Resume ergocalciferol 50,000 units and take it once per week. We will recheck 25 OH vitamin D in 3 months. We discussed some side effects of vitamin D supplementation.  Tachycardia Metoprolol succinate increased from 50 mg to 75 mg.  She has metoprolol succinate 25 mg left from her husband prescription, so she can take a 50 and a 25 mg daily. We discussed some side effects of beta-blockers. Continue monitoring HR and BP. Keep appointment with her cardiologist.  Epistaxis Could be related with dry nasal mucosa. Plavix and Aspirin can aggravate problem. Monitor for worsening nosebleed other bleeding from other sites.   Nonspecific chest pain History suggest musculoskeletal chest pain. Avoid shallow breathing, continue incentive spirometry. Keep appointment with cardiologist.  I discussed the assessment and treatment plan with the patient. Erika Bohls was provided an opportunity to ask questions and all were answered.She agreed with the plan and demonstrated an understanding of the instructions.    Return in about 4 weeks (around 02/04/2020) for HTN, tachycardia..    Cola Highfill Martin[place], MD

## 2020-01-07 NOTE — Telephone Encounter (Signed)
Returned call call to patient. She states that she was diagnosed with COVID on 1/17 and was hospitalized. She states that  Her husband passed of COVID on 1/15. Patient has an appointment with Dr. Irish Lack on 2/11. Even though she is past the 21 days, her doctor has recommended that she quarantine longer. I have changed her appt to Virtual.   Patient is still on O2. She has been having some tachycardia in the low 100s at rest, BPs 120s/70s. Denies using inhalers. Made patient aware that HR may be elevated d/t infection. Instructed patient to stay hydrated. Patient also states that she has been having some intermittent episodes dry blood clots from her nose. Made her aware that this could be from wearing O2. Instructed patient to try some nasal spray. Patient is on ASA and plavix. Instructed for pateint to let us know if her Sx change or worsen. She verbalized understanding and thanked me for the call.

## 2020-01-07 NOTE — Telephone Encounter (Signed)
New message:    Patient has a appt that was cancel she has covid 19 and would like for some to call concering BP. Please call patient back.

## 2020-01-07 NOTE — Telephone Encounter (Signed)
See telephone encounter form today.

## 2020-01-09 NOTE — Progress Notes (Signed)
Virtual Visit via Video Note   This visit type was conducted due to national recommendations for restrictions regarding the COVID-19 Pandemic (e.g. social distancing) in an effort to limit this patient's exposure and mitigate transmission in our community.  Due to her co-morbid illnesses, this patient is at least at moderate risk for complications without adequate follow up.  This format is felt to be most appropriate for this patient at this time.  All issues noted in this document were discussed and addressed.  A limited physical exam was performed with this format.  Please refer to the patient's chart for her consent to telehealth for [facility].   Date:  01/10/2020   ID:  Erika Brown, DOB [birth year], MRN [id number]  Patient Location: Home Provider Location: Office  PCP:  Martinique, Betty G, MD  Cardiologist:  Larae Grooms, MD  Electrophysiologist:  None   Evaluation Performed:  Follow-Up Visit  Chief Complaint: CAD  History of Present Illness:    Erika Brown is a 79 y.o. female with CAD who is  aformer patient of Dr. Saunders Revel with coronary artery disease status post Cypher DES to the LAD and Cypher DES to the RCA as well as balloon angioplasty of the diagonal in 2006 in the setting of non-ST elevation myocardial infarction, hypertension, hyperlipidemia. Most recently, she underwent cardiac catheterization inApril 2019 after an abnormal stress test. This demonstrated in-stent restenosis in the LAD as well as severe stenosis in the LCx and RCA. She underwent multivessel stenting with a DES to the proximal-mid LAD, DES to the mid LCx and DES to the proximal-mid RCA. Last seen by Dr. Irving Burton July 2019. She complained of some palpitations and her beta-blocker dose was adjusted. They also discussed +/- ezetimibe therapy. She has intolerance to multiple statins and is not inclined to try PCSK-9 inhibitor therapy.  She had three vessel stenting in April 2019.    SHe was  trying to eat healthier.  She was decreasing meat intake.  She started Zetia in 10/19.      Since the last visit, her husband died from [place].  He was diagnosed with COVID in early Jan.  She got COVID from him at a nursing home. She was hospitalized for 4 days. She is feeling better now.  She received steroids, remdesivir.   She feels that she is having nosebleeds.  HR seems to be higher in general. Even now, while she is talking, HR is 103. Using oxygen at night, 2L.  Denies : Chest pain. Dizziness. Leg edema. Nitroglycerin use. Orthopnea. Palpitations. Paroxysmal nocturnal dyspnea. Syncope.   The patient does not have symptoms concerning for COVID-19 infection (fever, chills, cough, or new shortness of breath).    Past Medical History:  Diagnosis Date  . Anxiety and depression   . CAD (coronary artery disease)    S/P NSTEMI 4/06 EF 60%; Treated with Cypher DES to LAD and RCA with kissing balloon PCI diagonal; Treadmill myoview 10/09 for CP: Walked 7:06. mild ST-T-wave changes in recovery. EF 71%. Very mild reversible defect in base of the inferior wall --> medical rx.  . Cancer ([place])    HISTORY OF SKIN CANCER  . Cataract   . Complication of anesthesia    " DIFFICULT TO WAKE UP "  . Depression   . Glucose intolerance (impaired glucose tolerance)   . History of echocardiogram    Echo 11/17: Vigorous LVF, EF 65-70, normal wall motion, borderline diastolic function, systolic bowing of mitral valve without prolapse,  mild MR, trivial TR, PASP 20  . History of medication noncompliance   . Hyperlipidemia    Intolerate to multiple statins due to severe myalgias  . Hypertension    Possible white-coat component  . MI (myocardial infarction) ([place])    acute-s/p stent  . Microhematuria   . Psoriasis   . Rapid heart beat   . Vitamin D deficiency    Past Surgical History:  Procedure Laterality Date  . ABDOMINAL HYSTERECTOMY     and anterior repair  . APPENDECTOMY    . bladder tact    .  BREAST BIOPSY    . CARDIAC CATHETERIZATION  02/28/2018  . CATARACT EXTRACTION Bilateral 2017   Dr. Katy Fitch  . CORONARY STENT INTERVENTION N/A 02/28/2018   Procedure: CORONARY STENT INTERVENTION;  Surgeon: Jettie Booze, MD;  Location: [place] [facility];  Service: Cardiovascular;  Laterality: N/A;  . CORONARY STENT PLACEMENT    . CORONARY STENT PLACEMENT  04/02/019  . LEFT HEART CATH AND CORONARY ANGIOGRAPHY N/A 02/28/2018   Procedure: LEFT HEART CATH AND CORONARY ANGIOGRAPHY;  Surgeon: Jettie Booze, MD;  Location: [place] [facility];  Service: Cardiovascular;  Laterality: N/A;  . MOHS SURGERY  9/14   BCC.  [facility]  . TONSILLECTOMY AND ADENOIDECTOMY    . ULTRASOUND GUIDANCE FOR VASCULAR ACCESS  02/28/2018   Procedure: Ultrasound Guidance For Vascular Access;  Surgeon: Jettie Booze, MD;  Location: [place] [facility];  Service: Cardiovascular;;     Current Meds  Medication Sig  . albuterol (VENTOLIN HFA) 108 (90 Base) MCG/ACT inhaler Inhale 2 puffs into the lungs every 6 (six) hours as needed for wheezing or shortness of breath.  [name] amLODipine (NORVASC) 5 MG tablet TAKE 1 TABLET ONCE DAILY.  [name] aspirin 81 MG EC tablet Take 81 mg by mouth daily.    . benzonatate (TESSALON) 200 MG capsule Take 1 capsule (200 mg total) by mouth 3 (three) times daily as needed for cough.  . chlorpheniramine-HYDROcodone (TUSSIONEX) 10-8 MG/5ML SUER Take 5 mLs by mouth every 12 (twelve) hours as needed for cough.  . clopidogrel (PLAVIX) 75 MG tablet Take 1 tablet (75 mg total) by mouth daily. Please keep upcoming appt with Dr. Irish Lack in January before anymore refills. Thank you  . ezetimibe (ZETIA) 10 MG tablet Take 1 tablet (10 mg total) by mouth daily. Please make yearly appt with Dr. Irish Lack for December before anymore refills. 1st attempt  . irbesartan-hydrochlorothiazide (AVALIDE) 300-12.5 MG tablet TAKE 1 TABLET ONCE DAILY.  . meclizine (ANTIVERT) 25 MG tablet Take 12.5 mg by mouth 3  (three) times daily as needed for dizziness.   . metoprolol succinate (TOPROL-XL) 50 MG 24 hr tablet Take 1 tablet (50 mg total) by mouth daily. (Patient taking differently: Take 75 mg by mouth daily. pcp suggested taking 1.5 tabs (75 mg) by mouth daily.)  . nitroGLYCERIN (NITROSTAT) 0.4 MG SL tablet Place 1 tablet (0.4 mg total) under the tongue every 5 (five) minutes as needed for chest pain.  . potassium chloride SA (KLOR-CON) 20 MEQ tablet Take 1 tablet (20 mEq total) by mouth daily. Please keep upcoming appt in January with Dr. Irish Lack before anymore refills. Thank you  . predniSONE (DELTASONE) 10 MG tablet Take 40 mg daily for 1 day, 30 mg daily for 1 day, 20 mg daily for 1 days,10 mg daily for 1 day, then stop  . Vitamin D, Ergocalciferol, (DRISDOL) 1.25 MG (50000 UNIT) CAPS capsule Take 1 capsule (50,000  Units total) by mouth every 7 (seven) days.  . Zinc Sulfate (ZINC 15 PO) Take by mouth.     Allergies:   Iodine, Latex, Other, and Statins   Social History   Tobacco Use  . Smoking status: Former Research scientist (life sciences)  . Smokeless tobacco: Never Used  Substance Use Topics  . Alcohol use: Yes    Alcohol/week: 4.0 standard drinks    Types: 4 Glasses of wine per week  . Drug use: No     Family Hx: The patient's family history includes Asthma in her brother; Breast cancer in her daughter; Breast cancer (age of onset: 8) in her sister; Cancer in her daughter; Depression in her daughter; Diabetes in her paternal grandmother; Heart attack in her father; Heart disease in her father; Hypertension in her brother, maternal grandfather, maternal grandmother, and mother; Melanoma in her child and mother; Rectal cancer in her son; Renal Disease in her maternal grandfather; Stroke in her maternal grandfather and maternal grandmother; Ulcers in her father. There is no history of Coronary artery disease.  ROS:   Please see the history of present illness.     All other systems reviewed and are  negative.   Prior CV studies:   The following studies were reviewed today:  10/2019 echo reviewed  Labs/Other Tests and Data Reviewed:    EKG:  An ECG dated 12/20/19 was personally reviewed today and demonstrated:  sinus tach, otherwise normal  Recent Labs: 12/21/2019: B Natriuretic Peptide 89.2 12/24/2019: ALT 52; BUN 26; Creatinine, Ser 0.65; Hemoglobin 11.8; Platelets 314; Potassium 3.8; Sodium 136   Recent Lipid Panel Lab Results  Component Value Date/Time   CHOL 207 (H) 01/26/2019 09:21 AM   TRIG 75 12/20/2019 03:45 PM   HDL 87 01/26/2019 09:21 AM   CHOLHDL 2.4 01/26/2019 09:21 AM   CHOLHDL 3.0 03/02/2016 08:59 AM   LDLCALC 108 (H) 01/26/2019 09:21 AM   LDLDIRECT 121.7 09/08/2007 08:52 AM    Wt Readings from Last 3 Encounters:  01/10/20 136 lb (61.7 kg)  12/20/19 140 lb (63.5 kg)  11/07/19 146 lb (66.2 kg)     Objective:    Vital Signs:  BP 134/75   Pulse 75   Wt 136 lb (61.7 kg)   LMP 11/29/1974   BMI 23.34 kg/m    VITAL SIGNS:  reviewed GEN:  no acute distress RESPIRATORY:  normal respiratory effort, symmetric expansion NEURO:  alert and oriented x 3, no obvious focal deficit PSYCH:  normal affect exam limited by video format  ASSESSMENT & PLAN:    1. CAD: Continue aggressive secondary prevention.  No angina. Given blood in nasal discharge, I asked her use a humidifier in the house.  She is not having significant blood loss.  Given her multivessel stenting, will continue dual antiplatelet therapy.  2. Hypertension: The current medical regimen is effective;  continue present plan and medications. Readings controlled on the 75 mg of Toprol XL. If HR drops to < 60, would go back to Toprol XL 50 mg daily. 3. Hyperlipidemia: LDL was 108 in February 2020.  Continue healthy diet.  Whole food, plant-based diet recommended.  She has lost weight since her Covid infection.  She is still on a more prolonged quarantine.  This can be checked at a later time.   Continue  Zetia.  I suspect with her illness and weight loss, her LDL will be lower. 4. Mitral regurgitation: No CHF sx.  Tr MR in 10/2019.  Normal LV function.   COVID-19  Education: The signs and symptoms of COVID-19 were discussed with the patient and how to seek care for testing (follow up with PCP or arrange E-visit).  The importance of social distancing was discussed today.  Time:   Today, I have spent 22 minutes with the patient with telehealth technology discussing the above problems.     Medication Adjustments/Labs and Tests Ordered: Current medicines are reviewed at length with the patient today.  Concerns regarding medicines are outlined above.   Tests Ordered: No orders of the defined types were placed in this encounter.   Medication Changes: No orders of the defined types were placed in this encounter.   Follow Up:  In Person in 6 month(s)  Signed, Larae Grooms, MD  01/10/2020 10:46 AM    [place]

## 2020-01-10 ENCOUNTER — Encounter: Payer: Self-pay | Admitting: Interventional Cardiology

## 2020-01-10 ENCOUNTER — Telehealth (INDEPENDENT_AMBULATORY_CARE_PROVIDER_SITE_OTHER): Payer: PPO | Admitting: Interventional Cardiology

## 2020-01-10 VITALS — BP 134/75 | HR 75 | Wt 136.0 lb

## 2020-01-10 DIAGNOSIS — I2510 Atherosclerotic heart disease of native coronary artery without angina pectoris: Secondary | ICD-10-CM

## 2020-01-10 DIAGNOSIS — U071 COVID-19: Secondary | ICD-10-CM

## 2020-01-10 DIAGNOSIS — I10 Essential (primary) hypertension: Secondary | ICD-10-CM | POA: Diagnosis not present

## 2020-01-10 DIAGNOSIS — Z87891 Personal history of nicotine dependence: Secondary | ICD-10-CM | POA: Diagnosis not present

## 2020-01-10 DIAGNOSIS — E785 Hyperlipidemia, unspecified: Secondary | ICD-10-CM

## 2020-01-10 DIAGNOSIS — Z7982 Long term (current) use of aspirin: Secondary | ICD-10-CM

## 2020-01-10 DIAGNOSIS — I340 Nonrheumatic mitral (valve) insufficiency: Secondary | ICD-10-CM | POA: Diagnosis not present

## 2020-01-10 NOTE — Patient Instructions (Signed)
Medication Instructions:  Your physician recommends that you continue on your current medications as directed. Please refer to the Current Medication list given to you today.  *If you need a refill on your cardiac medications before your next appointment, please call your pharmacy*  Lab Work: None ordered  If you have labs (blood work) drawn today and your tests are completely normal, you will receive your results only by: . MyChart Message (if you have MyChart) OR . A paper copy in the mail If you have any lab test that is abnormal or we need to change your treatment, we will call you to review the results.  Testing/Procedures: None ordered  Follow-Up: At [facility], you and your health needs are our priority.  As part of our continuing mission to provide you with exceptional heart care, we have created designated Provider Care Teams.  These Care Teams include your primary Cardiologist (physician) and Advanced Practice Providers (APPs -  Physician Assistants and Nurse Practitioners) who all work together to provide you with the care you need, when you need it.  Your next appointment:   6 month(s)  The format for your next appointment:   In Person  Provider:   You may see Jayadeep Varanasi, MD or one of the following Advanced Practice Providers on your designated Care Team:    Dayna Dunn, PA-C  Michele Lenze, PA-C   Other Instructions   

## 2020-01-11 DIAGNOSIS — L409 Psoriasis, unspecified: Secondary | ICD-10-CM | POA: Diagnosis not present

## 2020-01-11 DIAGNOSIS — Z9981 Dependence on supplemental oxygen: Secondary | ICD-10-CM | POA: Diagnosis not present

## 2020-01-11 DIAGNOSIS — Z85828 Personal history of other malignant neoplasm of skin: Secondary | ICD-10-CM | POA: Diagnosis not present

## 2020-01-11 DIAGNOSIS — E785 Hyperlipidemia, unspecified: Secondary | ICD-10-CM | POA: Diagnosis not present

## 2020-01-11 DIAGNOSIS — Z87891 Personal history of nicotine dependence: Secondary | ICD-10-CM | POA: Diagnosis not present

## 2020-01-11 DIAGNOSIS — Z955 Presence of coronary angioplasty implant and graft: Secondary | ICD-10-CM | POA: Diagnosis not present

## 2020-01-11 DIAGNOSIS — Z7902 Long term (current) use of antithrombotics/antiplatelets: Secondary | ICD-10-CM | POA: Diagnosis not present

## 2020-01-11 DIAGNOSIS — F329 Major depressive disorder, single episode, unspecified: Secondary | ICD-10-CM | POA: Diagnosis not present

## 2020-01-11 DIAGNOSIS — Z9181 History of falling: Secondary | ICD-10-CM | POA: Diagnosis not present

## 2020-01-11 DIAGNOSIS — Z7982 Long term (current) use of aspirin: Secondary | ICD-10-CM | POA: Diagnosis not present

## 2020-01-11 DIAGNOSIS — U071 COVID-19: Secondary | ICD-10-CM | POA: Diagnosis not present

## 2020-01-11 DIAGNOSIS — J1282 Pneumonia due to coronavirus disease 2019: Secondary | ICD-10-CM | POA: Diagnosis not present

## 2020-01-11 DIAGNOSIS — I10 Essential (primary) hypertension: Secondary | ICD-10-CM | POA: Diagnosis not present

## 2020-01-11 DIAGNOSIS — J9691 Respiratory failure, unspecified with hypoxia: Secondary | ICD-10-CM | POA: Diagnosis not present

## 2020-01-11 DIAGNOSIS — E559 Vitamin D deficiency, unspecified: Secondary | ICD-10-CM | POA: Diagnosis not present

## 2020-01-11 DIAGNOSIS — F419 Anxiety disorder, unspecified: Secondary | ICD-10-CM | POA: Diagnosis not present

## 2020-01-11 DIAGNOSIS — I2510 Atherosclerotic heart disease of native coronary artery without angina pectoris: Secondary | ICD-10-CM | POA: Diagnosis not present

## 2020-01-11 DIAGNOSIS — M25512 Pain in left shoulder: Secondary | ICD-10-CM | POA: Diagnosis not present

## 2020-01-11 DIAGNOSIS — I252 Old myocardial infarction: Secondary | ICD-10-CM | POA: Diagnosis not present

## 2020-01-14 ENCOUNTER — Other Ambulatory Visit: Payer: Self-pay | Admitting: Physician Assistant

## 2020-01-14 ENCOUNTER — Other Ambulatory Visit: Payer: Self-pay | Admitting: Internal Medicine

## 2020-01-14 DIAGNOSIS — R002 Palpitations: Secondary | ICD-10-CM

## 2020-01-14 NOTE — Telephone Encounter (Signed)
Please review for refill. Thanks!  

## 2020-01-21 DIAGNOSIS — I10 Essential (primary) hypertension: Secondary | ICD-10-CM | POA: Diagnosis not present

## 2020-01-21 DIAGNOSIS — J9691 Respiratory failure, unspecified with hypoxia: Secondary | ICD-10-CM | POA: Diagnosis not present

## 2020-01-21 DIAGNOSIS — M25512 Pain in left shoulder: Secondary | ICD-10-CM | POA: Diagnosis not present

## 2020-01-21 DIAGNOSIS — I2510 Atherosclerotic heart disease of native coronary artery without angina pectoris: Secondary | ICD-10-CM | POA: Diagnosis not present

## 2020-01-21 DIAGNOSIS — Z955 Presence of coronary angioplasty implant and graft: Secondary | ICD-10-CM | POA: Diagnosis not present

## 2020-01-21 DIAGNOSIS — Z7902 Long term (current) use of antithrombotics/antiplatelets: Secondary | ICD-10-CM | POA: Diagnosis not present

## 2020-01-21 DIAGNOSIS — I252 Old myocardial infarction: Secondary | ICD-10-CM | POA: Diagnosis not present

## 2020-01-21 DIAGNOSIS — F419 Anxiety disorder, unspecified: Secondary | ICD-10-CM | POA: Diagnosis not present

## 2020-01-21 DIAGNOSIS — U071 COVID-19: Secondary | ICD-10-CM | POA: Diagnosis not present

## 2020-01-21 DIAGNOSIS — Z9981 Dependence on supplemental oxygen: Secondary | ICD-10-CM | POA: Diagnosis not present

## 2020-01-21 DIAGNOSIS — J1282 Pneumonia due to coronavirus disease 2019: Secondary | ICD-10-CM | POA: Diagnosis not present

## 2020-01-21 DIAGNOSIS — E785 Hyperlipidemia, unspecified: Secondary | ICD-10-CM | POA: Diagnosis not present

## 2020-01-21 DIAGNOSIS — Z9181 History of falling: Secondary | ICD-10-CM | POA: Diagnosis not present

## 2020-01-21 DIAGNOSIS — L409 Psoriasis, unspecified: Secondary | ICD-10-CM | POA: Diagnosis not present

## 2020-01-21 DIAGNOSIS — Z7982 Long term (current) use of aspirin: Secondary | ICD-10-CM | POA: Diagnosis not present

## 2020-01-21 DIAGNOSIS — E559 Vitamin D deficiency, unspecified: Secondary | ICD-10-CM | POA: Diagnosis not present

## 2020-01-21 DIAGNOSIS — F329 Major depressive disorder, single episode, unspecified: Secondary | ICD-10-CM | POA: Diagnosis not present

## 2020-01-21 DIAGNOSIS — Z85828 Personal history of other malignant neoplasm of skin: Secondary | ICD-10-CM | POA: Diagnosis not present

## 2020-01-21 DIAGNOSIS — Z87891 Personal history of nicotine dependence: Secondary | ICD-10-CM | POA: Diagnosis not present

## 2020-01-24 DIAGNOSIS — U071 COVID-19: Secondary | ICD-10-CM | POA: Diagnosis not present

## 2020-01-25 DIAGNOSIS — M25512 Pain in left shoulder: Secondary | ICD-10-CM | POA: Diagnosis not present

## 2020-01-25 DIAGNOSIS — Z7982 Long term (current) use of aspirin: Secondary | ICD-10-CM | POA: Diagnosis not present

## 2020-01-25 DIAGNOSIS — I2510 Atherosclerotic heart disease of native coronary artery without angina pectoris: Secondary | ICD-10-CM | POA: Diagnosis not present

## 2020-01-25 DIAGNOSIS — E785 Hyperlipidemia, unspecified: Secondary | ICD-10-CM | POA: Diagnosis not present

## 2020-01-25 DIAGNOSIS — Z7902 Long term (current) use of antithrombotics/antiplatelets: Secondary | ICD-10-CM | POA: Diagnosis not present

## 2020-01-25 DIAGNOSIS — Z9981 Dependence on supplemental oxygen: Secondary | ICD-10-CM | POA: Diagnosis not present

## 2020-01-25 DIAGNOSIS — Z85828 Personal history of other malignant neoplasm of skin: Secondary | ICD-10-CM | POA: Diagnosis not present

## 2020-01-25 DIAGNOSIS — F419 Anxiety disorder, unspecified: Secondary | ICD-10-CM | POA: Diagnosis not present

## 2020-01-25 DIAGNOSIS — I252 Old myocardial infarction: Secondary | ICD-10-CM | POA: Diagnosis not present

## 2020-01-25 DIAGNOSIS — E559 Vitamin D deficiency, unspecified: Secondary | ICD-10-CM | POA: Diagnosis not present

## 2020-01-25 DIAGNOSIS — Z87891 Personal history of nicotine dependence: Secondary | ICD-10-CM | POA: Diagnosis not present

## 2020-01-25 DIAGNOSIS — J1282 Pneumonia due to coronavirus disease 2019: Secondary | ICD-10-CM | POA: Diagnosis not present

## 2020-01-25 DIAGNOSIS — F329 Major depressive disorder, single episode, unspecified: Secondary | ICD-10-CM | POA: Diagnosis not present

## 2020-01-25 DIAGNOSIS — I10 Essential (primary) hypertension: Secondary | ICD-10-CM | POA: Diagnosis not present

## 2020-01-25 DIAGNOSIS — L409 Psoriasis, unspecified: Secondary | ICD-10-CM | POA: Diagnosis not present

## 2020-01-25 DIAGNOSIS — Z9181 History of falling: Secondary | ICD-10-CM | POA: Diagnosis not present

## 2020-01-25 DIAGNOSIS — J9691 Respiratory failure, unspecified with hypoxia: Secondary | ICD-10-CM | POA: Diagnosis not present

## 2020-01-25 DIAGNOSIS — Z955 Presence of coronary angioplasty implant and graft: Secondary | ICD-10-CM | POA: Diagnosis not present

## 2020-01-25 DIAGNOSIS — U071 COVID-19: Secondary | ICD-10-CM | POA: Diagnosis not present

## 2020-01-30 ENCOUNTER — Telehealth: Payer: Self-pay | Admitting: Family Medicine

## 2020-01-30 DIAGNOSIS — E559 Vitamin D deficiency, unspecified: Secondary | ICD-10-CM | POA: Diagnosis not present

## 2020-01-30 DIAGNOSIS — J1282 Pneumonia due to coronavirus disease 2019: Secondary | ICD-10-CM | POA: Diagnosis not present

## 2020-01-30 DIAGNOSIS — Z9981 Dependence on supplemental oxygen: Secondary | ICD-10-CM | POA: Diagnosis not present

## 2020-01-30 DIAGNOSIS — F419 Anxiety disorder, unspecified: Secondary | ICD-10-CM | POA: Diagnosis not present

## 2020-01-30 DIAGNOSIS — M25512 Pain in left shoulder: Secondary | ICD-10-CM | POA: Diagnosis not present

## 2020-01-30 DIAGNOSIS — F329 Major depressive disorder, single episode, unspecified: Secondary | ICD-10-CM | POA: Diagnosis not present

## 2020-01-30 DIAGNOSIS — E785 Hyperlipidemia, unspecified: Secondary | ICD-10-CM | POA: Diagnosis not present

## 2020-01-30 DIAGNOSIS — Z85828 Personal history of other malignant neoplasm of skin: Secondary | ICD-10-CM | POA: Diagnosis not present

## 2020-01-30 DIAGNOSIS — I10 Essential (primary) hypertension: Secondary | ICD-10-CM | POA: Diagnosis not present

## 2020-01-30 DIAGNOSIS — J9691 Respiratory failure, unspecified with hypoxia: Secondary | ICD-10-CM | POA: Diagnosis not present

## 2020-01-30 DIAGNOSIS — U071 COVID-19: Secondary | ICD-10-CM | POA: Diagnosis not present

## 2020-01-30 DIAGNOSIS — I2510 Atherosclerotic heart disease of native coronary artery without angina pectoris: Secondary | ICD-10-CM | POA: Diagnosis not present

## 2020-01-30 DIAGNOSIS — Z7982 Long term (current) use of aspirin: Secondary | ICD-10-CM | POA: Diagnosis not present

## 2020-01-30 DIAGNOSIS — Z9181 History of falling: Secondary | ICD-10-CM | POA: Diagnosis not present

## 2020-01-30 DIAGNOSIS — I252 Old myocardial infarction: Secondary | ICD-10-CM | POA: Diagnosis not present

## 2020-01-30 DIAGNOSIS — Z87891 Personal history of nicotine dependence: Secondary | ICD-10-CM | POA: Diagnosis not present

## 2020-01-30 DIAGNOSIS — Z7902 Long term (current) use of antithrombotics/antiplatelets: Secondary | ICD-10-CM | POA: Diagnosis not present

## 2020-01-30 DIAGNOSIS — Z955 Presence of coronary angioplasty implant and graft: Secondary | ICD-10-CM | POA: Diagnosis not present

## 2020-01-30 DIAGNOSIS — L409 Psoriasis, unspecified: Secondary | ICD-10-CM | POA: Diagnosis not present

## 2020-01-30 NOTE — Telephone Encounter (Signed)
Pt is no longer needing her oxygen machine from where she had coivd. [facility] would need a release before they can take her off.

## 2020-02-01 NOTE — Telephone Encounter (Signed)
It is okay to give verbal authorization to stop O2 supplementation. Thanks, BJ

## 2020-02-01 NOTE — Telephone Encounter (Signed)
I sent a message to [facility] to pick up pt's oxygen.

## 2020-02-04 NOTE — Telephone Encounter (Signed)
Rx written for O2 to be picked up, awaiting fax number from [facility].

## 2020-02-21 DIAGNOSIS — U071 COVID-19: Secondary | ICD-10-CM | POA: Diagnosis not present

## 2020-02-28 ENCOUNTER — Other Ambulatory Visit: Payer: Self-pay | Admitting: Internal Medicine

## 2020-02-28 NOTE — Telephone Encounter (Signed)
Refill Request.  

## 2020-03-07 ENCOUNTER — Telehealth (INDEPENDENT_AMBULATORY_CARE_PROVIDER_SITE_OTHER): Payer: PPO | Admitting: Family Medicine

## 2020-03-07 ENCOUNTER — Encounter: Payer: Self-pay | Admitting: Family Medicine

## 2020-03-07 VITALS — HR 68

## 2020-03-07 DIAGNOSIS — I119 Hypertensive heart disease without heart failure: Secondary | ICD-10-CM | POA: Diagnosis not present

## 2020-03-07 DIAGNOSIS — J1282 Pneumonia due to coronavirus disease 2019: Secondary | ICD-10-CM | POA: Diagnosis not present

## 2020-03-07 DIAGNOSIS — E559 Vitamin D deficiency, unspecified: Secondary | ICD-10-CM

## 2020-03-07 DIAGNOSIS — E7800 Pure hypercholesterolemia, unspecified: Secondary | ICD-10-CM

## 2020-03-07 DIAGNOSIS — U071 COVID-19: Secondary | ICD-10-CM

## 2020-03-07 DIAGNOSIS — R000 Tachycardia, unspecified: Secondary | ICD-10-CM

## 2020-03-07 NOTE — Progress Notes (Signed)
Virtual Visit via Video Note   I connected with Erika Willhoite on 03/07/20 by a video enabled telemedicine application and verified that I am speaking with the correct person using two identifiers.  Location patient: home Location provider:work or office Persons participating in the virtual visit: patient, provider  I discussed the limitations of evaluation and management by telemedicine and the availability of in person appointments. The patient expressed understanding and agreed to proceed.   HPI: Erika Brown is a 79 yo female being seen today for chronic disease management. She was last seen on 01/07/20. No new problems since her last visit. S/P COVID 19 pneumonia. She is not longer in need of supplemental O2.  She has not had supplemental O2 since beginning of 01/2020. Negative for fever,chills,cough,wheezing,or SOB. Appetite and physical activity back to baseline.  HTN and PVC's : Last visit Metoprolol dose was increased because persistent tachycardia.  She is on Amlodipine 5 mg daily,Irbersartan-HCTZ 300-12.5 mg daily,and Metoprolol succinate 50 mg 1.5 tab daily.  BP's: 120's-130's/60-70's. HR's: 60's-70's. Negative for severe/frequent headache, visual changes, chest pain, palpitation, focal weakness, or edema.  Lab Results  Component Value Date   CREATININE 0.65 12/24/2019   BUN 26 (H) 12/24/2019   NA 136 12/24/2019   K 3.8 12/24/2019   CL 102 12/24/2019   CO2 25 12/24/2019   HLD: Currently she is on Zetia 10 mg daily. Has not tolerated statin.  Lab Results  Component Value Date   CHOL 207 (H) 01/26/2019   HDL 87 01/26/2019   LDLCALC 108 (H) 01/26/2019   LDLDIRECT 121.7 09/08/2007   TRIG 75 12/20/2019   CHOLHDL 2.4 01/26/2019   Vit D deficiency: She is on Ergocalciferol 50,000 U weekly.  ROS: See pertinent positives and negatives per HPI.  Past Medical History:  Diagnosis Date  . Anxiety and depression   . CAD (coronary artery disease)    S/P NSTEMI 4/06 EF 60%;  Treated with Cypher DES to LAD and RCA with kissing balloon PCI diagonal; Treadmill myoview 10/09 for CP: Walked 7:06. mild ST-T-wave changes in recovery. EF 71%. Very mild reversible defect in base of the inferior wall --> medical rx.  . Cancer ([place])    HISTORY OF SKIN CANCER  . Cataract   . Complication of anesthesia    " DIFFICULT TO WAKE UP "  . Depression   . Glucose intolerance (impaired glucose tolerance)   . History of echocardiogram    Echo 11/17: Vigorous LVF, EF 65-70, normal wall motion, borderline diastolic function, systolic bowing of mitral valve without prolapse, mild MR, trivial TR, PASP 20  . History of medication noncompliance   . Hyperlipidemia    Intolerate to multiple statins due to severe myalgias  . Hypertension    Possible white-coat component  . MI (myocardial infarction) ([place])    acute-s/p stent  . Microhematuria   . Psoriasis   . Rapid heart beat   . Vitamin D deficiency     Past Surgical History:  Procedure Laterality Date  . ABDOMINAL HYSTERECTOMY     and anterior repair  . APPENDECTOMY    . bladder tact    . BREAST BIOPSY    . CARDIAC CATHETERIZATION  02/28/2018  . CATARACT EXTRACTION Bilateral 2017   Dr. Katy Fitch  . CORONARY STENT INTERVENTION N/A 02/28/2018   Procedure: CORONARY STENT INTERVENTION;  Surgeon: Jettie Booze, MD;  Location: [place] [facility];  Service: Cardiovascular;  Laterality: N/A;  . CORONARY STENT PLACEMENT    .  CORONARY STENT PLACEMENT  04/02/019  . LEFT HEART CATH AND CORONARY ANGIOGRAPHY N/A 02/28/2018   Procedure: LEFT HEART CATH AND CORONARY ANGIOGRAPHY;  Surgeon: Jettie Booze, MD;  Location: [place] [facility];  Service: Cardiovascular;  Laterality: N/A;  . MOHS SURGERY  9/14   BCC.  [facility]  . TONSILLECTOMY AND ADENOIDECTOMY    . ULTRASOUND GUIDANCE FOR VASCULAR ACCESS  02/28/2018   Procedure: Ultrasound Guidance For Vascular Access;  Surgeon: Jettie Booze, MD;  Location: [place] [facility];   Service: Cardiovascular;;    Family History  Problem Relation Age of Onset  . Heart disease Father   . Heart attack Father   . Ulcers Father   . Hypertension Mother   . Melanoma Mother   . Cancer Daughter        melanoma  . Breast cancer Daughter   . Stroke Maternal Grandmother   . Hypertension Maternal Grandmother   . Stroke Maternal Grandfather   . Renal Disease Maternal Grandfather        kidney removed  . Hypertension Maternal Grandfather   . Diabetes Paternal Grandmother   . Melanoma Child        Metastatic  . Breast cancer Sister 5  . Hypertension Brother   . Asthma Brother   . Depression Daughter        x2  . Rectal cancer Son   . Coronary artery disease Neg Hx     Social History   Socioeconomic History  . Marital status: Married    Spouse name: Not on file  . Number of children: 7  . Years of education: Not on file  . Highest education level: Not on file  Occupational History  . Occupation: Works at home, Charity fundraiser, [place] Use  . Smoking status: Former Research scientist (life sciences)  . Smokeless tobacco: Never Used  Substance and Sexual Activity  . Alcohol use: Yes    Alcohol/week: 4.0 standard drinks    Types: 4 Glasses of wine per week  . Drug use: No  . Sexual activity: Not Currently    Birth control/protection: Abstinence, Surgical    Comment: [facility]  Other Topics Concern  . Not on file  Social History Narrative   Lives in [place] with her husband   Seven kids   Plays tennis   Very good card player   Social Determinants of Health   Financial Resource Strain:   . Difficulty of Paying Living Expenses:   Food Insecurity:   . Worried About Charity fundraiser in the Last Year:   . Arboriculturist in the Last Year:   Transportation Needs:   . Film/video editor (Medical):   [name] Lack of Transportation (Non-Medical):   Physical Activity:   . Days of Exercise per Week:   . Minutes of Exercise per Session:   Stress:   . Feeling of Stress :    Social Connections:   . Frequency of Communication with Friends and Family:   . Frequency of Social Gatherings with Friends and Family:   . Attends Religious Services:   . Active Member of Clubs or Organizations:   . Attends Archivist Meetings:   [name] Marital Status:   Intimate Partner Violence:   . Fear of Current or Ex-Partner:   . Emotionally Abused:   [name] Physically Abused:   . Sexually Abused:     Current Outpatient Medications:  .  amLODipine (NORVASC) 5 MG tablet, TAKE 1 TABLET  ONCE DAILY., Disp: 90 tablet, Rfl: 0 .  aspirin 81 MG EC tablet, Take 81 mg by mouth daily.  , Disp: , Rfl:  .  benzonatate (TESSALON) 200 MG capsule, Take 1 capsule (200 mg total) by mouth 3 (three) times daily as needed for cough., Disp: 20 capsule, Rfl: 0 .  chlorpheniramine-HYDROcodone (TUSSIONEX) 10-8 MG/5ML SUER, Take 5 mLs by mouth every 12 (twelve) hours as needed for cough., Disp: 140 mL, Rfl: 0 .  clopidogrel (PLAVIX) 75 MG tablet, TAKE 1 TABLET ONCE DAILY., Disp: 90 tablet, Rfl: 3 .  ezetimibe (ZETIA) 10 MG tablet, TAKE 1 TABLET ONCE DAILY., Disp: 90 tablet, Rfl: 3 .  irbesartan-hydrochlorothiazide (AVALIDE) 300-12.5 MG tablet, TAKE 1 TABLET ONCE DAILY., Disp: 90 tablet, Rfl: 3 .  ivermectin (STROMECTOL) 3 MG TABS tablet, SMARTSIG:4.5 Tablet(s) By Mouth Twice Daily, Disp: , Rfl:  .  meclizine (ANTIVERT) 25 MG tablet, Take 12.5 mg by mouth 3 (three) times daily as needed for dizziness. , Disp: , Rfl:  .  metoprolol succinate (TOPROL-XL) 50 MG 24 hr tablet, TAKE 1 TABLET ONCE DAILY., Disp: 90 tablet, Rfl: 3 .  nitroGLYCERIN (NITROSTAT) 0.4 MG SL tablet, Place 1 tablet (0.4 mg total) under the tongue every 5 (five) minutes as needed for chest pain., Disp: 25 tablet, Rfl: 0 .  potassium chloride SA (KLOR-CON) 20 MEQ tablet, TAKE 1 TABLET ONCE DAILY., Disp: 90 tablet, Rfl: 3 .  predniSONE (DELTASONE) 10 MG tablet, Take 40 mg daily for 1 day, 30 mg daily for 1 day, 20 mg daily for 1 days,10 mg  daily for 1 day, then stop, Disp: 10 tablet, Rfl: 0 .  Vitamin D, Ergocalciferol, (DRISDOL) 1.25 MG (50000 UNIT) CAPS capsule, Take 1 capsule (50,000 Units total) by mouth every 7 (seven) days., Disp: 12 capsule, Rfl: 1 .  Zinc Sulfate (ZINC 15 PO), Take by mouth., Disp: , Rfl:   EXAM:  VITALS per patient if applicable:Pulse 68   LMP 11/29/1974   SpO2 97%   GENERAL: alert, oriented, appears well and in no acute distress  HEENT: atraumatic, conjunctiva clear, no obvious abnormalities on inspection.  NECK: normal movements of the head and neck  LUNGS: on inspection no signs of respiratory distress, breathing rate appears normal, no obvious gross SOB, gasping or wheezing  CV: no obvious cyanosis  Erika: moves all visible extremities without noticeable abnormality  PSYCH/NEURO: pleasant and cooperative, no obvious depression or anxiety, speech and thought processing grossly intact  ASSESSMENT AND PLAN:  Discussed the following assessment and plan:  Pure hypercholesterolemia - Plan: Lipid panel, Hepatic function panel Continue Zetia 10 mg daily and low fat diet. Lab appt will be arranged.  Sinus tachycardia Well controlled. Continue Metoprolol Tartrate 75 mg daily. Continue monitoring HR.  Hypertension with heart disease BP adequately controlled. No changes in current management. Continue monitoring BP.  Vitamin D deficiency, unspecified - Plan: VITAMIN D 25 Hydroxy (Vit-D Deficiency, Fractures) No changes in current management. Vit D will be adjusted according to 25 [place] vit D results.  Pneumonia due to COVID-19 virus - Plan: SARS-COV-2 IgG She would like to have ab testing done.   I discussed the assessment and treatment plan with the patient. Erika Desorbo  was provided an opportunity to ask questions and all were answered. She agreed with the plan and demonstrated an understanding of the instructions.   Return in about 6 months (around 09/06/2020) for labs withing the next few  days,fasting..    Kassiah Mccrory Martinique, MD

## 2020-03-10 ENCOUNTER — Telehealth: Payer: Self-pay | Admitting: Family Medicine

## 2020-03-10 ENCOUNTER — Other Ambulatory Visit: Payer: Self-pay | Admitting: *Deleted

## 2020-03-10 NOTE — Telephone Encounter (Signed)
Pt states she saw on my-chart some medication she is no longer taking and would like removed/inactive from her chart.  Those medications are Benzonatate, Chlorpheniramine, Prednisone, and Ivermectin.   Pt also would like to know if she is due for tetanus shot?    Pt can be reached at [phone]

## 2020-03-10 NOTE — Telephone Encounter (Signed)
Medication list updated. Patient informed. Nurse visit for Tdap scheduled.

## 2020-03-10 NOTE — Telephone Encounter (Signed)
LVM to set up 6 month follow up per Martinique (around 09/06/20)

## 2020-03-11 ENCOUNTER — Other Ambulatory Visit: Payer: Self-pay

## 2020-03-12 ENCOUNTER — Other Ambulatory Visit (INDEPENDENT_AMBULATORY_CARE_PROVIDER_SITE_OTHER): Payer: PPO

## 2020-03-12 DIAGNOSIS — J1282 Pneumonia due to coronavirus disease 2019: Secondary | ICD-10-CM

## 2020-03-12 DIAGNOSIS — E7800 Pure hypercholesterolemia, unspecified: Secondary | ICD-10-CM

## 2020-03-12 DIAGNOSIS — U071 COVID-19: Secondary | ICD-10-CM

## 2020-03-12 DIAGNOSIS — E559 Vitamin D deficiency, unspecified: Secondary | ICD-10-CM | POA: Diagnosis not present

## 2020-03-12 LAB — LIPID PANEL
Cholesterol: 215 mg/dL — ABNORMAL HIGH (ref 0–200)
HDL: 72.2 mg/dL (ref >39.00)
LDL Cholesterol: 128 mg/dL — ABNORMAL HIGH (ref 0–99)
NonHDL: 143.18
Total CHOL/HDL Ratio: 3
Triglycerides: 78 mg/dL (ref 0.0–149.0)
VLDL: 15.6 mg/dL (ref 0.0–40.0)

## 2020-03-12 LAB — VITAMIN D 25 HYDROXY (VIT D DEFICIENCY, FRACTURES): VITD: 52.95 ng/mL (ref 30.00–100.00)

## 2020-03-12 LAB — HEPATIC FUNCTION PANEL
ALT: 18 U/L (ref 0–35)
AST: 17 U/L (ref 0–37)
Albumin: 4.1 g/dL (ref 3.5–5.2)
Alkaline Phosphatase: 76 U/L (ref 39–117)
Bilirubin, Direct: 0.1 mg/dL (ref 0.0–0.3)
Total Bilirubin: 0.5 mg/dL (ref 0.2–1.2)
Total Protein: 6.6 g/dL (ref 6.0–8.3)

## 2020-03-12 LAB — SARS-COV-2 IGG: SARS-COV-2 IgG: 47.38

## 2020-03-19 ENCOUNTER — Encounter: Payer: Self-pay | Admitting: Family Medicine

## 2020-03-21 ENCOUNTER — Other Ambulatory Visit: Payer: Self-pay

## 2020-03-23 DIAGNOSIS — U071 COVID-19: Secondary | ICD-10-CM | POA: Diagnosis not present

## 2020-03-24 ENCOUNTER — Other Ambulatory Visit: Payer: Self-pay

## 2020-03-24 ENCOUNTER — Ambulatory Visit (INDEPENDENT_AMBULATORY_CARE_PROVIDER_SITE_OTHER): Payer: PPO | Admitting: *Deleted

## 2020-03-24 DIAGNOSIS — Z23 Encounter for immunization: Secondary | ICD-10-CM

## 2020-03-24 NOTE — Progress Notes (Signed)
Per orders of Dr. Martinique, injection of Tdap Vaccination given by Zacarias Pontes. Patient tolerated injection well.

## 2020-04-03 ENCOUNTER — Other Ambulatory Visit: Payer: Self-pay

## 2020-04-03 NOTE — Progress Notes (Signed)
79 y.o. G105P7 Married White or Caucasian female here for annual exam.  Husband fell and fractured his wrist and a pelvic fracture.  He was in four locations prior to being admitted.  He was diagnosed with Covid in a week and then passed.  This was in January.    Pt then had Covid and had Covid pneumonia.    Son, in [place], was hospitalized in January.  He had new onset seizures and was ultimately diagnosed with a small bowel obstruction.  He had Covid in November as well.    Denies vaginal bleeding.    Had blood work in April with Dr. Martinique.  Has antibody testing showing she still has antibodies.    Patient's last menstrual period was 11/29/1974.          Sexually active: No.  The current method of family planning is status post hysterectomy.    Exercising: Yes.    exercise Smoker:  no  Health Maintenance: Pap:  2011 neg History of abnormal Pap:  yes MMG:  2008. Declines  Colonoscopy:  2005 normal, cologuard neg 2020 BMD:   2008 TDaP:  2021 Pneumonia vaccine(s):  2020 pcp Shingrix:  Not done, declined Hep C testing: not done Screening Labs: with Dr. Martinique   reports that she has quit smoking. She has never used smokeless tobacco. She reports current alcohol use of about 4.0 standard drinks of alcohol per week. She reports that she does not use drugs.  Past Medical History:  Diagnosis Date  . Anxiety and depression   . CAD (coronary artery disease)    S/P NSTEMI 4/06 EF 60%; Treated with Cypher DES to LAD and RCA with kissing balloon PCI diagonal; Treadmill myoview 10/09 for CP: Walked 7:06. mild ST-T-wave changes in recovery. EF 71%. Very mild reversible defect in base of the inferior wall --> medical rx.  . Cancer ([place])    HISTORY OF SKIN CANCER  . Cataract   . Complication of anesthesia    " DIFFICULT TO WAKE UP "  . Depression   . Glucose intolerance (impaired glucose tolerance)   . History of echocardiogram    Echo 11/17: Vigorous LVF, EF 65-70, normal wall motion,  borderline diastolic function, systolic bowing of mitral valve without prolapse, mild MR, trivial TR, PASP 20  . History of medication noncompliance   . Hyperlipidemia    Intolerate to multiple statins due to severe myalgias  . Hypertension    Possible white-coat component  . MI (myocardial infarction) ([place])    acute-s/p stent  . Microhematuria   . Psoriasis   . Rapid heart beat   . Vitamin D deficiency     Past Surgical History:  Procedure Laterality Date  . ABDOMINAL HYSTERECTOMY     and anterior repair  . APPENDECTOMY    . bladder tact    . BREAST BIOPSY    . CARDIAC CATHETERIZATION  02/28/2018  . CATARACT EXTRACTION Bilateral 2017   Dr. Katy Fitch  . CORONARY STENT INTERVENTION N/A 02/28/2018   Procedure: CORONARY STENT INTERVENTION;  Surgeon: Jettie Booze, MD;  Location: [facility];  Service: Cardiovascular;  Laterality: N/A;  . CORONARY STENT PLACEMENT    . CORONARY STENT PLACEMENT  04/02/019  . LEFT HEART CATH AND CORONARY ANGIOGRAPHY N/A 02/28/2018   Procedure: LEFT HEART CATH AND CORONARY ANGIOGRAPHY;  Surgeon: Jettie Booze, MD;  Location: [facility];  Service: Cardiovascular;  Laterality: N/A;  . MOHS SURGERY  9/14   BCC.  [facility]  . TONSILLECTOMY AND ADENOIDECTOMY    . ULTRASOUND GUIDANCE FOR VASCULAR ACCESS  02/28/2018   Procedure: Ultrasound Guidance For Vascular Access;  Surgeon: Jettie Booze, MD;  Location: [facility];  Service: Cardiovascular;;    Current Outpatient Medications  Medication Sig Dispense Refill  . amLODipine (NORVASC) 5 MG tablet TAKE 1 TABLET ONCE DAILY. 90 tablet 0  . aspirin 81 MG EC tablet Take 81 mg by mouth daily.      . clopidogrel (PLAVIX) 75 MG tablet TAKE 1 TABLET ONCE DAILY. 90 tablet 3  . ezetimibe (ZETIA) 10 MG tablet TAKE 1 TABLET ONCE DAILY. (Patient taking differently: Taking 1/2) 90 tablet 3  . irbesartan-hydrochlorothiazide (AVALIDE) 300-12.5 MG tablet TAKE 1 TABLET ONCE DAILY. 90 tablet  3  . metoprolol succinate (TOPROL-XL) 50 MG 24 hr tablet TAKE 1 TABLET ONCE DAILY. (Patient taking differently: Taking 1 1/4) 90 tablet 3  . potassium chloride SA (KLOR-CON) 20 MEQ tablet TAKE 1 TABLET ONCE DAILY. 90 tablet 3  . Vitamin D, Ergocalciferol, (DRISDOL) 1.25 MG (50000 UNIT) CAPS capsule Take 1 capsule (50,000 Units total) by mouth every 7 (seven) days. 12 capsule 1  . Zinc Sulfate (ZINC 15 PO) Take by mouth.    . nitroGLYCERIN (NITROSTAT) 0.4 MG SL tablet Place 1 tablet (0.4 mg total) under the tongue every 5 (five) minutes as needed for chest pain. (Patient not taking: Reported on 04/04/2020) 25 tablet 0   No current facility-administered medications for this visit.    Family History  Problem Relation Age of Onset  . Heart disease Father   . Heart attack Father   . Ulcers Father   . Hypertension Mother   . Melanoma Mother   . Cancer Daughter        melanoma  . Breast cancer Daughter   . Stroke Maternal Grandmother   . Hypertension Maternal Grandmother   . Stroke Maternal Grandfather   . Renal Disease Maternal Grandfather        kidney removed  . Hypertension Maternal Grandfather   . Diabetes Paternal Grandmother   . Melanoma Child        Metastatic  . Breast cancer Sister 72  . Hypertension Brother   . Asthma Brother   . Depression Daughter        x2  . Rectal cancer Son   . Coronary artery disease Neg Hx     Review of Systems  Constitutional: Negative.   HENT: Negative.   Eyes: Negative.   Respiratory: Negative.   Cardiovascular: Negative.   Gastrointestinal: Negative.   Endocrine: Negative.   Genitourinary: Negative.   Musculoskeletal: Negative.   Skin: Negative.   Allergic/Immunologic: Negative.   Neurological: Negative.   Psychiatric/Behavioral: Negative.     Exam:   BP 122/78   Pulse 80   Temp (!) 97.2 F (36.2 C) (Skin)   Resp 16   Ht 5' 4.75" (1.645 m)   Wt 140 lb (63.5 kg)   LMP 11/29/1974   BMI 23.48 kg/m   Height: 5' 4.75" (164.5  cm)  General appearance: alert, cooperative and appears stated age Head: Normocephalic, without obvious abnormality, atraumatic Neck: no adenopathy, supple, symmetrical, trachea midline and thyroid normal to inspection and palpation Lungs: clear to auscultation bilaterally Breasts: normal appearance, no masses or tenderness Heart: regular rate and rhythm Abdomen: soft, non-tender; bowel sounds normal; no masses,  no organomegaly Extremities: extremities normal, atraumatic, no cyanosis or edema Skin: Skin color, texture, turgor normal. No  rashes or lesions Lymph nodes: Cervical, supraclavicular, and axillary nodes normal. No abnormal inguinal nodes palpated Neurologic: Grossly normal   Pelvic: External genitalia:  no lesions              Urethra:  normal appearing urethra with no masses, tenderness or lesions              Bartholins and Skenes: normal                 Vagina: normal appearing vagina with normal color and discharge, no lesions              Cervix: no lesions              Pap taken: No. Bimanual Exam:  Uterus:  normal size, contour, position, consistency, mobility, non-tender              Adnexa: normal adnexa and no mass, fullness, tenderness               Rectovaginal: Confirms               Anus:  normal sphincter tone, no lesions  Chaperone, Royal Hawthorn, CMA, was present for exam.  A:  Well Woman with normal exam PMP, no HRT Hypertension H/o vulvar rash, biopsy showed spongiotic psoriasis H/o cardiac stents  P:   Mammogram guidelines reviewed.  Declines MMG. pap smear not indicated Cologuard 2000.  Not indicated now. Blood work done with Dr. Martinique in April Tdap was just updated.   Return annually or prn

## 2020-04-04 ENCOUNTER — Ambulatory Visit (INDEPENDENT_AMBULATORY_CARE_PROVIDER_SITE_OTHER): Payer: PPO | Admitting: Obstetrics & Gynecology

## 2020-04-04 ENCOUNTER — Encounter: Payer: Self-pay | Admitting: Obstetrics & Gynecology

## 2020-04-04 VITALS — BP 122/78 | HR 80 | Temp 97.2°F | Resp 16 | Ht 64.75 in | Wt 140.0 lb

## 2020-04-04 DIAGNOSIS — Z01419 Encounter for gynecological examination (general) (routine) without abnormal findings: Secondary | ICD-10-CM

## 2020-04-22 ENCOUNTER — Telehealth: Payer: Self-pay | Admitting: Family Medicine

## 2020-04-22 NOTE — Telephone Encounter (Signed)
Erika Brown from [facility] stated that theu picked up the pt's oxygen but they have a certificate of medical necessity that needs to be completed and signed for the time she had the oxygen which is January 2021 to March 2021. She will fax it this afternoon. Verified our fax number with [facility].   [facility]  Phone: [phone]  [place]: [phone]

## 2020-04-25 NOTE — Telephone Encounter (Signed)
Left detailed message for Erika Brown informing her that fax was received and that PCP is out of the office until 05/05/2020. I will have her sign then and fax form back.

## 2020-05-01 ENCOUNTER — Other Ambulatory Visit: Payer: Self-pay | Admitting: Interventional Cardiology

## 2020-05-06 NOTE — Telephone Encounter (Signed)
Form completed and faxed back to [facility] at [facility].

## 2020-05-15 ENCOUNTER — Other Ambulatory Visit: Payer: Self-pay | Admitting: Interventional Cardiology

## 2020-06-09 ENCOUNTER — Other Ambulatory Visit: Payer: Self-pay | Admitting: Family Medicine

## 2020-06-25 ENCOUNTER — Ambulatory Visit: Payer: PPO | Attending: Internal Medicine

## 2020-06-25 DIAGNOSIS — Z23 Encounter for immunization: Secondary | ICD-10-CM

## 2020-06-25 LAB — GLUCOSE, POCT (MANUAL RESULT ENTRY): POC Glucose: 122 mg/dl — AB (ref 70–99)

## 2020-06-25 NOTE — Progress Notes (Signed)
   Covid-19 Vaccination Clinic  Name:  NEIMA LACROSS    MRN: [id number] DOB: 04/14/1941  06/25/2020  Ms. Sloan was observed post Covid-19 immunization for 15 minutes without incident. She was provided with Vaccine Information Sheet and instruction to access the V-Safe system.   Ms. Osterman was instructed to call 911 with any severe reactions post vaccine: [name] Difficulty breathing  . Swelling of face and throat  . A fast heartbeat  . A bad rash all over body  . Dizziness and weakness   Immunizations Administered    Name Date Dose VIS Date Route   Pfizer COVID-19 Vaccine 06/25/2020  1:04 PM 0.3 mL 01/23/2019 Intramuscular   Manufacturer: [employer], [employer]   Lot: [id number]   [place]: [id number]

## 2020-07-07 ENCOUNTER — Other Ambulatory Visit: Payer: Self-pay | Admitting: Internal Medicine

## 2020-07-16 DIAGNOSIS — Z20822 Contact with and (suspected) exposure to covid-19: Secondary | ICD-10-CM | POA: Diagnosis not present

## 2020-07-20 NOTE — Progress Notes (Signed)
Cardiology Office Note   Date:  07/20/2020   ID:  Maurina, Fawaz 1941-11-26, MRN [id number]  PCP:  Martinique, Betty G, MD    No chief complaint on file.  CAD  Wt Readings from Last 3 Encounters:  04/04/20 140 lb (63.5 kg)  01/10/20 136 lb (61.7 kg)  12/20/19 140 lb (63.5 kg)       History of Present Illness: JANEANE COZART is a 79 y.o. female  with CAD whois aformer patient of Dr. Saunders Revel with coronary artery disease status post Cypher DES to the LAD and Cypher DES to the RCA as well as balloon angioplasty of the diagonal in March 02, 2005 in the setting of non-ST elevation myocardial infarction, hypertension, hyperlipidemia. Most recently, she underwent cardiac catheterization inApril 02-Mar-2018 after an abnormal stress test. This demonstrated in-stent restenosis in the LAD as well as severe stenosis in the LCx and RCA. She underwent multivessel stenting with a DES to the proximal-mid LAD, DES to the mid LCx and DES to the proximal-mid RCA. Last seen by Dr. Irving Burton July 2019. She complained of some palpitations and her beta-blocker dose was adjusted. They also discussed +/- ezetimibe therapy. She has intolerance to multiple statins and is not inclined to try PCSK-9 inhibitor therapy.  She had three vessel stenting in April 2019.   SHe was trying to eat healthier. She was decreasing meat intake. She started Zetia in 09/29/2023.    Her husband died from [place] in early 03-02-20.  He was diagnosed with COVID in early Jan.  She got COVID from him at a nursing home. She was hospitalized for 4 days. She is feeling better now.  She received steroids, remdesivir.   In Feb 2021, it was noted: "She feels that she is having nosebleeds.  HR seems to be higher in general. Even now, while she is talking, HR is 103. Using oxygen at night, 2L. "   I recommended a humidifier to help.   Sine the last visit, she has done well.    Denies : Chest pain. Dizziness. Leg edema. Nitroglycerin use. Orthopnea.  Palpitations. Paroxysmal nocturnal dyspnea. Shortness of breath. Syncope.   She had an episode with multiple bee stings yesterday.  Since COVID, she feels some "pinching feeling" in random places that last a few seconds.   She got 1 dose of COVID vaccine in July 28.  She wants to postpone the second reaction.     Past Medical History:  Diagnosis Date  . Anxiety and depression   . CAD (coronary artery disease)    S/P NSTEMI 4/06 EF 60%; Treated with Cypher DES to LAD and RCA with kissing balloon PCI diagonal; Treadmill myoview 10/09 for CP: Walked 7:06. mild ST-T-wave changes in recovery. EF 71%. Very mild reversible defect in base of the inferior wall --> medical rx.  . Cancer ([place])    HISTORY OF SKIN CANCER  . Cataract   . Complication of anesthesia    " DIFFICULT TO WAKE UP "  . Depression   . Glucose intolerance (impaired glucose tolerance)   . History of echocardiogram    Echo 11/17: Vigorous LVF, EF 65-70, normal wall motion, borderline diastolic function, systolic bowing of mitral valve without prolapse, mild MR, trivial TR, PASP 20  . History of medication noncompliance   . Hyperlipidemia    Intolerate to multiple statins due to severe myalgias  . Hypertension    Possible white-coat component  . MI (myocardial infarction) ([place])    acute-s/p  stent  . Microhematuria   . Psoriasis   . Rapid heart beat   . Vitamin D deficiency     Past Surgical History:  Procedure Laterality Date  . ABDOMINAL HYSTERECTOMY     and anterior repair  . APPENDECTOMY    . bladder tact    . BREAST BIOPSY    . CARDIAC CATHETERIZATION  02/28/2018  . CATARACT EXTRACTION Bilateral 2017   Dr. Katy Fitch  . CORONARY STENT INTERVENTION N/A 02/28/2018   Procedure: CORONARY STENT INTERVENTION;  Surgeon: Jettie Booze, MD;  Location: [facility];  Service: Cardiovascular;  Laterality: N/A;  . CORONARY STENT PLACEMENT    . CORONARY STENT PLACEMENT  04/02/019  . LEFT HEART CATH AND CORONARY  ANGIOGRAPHY N/A 02/28/2018   Procedure: LEFT HEART CATH AND CORONARY ANGIOGRAPHY;  Surgeon: Jettie Booze, MD;  Location: [facility];  Service: Cardiovascular;  Laterality: N/A;  . MOHS SURGERY  9/14   BCC.  [facility]  . TONSILLECTOMY AND ADENOIDECTOMY    . ULTRASOUND GUIDANCE FOR VASCULAR ACCESS  02/28/2018   Procedure: Ultrasound Guidance For Vascular Access;  Surgeon: Jettie Booze, MD;  Location: [facility];  Service: Cardiovascular;;     Current Outpatient Medications  Medication Sig Dispense Refill  . amLODipine (NORVASC) 5 MG tablet TAKE 1 TABLET ONCE DAILY. 90 tablet 0  . aspirin 81 MG EC tablet Take 81 mg by mouth daily.      . clopidogrel (PLAVIX) 75 MG tablet TAKE 1 TABLET ONCE DAILY. 90 tablet 3  . ezetimibe (ZETIA) 10 MG tablet TAKE 1 TABLET ONCE DAILY. (Patient taking differently: Taking 1/2) 90 tablet 3  . irbesartan-hydrochlorothiazide (AVALIDE) 300-12.5 MG tablet TAKE 1 TABLET ONCE DAILY. 90 tablet 1  . metoprolol succinate (TOPROL-XL) 50 MG 24 hr tablet TAKE 1 TABLET ONCE DAILY. (Patient taking differently: Taking 1 1/4) 90 tablet 3  . nitroGLYCERIN (NITROSTAT) 0.4 MG SL tablet DISSOLVE 1 TABLET UNDER TONGUE EVERY 5 MINUTES AS NEEDED FOR CHEST PAIN. 25 tablet 0  . potassium chloride SA (KLOR-CON) 20 MEQ tablet TAKE 1 TABLET ONCE DAILY. 90 tablet 3  . Vitamin D, Ergocalciferol, (DRISDOL) 1.25 MG (50000 UNIT) CAPS capsule TAKE 1 CAPSULE ONCE A WEEK. 12 capsule 0  . Zinc Sulfate (ZINC 15 PO) Take by mouth.     No current facility-administered medications for this visit.    Allergies:   Iodine, Latex, Other, and Statins    Social History:  The patient  reports that she has quit smoking. She has never used smokeless tobacco. She reports current alcohol use of about 4.0 standard drinks of alcohol per week. She reports that she does not use drugs.   Family History:  The patient's family history includes Asthma in her brother; Breast cancer in her  daughter; Breast cancer (age of onset: 4) in her sister; Cancer in her daughter; Depression in her daughter; Diabetes in her paternal grandmother; Heart attack in her father; Heart disease in her father; Hypertension in her brother, maternal grandfather, maternal grandmother, and mother; Melanoma in her child and mother; Rectal cancer in her son; Renal Disease in her maternal grandfather; Stroke in her maternal grandfather and maternal grandmother; Ulcers in her father.    ROS:  Please see the history of present illness.   Otherwise, review of systems are positive for recent bee stings.   All other systems are reviewed and negative.    PHYSICAL EXAM: VS:  LMP 11/29/1974  , BMI  There is no height or weight on file to calculate BMI. GEN: Well nourished, well developed, in no acute distress  HEENT: normal  Neck: no JVD, carotid bruits, or masses Cardiac: RRR, with premature beats; no murmurs, rubs, or gallops,no edema  Respiratory:  clear to auscultation bilaterally, normal work of breathing GI: soft, nontender, nondistended, + BS MS: no deformity or atrophy ; areas of swelling on the left arm Skin: warm and dry, no rash Neuro:  Strength and sensation are intact Psych: euthymic mood, full affect   EKG:   The ekg ordered Jan 2021 demonstrates sinus tach, no ST changes   Recent Labs: 12/21/2019: B Natriuretic Peptide 89.2 12/24/2019: BUN 26; Creatinine, Ser 0.65; Hemoglobin 11.8; Platelets 314; Potassium 3.8; Sodium 136 03/12/2020: ALT 18   Lipid Panel    Component Value Date/Time   CHOL 215 (H) 03/12/2020 0844   CHOL 207 (H) 01/26/2019 0921   TRIG 78.0 03/12/2020 0844   HDL 72.20 03/12/2020 0844   HDL 87 01/26/2019 0921   CHOLHDL 3 03/12/2020 0844   VLDL 15.6 03/12/2020 0844   LDLCALC 128 (H) 03/12/2020 0844   LDLCALC 108 (H) 01/26/2019 0921   LDLDIRECT 121.7 09/08/2007 9811     Other studies Reviewed: Additional studies/ records that were reviewed today with results  demonstrating: hospital records reviewed.   ASSESSMENT AND PLAN:  1. CAD: No angina.  Continue aggressive secondary prevention.  S/p PCI. 2. HTN: The current medical regimen is effective;  continue present plan and medications.  Low salt diet. 3. Hyperlipidemia: LDL 128.  COntinue Zetia.  She is intolerant of statins and not interested in PCSK9 inhibitor.  4. Mitral regurgitation: Tr in 10/2019. No CHF.  5.  Bee stings.  WIll give a short course of steroids.  Medrol dose pack. Recommend that she get second COVIDdose at some point.  SHe is going to wait.   Current medicines are reviewed at length with the patient today.  The patient concerns regarding her medicines were addressed.  The following changes have been made:  Medrol dose pack  Labs/ tests ordered today include:  No orders of the defined types were placed in this encounter.   Recommend 150 minutes/week of aerobic exercise Low fat, low carb, high fiber diet recommended  Disposition:   FU in 9 months   Signed, Larae Grooms, MD  07/20/2020 10:12 AM    [facility], [address], [place]  [address] Phone: [phone]; Fax: [phone]

## 2020-07-22 ENCOUNTER — Ambulatory Visit: Payer: PPO | Admitting: Interventional Cardiology

## 2020-07-22 ENCOUNTER — Other Ambulatory Visit: Payer: Self-pay

## 2020-07-22 ENCOUNTER — Encounter: Payer: Self-pay | Admitting: Interventional Cardiology

## 2020-07-22 VITALS — BP 136/80 | HR 79 | Ht 64.75 in | Wt 144.0 lb

## 2020-07-22 DIAGNOSIS — I10 Essential (primary) hypertension: Secondary | ICD-10-CM | POA: Diagnosis not present

## 2020-07-22 DIAGNOSIS — E785 Hyperlipidemia, unspecified: Secondary | ICD-10-CM

## 2020-07-22 DIAGNOSIS — I2510 Atherosclerotic heart disease of native coronary artery without angina pectoris: Secondary | ICD-10-CM | POA: Diagnosis not present

## 2020-07-22 MED ORDER — METHYLPREDNISOLONE 4 MG PO TBPK: ORAL_TABLET | ORAL | 0 refills | Status: DC

## 2020-07-22 NOTE — Patient Instructions (Signed)
Medication Instructions:  Your physician has recommended you make the following change in your medication:   START: methylprednisolone (medrol dose pak): Take 6 tablets on day 1, 5 tablets on day 2, 4 tablets on day 3, 3 tablets on day 4, 2 tablets on day 5, 1 tablet on day 6  *If you need a refill on your cardiac medications before your next appointment, please call your pharmacy*   Lab Work: None  If you have labs (blood work) drawn today and your tests are completely normal, you will receive your results only by: [name] MyChart Message (if you have MyChart) OR . A paper copy in the mail If you have any lab test that is abnormal or we need to change your treatment, we will call you to review the results.   Testing/Procedures: None   Follow-Up: At [facility], you and your health needs are our priority.  As part of our continuing mission to provide you with exceptional heart care, we have created designated Provider Care Teams.  These Care Teams include your primary Cardiologist (physician) and Advanced Practice Providers (APPs -  Physician Assistants and Nurse Practitioners) who all work together to provide you with the care you need, when you need it.  We recommend signing up for the patient portal called "MyChart".  Sign up information is provided on this After Visit Summary.  MyChart is used to connect with patients for Virtual Visits (Telemedicine).  Patients are able to view lab/test results, encounter notes, upcoming appointments, etc.  Non-urgent messages can be sent to your provider as well.   To learn more about what you can do with MyChart, go to [email or web address].    Your next appointment:   9 month(s)  The format for your next appointment:   In Person  Provider:   You may see Larae Grooms, MD or one of the following Advanced Practice Providers on your designated Care Team:    Melina Copa, PA-C  Ermalinda Barrios, PA-C    Other Instructions None

## 2020-07-23 ENCOUNTER — Ambulatory Visit: Payer: PPO

## 2020-08-12 ENCOUNTER — Other Ambulatory Visit: Payer: Self-pay | Admitting: Interventional Cardiology

## 2020-08-26 ENCOUNTER — Telehealth: Payer: Self-pay | Admitting: Family Medicine

## 2020-08-26 NOTE — Telephone Encounter (Signed)
pt need a copy of her Vit D report from [place] from  09/25/2018  that's in her chart; pt asked if the nurse can call and give her the levels that would be fine    [phone]

## 2020-08-26 NOTE — Telephone Encounter (Signed)
I called and spoke with pt. We went over her result. She verbalized understanding.

## 2020-09-08 ENCOUNTER — Other Ambulatory Visit: Payer: Self-pay

## 2020-09-09 ENCOUNTER — Encounter: Payer: Self-pay | Admitting: Family Medicine

## 2020-09-09 ENCOUNTER — Ambulatory Visit (INDEPENDENT_AMBULATORY_CARE_PROVIDER_SITE_OTHER): Payer: PPO | Admitting: Family Medicine

## 2020-09-09 VITALS — BP 126/80 | HR 91 | Resp 16 | Ht 64.75 in | Wt 144.2 lb

## 2020-09-09 DIAGNOSIS — I119 Hypertensive heart disease without heart failure: Secondary | ICD-10-CM | POA: Diagnosis not present

## 2020-09-09 DIAGNOSIS — R7303 Prediabetes: Secondary | ICD-10-CM | POA: Diagnosis not present

## 2020-09-09 DIAGNOSIS — E559 Vitamin D deficiency, unspecified: Secondary | ICD-10-CM

## 2020-09-09 DIAGNOSIS — I2510 Atherosclerotic heart disease of native coronary artery without angina pectoris: Secondary | ICD-10-CM

## 2020-09-09 NOTE — Progress Notes (Signed)
HPI: Erika Brown is a 79 y.o. female, who is here today for 6 months follow up.   She was last seen on 03/07/20.  Vit D deficiency: She has been on Vit D3 for the past a couple or weeks. She was on Ergocalciferol 50,000 U.  Last 25 OH vit D in 02/2020 was 52.9.  She had a bad reaction with the first covid vaccine. She does not want to complete vaccination.  She had COVID 19 infection in 11/2019. Positive COVID 19 ab, 47.38 on 03/12/20.  She also wonders about DEXA, she does not want to add meds. She takes Ca++ supplementation. No recent falls.  Prediabetes: Negative for polydipsia,polyuria, or polyphagia.  Lab Results  Component Value Date   HGBA1C 6.1 (H) 12/21/2019   HTN: Negative for severe/frequent headache, visual changes, chest pain, dyspnea, palpitation, focal weakness, or edema.  Lab Results  Component Value Date   CREATININE 0.65 12/24/2019   BUN 26 (H) 12/24/2019   NA 136 12/24/2019   K 3.8 12/24/2019   CL 102 12/24/2019   CO2 25 12/24/2019   CAD: She has not tolerated statin meds well. Follows with Dr Irish Lack.  Lab Results  Component Value Date   CHOL 215 (H) 03/12/2020   HDL 72.20 03/12/2020   LDLCALC 128 (H) 03/12/2020   LDLDIRECT 121.7 09/08/2007   TRIG 78.0 03/12/2020   CHOLHDL 3 03/12/2020  She is on Zetia 10 mg daily.  Review of Systems  Constitutional: Negative for activity change, appetite change, fatigue and fever.  HENT: Negative for mouth sores, nosebleeds and sore throat.   Respiratory: Negative for cough and wheezing.   Gastrointestinal: Negative for abdominal pain, nausea and vomiting.       Negative for changes in bowel habits.  Genitourinary: Negative for decreased urine volume and hematuria.  Musculoskeletal: Negative for gait problem and myalgias.  Skin: Negative for pallor and rash.  Neurological: Negative for syncope, facial asymmetry and weakness.  Psychiatric/Behavioral: Negative for confusion. The patient is not  nervous/anxious.   Rest of ROS, see pertinent positives sand negatives in HPI  Current Outpatient Medications on File Prior to Visit  Medication Sig Dispense Refill  . Cholecalciferol (VITAMIN D3) 125 MCG (5000 UT) CAPS Take 5,000 Units by mouth daily.    [name] amLODipine (NORVASC) 5 MG tablet TAKE 1 TABLET ONCE DAILY. 90 tablet 3  . Ascorbic Acid (VITAMIN C WITH ROSE HIPS) 500 MG tablet Take 500 mg by mouth daily.    [name] aspirin 81 MG EC tablet Take 81 mg by mouth daily.      . Biotin 10000 MCG TABS Take by mouth.    . clopidogrel (PLAVIX) 75 MG tablet TAKE 1 TABLET ONCE DAILY. 90 tablet 3  . ezetimibe (ZETIA) 10 MG tablet Take 10 mg by mouth daily.    . irbesartan-hydrochlorothiazide (AVALIDE) 300-12.5 MG tablet TAKE 1 TABLET ONCE DAILY. 90 tablet 1  . methylPREDNISolone (MEDROL DOSEPAK) 4 MG TBPK tablet Take 6 tablets on day 1, 5 tablets on day 2, 4 tablets on day 3, 3 tablets on day 4, 2 tablets on day 5, 1 tablet on day 6 21 tablet 0  . metoprolol succinate (TOPROL-XL) 50 MG 24 hr tablet Take 50 mg by mouth daily. Take with or immediately following a meal.    . nitroGLYCERIN (NITROSTAT) 0.4 MG SL tablet DISSOLVE 1 TABLET UNDER TONGUE EVERY 5 MINUTES AS NEEDED FOR CHEST PAIN. 25 tablet 0  . potassium chloride SA (KLOR-CON) 20  MEQ tablet TAKE 1 TABLET ONCE DAILY. 90 tablet 3  . zinc gluconate 50 MG tablet Take 50 mg by mouth daily.    . Zinc Sulfate (ZINC 15 PO) Take by mouth.     No current facility-administered medications on file prior to visit.   Past Medical History:  Diagnosis Date  . Anxiety and depression   . CAD (coronary artery disease)    S/P NSTEMI 4/06 EF 60%; Treated with Cypher DES to LAD and RCA with kissing balloon PCI diagonal; Treadmill myoview 10/09 for CP: Walked 7:06. mild ST-T-wave changes in recovery. EF 71%. Very mild reversible defect in base of the inferior wall --> medical rx.  . Cancer ([place])    HISTORY OF SKIN CANCER  . Cataract   . Complication of anesthesia     " DIFFICULT TO WAKE UP "  . Depression   . Glucose intolerance (impaired glucose tolerance)   . History of echocardiogram    Echo 11/17: Vigorous LVF, EF 65-70, normal wall motion, borderline diastolic function, systolic bowing of mitral valve without prolapse, mild MR, trivial TR, PASP 20  . History of medication noncompliance   . Hyperlipidemia    Intolerate to multiple statins due to severe myalgias  . Hypertension    Possible white-coat component  . MI (myocardial infarction) ([place])    acute-s/p stent  . Microhematuria   . Psoriasis   . Rapid heart beat   . Vitamin D deficiency    Allergies  Allergen Reactions  . Iodine Other (See Comments)    Has had some SHOB and rapid heart rate in the past  . Latex Other (See Comments)    discomfort  . Other Other (See Comments)    Some foods b/c of preservatives   . Statins Other (See Comments)    Intolerance to high dosages, myalgia    Social History   Socioeconomic History  . Marital status: Divorced    Spouse name: Not on file  . Number of children: 7  . Years of education: Not on file  . Highest education level: Not on file  Occupational History  . Occupation: Works at home, Charity fundraiser, [place] Use  . Smoking status: Former Research scientist (life sciences)  . Smokeless tobacco: Never Used  . Tobacco comment: only a few times  Vaping Use  . Vaping Use: Never used  Substance and Sexual Activity  . Alcohol use: Yes    Alcohol/week: 4.0 standard drinks    Types: 4 Glasses of wine per week  . Drug use: No  . Sexual activity: Not Currently    Birth control/protection: Abstinence, Surgical    Comment: [facility]  Other Topics Concern  . Not on file  Social History Narrative   Lives in [place] with her husband   Seven kids   Plays tennis   Very good card player   Social Determinants of Health   Financial Resource Strain:   . Difficulty of Paying Living Expenses: Not on file  Food Insecurity:   . Worried About Ship broker in the Last Year: Not on file  . Ran Out of Food in the Last Year: Not on file  Transportation Needs:   . Lack of Transportation (Medical): Not on file  . Lack of Transportation (Non-Medical): Not on file  Physical Activity:   . Days of Exercise per Week: Not on file  . Minutes of Exercise per Session: Not on file  Stress:   . Feeling of Stress :  Not on file  Social Connections:   . Frequency of Communication with Friends and Family: Not on file  . Frequency of Social Gatherings with Friends and Family: Not on file  . Attends Religious Services: Not on file  . Active Member of Clubs or Organizations: Not on file  . Attends Archivist Meetings: Not on file  . Marital Status: Not on file   Vitals:   09/09/20 0802  BP: 126/80  Pulse: 91  Resp: 16  SpO2: 97%   Body mass index is 24.19 kg/m.  Physical Exam Vitals and nursing note reviewed.  Constitutional:      General: She is not in acute distress.    Appearance: She is well-developed and normal weight.  HENT:     Head: Normocephalic and atraumatic.     Mouth/Throat:     Mouth: Mucous membranes are moist.     Pharynx: Oropharynx is clear.  Eyes:     Conjunctiva/sclera: Conjunctivae normal.  Cardiovascular:     Rate and Rhythm: Normal rate and regular rhythm.     Pulses:          Dorsalis pedis pulses are 2+ on the right side and 2+ on the left side.     Heart sounds: No murmur heard.   Pulmonary:     Effort: Pulmonary effort is normal. No respiratory distress.     Breath sounds: Normal breath sounds.  Abdominal:     Palpations: Abdomen is soft. There is no hepatomegaly or mass.     Tenderness: There is no abdominal tenderness.  Lymphadenopathy:     Cervical: No cervical adenopathy.  Skin:    General: Skin is warm.     Findings: No erythema or rash.  Neurological:     Mental Status: She is alert and oriented to person, place, and time.     Cranial Nerves: No cranial nerve deficit.     Gait:  Gait normal.  Psychiatric:        Mood and Affect: Mood and affect normal.     Comments: Well groomed, good eye contact.   ASSESSMENT AND PLAN:  Ms. JACLIN FINKS was seen today for 6 months follow-up.  Orders Placed This Encounter  Procedures  . BASIC METABOLIC PANEL WITH GFR  . VITAMIN D 25 Hydroxy (Vit-D Deficiency, Fractures)  . Hemoglobin A1c   Lab Results  Component Value Date   CREATININE 0.75 09/09/2020   BUN 18 09/09/2020   NA 140 09/09/2020   K 3.7 09/09/2020   CL 103 09/09/2020   CO2 30 09/09/2020   Lab Results  Component Value Date   HGBA1C 5.8 (H) 09/09/2020   Prediabetes Health lifestyle for primary prevention. Further recommendations according to HgA1C result.  Hypertension with heart disease BP adequately controlled. Continue irbesartan-HCTZ 300-12.5 mg,amlodipine 5 mg,and Metoprolol succinate 50 mg daily. Low salt diet.  Coronary artery disease involving native coronary artery of native heart without angina pectoris Asymptomatic. LDL is not at goal. PCSK-9 inhibitors is something that can be considered. Continue Zetia 10 mg daily,plavix 75 mg daily,and Metoprolol succinate. Following with cardiologist.  Vitamin D deficiency, unspecified Continue vit D3 5000 U daily. Further recommendations according to 25 OH vit D results.  Fall prevention. Ca++ supplementation through her diet, 1000-1200 mg daily.  In regard to COVID 19 vaccination, I think it is appropriate not to receive her 2nd dose, had side effects with first one, s/p COVID 19 in infection with positive immunity.   Return in  about 6 months (around 03/10/2021).   Marzell Allemand G. [place], MD  [facility]. [place] office.   A few things to remember from today's visit:   Prediabetes - Plan: BASIC METABOLIC PANEL WITH GFR, Hemoglobin A1c  Hypertension with heart disease - Plan: BASIC METABOLIC PANEL WITH GFR  Coronary artery disease involving native coronary artery of native  heart without angina pectoris  Vitamin D deficiency, unspecified - Plan: VITAMIN D 25 Hydroxy (Vit-D Deficiency, Fractures)  No changes today. Continue Vit D3 5000 U daily.  If you need refills please call your pharmacy. Do not use My Chart to request refills or for acute issues that need immediate attention.   Please be sure medication list is accurate. If a new problem present, please set up appointment sooner than planned today.

## 2020-09-09 NOTE — Patient Instructions (Addendum)
A few things to remember from today's visit:   Prediabetes - Plan: BASIC METABOLIC PANEL WITH GFR, Hemoglobin A1c  Hypertension with heart disease - Plan: BASIC METABOLIC PANEL WITH GFR  Coronary artery disease involving native coronary artery of native heart without angina pectoris  Vitamin D deficiency, unspecified - Plan: VITAMIN D 25 Hydroxy (Vit-D Deficiency, Fractures)  No changes today. Continue Vit D3 5000 U daily.  If you need refills please call your pharmacy. Do not use My Chart to request refills or for acute issues that need immediate attention.   Please be sure medication list is accurate. If a new problem present, please set up appointment sooner than planned today.

## 2020-09-10 LAB — BASIC METABOLIC PANEL WITH GFR
BUN: 18 mg/dL (ref 7–25)
CO2: 30 mmol/L (ref 20–32)
Calcium: 9.8 mg/dL (ref 8.6–10.4)
Chloride: 103 mmol/L (ref 98–110)
Creat: 0.75 mg/dL (ref 0.60–0.88)
GFR, Est African American: 87 mL/min/{1.73_m2} (ref >=60)
GFR, Est Non African American: 75 mL/min/{1.73_m2} (ref >=60)
Glucose, Bld: 101 mg/dL — ABNORMAL HIGH (ref 65–99)
Potassium: 3.7 mmol/L (ref 3.5–5.3)
Sodium: 140 mmol/L (ref 135–146)

## 2020-09-10 LAB — HEMOGLOBIN A1C
Hgb A1c MFr Bld: 5.8 % of total Hgb — ABNORMAL HIGH (ref <5.7)
Mean Plasma Glucose: 120 (calc)
eAG (mmol/L): 6.6 (calc)

## 2020-09-10 LAB — VITAMIN D 25 HYDROXY (VIT D DEFICIENCY, FRACTURES): Vit D, 25-Hydroxy: 73 ng/mL (ref 30–100)

## 2020-09-18 DIAGNOSIS — H11821 Conjunctivochalasis, right eye: Secondary | ICD-10-CM | POA: Diagnosis not present

## 2020-09-18 DIAGNOSIS — Z961 Presence of intraocular lens: Secondary | ICD-10-CM | POA: Diagnosis not present

## 2020-09-18 DIAGNOSIS — H04123 Dry eye syndrome of bilateral lacrimal glands: Secondary | ICD-10-CM | POA: Diagnosis not present

## 2020-09-18 DIAGNOSIS — H353131 Nonexudative age-related macular degeneration, bilateral, early dry stage: Secondary | ICD-10-CM | POA: Diagnosis not present

## 2020-09-18 DIAGNOSIS — H5711 Ocular pain, right eye: Secondary | ICD-10-CM | POA: Diagnosis not present

## 2020-09-18 DIAGNOSIS — H1045 Other chronic allergic conjunctivitis: Secondary | ICD-10-CM | POA: Diagnosis not present

## 2020-10-16 DIAGNOSIS — D1801 Hemangioma of skin and subcutaneous tissue: Secondary | ICD-10-CM | POA: Diagnosis not present

## 2020-10-16 DIAGNOSIS — L570 Actinic keratosis: Secondary | ICD-10-CM | POA: Diagnosis not present

## 2020-10-16 DIAGNOSIS — D225 Melanocytic nevi of trunk: Secondary | ICD-10-CM | POA: Diagnosis not present

## 2020-10-16 DIAGNOSIS — D2261 Melanocytic nevi of right upper limb, including shoulder: Secondary | ICD-10-CM | POA: Diagnosis not present

## 2020-10-16 DIAGNOSIS — Z85828 Personal history of other malignant neoplasm of skin: Secondary | ICD-10-CM | POA: Diagnosis not present

## 2020-10-16 DIAGNOSIS — D2271 Melanocytic nevi of right lower limb, including hip: Secondary | ICD-10-CM | POA: Diagnosis not present

## 2020-10-16 DIAGNOSIS — D2272 Melanocytic nevi of left lower limb, including hip: Secondary | ICD-10-CM | POA: Diagnosis not present

## 2020-10-16 DIAGNOSIS — L821 Other seborrheic keratosis: Secondary | ICD-10-CM | POA: Diagnosis not present

## 2020-11-24 ENCOUNTER — Telehealth: Payer: Self-pay | Admitting: Family Medicine

## 2020-11-24 NOTE — Telephone Encounter (Signed)
Spoke with pt today.  She stated her daughter took a home covid test 11/24/20  With positive results.  She stated she was with her daughter all weekend.  She stated that they had neg results 11/21/20.  Pt is wanting to know what she needs to do  Pt stated she had a little headache last night.  But to she feels good today.   Pt has cleaning person coming in Wed it this ok or  Should she reschedule.   Please advise

## 2020-11-24 NOTE — Telephone Encounter (Signed)
Does she need to be tested again or reschedule the cleaning?

## 2020-11-25 NOTE — Telephone Encounter (Signed)
I left pt a voicemail with the info below & advised to call back with any questions.

## 2020-11-25 NOTE — Telephone Encounter (Signed)
If she is not symptomatic and had a negative COVID 19 negative I think it is ok to have the lady coming to clean her house. Social distance and contact precautions are always advised regardless. Thanks, BJ

## 2020-12-11 ENCOUNTER — Ambulatory Visit (INDEPENDENT_AMBULATORY_CARE_PROVIDER_SITE_OTHER): Payer: Medicare HMO

## 2020-12-11 VITALS — Wt 142.0 lb

## 2020-12-11 DIAGNOSIS — Z0000 Encounter for general adult medical examination without abnormal findings: Secondary | ICD-10-CM | POA: Diagnosis not present

## 2020-12-11 NOTE — Progress Notes (Signed)
Subjective:   Erika Brown is a 80 y.o. female who presents for an Initial Medicare Annual Wellness Visit.  Virtual Visit via Video Note  I connected with Erika Brown on 12/11/20 at  9:00 AM EST by a video enabled telemedicine application and verified that I am speaking with the correct person using two identifiers.  Location: Patient: Home  Provider: Office    I discussed the limitations of evaluation and management by telemedicine and the availability of in person appointments. The patient expressed understanding and agreed to proceed.     Theodora Blow, LPN    Review of Systems    N/A  Cardiac Risk Factors include: advanced age (>54men, >38 women);hypertension;dyslipidemia     Objective:    Today's Vitals   12/11/20 0936  Weight: 142 lb (64.4 kg)   Body mass index is 23.81 kg/m.  Advanced Directives 12/11/2020 12/21/2019 12/20/2019 03/30/2018 03/30/2018 02/28/2018 02/21/2018  Does Patient Have a Medical Advance Directive? Yes Yes Yes Yes Yes Yes No  Type of Estate agent of [place];Living will Out of facility DNR (pink MOST or yellow form) Out of facility DNR (pink MOST or yellow form) Healthcare Power of [place];Living will Healthcare Power of [place];Living will Healthcare Power of [place];Living will -  Does patient want to make changes to medical advance directive? No - Patient declined No - Patient declined No - Patient declined - - No - Patient declined -  Copy of Healthcare Power of Attorney in Chart? No - copy requested - - - - No - copy requested -  Would patient like information on creating a medical advance directive? - - No - Patient declined - - No - Patient declined -    Current Medications (verified) Outpatient Encounter Medications as of 12/11/2020  Medication Sig  . amLODipine (NORVASC) 5 MG tablet TAKE 1 TABLET ONCE DAILY.  [name] Ascorbic Acid (VITAMIN C WITH ROSE HIPS) 500 MG tablet Take 500 mg by mouth daily.  [name] aspirin 81 MG  EC tablet Take 81 mg by mouth daily.  . Biotin 27741 MCG TABS Take by mouth.  . Cholecalciferol (VITAMIN D3) 125 MCG (5000 UT) CAPS Take 5,000 Units by mouth daily.  . clopidogrel (PLAVIX) 75 MG tablet TAKE 1 TABLET ONCE DAILY.  [name] ezetimibe (ZETIA) 10 MG tablet Take 10 mg by mouth daily.  . irbesartan-hydrochlorothiazide (AVALIDE) 300-12.5 MG tablet TAKE 1 TABLET ONCE DAILY.  . metoprolol succinate (TOPROL-XL) 50 MG 24 hr tablet Take 50 mg by mouth daily. Take with or immediately following a meal.  . potassium chloride SA (KLOR-CON) 20 MEQ tablet TAKE 1 TABLET ONCE DAILY.  [name] zinc gluconate 50 MG tablet Take 50 mg by mouth daily.  . nitroGLYCERIN (NITROSTAT) 0.4 MG SL tablet DISSOLVE 1 TABLET UNDER TONGUE EVERY 5 MINUTES AS NEEDED FOR CHEST PAIN. (Patient not taking: Reported on 12/11/2020)  . [DISCONTINUED] methylPREDNISolone (MEDROL DOSEPAK) 4 MG TBPK tablet Take 6 tablets on day 1, 5 tablets on day 2, 4 tablets on day 3, 3 tablets on day 4, 2 tablets on day 5, 1 tablet on day 6  . [DISCONTINUED] Zinc Sulfate (ZINC 15 PO) Take by mouth.   No facility-administered encounter medications on file as of 12/11/2020.    Allergies (verified) Iodine, Latex, Other, and Statins   History: Past Medical History:  Diagnosis Date  . Anxiety and depression   . CAD (coronary artery disease)    S/P NSTEMI 4/06 EF 60%; Treated with Cypher DES to  LAD and RCA with kissing balloon PCI diagonal; Treadmill myoview 10/09 for CP: Walked 7:06. mild ST-T-wave changes in recovery. EF 71%. Very mild reversible defect in base of the inferior wall --> medical rx.  . Cancer ([place])    HISTORY OF SKIN CANCER  . Cataract   . Complication of anesthesia    " DIFFICULT TO WAKE UP "  . Depression   . Glucose intolerance (impaired glucose tolerance)   . History of echocardiogram    Echo 11/17: Vigorous LVF, EF 65-70, normal wall motion, borderline diastolic function, systolic bowing of mitral valve without prolapse, mild MR,  trivial TR, PASP 20  . History of medication noncompliance   . Hyperlipidemia    Intolerate to multiple statins due to severe myalgias  . Hypertension    Possible white-coat component  . MI (myocardial infarction) ([place])    acute-s/p stent  . Microhematuria   . Psoriasis   . Rapid heart beat   . Vitamin D deficiency    Past Surgical History:  Procedure Laterality Date  . ABDOMINAL HYSTERECTOMY     and anterior repair  . APPENDECTOMY    . bladder tact    . BREAST BIOPSY    . CARDIAC CATHETERIZATION  02/28/2018  . CATARACT EXTRACTION Bilateral 2017   Dr. Katy Fitch  . CORONARY STENT INTERVENTION N/A 02/28/2018   Procedure: CORONARY STENT INTERVENTION;  Surgeon: Jettie Booze, MD;  Location: [facility];  Service: Cardiovascular;  Laterality: N/A;  . CORONARY STENT PLACEMENT    . CORONARY STENT PLACEMENT  04/02/019  . LEFT HEART CATH AND CORONARY ANGIOGRAPHY N/A 02/28/2018   Procedure: LEFT HEART CATH AND CORONARY ANGIOGRAPHY;  Surgeon: Jettie Booze, MD;  Location: [facility];  Service: Cardiovascular;  Laterality: N/A;  . MOHS SURGERY  9/14   [facility].  [facility]  . TONSILLECTOMY AND ADENOIDECTOMY    . ULTRASOUND GUIDANCE FOR VASCULAR ACCESS  02/28/2018   Procedure: Ultrasound Guidance For Vascular Access;  Surgeon: Jettie Booze, MD;  Location: [facility];  Service: Cardiovascular;;   Family History  Problem Relation Age of Onset  . Heart disease Father   . Heart attack Father   . Ulcers Father   . Hypertension Mother   . Melanoma Mother   . Cancer Daughter        melanoma  . Breast cancer Daughter   . Stroke Maternal Grandmother   . Hypertension Maternal Grandmother   . Stroke Maternal Grandfather   . Renal Disease Maternal Grandfather        kidney removed  . Hypertension Maternal Grandfather   . Diabetes Paternal Grandmother   . Melanoma Child        Metastatic  . Breast cancer Sister 62  . Hypertension Brother   . Asthma Brother    . Depression Daughter        x2  . Rectal cancer Son   . Coronary artery disease Neg Hx    Social History   Socioeconomic History  . Marital status: Divorced    Spouse name: Not on file  . Number of children: 7  . Years of education: Not on file  . Highest education level: Not on file  Occupational History  . Occupation: Works at home, Charity fundraiser, [employer] Use  . Smoking status: Former Research scientist (life sciences)  . Smokeless tobacco: Never Used  . Tobacco comment: only a few times  Vaping Use  . Vaping Use: Never used  Substance and Sexual Activity  . Alcohol use: Yes    Alcohol/week: 4.0 standard drinks    Types: 4 Glasses of wine per week  . Drug use: No  . Sexual activity: Not Currently    Birth control/protection: Abstinence, Surgical    Comment: [facility]  Other Topics Concern  . Not on file  Social History Narrative   Lives in [place] with her husband   Seven kids   Plays tennis   Very good card player   Social Determinants of Health   Financial Resource Strain: Low Risk   . Difficulty of Paying Living Expenses: Not hard at all  Food Insecurity: No Food Insecurity  . Worried About Charity fundraiser in the Last Year: Never true  . Ran Out of Food in the Last Year: Never true  Transportation Needs: No Transportation Needs  . Lack of Transportation (Medical): No  . Lack of Transportation (Non-Medical): No  Physical Activity: Inactive  . Days of Exercise per Week: 0 days  . Minutes of Exercise per Session: 0 min  Stress: No Stress Concern Present  . Feeling of Stress : Not at all  Social Connections: Moderately Integrated  . Frequency of Communication with Friends and Family: More than three times a week  . Frequency of Social Gatherings with Friends and Family: More than three times a week  . Attends Religious Services: More than 4 times per year  . Active Member of Clubs or Organizations: Yes  . Attends Archivist Meetings: More than 4 times per  year  . Marital Status: Widowed    Tobacco Counseling Counseling given: Not Answered Comment: only a few times   Clinical Intake:  Pre-visit preparation completed: Yes  Pain : No/denies pain     Nutritional Risks: None Diabetes: No  How often do you need to have someone help you when you read instructions, pamphlets, or other written materials from your doctor or pharmacy?: 1 - Never What is the last grade level you completed in school?: College  Diabetic?No   Interpreter Needed?: No  Information entered by :: [place] of Daily Living In your present state of health, do you have any difficulty performing the following activities: 12/11/2020 12/21/2019  Hearing? N Y  Vision? N Y  Difficulty concentrating or making decisions? N Y  Walking or climbing stairs? N N  Dressing or bathing? N N  Doing errands, shopping? N N  Preparing Food and eating ? N -  Using the Toilet? N -  In the past six months, have you accidently leaked urine? N -  Do you have problems with loss of bowel control? N -  Managing your Medications? N -  Managing your Finances? N -  Housekeeping or managing your Housekeeping? N -  Some recent data might be hidden    Patient Care Team: Martinique, Betty G, MD as PCP - General (Family Medicine) Jettie Booze, MD as PCP - Cardiology (Cardiology)  Indicate any recent Medical Services you may have received from other than [facility] providers in the past year (date may be approximate).     Assessment:   This is a routine wellness examination for Erika Brown.  Hearing/Vision screen  Hearing Screening   125Hz  250Hz  500Hz  1000Hz  2000Hz  3000Hz  4000Hz  6000Hz  8000Hz   Right ear:           Left ear:           Vision Screening Comments: Patient states gets eyes examined once  per year   Dietary issues and exercise activities discussed: Current Exercise Habits: The patient has a physically strenuous job, but has no regular exercise apart from  work.  Goals    . Exercise 150 min/wk Moderate Activity      Depression Screen PHQ 2/9 Scores 12/11/2020 09/09/2020 08/17/2018 06/12/2015  PHQ - 2 Score 0 0 0 0    Fall Risk Fall Risk  12/11/2020 08/17/2018  Falls in the past year? 0 No  Number falls in past yr: 0 -  Injury with Fall? 0 -  Risk for fall due to : No Fall Risks -  Follow up Falls evaluation completed;Falls prevention discussed -    FALL RISK PREVENTION PERTAINING TO THE HOME:  Any stairs in or around the home? Yes  If so, are there any without handrails? No  Home free of loose throw rugs in walkways, pet beds, electrical cords, etc? Yes  Adequate lighting in your home to reduce risk of falls? Yes   ASSISTIVE DEVICES UTILIZED TO PREVENT FALLS:  Life alert? Yes  Use of a cane, walker or w/c? No  Grab bars in the bathroom? Yes  Shower chair or bench in shower? No  Elevated toilet seat or a handicapped toilet? No     Cognitive Function:   Normal cognitive status assessed by direct observation by this Nurse Health Advisor. No abnormalities found.        Immunizations Immunization History  Administered Date(s) Administered  . PFIZER SARS-COV-2 Vaccination 06/25/2020  . Pneumococcal Conjugate-13 11/10/2015  . Pneumococcal Polysaccharide-23 07/18/2006, 11/07/2019  . Tdap 03/23/2010, 03/24/2020    TDAP status: Up to date  Flu Vaccine status: Declined, Education has been provided regarding the importance of this vaccine but patient still declined. Advised may receive this vaccine at local pharmacy or Health Dept. Aware to provide a copy of the vaccination record if obtained from local pharmacy or Health Dept. Verbalized acceptance and understanding.  Pneumococcal vaccine status: Up to date  Covid-19 vaccine status: Completed vaccines  Qualifies for Shingles Vaccine? Yes   Zostavax completed No   Shingrix Completed?: No.    Education has been provided regarding the importance of this vaccine. Patient has  been advised to call insurance company to determine out of pocket expense if they have not yet received this vaccine. Advised may also receive vaccine at local pharmacy or Health Dept. Verbalized acceptance and understanding.  Screening Tests Health Maintenance  Topic Date Due  . COVID-19 Vaccine (2 - Pfizer 3-dose series) 07/16/2020  . INFLUENZA VACCINE  02/26/2021 (Originally 06/29/2020)  . TETANUS/TDAP  03/24/2030  . PNA vac Low Risk Adult  Completed  . DEXA SCAN  Discontinued    Health Maintenance  Health Maintenance Due  Topic Date Due  . COVID-19 Vaccine (2 - Pfizer 3-dose series) 07/16/2020    Colorectal cancer screening: No longer required.   Mammogram status: No longer required due to age.  Bone Density status: Ordered 12/11/2020. Pt provided with contact info and advised to call to schedule appt.  Lung Cancer Screening: (Low Dose CT Chest recommended if Age 75-80 years, 30 pack-year currently smoking OR have quit w/in 15years.) does not qualify.   Lung Cancer Screening Referral: N/A   Additional Screening:  Hepatitis C Screening: does not qualify;   Vision Screening: Recommended annual ophthalmology exams for early detection of glaucoma and other disorders of the eye. Is the patient up to date with their annual eye exam?  Yes  Who is the provider  or what is the name of the office in which the patient attends annual eye exams? Dr. Clent Jacks  If pt is not established with a provider, would they like to be referred to a provider to establish care? No .   Dental Screening: Recommended annual dental exams for proper oral hygiene  Community Resource Referral / Chronic Care Management: CRR required this visit?  No   CCM required this visit?  No      Plan:     I have personally reviewed and noted the following in the patient's chart:   . Medical and social history . Use of alcohol, tobacco or illicit drugs  . Current medications and supplements . Functional  ability and status . Nutritional status . Physical activity . Advanced directives . List of other physicians . Hospitalizations, surgeries, and ER visits in previous 12 months . Vitals . Screenings to include cognitive, depression, and falls . Referrals and appointments  In addition, I have reviewed and discussed with patient certain preventive protocols, quality metrics, and best practice recommendations. A written personalized care plan for preventive services as well as general preventive health recommendations were provided to patient.     Ofilia Neas, LPN   [id number]   Nurse Notes: None

## 2020-12-11 NOTE — Patient Instructions (Signed)
Erika Brown , Thank you for taking time to come for your Medicare Wellness Visit. I appreciate your ongoing commitment to your health goals. Please review the following plan we discussed and let me know if I can assist you in the future.   Screening recommendations/referrals: Colonoscopy: No longer required  Mammogram: No longer required  Bone Density:  No longer required  Recommended yearly ophthalmology/optometry visit for glaucoma screening and checkup Recommended yearly dental visit for hygiene and checkup  Vaccinations: Influenza vaccine: Patient politely declined  Pneumococcal vaccine: Completed series  Tdap vaccine: Up to date, next due 03/24/2030 Shingles vaccine: currently due for Shingrix, if you wish to receive we recommend that you do so at your local pharmacy as it is less expensive     Advanced directives: Please bring in copies of your advanced medical directives into our office so that we may scan them into your chart.   Conditions/risks identified: None   Next appointment: 03/10/2021 @ 8:00 a with Dr. Martinique    Preventive Care 3 Years and Older, Female Preventive care refers to lifestyle choices and visits with your health care provider that can promote health and wellness. What does preventive care include?  A yearly physical exam. This is also called an annual well check.  Dental exams once or twice a year.  Routine eye exams. Ask your health care provider how often you should have your eyes checked.  Personal lifestyle choices, including:  Daily care of your teeth and gums.  Regular physical activity.  Eating a healthy diet.  Avoiding tobacco and drug use.  Limiting alcohol use.  Practicing safe sex.  Taking low-dose aspirin every day.  Taking vitamin and mineral supplements as recommended by your health care provider. What happens during an annual well check? The services and screenings done by your health care provider during your annual well  check will depend on your age, overall health, lifestyle risk factors, and family history of disease. Counseling  Your health care provider may ask you questions about your:  Alcohol use.  Tobacco use.  Drug use.  Emotional well-being.  Home and relationship well-being.  Sexual activity.  Eating habits.  History of falls.  Memory and ability to understand (cognition).  Work and work Statistician.  Reproductive health. Screening  You may have the following tests or measurements:  Height, weight, and BMI.  Blood pressure.  Lipid and cholesterol levels. These may be checked every 5 years, or more frequently if you are over 68 years old.  Skin check.  Lung cancer screening. You may have this screening every year starting at age 48 if you have a 30-pack-year history of smoking and currently smoke or have quit within the past 15 years.  Fecal occult blood test (FOBT) of the stool. You may have this test every year starting at age 18.  Flexible sigmoidoscopy or colonoscopy. You may have a sigmoidoscopy every 5 years or a colonoscopy every 10 years starting at age 1.  Hepatitis C blood test.  Hepatitis B blood test.  Sexually transmitted disease (STD) testing.  Diabetes screening. This is done by checking your blood sugar (glucose) after you have not eaten for a while (fasting). You may have this done every 1-3 years.  Bone density scan. This is done to screen for osteoporosis. You may have this done starting at age 64.  Mammogram. This may be done every 1-2 years. Talk to your health care provider about how often you should have regular mammograms. Talk with  your health care provider about your test results, treatment options, and if necessary, the need for more tests. Vaccines  Your health care provider may recommend certain vaccines, such as:  Influenza vaccine. This is recommended every year.  Tetanus, diphtheria, and acellular pertussis (Tdap, Td) vaccine. You  may need a Td booster every 10 years.  Zoster vaccine. You may need this after age 62.  Pneumococcal 13-valent conjugate (PCV13) vaccine. One dose is recommended after age 50.  Pneumococcal polysaccharide (PPSV23) vaccine. One dose is recommended after age 25. Talk to your health care provider about which screenings and vaccines you need and how often you need them. This information is not intended to replace advice given to you by your health care provider. Make sure you discuss any questions you have with your health care provider. Document Released: 12/12/2015 Document Revised: 08/04/2016 Document Reviewed: 09/16/2015 Elsevier Interactive Patient Education  2017 [employer] Prevention in the Home Falls can cause injuries. They can happen to people of all ages. There are many things you can do to make your home safe and to help prevent falls. What can I do on the outside of my home?  Regularly fix the edges of walkways and driveways and fix any cracks.  Remove anything that might make you trip as you walk through a door, such as a raised step or threshold.  Trim any bushes or trees on the path to your home.  Use bright outdoor lighting.  Clear any walking paths of anything that might make someone trip, such as rocks or tools.  Regularly check to see if handrails are loose or broken. Make sure that both sides of any steps have handrails.  Any raised decks and porches should have guardrails on the edges.  Have any leaves, snow, or ice cleared regularly.  Use sand or salt on walking paths during winter.  Clean up any spills in your garage right away. This includes oil or grease spills. What can I do in the bathroom?  Use night lights.  Install grab bars by the toilet and in the tub and shower. Do not use towel bars as grab bars.  Use non-skid mats or decals in the tub or shower.  If you need to sit down in the shower, use a plastic, non-slip stool.  Keep the floor  dry. Clean up any water that spills on the floor as soon as it happens.  Remove soap buildup in the tub or shower regularly.  Attach bath mats securely with double-sided non-slip rug tape.  Do not have throw rugs and other things on the floor that can make you trip. What can I do in the bedroom?  Use night lights.  Make sure that you have a light by your bed that is easy to reach.  Do not use any sheets or blankets that are too big for your bed. They should not hang down onto the floor.  Have a firm chair that has side arms. You can use this for support while you get dressed.  Do not have throw rugs and other things on the floor that can make you trip. What can I do in the kitchen?  Clean up any spills right away.  Avoid walking on wet floors.  Keep items that you use a lot in easy-to-reach places.  If you need to reach something above you, use a strong step stool that has a grab bar.  Keep electrical cords out of the way.  Do not  use floor polish or wax that makes floors slippery. If you must use wax, use non-skid floor wax.  Do not have throw rugs and other things on the floor that can make you trip. What can I do with my stairs?  Do not leave any items on the stairs.  Make sure that there are handrails on both sides of the stairs and use them. Fix handrails that are broken or loose. Make sure that handrails are as long as the stairways.  Check any carpeting to make sure that it is firmly attached to the stairs. Fix any carpet that is loose or worn.  Avoid having throw rugs at the top or bottom of the stairs. If you do have throw rugs, attach them to the floor with carpet tape.  Make sure that you have a light switch at the top of the stairs and the bottom of the stairs. If you do not have them, ask someone to add them for you. What else can I do to help prevent falls?  Wear shoes that:  Do not have high heels.  Have rubber bottoms.  Are comfortable and fit you  well.  Are closed at the toe. Do not wear sandals.  If you use a stepladder:  Make sure that it is fully opened. Do not climb a closed stepladder.  Make sure that both sides of the stepladder are locked into place.  Ask someone to hold it for you, if possible.  Clearly mark and make sure that you can see:  Any grab bars or handrails.  First and last steps.  Where the edge of each step is.  Use tools that help you move around (mobility aids) if they are needed. These include:  Canes.  Walkers.  Scooters.  Crutches.  Turn on the lights when you go into a dark area. Replace any light bulbs as soon as they burn out.  Set up your furniture so you have a clear path. Avoid moving your furniture around.  If any of your floors are uneven, fix them.  If there are any pets around you, be aware of where they are.  Review your medicines with your doctor. Some medicines can make you feel dizzy. This can increase your chance of falling. Ask your doctor what other things that you can do to help prevent falls. This information is not intended to replace advice given to you by your health care provider. Make sure you discuss any questions you have with your health care provider. Document Released: 09/11/2009 Document Revised: 04/22/2016 Document Reviewed: 12/20/2014 Elsevier Interactive Patient Education  2017 [employer].

## 2021-01-13 ENCOUNTER — Other Ambulatory Visit: Payer: Self-pay | Admitting: Internal Medicine

## 2021-01-13 ENCOUNTER — Other Ambulatory Visit: Payer: Self-pay | Admitting: Interventional Cardiology

## 2021-01-13 NOTE — Telephone Encounter (Signed)
Pt of Dr. Irish Lack

## 2021-01-27 ENCOUNTER — Other Ambulatory Visit: Payer: Self-pay

## 2021-01-27 ENCOUNTER — Ambulatory Visit (INDEPENDENT_AMBULATORY_CARE_PROVIDER_SITE_OTHER): Payer: Medicare HMO | Admitting: Family Medicine

## 2021-01-27 ENCOUNTER — Encounter: Payer: Self-pay | Admitting: Family Medicine

## 2021-01-27 VITALS — BP 118/70 | HR 66 | Temp 98.2°F | Resp 16 | Ht 64.75 in

## 2021-01-27 DIAGNOSIS — N63 Unspecified lump in unspecified breast: Secondary | ICD-10-CM | POA: Diagnosis not present

## 2021-01-27 DIAGNOSIS — K1379 Other lesions of oral mucosa: Secondary | ICD-10-CM | POA: Diagnosis not present

## 2021-01-27 NOTE — Patient Instructions (Signed)
A few things to remember from today's visit:   Breast mass in female - Plan: MM DIAG BREAST TOMO BILATERAL, US BREAST ASPIRATION LEFT  Lesion of soft palate - Plan: Ambulatory referral to ENT  If you need refills please call your pharmacy. Do not use My Chart to request refills or for acute issues that need immediate attention.   Somebody will be arranging appt with ENT and mammogram.  Please be sure medication list is accurate. If a new problem present, please set up appointment sooner than planned today.

## 2021-01-27 NOTE — Progress Notes (Signed)
Chief Complaint  Patient presents with  . Breast Mass    Found a lump on the left breast, pebble sized.  [name] throat spot    Noted initially 5 months ago.   HPI: Erika Brown is a 80 y.o. female, who is here today with above complaint.  A few days ago she was checking her breast, felt like left one was different, noted a lump in outer area. Hx of dense breast, years ago she underwent left breat Bx. Sister with hx of breast cancer in her mid 34's. Her last mammogram about 20 years ago. She has not noted skin changes,nipple discharge,or axillary pain.  M:14 G:7 L:7 She breast-fed her last child. On OCP's x a year. S/P hysterectomy in 52 (80 yo).  Her daughter and sister with hx of breast cancer.  -She is also concerned about reddish "spot" on right side of soft palate. Initially she had some discomfort, checked again in 10/2020 and lesion has not healed.   Negative for dysphagia,dysphonia,CP,cough, SOB,wheezing,or stridor. Former smoker.  Review of Systems  Constitutional: Negative for appetite change, chills and fatigue.  HENT: Negative for congestion, mouth sores, postnasal drip and rhinorrhea.   Gastrointestinal: Negative for abdominal pain, nausea and vomiting.  Musculoskeletal: Negative for gait problem and myalgias.  Allergic/Immunologic: Negative for environmental allergies.  Neurological: Negative for weakness and headaches.  Hematological: Negative for adenopathy. Does not bruise/bleed easily.  Rest see pertinent positives and negatives per HPI.  Current Outpatient Medications on File Prior to Visit  Medication Sig Dispense Refill  . amLODipine (NORVASC) 5 MG tablet TAKE 1 TABLET ONCE DAILY. 90 tablet 3  . Ascorbic Acid (VITAMIN C WITH ROSE HIPS) 500 MG tablet Take 500 mg by mouth daily.    [name] aspirin 81 MG EC tablet Take 81 mg by mouth daily.    . Biotin 10000 MCG TABS Take by mouth.    . Cholecalciferol (VITAMIN D3) 125 MCG (5000 UT) CAPS Take 5,000  Units by mouth daily.    . clopidogrel (PLAVIX) 75 MG tablet TAKE 1 TABLET ONCE DAILY. 90 tablet 3  . ezetimibe (ZETIA) 10 MG tablet Take 10 mg by mouth daily.    . irbesartan-hydrochlorothiazide (AVALIDE) 300-12.5 MG tablet Take 1 tablet by mouth daily. Please call and schedule follow up appointment. 90 tablet 0  . metoprolol succinate (TOPROL-XL) 50 MG 24 hr tablet TAKE 1 TABLET ONCE DAILY. 90 tablet 3  . nitroGLYCERIN (NITROSTAT) 0.4 MG SL tablet DISSOLVE 1 TABLET UNDER TONGUE EVERY 5 MINUTES AS NEEDED FOR CHEST PAIN. 25 tablet 0  . potassium chloride SA (KLOR-CON) 20 MEQ tablet TAKE 1 TABLET ONCE DAILY. 90 tablet 3  . zinc gluconate 50 MG tablet Take 50 mg by mouth daily.     No current facility-administered medications on file prior to visit.   Past Medical History:  Diagnosis Date  . Anxiety and depression   . CAD (coronary artery disease)    S/P NSTEMI 4/06 EF 60%; Treated with Cypher DES to LAD and RCA with kissing balloon PCI diagonal; Treadmill myoview 10/09 for CP: Walked 7:06. mild ST-T-wave changes in recovery. EF 71%. Very mild reversible defect in base of the inferior wall --> medical rx.  . Cancer ([place])    HISTORY OF SKIN CANCER  . Cataract   . Complication of anesthesia    " DIFFICULT TO WAKE UP "  . Depression   . Glucose intolerance (impaired glucose tolerance)   . History of echocardiogram  Echo 11/17: Vigorous LVF, EF 65-70, normal wall motion, borderline diastolic function, systolic bowing of mitral valve without prolapse, mild MR, trivial TR, PASP 20  . History of medication noncompliance   . Hyperlipidemia    Intolerate to multiple statins due to severe myalgias  . Hypertension    Possible white-coat component  . MI (myocardial infarction) ([place])    acute-s/p stent  . Microhematuria   . Psoriasis   . Rapid heart beat   . Vitamin D deficiency    Allergies  Allergen Reactions  . Iodine Other (See Comments)    Has had some SHOB and rapid heart rate in the  past  . Latex Other (See Comments)    discomfort  . Other Other (See Comments)    Some foods b/c of preservatives   . Statins Other (See Comments)    Intolerance to high dosages, myalgia    Social History   Socioeconomic History  . Marital status: Divorced    Spouse name: Not on file  . Number of children: 7  . Years of education: Not on file  . Highest education level: Not on file  Occupational History  . Occupation: Works at home, Charity fundraiser, [employer] Use  . Smoking status: Former Research scientist (life sciences)  . Smokeless tobacco: Never Used  . Tobacco comment: only a few times  Vaping Use  . Vaping Use: Never used  Substance and Sexual Activity  . Alcohol use: Yes    Alcohol/week: 4.0 standard drinks    Types: 4 Glasses of wine per week  . Drug use: No  . Sexual activity: Not Currently    Birth control/protection: Abstinence, Surgical    Comment: TVH  Other Topics Concern  . Not on file  Social History Narrative   Lives in [place] with her husband   Seven kids   Plays tennis   Very good card player   Social Determinants of Health   Financial Resource Strain: Low Risk   . Difficulty of Paying Living Expenses: Not hard at all  Food Insecurity: No Food Insecurity  . Worried About Charity fundraiser in the Last Year: Never true  . Ran Out of Food in the Last Year: Never true  Transportation Needs: No Transportation Needs  . Lack of Transportation (Medical): No  . Lack of Transportation (Non-Medical): No  Physical Activity: Inactive  . Days of Exercise per Week: 0 days  . Minutes of Exercise per Session: 0 min  Stress: No Stress Concern Present  . Feeling of Stress : Not at all  Social Connections: Moderately Integrated  . Frequency of Communication with Friends and Family: More than three times a week  . Frequency of Social Gatherings with Friends and Family: More than three times a week  . Attends Religious Services: More than 4 times per year  . Active  Member of Clubs or Organizations: Yes  . Attends Archivist Meetings: More than 4 times per year  . Marital Status: Widowed    Vitals:   01/27/21 1611  BP: 118/70  Pulse: 66  Resp: 16  Temp: 98.2 F (36.8 C)  SpO2: 99%   Body mass index is 23.81 kg/m.  Physical Exam Vitals and nursing note reviewed.  Constitutional:      General: She is not in acute distress.    Appearance: She is well-developed.  HENT:     Head: Normocephalic and atraumatic.     Mouth/Throat:     Mouth: Oropharynx is clear  and moist and mucous membranes are normal. Mucous membranes are moist.     Tongue: No lesions.     Palate: Lesions present.     Pharynx: Uvula midline. No posterior oropharyngeal erythema or uvula swelling.      Comments: Right anterior pillar: Oval erythematous,borders seem mildly raised lesion. I am not able to appreciate telangiectasis. I do not notice ulcers. Eyes:     Conjunctiva/sclera: Conjunctivae normal.  Cardiovascular:     Rate and Rhythm: Normal rate and regular rhythm.     Heart sounds: No murmur heard.   Pulmonary:     Effort: Pulmonary effort is normal. No respiratory distress.     Breath sounds: Normal breath sounds.  Chest:  Breasts:     Right: No axillary adenopathy or supraclavicular adenopathy.     Left: No axillary adenopathy or supraclavicular adenopathy.     Abdominal:     Palpations: Abdomen is soft. There is no mass.     Tenderness: There is no abdominal tenderness.  Genitourinary:    Comments: Breast: Dense breast, fibrocystic like changes, mainly outer quadrants bilateral. Left breast with area of concern at 3 O'clock,tender, nodular area with main lesion about 2 cm. No nipple discharge or skin changes. Musculoskeletal:        General: No edema.  Lymphadenopathy:     Head:     Right side of head: No submandibular adenopathy.     Left side of head: No submandibular adenopathy.     Cervical: No cervical adenopathy.     Upper Body:      Right upper body: No supraclavicular or axillary adenopathy.     Left upper body: No supraclavicular or axillary adenopathy.  Skin:    General: Skin is warm.     Findings: No erythema or rash.  Neurological:     Mental Status: She is alert and oriented to person, place, and time.     Cranial Nerves: No cranial nerve deficit.     Gait: Gait normal.     Deep Tendon Reflexes: Strength normal.  Psychiatric:        Mood and Affect: Mood and affect normal.     Comments: Well groomed, good eye contact.   ASSESSMENT AND PLAN:  Erika Brown was seen today for breast mass and throat spot.  Diagnoses and all orders for this visit: Orders Placed This Encounter  Procedures  . MM DIAG BREAST TOMO BILATERAL  . US BREAST ASPIRATION LEFT  . Ambulatory referral to ENT   Breast mass in female We discussed possible etiologies, including fibrocystic breast disease. Dx mammogram and [place] will be arranged.  Lesion of soft palate Lesion has noted healed. I think it is appropriate to have ENT evaluation and decide if lesion needs to be biopsied.  Spent 36 minutes.  During this time history was obtained and documented, examination was performed, prior imaging report reviewed, and assessment/plan discussed.  Return if symptoms worsen or fail to improve, for Keep next appt.   Dione Mccombie G. Martinique, MD  [facility]. [place] office.    A few things to remember from today's visit:   Breast mass in female - Plan: MM DIAG BREAST TOMO BILATERAL, US BREAST ASPIRATION LEFT  Lesion of soft palate - Plan: Ambulatory referral to ENT  If you need refills please call your pharmacy. Do not use My Chart to request refills or for acute issues that need immediate attention.   Somebody will be arranging appt with ENT and mammogram.  Please be sure medication list is accurate. If a new problem present, please set up appointment sooner than planned today.

## 2021-02-03 ENCOUNTER — Other Ambulatory Visit: Payer: Self-pay | Admitting: Physician Assistant

## 2021-02-05 ENCOUNTER — Other Ambulatory Visit: Payer: Self-pay | Admitting: Family Medicine

## 2021-02-05 DIAGNOSIS — N63 Unspecified lump in unspecified breast: Secondary | ICD-10-CM

## 2021-02-16 ENCOUNTER — Ambulatory Visit (INDEPENDENT_AMBULATORY_CARE_PROVIDER_SITE_OTHER): Payer: Medicare HMO | Admitting: Otolaryngology

## 2021-02-16 ENCOUNTER — Other Ambulatory Visit: Payer: Self-pay

## 2021-02-16 DIAGNOSIS — H903 Sensorineural hearing loss, bilateral: Secondary | ICD-10-CM

## 2021-02-16 DIAGNOSIS — H6123 Impacted cerumen, bilateral: Secondary | ICD-10-CM

## 2021-02-16 NOTE — Progress Notes (Signed)
HPI: Erika Brown is a 80 y.o. female who presents is referred by Dr. Martinique her PCP for evaluation of soft palate lesion on the right side.  Apparently patient noted a red lesion on the right side of the soft palate and is referred here for further evaluation of this.  She is status post tonsillectomy when she was young.  She also has some blockage of her hearing especially on the left side..  Past Medical History:  Diagnosis Date  . Anxiety and depression   . CAD (coronary artery disease)    S/P NSTEMI 4/06 EF 60%; Treated with Cypher DES to LAD and RCA with kissing balloon PCI diagonal; Treadmill myoview 10/09 for CP: Walked 7:06. mild ST-T-wave changes in recovery. EF 71%. Very mild reversible defect in base of the inferior wall --> medical rx.  . Cancer ([place])    HISTORY OF SKIN CANCER  . Cataract   . Complication of anesthesia    " DIFFICULT TO WAKE UP "  . Depression   . Glucose intolerance (impaired glucose tolerance)   . History of echocardiogram    Echo 11/17: Vigorous LVF, EF 65-70, normal wall motion, borderline diastolic function, systolic bowing of mitral valve without prolapse, mild MR, trivial TR, PASP 20  . History of medication noncompliance   . Hyperlipidemia    Intolerate to multiple statins due to severe myalgias  . Hypertension    Possible white-coat component  . MI (myocardial infarction) ([place])    acute-s/p stent  . Microhematuria   . Psoriasis   . Rapid heart beat   . Vitamin D deficiency    Past Surgical History:  Procedure Laterality Date  . ABDOMINAL HYSTERECTOMY     and anterior repair  . APPENDECTOMY    . bladder tact    . BREAST BIOPSY    . CARDIAC CATHETERIZATION  02/28/2018  . CATARACT EXTRACTION Bilateral 2017   Dr. Katy Fitch  . CORONARY STENT INTERVENTION N/A 02/28/2018   Procedure: CORONARY STENT INTERVENTION;  Surgeon: Jettie Booze, MD;  Location: [facility];  Service: Cardiovascular;  Laterality: N/A;  . CORONARY STENT  PLACEMENT    . CORONARY STENT PLACEMENT  04/02/019  . LEFT HEART CATH AND CORONARY ANGIOGRAPHY N/A 02/28/2018   Procedure: LEFT HEART CATH AND CORONARY ANGIOGRAPHY;  Surgeon: Jettie Booze, MD;  Location: [facility];  Service: Cardiovascular;  Laterality: N/A;  . MOHS SURGERY  9/14   [facility].  [facility]  . TONSILLECTOMY AND ADENOIDECTOMY    . ULTRASOUND GUIDANCE FOR VASCULAR ACCESS  02/28/2018   Procedure: Ultrasound Guidance For Vascular Access;  Surgeon: Jettie Booze, MD;  Location: [facility];  Service: Cardiovascular;;   Social History   Socioeconomic History  . Marital status: Divorced    Spouse name: Not on file  . Number of children: 7  . Years of education: Not on file  . Highest education level: Not on file  Occupational History  . Occupation: Works at home, Charity fundraiser, [employer] Use  . Smoking status: Former Research scientist (life sciences)  . Smokeless tobacco: Never Used  . Tobacco comment: only a few times  Vaping Use  . Vaping Use: Never used  Substance and Sexual Activity  . Alcohol use: Yes    Alcohol/week: 4.0 standard drinks    Types: 4 Glasses of wine per week  . Drug use: No  . Sexual activity: Not Currently    Birth control/protection: Abstinence, Surgical  Comment: TVH  Other Topics Concern  . Not on file  Social History Narrative   Lives in [place] with her husband   Seven kids   Plays tennis   Very good card player   Social Determinants of Health   Financial Resource Strain: Low Risk   . Difficulty of Paying Living Expenses: Not hard at all  Food Insecurity: No Food Insecurity  . Worried About Charity fundraiser in the Last Year: Never true  . Ran Out of Food in the Last Year: Never true  Transportation Needs: No Transportation Needs  . Lack of Transportation (Medical): No  . Lack of Transportation (Non-Medical): No  Physical Activity: Inactive  . Days of Exercise per Week: 0 days  . Minutes of Exercise per Session: 0  min  Stress: No Stress Concern Present  . Feeling of Stress : Not at all  Social Connections: Moderately Integrated  . Frequency of Communication with Friends and Family: More than three times a week  . Frequency of Social Gatherings with Friends and Family: More than three times a week  . Attends Religious Services: More than 4 times per year  . Active Member of Clubs or Organizations: Yes  . Attends Archivist Meetings: More than 4 times per year  . Marital Status: Widowed   Family History  Problem Relation Age of Onset  . Heart disease Father   . Heart attack Father   . Ulcers Father   . Hypertension Mother   . Melanoma Mother   . Cancer Daughter        melanoma  . Breast cancer Daughter   . Stroke Maternal Grandmother   . Hypertension Maternal Grandmother   . Stroke Maternal Grandfather   . Renal Disease Maternal Grandfather        kidney removed  . Hypertension Maternal Grandfather   . Diabetes Paternal Grandmother   . Melanoma Child        Metastatic  . Breast cancer Sister 25  . Hypertension Brother   . Asthma Brother   . Depression Daughter        x2  . Rectal cancer Son   . Coronary artery disease Neg Hx    Allergies  Allergen Reactions  . Iodine Other (See Comments)    Has had some SHOB and rapid heart rate in the past  . Latex Other (See Comments)    discomfort  . Other Other (See Comments)    Some foods b/c of preservatives   . Statins Other (See Comments)    Intolerance to high dosages, myalgia   Prior to Admission medications   Medication Sig Start Date End Date Taking? Authorizing Provider  amLODipine (NORVASC) 5 MG tablet TAKE 1 TABLET ONCE DAILY. 08/12/20   Jettie Booze, MD  Ascorbic Acid (VITAMIN C WITH ROSE HIPS) 500 MG tablet Take 500 mg by mouth daily.    [provider]  aspirin 81 MG EC tablet Take 81 mg by mouth daily.    [provider]  Biotin 10000 MCG TABS Take by mouth.    [provider]   Cholecalciferol (VITAMIN D3) 125 MCG (5000 UT) CAPS Take 5,000 Units by mouth daily.    [provider]  clopidogrel (PLAVIX) 75 MG tablet TAKE 1 TABLET ONCE DAILY. 02/28/20   Jettie Booze, MD  ezetimibe (ZETIA) 10 MG tablet TAKE 1 TABLET ONCE DAILY. 02/03/21   Jettie Booze, MD  irbesartan-hydrochlorothiazide (AVALIDE) 300-12.5 MG tablet Take  1 tablet by mouth daily. Please Brown and schedule follow up appointment. 01/13/21   Jettie Booze, MD  metoprolol succinate (TOPROL-XL) 50 MG 24 hr tablet TAKE 1 TABLET ONCE DAILY. 01/14/21   Jettie Booze, MD  nitroGLYCERIN (NITROSTAT) 0.4 MG SL tablet DISSOLVE 1 TABLET UNDER TONGUE EVERY 5 MINUTES AS NEEDED FOR CHEST PAIN. 05/01/20   Jettie Booze, MD  potassium chloride SA (KLOR-CON) 20 MEQ tablet TAKE 1 TABLET ONCE DAILY. 01/14/21   Jettie Booze, MD  zinc gluconate 50 MG tablet Take 50 mg by mouth daily.    [provider]     Positive ROS: Otherwise negative  All other systems have been reviewed and were otherwise negative with the exception of those mentioned in the HPI and as above.  Physical Exam: Constitutional: Alert, well-appearing, no acute distress Ears: External ears without lesions or tenderness.  She has large amount of wax in both ear canals left side worse than right.  This was cleaned with forceps and curettes.  The TMs were clear otherwise.  Subjectively on testing her hearing with the 1024 tuning fork she heard better in the right ear compared to the left with AC > BC bilaterally. Nasal: External nose without lesions. Septum with minimal deformity.. Clear nasal passages. Oral: Lips and gums without lesions. Tongue and palate mucosa without lesions. Posterior oropharynx clear.  The area that the patient points to represents some residual tonsillar tissue that was not removed with her tonsils were excised.  On palpation of this it is soft to palpation and no evidence of neoplasm.  No  signs of infection. Neck: No palpable adenopathy or masses..  No palpable adenopathy on either side of the neck. Respiratory: Breathing comfortably  Skin: No facial/neck lesions or rash noted.  Cerumen impaction removal  Date/Time: 02/16/2021 5:53 PM Performed by: Rozetta Nunnery, MD Authorized by: Rozetta Nunnery, MD   Consent:    Consent obtained:  Verbal   Consent given by:  Patient   Risks discussed:  Pain and bleeding Procedure details:    Location:  L ear and R ear   Procedure type: curette and forceps   Post-procedure details:    Inspection:  TM intact and canal normal   Hearing quality:  Improved   Patient tolerance of procedure:  Tolerated well, no immediate complications Comments:     TMs are clear bilaterally.    Assessment: The abnormality noted on the patient's palate represent residual tonsillar tissue.  This is benign. Wax buildup in both ear canals was cleaned in the office.  Of note she has some more left ear sensorineural hearing loss compared to the right but has mild hearing loss in both ears.  Plan: Reassured her of normal tissue noted on the palate area and no further therapy is needed. Cleaned her ears in the office today with normal TMs. I discussed with her that she does have some mild hearing loss on hearing screening with use of the tuning forks.  Would recommend consideration of obtaining audiologic testing to further evaluate hearing loss.   Radene Journey, MD   CC:

## 2021-03-04 ENCOUNTER — Other Ambulatory Visit: Payer: Self-pay | Admitting: Interventional Cardiology

## 2021-03-04 DIAGNOSIS — I2510 Atherosclerotic heart disease of native coronary artery without angina pectoris: Secondary | ICD-10-CM

## 2021-03-10 ENCOUNTER — Ambulatory Visit (INDEPENDENT_AMBULATORY_CARE_PROVIDER_SITE_OTHER): Payer: Medicare HMO | Admitting: Family Medicine

## 2021-03-10 ENCOUNTER — Other Ambulatory Visit: Payer: Self-pay

## 2021-03-10 ENCOUNTER — Encounter: Payer: Self-pay | Admitting: Family Medicine

## 2021-03-10 VITALS — BP 110/70 | HR 96 | Resp 16 | Ht 64.75 in | Wt 144.2 lb

## 2021-03-10 DIAGNOSIS — I119 Hypertensive heart disease without heart failure: Secondary | ICD-10-CM

## 2021-03-10 DIAGNOSIS — E876 Hypokalemia: Secondary | ICD-10-CM | POA: Diagnosis not present

## 2021-03-10 DIAGNOSIS — E785 Hyperlipidemia, unspecified: Secondary | ICD-10-CM | POA: Diagnosis not present

## 2021-03-10 DIAGNOSIS — Z0189 Encounter for other specified special examinations: Secondary | ICD-10-CM

## 2021-03-10 DIAGNOSIS — Z789 Other specified health status: Secondary | ICD-10-CM | POA: Diagnosis not present

## 2021-03-10 LAB — COMPREHENSIVE METABOLIC PANEL
ALT: 17 U/L (ref 0–35)
AST: 16 U/L (ref 0–37)
Albumin: 3.8 g/dL (ref 3.5–5.2)
Alkaline Phosphatase: 85 U/L (ref 39–117)
BUN: 17 mg/dL (ref 6–23)
CO2: 29 mEq/L (ref 19–32)
Calcium: 10 mg/dL (ref 8.4–10.5)
Chloride: 103 mEq/L (ref 96–112)
Creatinine, Ser: 0.72 mg/dL (ref 0.40–1.20)
GFR: 78.52 mL/min (ref >60.00)
Glucose, Bld: 90 mg/dL (ref 70–99)
Potassium: 3.7 mEq/L (ref 3.5–5.1)
Sodium: 141 mEq/L (ref 135–145)
Total Bilirubin: 0.6 mg/dL (ref 0.2–1.2)
Total Protein: 6.5 g/dL (ref 6.0–8.3)

## 2021-03-10 LAB — SARS-COV-2 IGG: SARS-COV-2 IgG: 23.64

## 2021-03-10 LAB — LIPID PANEL
Cholesterol: 199 mg/dL (ref 0–200)
HDL: 65.4 mg/dL (ref >39.00)
LDL Cholesterol: 115 mg/dL — ABNORMAL HIGH (ref 0–99)
NonHDL: 133.57
Total CHOL/HDL Ratio: 3
Triglycerides: 91 mg/dL (ref 0.0–149.0)
VLDL: 18.2 mg/dL (ref 0.0–40.0)

## 2021-03-10 NOTE — Assessment & Plan Note (Addendum)
Continue Zetia 10 mg daily and low fat diet. Further recommendations according to lipid panel results.

## 2021-03-10 NOTE — Assessment & Plan Note (Signed)
BP adequately controlled. No changes in current management. Continue monitoring BP at home.

## 2021-03-10 NOTE — Assessment & Plan Note (Signed)
Continue KLOR 20 meq daily. Further recommendations according to K+ result. 

## 2021-03-10 NOTE — Progress Notes (Signed)
HPI: Erika Brown is a very pleasant 80 y.o. female, who is here today for 6 months follow up.   She was last seen on 01/27/21 for acute visit. Since her last visit, she saw ENT, she was reassure about tonsil lesion. Dx'ed with sensorineural hearing loss, she has not decided about hearing aids yet.  HTN: She is on Amlodipine 5 mg daily,Metoprolol succinate, and Ibesartan-HCTZ 300-12.5 mg daily. BP 134/70's at home. Negative for severe/frequent headache, visual changes, chest pain, dyspnea, palpitation, focal weakness, or edema.  HypoK+, she is on KOR 20 meq daily.  Lab Results  Component Value Date   CREATININE 0.75 09/09/2020   BUN 18 09/09/2020   NA 140 09/09/2020   K 3.7 09/09/2020   CL 103 09/09/2020   CO2 30 09/09/2020   She is requesting COVID 19 ab checked. COVID 19 infection in 11/2019.  Hyperlipidemia: Currently she is on Zetia 10 mg daily. She is tolerating medication well. + CAD, follows with cardiologist regularly, Erika Brown. She is on Plavix 75 mg daily and Aspirin 81 mg daily.  She has not tolerated statins in the past, caused myalgias.  Lab Results  Component Value Date   CHOL 215 (H) 03/12/2020   HDL 72.20 03/12/2020   LDLCALC 128 (H) 03/12/2020   LDLDIRECT 121.7 09/08/2007   TRIG 78.0 03/12/2020   CHOLHDL 3 03/12/2020   Review of Systems  Constitutional: Negative for activity change, appetite change, fatigue and fever.  HENT: Negative for mouth sores, nosebleeds and sore throat.   Respiratory: Negative for cough and wheezing.   Gastrointestinal: Negative for abdominal pain, nausea and vomiting.       Negative for changes in bowel habits.  Genitourinary: Negative for decreased urine volume and hematuria.  Musculoskeletal: Negative for gait problem and myalgias.  Neurological: Negative for syncope, facial asymmetry and weakness.  Rest of ROS, see pertinent positives sand negatives in HPI  Current Outpatient Medications on File Prior to  Visit  Medication Sig Dispense Refill  . amLODipine (NORVASC) 5 MG tablet TAKE 1 TABLET ONCE DAILY. 90 tablet 3  . Ascorbic Acid (VITAMIN C WITH ROSE HIPS) 500 MG tablet Take 500 mg by mouth daily.    [name] aspirin 81 MG EC tablet Take 81 mg by mouth daily.    . Biotin 10000 MCG TABS Take by mouth.    . Cholecalciferol (VITAMIN D3) 125 MCG (5000 UT) CAPS Take 5,000 Units by mouth daily.    . clopidogrel (PLAVIX) 75 MG tablet TAKE 1 TABLET ONCE DAILY. 90 tablet 3  . ezetimibe (ZETIA) 10 MG tablet TAKE 1 TABLET ONCE DAILY. 90 tablet 1  . irbesartan-hydrochlorothiazide (AVALIDE) 300-12.5 MG tablet Take 1 tablet by mouth daily. Please call and schedule follow up appointment. 90 tablet 0  . metoprolol succinate (TOPROL-XL) 50 MG 24 hr tablet TAKE 1 TABLET ONCE DAILY. 90 tablet 3  . nitroGLYCERIN (NITROSTAT) 0.4 MG SL tablet DISSOLVE 1 TABLET UNDER TONGUE EVERY 5 MINUTES AS NEEDED FOR CHEST PAIN. 25 tablet 0  . potassium chloride SA (KLOR-CON) 20 MEQ tablet TAKE 1 TABLET ONCE DAILY. 90 tablet 3  . zinc gluconate 50 MG tablet Take 50 mg by mouth daily.     No current facility-administered medications on file prior to visit.   Past Medical History:  Diagnosis Date  . Anxiety and depression   . CAD (coronary artery disease)    S/P NSTEMI 4/06 EF 60%; Treated with Cypher DES to LAD and RCA with kissing balloon  PCI diagonal; Treadmill myoview 10/09 for CP: Walked 7:06. mild ST-T-wave changes in recovery. EF 71%. Very mild reversible defect in base of the inferior wall --> medical rx.  . Cancer ([place])    HISTORY OF SKIN CANCER  . Cataract   . Complication of anesthesia    " DIFFICULT TO WAKE UP "  . Depression   . Glucose intolerance (impaired glucose tolerance)   . History of echocardiogram    Echo 11/17: Vigorous LVF, EF 65-70, normal wall motion, borderline diastolic function, systolic bowing of mitral valve without prolapse, mild MR, trivial TR, PASP 20  . History of medication noncompliance   .  Hyperlipidemia    Intolerate to multiple statins due to severe myalgias  . Hypertension    Possible white-coat component  . MI (myocardial infarction) ([place])    acute-s/p stent  . Microhematuria   . Psoriasis   . Rapid heart beat   . Vitamin D deficiency    Allergies  Allergen Reactions  . Iodine Other (See Comments)    Has had some SHOB and rapid heart rate in the past  . Latex Other (See Comments)    discomfort  . Other Other (See Comments)    Some foods b/c of preservatives   . Statins Other (See Comments)    Intolerance to high dosages, myalgia    Social History   Socioeconomic History  . Marital status: Divorced    Spouse name: Not on file  . Number of children: 7  . Years of education: Not on file  . Highest education level: Not on file  Occupational History  . 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 Smoking status: Former Research scientist (life sciences)  . Smokeless tobacco: Never Used  . Tobacco comment: only a few times  Vaping Use  . Vaping Use: Never used  Substance and Sexual Activity  . Alcohol use: Yes    Alcohol/week: 4.0 standard drinks    Types: 4 Glasses of wine per week  . Drug use: No  . Sexual activity: Not Currently    Birth control/protection: Abstinence, Surgical    Comment: TVH  Other Topics Concern  . Not on file  Social History Narrative   Lives in [place] with her husband   Seven kids   Plays tennis   Very good card player   Social Determinants of Health   Financial Resource Strain: Low Risk   . Difficulty of Paying Living Expenses: Not hard at all  Food Insecurity: No Food Insecurity  . Worried About Charity fundraiser in the Last Year: Never true  . Ran Out of Food in the Last Year: Never true  Transportation Needs: No Transportation Needs  . Brown of Transportation (Medical): No  . Brown of Transportation (Non-Medical): No  Physical Activity: Inactive  . Days of Exercise per Week: 0 days  . Minutes of Exercise  per Session: 0 min  Stress: No Stress Concern Present  . Feeling of Stress : Not at all  Social Connections: Moderately Integrated  . Frequency of Communication with Friends and Family: More than three times a week  . Frequency of Social Gatherings with Friends and Family: More than three times a week  . Attends Religious Services: More than 4 times per year  . Active Member of Clubs or Organizations: Yes  . Attends Archivist Meetings: More than 4 times per year  . Marital Status: Widowed   Vitals:   03/10/21 [phone]  BP: 110/70  Pulse: 96  Resp: 16  SpO2: 96%   Body mass index is 24.19 kg/m.  Physical Exam Vitals and nursing note reviewed.  Constitutional:      General: She is not in acute distress.    Appearance: She is well-developed.  HENT:     Head: Normocephalic and atraumatic.     Mouth/Throat:     Mouth: Mucous membranes are moist.  Eyes:     Conjunctiva/sclera: Conjunctivae normal.  Cardiovascular:     Rate and Rhythm: Normal rate and regular rhythm.     Pulses:          Posterior tibial pulses are 2+ on the right side and 2+ on the left side.     Heart sounds: No murmur heard.   Pulmonary:     Effort: Pulmonary effort is normal. No respiratory distress.     Breath sounds: Normal breath sounds.  Abdominal:     Palpations: Abdomen is soft. There is no hepatomegaly or mass.     Tenderness: There is no abdominal tenderness.  Lymphadenopathy:     Cervical: No cervical adenopathy.  Skin:    General: Skin is warm.     Findings: No erythema or rash.  Neurological:     General: No focal deficit present.     Mental Status: She is alert and oriented to person, place, and time.     Cranial Nerves: No cranial nerve deficit.     Gait: Gait normal.  Psychiatric:     Comments: Well groomed, good eye contact.    ASSESSMENT AND PLAN:  Ms. BERYLE BAGSBY was seen today for 6 months follow-up.  Orders Placed This Encounter  Procedures  . Comprehensive  metabolic panel  . Lipid panel  . SARS-COV-2 IgG   Lab Results  Component Value Date   CREATININE 0.72 03/10/2021   BUN 17 03/10/2021   NA 141 03/10/2021   K 3.7 03/10/2021   CL 103 03/10/2021   CO2 29 03/10/2021   Lab Results  Component Value Date   CHOL 199 03/10/2021   HDL 65.40 03/10/2021   LDLCALC 115 (H) 03/10/2021   LDLDIRECT 121.7 09/08/2007   TRIG 91.0 03/10/2021   CHOLHDL 3 03/10/2021   Lab Results  Component Value Date   ALT 17 03/10/2021   AST 16 03/10/2021   ALKPHOS 85 03/10/2021   BILITOT 0.6 03/10/2021    Hyperlipidemia LDL goal <70 Continue Zetia 10 mg daily and low fat diet. Further recommendations according to lipid panel results.  Hypertension with heart disease BP adequately controlled. No changes in current management. Continue monitoring BP at home.  Hypokalemia Continue KLOR 20 meq daily. Further recommendations according to K+ result.  Patient request for diagnostic testing -     SARS-COV-2 IgG  Unknown status of immunity to COVID-19 virus - SARS-COV-2 IgG  Return in about 6 months (around 09/09/2021).   Bennie Scaff G. Martinique, MD  [facility]. [place] office.

## 2021-03-20 ENCOUNTER — Ambulatory Visit
Admission: RE | Admit: 2021-03-20 | Discharge: 2021-03-20 | Disposition: A | Discharge status: Home / Self Care | Payer: Medicare HMO | Source: Ambulatory Visit | Attending: Family Medicine | Admitting: Family Medicine

## 2021-03-20 ENCOUNTER — Other Ambulatory Visit: Payer: Self-pay | Admitting: Family Medicine

## 2021-03-20 ENCOUNTER — Other Ambulatory Visit: Payer: Self-pay

## 2021-03-20 ENCOUNTER — Ambulatory Visit (HOSPITAL_COMMUNITY): Payer: Medicare HMO

## 2021-03-20 DIAGNOSIS — N63 Unspecified lump in unspecified breast: Secondary | ICD-10-CM

## 2021-03-20 DIAGNOSIS — R928 Other abnormal and inconclusive findings on diagnostic imaging of breast: Secondary | ICD-10-CM | POA: Diagnosis not present

## 2021-03-20 DIAGNOSIS — R922 Inconclusive mammogram: Secondary | ICD-10-CM | POA: Diagnosis not present

## 2021-03-20 DIAGNOSIS — N6323 Unspecified lump in the left breast, lower outer quadrant: Secondary | ICD-10-CM | POA: Diagnosis not present

## 2021-03-20 DIAGNOSIS — N6321 Unspecified lump in the left breast, upper outer quadrant: Secondary | ICD-10-CM | POA: Diagnosis not present

## 2021-03-20 DIAGNOSIS — N6311 Unspecified lump in the right breast, upper outer quadrant: Secondary | ICD-10-CM | POA: Diagnosis not present

## 2021-03-20 DIAGNOSIS — N6313 Unspecified lump in the right breast, lower outer quadrant: Secondary | ICD-10-CM | POA: Diagnosis not present

## 2021-03-25 ENCOUNTER — Ambulatory Visit
Admission: RE | Admit: 2021-03-25 | Discharge: 2021-03-25 | Disposition: A | Discharge status: Home / Self Care | Payer: Medicare HMO | Source: Ambulatory Visit | Attending: Family Medicine | Admitting: Family Medicine

## 2021-03-25 ENCOUNTER — Other Ambulatory Visit: Payer: Self-pay

## 2021-03-25 DIAGNOSIS — N63 Unspecified lump in unspecified breast: Secondary | ICD-10-CM

## 2021-03-25 DIAGNOSIS — N6321 Unspecified lump in the left breast, upper outer quadrant: Secondary | ICD-10-CM | POA: Diagnosis not present

## 2021-03-25 DIAGNOSIS — C50812 Malignant neoplasm of overlapping sites of left female breast: Secondary | ICD-10-CM | POA: Diagnosis not present

## 2021-03-30 ENCOUNTER — Encounter: Payer: Self-pay | Admitting: *Deleted

## 2021-03-30 DIAGNOSIS — Z170 Estrogen receptor positive status [ER+]: Secondary | ICD-10-CM

## 2021-03-30 DIAGNOSIS — C50412 Malignant neoplasm of upper-outer quadrant of left female breast: Secondary | ICD-10-CM | POA: Insufficient documentation

## 2021-03-31 NOTE — Progress Notes (Signed)
[facility] CONSULT NOTE  Patient Care Team: Martinique, Betty G, MD as PCP - General (Family Medicine) Jettie Booze, MD as PCP - Cardiology (Cardiology) Rockwell Germany, RN as Oncology Nurse Navigator Mauro Kaufmann, RN as Oncology Nurse Navigator Rolm Bookbinder, MD as Consulting Physician (General Surgery) Nicholas Lose, MD as Consulting Physician (Hematology and Oncology) Kyung Rudd, MD as Consulting Physician (Radiation Oncology)  CHIEF COMPLAINTS/PURPOSE OF CONSULTATION:  Newly diagnosed breast cancer  HISTORY OF PRESENTING ILLNESS:  Erika Brown 80 y.o. female is here because of recent diagnosis of invasive ductal carcinoma of the left breast. Patient palpated a left breast abnormality for 7 weeks. Diagnostic mammogram and [place] on 03/20/21 showed a 1.6cm mass at the 3 o'clock position in the left breast and no left axillary adenopathy. Biopsy on 03/25/21 showed invasive and in situ ductal carcinoma, grade 1, HER-2 equivocal by IHC, negative by FISH (ratio 1.45). ER/PR+ >95%, Ki67 1%. She presents to the clinic today for initial evaluation and discussion of treatment options.   I reviewed her records extensively and collaborated the history with the patient.  SUMMARY OF ONCOLOGIC HISTORY: Oncology History  Malignant neoplasm of upper-outer quadrant of left breast in female, estrogen receptor positive ([place])  03/30/2021 Initial Diagnosis   Patient palpated a left breast abnormality for 7 weeks. Diagnostic mammogram and US showed a 1.6cm mass at the 3 o'clock position in the left breast and no left axillary adenopathy. Biopsy showed invasive and in situ ductal carcinoma, grade 1, HER-2 equivocal by IHC, negative by FISH (ratio 1.45). ER/PR+ >95%, Ki67 1%.     MEDICAL HISTORY:  Past Medical History:  Diagnosis Date  . Anxiety and depression   . Breast cancer ([place])   . CAD (coronary artery disease)    S/P NSTEMI 4/06 EF 60%; Treated with Cypher DES to LAD and  RCA with kissing balloon PCI diagonal; Treadmill myoview 10/09 for CP: Walked 7:06. mild ST-T-wave changes in recovery. EF 71%. Very mild reversible defect in base of the inferior wall --> medical rx.  . Cancer ([place])    HISTORY OF SKIN CANCER  . Cataract   . Complication of anesthesia    " DIFFICULT TO WAKE UP "  . Depression   . Glucose intolerance (impaired glucose tolerance)   . History of echocardiogram    Echo 11/17: Vigorous LVF, EF 65-70, normal wall motion, borderline diastolic function, systolic bowing of mitral valve without prolapse, mild MR, trivial TR, PASP 20  . History of medication noncompliance   . Hyperlipidemia    Intolerate to multiple statins due to severe myalgias  . Hypertension    Possible white-coat component  . MI (myocardial infarction) ([place])    acute-s/p stent  . Microhematuria   . Psoriasis   . Rapid heart beat   . Vitamin D deficiency     SURGICAL HISTORY: Past Surgical History:  Procedure Laterality Date  . ABDOMINAL HYSTERECTOMY     and anterior repair  . APPENDECTOMY    . bladder tact    . BREAST BIOPSY    . CARDIAC CATHETERIZATION  02/28/2018  . CATARACT EXTRACTION Bilateral 2017   Dr. Katy Fitch  . CORONARY STENT INTERVENTION N/A 02/28/2018   Procedure: CORONARY STENT INTERVENTION;  Surgeon: Jettie Booze, MD;  Location: [facility];  Service: Cardiovascular;  Laterality: N/A;  . CORONARY STENT PLACEMENT    . CORONARY STENT PLACEMENT  04/02/019  . LEFT HEART CATH AND CORONARY ANGIOGRAPHY N/A 02/28/2018  Procedure: LEFT HEART CATH AND CORONARY ANGIOGRAPHY;  Surgeon: Jettie Booze, MD;  Location: [facility];  Service: Cardiovascular;  Laterality: N/A;  . MOHS SURGERY  9/14   BCC.  [facility]  . TONSILLECTOMY AND ADENOIDECTOMY    . ULTRASOUND GUIDANCE FOR VASCULAR ACCESS  02/28/2018   Procedure: Ultrasound Guidance For Vascular Access;  Surgeon: Jettie Booze, MD;  Location: [facility];  Service:  Cardiovascular;;    SOCIAL HISTORY: Social History   Socioeconomic History  . Marital status: Widowed    Spouse name: Not on file  . Number of children: 7  . Years of education: Not on file  . Highest education level: Not on file  Occupational History  . 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 Smoking status: Former Research scientist (life sciences)  . Smokeless tobacco: Never Used  . Tobacco comment: only a few times  Vaping Use  . Vaping Use: Never used  Substance and Sexual Activity  . Alcohol use: Yes    Alcohol/week: 4.0 standard drinks    Types: 4 Glasses of wine per week  . Drug use: No  . Sexual activity: Not Currently    Birth control/protection: Abstinence, Surgical    Comment: TVH  Other Topics Concern  . Not on file  Social History Narrative   Lives in [place] with her husband   Seven kids   Plays tennis   Very good card player   Social Determinants of Health   Financial Resource Strain: Low Risk   . Difficulty of Paying Living Expenses: Not hard at all  Food Insecurity: No Food Insecurity  . Worried About Charity fundraiser in the Last Year: Never true  . Ran Out of Food in the Last Year: Never true  Transportation Needs: No Transportation Needs  . Lack of Transportation (Medical): No  . Lack of Transportation (Non-Medical): No  Physical Activity: Inactive  . Days of Exercise per Week: 0 days  . Minutes of Exercise per Session: 0 min  Stress: No Stress Concern Present  . Feeling of Stress : Not at all  Social Connections: Moderately Integrated  . Frequency of Communication with Friends and Family: More than three times a week  . Frequency of Social Gatherings with Friends and Family: More than three times a week  . Attends Religious Services: More than 4 times per year  . Active Member of Clubs or Organizations: Yes  . Attends Archivist Meetings: More than 4 times per year  . Marital Status: Widowed  Intimate Partner Violence:  Not At Risk  . Fear of Current or Ex-Partner: No  . Emotionally Abused: No  . Physically Abused: No  . Sexually Abused: No    FAMILY HISTORY: Family History  Problem Relation Age of Onset  . Heart disease Father   . Heart attack Father   . Ulcers Father   . Hypertension Mother   . Melanoma Mother   . Cancer Daughter        melanoma  . Breast cancer Daughter 50  . Stroke Maternal Grandmother   . Hypertension Maternal Grandmother   . Stroke Maternal Grandfather   . Renal Disease Maternal Grandfather        kidney removed  . Hypertension Maternal Grandfather   . Diabetes Paternal Grandmother   . Melanoma Child        Metastatic  . Breast cancer Sister 51  . Hypertension Brother   . Asthma  Brother   . Depression Daughter        x2  . Rectal cancer Son   . Coronary artery disease Neg Hx     ALLERGIES:  is allergic to iodine, latex, other, and statins.  MEDICATIONS:  Current Outpatient Medications  Medication Sig Dispense Refill  . amLODipine (NORVASC) 5 MG tablet TAKE 1 TABLET ONCE DAILY. 90 tablet 3  . Ascorbic Acid (VITAMIN C WITH ROSE HIPS) 500 MG tablet Take 500 mg by mouth daily.    [name] aspirin 81 MG EC tablet Take 81 mg by mouth daily.    . Biotin 10000 MCG TABS Take by mouth.    . Cholecalciferol (VITAMIN D3) 125 MCG (5000 UT) CAPS Take 5,000 Units by mouth daily.    . clopidogrel (PLAVIX) 75 MG tablet TAKE 1 TABLET ONCE DAILY. 90 tablet 3  . ezetimibe (ZETIA) 10 MG tablet TAKE 1 TABLET ONCE DAILY. 90 tablet 1  . irbesartan-hydrochlorothiazide (AVALIDE) 300-12.5 MG tablet Take 1 tablet by mouth daily. Please call and schedule follow up appointment. 90 tablet 0  . metoprolol succinate (TOPROL-XL) 50 MG 24 hr tablet TAKE 1 TABLET ONCE DAILY. 90 tablet 3  . nitroGLYCERIN (NITROSTAT) 0.4 MG SL tablet DISSOLVE 1 TABLET UNDER TONGUE EVERY 5 MINUTES AS NEEDED FOR CHEST PAIN. 25 tablet 0  . potassium chloride SA (KLOR-CON) 20 MEQ tablet TAKE 1 TABLET ONCE DAILY. 90 tablet 3   . zinc gluconate 50 MG tablet Take 50 mg by mouth daily.     No current facility-administered medications for this visit.    REVIEW OF SYSTEMS:   All other systems were reviewed with the patient and are negative.  PHYSICAL EXAMINATION: ECOG PERFORMANCE STATUS: 0 - Asymptomatic  Vitals:   04/01/21 1310  BP: 132/66  Pulse: 77  Temp: (!) 97 F (36.1 C)  SpO2: 97%   Filed Weights   04/01/21 1310  Weight: 145 lb 6.4 oz (66 kg)    LABORATORY DATA:  I have reviewed the data as listed Lab Results  Component Value Date   WBC 5.6 04/01/2021   HGB 12.5 04/01/2021   HCT 39.1 04/01/2021   MCV 85.6 04/01/2021   PLT 229 04/01/2021   Lab Results  Component Value Date   NA 139 04/01/2021   K 4.1 04/01/2021   CL 102 04/01/2021   CO2 29 04/01/2021    RADIOGRAPHIC STUDIES: I have personally reviewed the radiological reports and agreed with the findings in the report.  ASSESSMENT AND PLAN:  Malignant neoplasm of upper-outer quadrant of left breast in female, estrogen receptor positive ([place]) 04/17/2021:Patient palpated a left breast abnormality for 7 weeks. Diagnostic mammogram and US showed a 1.6cm mass at the 3 o'clock position in the left breast and no left axillary adenopathy. Biopsy showed invasive and in situ ductal carcinoma, grade 1, HER-2 equivocal by IHC, negative by FISH (ratio 1.45). ER/PR+ >95%, Ki67 1%.  Pathology and radiology counseling: Discussed with the patient, the details of pathology including the type of breast cancer,the clinical staging, the significance of ER, PR and HER-2/neu receptors and the implications for treatment. After reviewing the pathology in detail, we proceeded to discuss the different treatment options between surgery, radiation, and antiestrogen therapies.  Treatment plan: 1.  Breast conserving surgery 2. followed by adjuvant antiestrogen therapy Radiation oncology did not feel that she would benefit from adjuvant radiation.  Genetic  testing  Return to clinic after surgery to discuss final pathology report    All questions were  answered. The patient knows to call the clinic with any problems, questions or concerns.   Rulon Eisenmenger, MD, MPH 04/01/2021    I, Molly Dorshimer, am acting as scribe for Nicholas Lose, MD.  I have reviewed the above documentation for accuracy and completeness, and I agree with the above.

## 2021-04-01 ENCOUNTER — Encounter: Payer: Self-pay | Admitting: General Practice

## 2021-04-01 ENCOUNTER — Encounter: Payer: Self-pay | Admitting: Genetic Counselor

## 2021-04-01 ENCOUNTER — Other Ambulatory Visit: Payer: Self-pay

## 2021-04-01 ENCOUNTER — Inpatient Hospital Stay: Payer: Medicare HMO | Attending: Hematology and Oncology | Admitting: Hematology and Oncology

## 2021-04-01 ENCOUNTER — Ambulatory Visit
Admission: RE | Admit: 2021-04-01 | Discharge: 2021-04-01 | Disposition: A | Discharge status: Home / Self Care | Payer: Medicare HMO | Source: Ambulatory Visit | Attending: Radiation Oncology | Admitting: Radiation Oncology

## 2021-04-01 ENCOUNTER — Encounter: Payer: Self-pay | Admitting: Hematology and Oncology

## 2021-04-01 ENCOUNTER — Encounter: Payer: Self-pay | Admitting: *Deleted

## 2021-04-01 ENCOUNTER — Ambulatory Visit: Payer: Medicare HMO | Admitting: Physical Therapy

## 2021-04-01 ENCOUNTER — Ambulatory Visit (HOSPITAL_BASED_OUTPATIENT_CLINIC_OR_DEPARTMENT_OTHER): Payer: Medicare HMO | Admitting: Genetic Counselor

## 2021-04-01 ENCOUNTER — Other Ambulatory Visit: Payer: Self-pay | Admitting: General Surgery

## 2021-04-01 ENCOUNTER — Inpatient Hospital Stay: Payer: Medicare HMO

## 2021-04-01 DIAGNOSIS — C50412 Malignant neoplasm of upper-outer quadrant of left female breast: Secondary | ICD-10-CM

## 2021-04-01 DIAGNOSIS — Z808 Family history of malignant neoplasm of other organs or systems: Secondary | ICD-10-CM | POA: Insufficient documentation

## 2021-04-01 DIAGNOSIS — Z170 Estrogen receptor positive status [ER+]: Secondary | ICD-10-CM | POA: Diagnosis not present

## 2021-04-01 DIAGNOSIS — E785 Hyperlipidemia, unspecified: Secondary | ICD-10-CM | POA: Insufficient documentation

## 2021-04-01 DIAGNOSIS — Z87891 Personal history of nicotine dependence: Secondary | ICD-10-CM | POA: Insufficient documentation

## 2021-04-01 DIAGNOSIS — Z800 Family history of malignant neoplasm of digestive organs: Secondary | ICD-10-CM

## 2021-04-01 DIAGNOSIS — I2510 Atherosclerotic heart disease of native coronary artery without angina pectoris: Secondary | ICD-10-CM | POA: Diagnosis not present

## 2021-04-01 DIAGNOSIS — Z79899 Other long term (current) drug therapy: Secondary | ICD-10-CM | POA: Insufficient documentation

## 2021-04-01 DIAGNOSIS — I10 Essential (primary) hypertension: Secondary | ICD-10-CM | POA: Diagnosis not present

## 2021-04-01 DIAGNOSIS — I252 Old myocardial infarction: Secondary | ICD-10-CM | POA: Diagnosis not present

## 2021-04-01 DIAGNOSIS — Z7982 Long term (current) use of aspirin: Secondary | ICD-10-CM | POA: Insufficient documentation

## 2021-04-01 DIAGNOSIS — Z803 Family history of malignant neoplasm of breast: Secondary | ICD-10-CM | POA: Insufficient documentation

## 2021-04-01 DIAGNOSIS — E559 Vitamin D deficiency, unspecified: Secondary | ICD-10-CM | POA: Diagnosis not present

## 2021-04-01 HISTORY — DX: Family history of malignant neoplasm of digestive organs: Z80.0

## 2021-04-01 HISTORY — DX: Family history of malignant neoplasm of breast: Z80.3

## 2021-04-01 HISTORY — DX: Family history of malignant neoplasm of other organs or systems: Z80.8

## 2021-04-01 LAB — CMP (CANCER CENTER ONLY)
ALT: 18 U/L (ref 0–44)
AST: 19 U/L (ref 15–41)
Albumin: 3.7 g/dL (ref 3.5–5.0)
Alkaline Phosphatase: 80 U/L (ref 38–126)
Anion gap: 8 (ref 5–15)
BUN: 20 mg/dL (ref 8–23)
CO2: 29 mmol/L (ref 22–32)
Calcium: 9.7 mg/dL (ref 8.9–10.3)
Chloride: 102 mmol/L (ref 98–111)
Creatinine: 0.81 mg/dL (ref 0.44–1.00)
GFR, Estimated: >60 mL/min (ref >60)
Glucose, Bld: 99 mg/dL (ref 70–99)
Potassium: 4.1 mmol/L (ref 3.5–5.1)
Sodium: 139 mmol/L (ref 135–145)
Total Bilirubin: 0.6 mg/dL (ref 0.3–1.2)
Total Protein: 6.7 g/dL (ref 6.5–8.1)

## 2021-04-01 LAB — CBC WITH DIFFERENTIAL (CANCER CENTER ONLY)
Abs Immature Granulocytes: 0.02 10*3/uL (ref 0.00–0.07)
Basophils Absolute: 0.1 10*3/uL (ref 0.0–0.1)
Basophils Relative: 1 %
Eosinophils Absolute: 0.2 10*3/uL (ref 0.0–0.5)
Eosinophils Relative: 4 %
HCT: 39.1 % (ref 36.0–46.0)
Hemoglobin: 12.5 g/dL (ref 12.0–15.0)
Immature Granulocytes: 0 %
Lymphocytes Relative: 28 %
Lymphs Abs: 1.6 10*3/uL (ref 0.7–4.0)
MCH: 27.4 pg (ref 26.0–34.0)
MCHC: 32 g/dL (ref 30.0–36.0)
MCV: 85.6 fL (ref 80.0–100.0)
Monocytes Absolute: 0.6 10*3/uL (ref 0.1–1.0)
Monocytes Relative: 11 %
Neutro Abs: 3.2 10*3/uL (ref 1.7–7.7)
Neutrophils Relative %: 56 %
Platelet Count: 229 10*3/uL (ref 150–400)
RBC: 4.57 MIL/uL (ref 3.87–5.11)
RDW: 13.3 % (ref 11.5–15.5)
WBC Count: 5.6 10*3/uL (ref 4.0–10.5)
nRBC: 0 % (ref 0.0–0.2)

## 2021-04-01 LAB — GENETIC SCREENING ORDER

## 2021-04-01 NOTE — Progress Notes (Signed)
[facility] Psychosocial Distress Screening Spiritual Care  Met with Shriley and her daughter Vear Clock in [facility] to introduce [facility] team/resources, reviewing distress screen per protocol.  The patient scored a 2 on the Psychosocial Distress Thermometer which indicates mild distress. Also assessed for distress and other psychosocial needs.   ONCBCN DISTRESS SCREENING 04/01/2021  Screening Type Initial Screening  Distress experienced in past week (1-10) 2  Information Concerns Type Lack of info about treatment;Lack of info about maintaining fitness  Referral to support programs Yes   Ms Velasquez reports great support from family, friends, neighbors, and her [employer]. She is an avid gardener and Publishing copy.  Follow up needed: Yes.  Ms No welcomes pastoral check-in call in ca two weeks.   [place], [place], [facility] Pager [phone] Voicemail [phone]

## 2021-04-01 NOTE — Progress Notes (Signed)
Radiation Oncology         [phone] ________________________________  Name: Erika Brown        MRN: [id number]  Date of Service: 04/01/2021 DOB: Aug 04, 1941  SV:[id number], Malka So, MD  Rolm Bookbinder, MD     REFERRING PHYSICIAN: Rolm Bookbinder, MD   DIAGNOSIS: The encounter diagnosis was Malignant neoplasm of upper-outer quadrant of left breast in female, estrogen receptor positive ([place]).   HISTORY OF PRESENT ILLNESS: Erika Brown is a 80 y.o. female seen in the multidisciplinary breast clinic for a new diagnosis of left breast cancer. The patient was noted to have a palpable mass in the left breast for about 2 months. Diagnostic imaging revealed a mass in the left breast at 3:00. It measured 1.6 cm and her axilla was negative for adenopathy. A biopsy revealed a grade 1 invasive ductal carcinoma with associated DCIS. Her tumor was ER/PR positive, HER2 was negative and Ki 67 was 1%. She's seen today to discuss treatment of her cancer.   PREVIOUS RADIATION THERAPY: No   PAST MEDICAL HISTORY:  Past Medical History:  Diagnosis Date  . Anxiety and depression   . CAD (coronary artery disease)    S/P NSTEMI 4/06 EF 60%; Treated with Cypher DES to LAD and RCA with kissing balloon PCI diagonal; Treadmill myoview 10/09 for CP: Walked 7:06. mild ST-T-wave changes in recovery. EF 71%. Very mild reversible defect in base of the inferior wall --> medical rx.  . Cancer ([place])    HISTORY OF SKIN CANCER  . Cataract   . Complication of anesthesia    " DIFFICULT TO WAKE UP "  . Depression   . Glucose intolerance (impaired glucose tolerance)   . History of echocardiogram    Echo 11/17: Vigorous LVF, EF 65-70, normal wall motion, borderline diastolic function, systolic bowing of mitral valve without prolapse, mild MR, trivial TR, PASP 20  . History of medication noncompliance   . Hyperlipidemia    Intolerate to multiple statins due to severe myalgias  . Hypertension    Possible  white-coat component  . MI (myocardial infarction) ([place])    acute-s/p stent  . Microhematuria   . Psoriasis   . Rapid heart beat   . Vitamin D deficiency        PAST SURGICAL HISTORY: Past Surgical History:  Procedure Laterality Date  . ABDOMINAL HYSTERECTOMY     and anterior repair  . APPENDECTOMY    . bladder tact    . BREAST BIOPSY    . CARDIAC CATHETERIZATION  02/28/2018  . CATARACT EXTRACTION Bilateral 2017   Dr. Katy Fitch  . CORONARY STENT INTERVENTION N/A 02/28/2018   Procedure: CORONARY STENT INTERVENTION;  Surgeon: Jettie Booze, MD;  Location: [facility];  Service: Cardiovascular;  Laterality: N/A;  . CORONARY STENT PLACEMENT    . CORONARY STENT PLACEMENT  04/02/019  . LEFT HEART CATH AND CORONARY ANGIOGRAPHY N/A 02/28/2018   Procedure: LEFT HEART CATH AND CORONARY ANGIOGRAPHY;  Surgeon: Jettie Booze, MD;  Location: [facility];  Service: Cardiovascular;  Laterality: N/A;  . MOHS SURGERY  9/14   BCC.  [facility]  . TONSILLECTOMY AND ADENOIDECTOMY    . ULTRASOUND GUIDANCE FOR VASCULAR ACCESS  02/28/2018   Procedure: Ultrasound Guidance For Vascular Access;  Surgeon: Jettie Booze, MD;  Location: [facility];  Service: Cardiovascular;;     FAMILY HISTORY:  Family History  Problem Relation Age of Onset  . Heart disease  Father   . Heart attack Father   . Ulcers Father   . Hypertension Mother   . Melanoma Mother   . Cancer Daughter        melanoma  . Breast cancer Daughter 23  . Stroke Maternal Grandmother   . Hypertension Maternal Grandmother   . Stroke Maternal Grandfather   . Renal Disease Maternal Grandfather        kidney removed  . Hypertension Maternal Grandfather   . Diabetes Paternal Grandmother   . Melanoma Child        Metastatic  . Breast cancer Sister 42  . Hypertension Brother   . Asthma Brother   . Depression Daughter        x2  . Rectal cancer Son   . Coronary artery disease Neg Hx      SOCIAL  HISTORY:  reports that she has quit smoking. She has never used smokeless tobacco. She reports current alcohol use of about 4.0 standard drinks of alcohol per week. She reports that she does not use drugs.  The patient is widowed and lives in [place].  She is accompanied by her daughter. She is active and enjoys gardening.   ALLERGIES: Iodine, Latex, Other, and Statins   MEDICATIONS:  Current Outpatient Medications  Medication Sig Dispense Refill  . amLODipine (NORVASC) 5 MG tablet TAKE 1 TABLET ONCE DAILY. 90 tablet 3  . Ascorbic Acid (VITAMIN C WITH ROSE HIPS) 500 MG tablet Take 500 mg by mouth daily.    [name] aspirin 81 MG EC tablet Take 81 mg by mouth daily.    . Biotin 10000 MCG TABS Take by mouth.    . Cholecalciferol (VITAMIN D3) 125 MCG (5000 UT) CAPS Take 5,000 Units by mouth daily.    . clopidogrel (PLAVIX) 75 MG tablet TAKE 1 TABLET ONCE DAILY. 90 tablet 3  . ezetimibe (ZETIA) 10 MG tablet TAKE 1 TABLET ONCE DAILY. 90 tablet 1  . irbesartan-hydrochlorothiazide (AVALIDE) 300-12.5 MG tablet Take 1 tablet by mouth daily. Please call and schedule follow up appointment. 90 tablet 0  . metoprolol succinate (TOPROL-XL) 50 MG 24 hr tablet TAKE 1 TABLET ONCE DAILY. 90 tablet 3  . nitroGLYCERIN (NITROSTAT) 0.4 MG SL tablet DISSOLVE 1 TABLET UNDER TONGUE EVERY 5 MINUTES AS NEEDED FOR CHEST PAIN. 25 tablet 0  . potassium chloride SA (KLOR-CON) 20 MEQ tablet TAKE 1 TABLET ONCE DAILY. 90 tablet 3  . zinc gluconate 50 MG tablet Take 50 mg by mouth daily.     No current facility-administered medications for this encounter.     REVIEW OF SYSTEMS: On review of systems, the patient reports that she is doing well overall. She has noticed more palpable changes in the breast since her biopsy. She is otherwise doing well without any acute symptoms verbalized.    PHYSICAL EXAM:  Wt Readings from Last 3 Encounters:  03/10/21 144 lb 4 oz (65.4 kg)  12/11/20 142 lb (64.4 kg)  09/09/20 144 lb 4 oz  (65.4 kg)   Temp Readings from Last 3 Encounters:  01/27/21 98.2 F (36.8 C) (Oral)  04/04/20 (!) 97.2 F (36.2 C) (Skin)  12/24/19 (!) 97.5 F (36.4 C) (Oral)   BP Readings from Last 3 Encounters:  03/10/21 110/70  01/27/21 118/70  09/09/20 126/80   Pulse Readings from Last 3 Encounters:  03/10/21 96  01/27/21 66  09/09/20 91    In general this is a well appearing Caucasian female in no acute distress. She's alert and  oriented x4 and appropriate throughout the examination. Cardiopulmonary assessment is negative for acute distress and she exhibits normal effort. Bilateral breast exam is deferred.    ECOG = 1  0 - Asymptomatic (Fully active, able to carry on all predisease activities without restriction)  1 - Symptomatic but completely ambulatory (Restricted in physically strenuous activity but ambulatory and able to carry out work of a light or sedentary nature. For example, light housework, office work)  2 - Symptomatic, <50% in bed during the day (Ambulatory and capable of all self care but unable to carry out any work activities. Up and about more than 50% of waking hours)  3 - Symptomatic, >50% in bed, but not bedbound (Capable of only limited self-care, confined to bed or chair 50% or more of waking hours)  4 - Bedbound (Completely disabled. Cannot carry on any self-care. Totally confined to bed or chair)  5 - Death   Eustace Pen MM, Creech RH, Tormey DC, et al. [phone]). "Toxicity and response criteria of the [facility]". [place] Oncol. 5 (6): 649-55    LABORATORY DATA:  Lab Results  Component Value Date   WBC 9.3 12/24/2019   HGB 11.8 (L) 12/24/2019   HCT 35.6 (L) 12/24/2019   MCV 82.2 12/24/2019   PLT 314 12/24/2019   Lab Results  Component Value Date   NA 141 03/10/2021   K 3.7 03/10/2021   CL 103 03/10/2021   CO2 29 03/10/2021   Lab Results  Component Value Date   ALT 17 03/10/2021   AST 16 03/10/2021   ALKPHOS 85 03/10/2021    BILITOT 0.6 03/10/2021      RADIOGRAPHY: US BREAST LTD UNI LEFT INC AXILLA  Addendum Date: 03/25/2021   ADDENDUM REPORT: 03/25/2021 09:53 ADDENDUM: RIGHT breast ultrasound was also performed. Electronically Signed   By: Nolon Nations M.D.   On: 03/25/2021 09:53   Result Date: 03/25/2021 CLINICAL DATA:  Palpable abnormality in the LEFT breast for 7 weeks. EXAM: DIGITAL DIAGNOSTIC BILATERAL MAMMOGRAM WITH TOMOSYNTHESIS AND CAD; ULTRASOUND LEFT BREAST LIMITED TECHNIQUE: Bilateral digital diagnostic mammography and breast tomosynthesis was performed. The images were evaluated with computer-aided detection.; Targeted ultrasound examination of the left breast was performed COMPARISON:  None. ACR Breast Density Category b: There are scattered areas of fibroglandular density. FINDINGS: RIGHT BREAST: Mammogram: A circumscribed oval mass is identified in the retroareolar region of the RIGHT breast and further evaluated with ultrasound. Coarse, dystrophic calcifications are identified in the RIGHT breast. Mammographic images were processed with CAD. Ultrasound: Targeted ultrasound is performed, showing a circumscribed oval near anechoic mass in the 9 o'clock location of the RIGHT breast 1 centimeter from the nipple measuring 0.5 x 0.4 x 0.3 centimeters. An adjacent similar appearing mass is 0.5 x 0.5 x 0.3 centimeters. No solid mass or areas of acoustic shadowing identified sonographically. LEFT BREAST: Mammogram: An irregular mass is identified in the UPPER-OUTER QUADRANT of the LEFT breast and is associated with faint pleomorphic calcifications. BB marks area of patient's concern which corresponds to the same irregular mass. Mammographic images were processed with CAD. Physical Exam: I palpate discrete small mass in the 3 o'clock location of the LEFT breast. Ultrasound: Targeted ultrasound is performed, showing an irregular hypoechoic mass with posterior acoustic shadowing in the 3 o'clock location of the LEFT  breast 6 centimeters from the nipple. Margins are irregular. Mass measures 1.2 x 1.2 x 1.6 centimeters. Peripheral blood flow is identified on Doppler evaluation of the lesion. Evaluation  of the LEFT axilla is negative for adenopathy. IMPRESSION: 1. Suspicious mass in the 3 o'clock location of the LEFT breast measuring 1.6 centimeters. 2. No LEFT axillary adenopathy. 3. Benign fibrocystic changes in the retroareolar region of the RIGHT breast. RECOMMENDATION: Recommend ultrasound-guided core biopsy of LEFT breast mass. I have discussed the findings and recommendations with the patient. If applicable, a reminder letter will be sent to the patient regarding the next appointment. BI-RADS CATEGORY  4: Suspicious. Electronically Signed: By: Nolon Nations M.D. On: 03/20/2021 16:11   US BREAST LTD UNI RIGHT INC AXILLA  Addendum Date: 03/25/2021   ADDENDUM REPORT: 03/25/2021 09:51 ADDENDUM: RIGHT breast ultrasound was also performed. Electronically Signed   By: Nolon Nations M.D.   On: 03/25/2021 09:51   Result Date: 03/25/2021 CLINICAL DATA:  Palpable abnormality in the LEFT breast for 7 weeks. EXAM: DIGITAL DIAGNOSTIC BILATERAL MAMMOGRAM WITH TOMOSYNTHESIS AND CAD; ULTRASOUND LEFT BREAST LIMITED TECHNIQUE: Bilateral digital diagnostic mammography and breast tomosynthesis was performed. The images were evaluated with computer-aided detection.; Targeted ultrasound examination of the left breast was performed COMPARISON:  None. ACR Breast Density Category b: There are scattered areas of fibroglandular density. FINDINGS: RIGHT BREAST: Mammogram: A circumscribed oval mass is identified in the retroareolar region of the RIGHT breast and further evaluated with ultrasound. Coarse, dystrophic calcifications are identified in the RIGHT breast. Mammographic images were processed with CAD. Ultrasound: Targeted ultrasound is performed, showing a circumscribed oval near anechoic mass in the 9 o'clock location of the RIGHT  breast 1 centimeter from the nipple measuring 0.5 x 0.4 x 0.3 centimeters. An adjacent similar appearing mass is 0.5 x 0.5 x 0.3 centimeters. No solid mass or areas of acoustic shadowing identified sonographically. LEFT BREAST: Mammogram: An irregular mass is identified in the UPPER-OUTER QUADRANT of the LEFT breast and is associated with faint pleomorphic calcifications. BB marks area of patient's concern which corresponds to the same irregular mass. Mammographic images were processed with CAD. Physical Exam: I palpate discrete small mass in the 3 o'clock location of the LEFT breast. Ultrasound: Targeted ultrasound is performed, showing an irregular hypoechoic mass with posterior acoustic shadowing in the 3 o'clock location of the LEFT breast 6 centimeters from the nipple. Margins are irregular. Mass measures 1.2 x 1.2 x 1.6 centimeters. Peripheral blood flow is identified on Doppler evaluation of the lesion. Evaluation of the LEFT axilla is negative for adenopathy. IMPRESSION: 1. Suspicious mass in the 3 o'clock location of the LEFT breast measuring 1.6 centimeters. 2. No LEFT axillary adenopathy. 3. Benign fibrocystic changes in the retroareolar region of the RIGHT breast. RECOMMENDATION: Recommend ultrasound-guided core biopsy of LEFT breast mass. I have discussed the findings and recommendations with the patient. If applicable, a reminder letter will be sent to the patient regarding the next appointment. BI-RADS CATEGORY  4: Suspicious. Electronically Signed: By: Nolon Nations M.D. On: 03/20/2021 16:11   MM DIAG BREAST TOMO BILATERAL  Addendum Date: 03/25/2021   ADDENDUM REPORT: 03/25/2021 09:53 ADDENDUM: RIGHT breast ultrasound was also performed. Electronically Signed   By: Nolon Nations M.D.   On: 03/25/2021 09:53   Result Date: 03/25/2021 CLINICAL DATA:  Palpable abnormality in the LEFT breast for 7 weeks. EXAM: DIGITAL DIAGNOSTIC BILATERAL MAMMOGRAM WITH TOMOSYNTHESIS AND CAD; ULTRASOUND LEFT  BREAST LIMITED TECHNIQUE: Bilateral digital diagnostic mammography and breast tomosynthesis was performed. The images were evaluated with computer-aided detection.; Targeted ultrasound examination of the left breast was performed COMPARISON:  None. ACR Breast Density Category b: There are scattered  areas of fibroglandular density. FINDINGS: RIGHT BREAST: Mammogram: A circumscribed oval mass is identified in the retroareolar region of the RIGHT breast and further evaluated with ultrasound. Coarse, dystrophic calcifications are identified in the RIGHT breast. Mammographic images were processed with CAD. Ultrasound: Targeted ultrasound is performed, showing a circumscribed oval near anechoic mass in the 9 o'clock location of the RIGHT breast 1 centimeter from the nipple measuring 0.5 x 0.4 x 0.3 centimeters. An adjacent similar appearing mass is 0.5 x 0.5 x 0.3 centimeters. No solid mass or areas of acoustic shadowing identified sonographically. LEFT BREAST: Mammogram: An irregular mass is identified in the UPPER-OUTER QUADRANT of the LEFT breast and is associated with faint pleomorphic calcifications. BB marks area of patient's concern which corresponds to the same irregular mass. Mammographic images were processed with CAD. Physical Exam: I palpate discrete small mass in the 3 o'clock location of the LEFT breast. Ultrasound: Targeted ultrasound is performed, showing an irregular hypoechoic mass with posterior acoustic shadowing in the 3 o'clock location of the LEFT breast 6 centimeters from the nipple. Margins are irregular. Mass measures 1.2 x 1.2 x 1.6 centimeters. Peripheral blood flow is identified on Doppler evaluation of the lesion. Evaluation of the LEFT axilla is negative for adenopathy. IMPRESSION: 1. Suspicious mass in the 3 o'clock location of the LEFT breast measuring 1.6 centimeters. 2. No LEFT axillary adenopathy. 3. Benign fibrocystic changes in the retroareolar region of the RIGHT breast.  RECOMMENDATION: Recommend ultrasound-guided core biopsy of LEFT breast mass. I have discussed the findings and recommendations with the patient. If applicable, a reminder letter will be sent to the patient regarding the next appointment. BI-RADS CATEGORY  4: Suspicious. Electronically Signed: By: Nolon Nations M.D. On: 03/20/2021 16:11   MM CLIP PLACEMENT LEFT  Result Date: 03/25/2021 CLINICAL DATA:  Post procedure mammogram for clip placement. EXAM: DIAGNOSTIC LEFT MAMMOGRAM POST ULTRASOUND BIOPSY COMPARISON:  Previous exam(s). FINDINGS: Mammographic images were obtained following ultrasound guided biopsy of a mass in the left breast at 3 o'clock. The biopsy marking clip is in expected position at the site of biopsy. IMPRESSION: Appropriate positioning of the ribbon shaped biopsy marking clip at the site of biopsy in the left breast at 3 o'clock. Final Assessment: Post Procedure Mammograms for Marker Placement Electronically Signed   By: Audie Pinto M.D.   On: 03/25/2021 14:24   [place] LT BREAST BX W LOC DEV 1ST LESION IMG BX SPEC US GUIDE  Addendum Date: 03/26/2021   ADDENDUM REPORT: 03/26/2021 14:50 ADDENDUM: Pathology revealed GRADE I INVASIVE DUCTAL CARCINOMA, DUCTAL CARCINOMA IN SITU of the LEFT breast, 3 o'clock. This was found to be concordant by Dr. Audie Pinto. Pathology results were discussed with the patient by telephone. The patient reported doing well after the biopsy with tenderness at the site. Post biopsy instructions and care were reviewed and questions were answered. The patient was encouraged to call [facility] for any additional concerns. The patient was referred to [facility] on Apr 01, 2021. Pathology results reported by Stacie Acres RN on 03/26/2021. Electronically Signed   By: Audie Pinto M.D.   On: 03/26/2021 14:50   Result Date: 03/26/2021 CLINICAL DATA:   80 year old female presenting for biopsy of a left breast mass. EXAM: ULTRASOUND GUIDED LEFT BREAST CORE NEEDLE BIOPSY COMPARISON:  Previous exam(s). PROCEDURE: I met with the patient and we discussed the procedure of ultrasound-guided biopsy, including benefits and alternatives. We  discussed the high likelihood of a successful procedure. We discussed the risks of the procedure, including infection, bleeding, tissue injury, clip migration, and inadequate sampling. Informed written consent was given. The usual time-out protocol was performed immediately prior to the procedure. Lesion quadrant: Upper outer quadrant Using sterile technique and 1% Lidocaine as local anesthetic, under direct ultrasound visualization, a 14 gauge spring-loaded device was used to perform biopsy of mass in the left breast at 3 o'clock using a lateral approach. At the conclusion of the procedure a ribbon tissue marker clip was deployed into the biopsy cavity. Follow up 2 view mammogram was performed and dictated separately. IMPRESSION: Ultrasound guided biopsy of a mass in the left breast at 3 o'clock. No apparent complications. Electronically Signed: By: Audie Pinto M.D. On: 03/25/2021 14:25       IMPRESSION/PLAN: 1. Stage IA, [id number] grade 1 ER/PR positive invasive ductal carcinoma of the left breast. Dr. Lisbeth Renshaw discusses the pathology findings and reviews the nature of left breast disease. The consensus from the breast conference includes breast conservation with lumpectomy.  The patient has a favorable diagnosis but she is interested in avoiding antiestrogen therapy which was recommended by Dr. Lindi Adie. Given this, she would benefit fromexternal radiotherapy to the breast  to reduce risks of local recurrence. We discussed the risks, benefits, short, and long term effects of radiotherapy, as well as the curative intent, and the patient is interested in proceeding. Dr. Lisbeth Renshaw discusses the delivery and logistics of radiotherapy and  anticipates a course of 4 weeks of radiotherapy. We will see her back a few weeks after surgery and to determine if she's willing to proceed and then at that point we'd plan to proceed with simulation process and anticipate we starting radiotherapy about 4-6 weeks after surgery.    In a visit lasting 60 minutes, greater than 50% of the time was spent face to face reviewing her case, as well as in preparation of, discussing, and coordinating the patient's care.  The above documentation reflects my direct findings during this shared patient visit. Please see the separate note by Dr. Lisbeth Renshaw on this date for the remainder of the patient's plan of care.    Carola Rhine, [facility]    **Disclaimer: This note was dictated with voice recognition software. Similar sounding words can inadvertently be transcribed and this note may contain transcription errors which may not have been corrected upon publication of note.**

## 2021-04-01 NOTE — Progress Notes (Signed)
REFERRING PROVIDER: Nicholas Lose, MD [place],  [place] [id number]  PRIMARY PROVIDER:  [place], Erika G, MD  PRIMARY REASON FOR VISIT:  1. Malignant neoplasm of upper-outer quadrant of left breast in female, estrogen receptor positive ([place])   2. Family history of malignant melanoma   3. Family history of breast cancer   4. Family history of colon cancer     HISTORY OF PRESENT ILLNESS:   Erika Brown, a 80 y.o. female, was seen for a [place] cancer genetics consultation at the request of Dr. Lindi Adie due to a personal and family history of cancer.  Erika Brown presents to clinic today to discuss the possibility of a hereditary predisposition to cancer, to discuss genetic testing, and to further clarify her future cancer risks, as well as potential cancer risks for family members.   In 2022, at the age of 65, Erika Brown was diagnosed with invasive ductal carcinoma and ductal carcinoma in situ of the left breast (ER+/PR+/HER2-). The treatment plan includes breast conserving surgery and anti-estrogens.   Erika Brown also has a personal history of skin cancer.   CANCER HISTORY:  Oncology History  Malignant neoplasm of upper-outer quadrant of left breast in female, estrogen receptor positive ([place])  03/30/2021 Initial Diagnosis   Patient palpated a left breast abnormality for 7 weeks. Diagnostic mammogram and US showed a 1.6cm mass at the 3 o'clock position in the left breast and no left axillary adenopathy. Biopsy showed invasive and in situ ductal carcinoma, grade 1, HER-2 equivocal by IHC, negative by FISH (ratio 1.45). ER/PR+ >95%, Ki67 1%.   04/01/2021 Cancer Staging   Staging form: Breast, AJCC 8th Edition - Clinical stage from 04/01/2021: Stage IA (cT1c, cN0, cM0, G1, ER+, PR+, HER2-) - Signed by Nicholas Lose, MD on 04/01/2021 Stage prefix: Initial diagnosis Histologic grading system: 3 grade system     Past Medical History:  Diagnosis Date  . Anxiety and depression    . Breast cancer ([place])   . CAD (coronary artery disease)    S/P NSTEMI 4/06 EF 60%; Treated with Cypher DES to LAD and RCA with kissing balloon PCI diagonal; Treadmill myoview 10/09 for CP: Walked 7:06. mild ST-T-wave changes in recovery. EF 71%. Very mild reversible defect in base of the inferior wall --> medical rx.  . Cancer ([place])    HISTORY OF SKIN CANCER  . Cataract   . Complication of anesthesia    " DIFFICULT TO WAKE UP "  . Depression   . Family history of breast cancer 04/01/2021  . Family history of colon cancer 04/01/2021  . Family history of malignant melanoma 04/01/2021  . Glucose intolerance (impaired glucose tolerance)   . History of echocardiogram    Echo 11/17: Vigorous LVF, EF 65-70, normal wall motion, borderline diastolic function, systolic bowing of mitral valve without prolapse, mild MR, trivial TR, PASP 20  . History of medication noncompliance   . Hyperlipidemia    Intolerate to multiple statins due to severe myalgias  . Hypertension    Possible white-coat component  . MI (myocardial infarction) ([place])    acute-s/p stent  . Microhematuria   . Psoriasis   . Rapid heart beat   . Vitamin D deficiency     Past Surgical History:  Procedure Laterality Date  . ABDOMINAL HYSTERECTOMY     and anterior repair  . APPENDECTOMY    . bladder tact    . BREAST BIOPSY    . CARDIAC CATHETERIZATION  02/28/2018  .  CATARACT EXTRACTION Bilateral 2017   Dr. Katy Fitch  . CORONARY STENT INTERVENTION N/A 02/28/2018   Procedure: CORONARY STENT INTERVENTION;  Surgeon: Jettie Booze, MD;  Location: [facility];  Service: Cardiovascular;  Laterality: N/A;  . CORONARY STENT PLACEMENT    . CORONARY STENT PLACEMENT  04/02/019  . LEFT HEART CATH AND CORONARY ANGIOGRAPHY N/A 02/28/2018   Procedure: LEFT HEART CATH AND CORONARY ANGIOGRAPHY;  Surgeon: Jettie Booze, MD;  Location: [facility];  Service: Cardiovascular;  Laterality: N/A;  . MOHS SURGERY  9/14   BCC.  [place]  . TONSILLECTOMY AND ADENOIDECTOMY    . ULTRASOUND GUIDANCE FOR VASCULAR ACCESS  02/28/2018   Procedure: Ultrasound Guidance For Vascular Access;  Surgeon: Jettie Booze, MD;  Location: [facility];  Service: Cardiovascular;;    Social History   Socioeconomic History  . Marital status: Widowed    Spouse name: Not on file  . Number of children: 7  . Years of education: Not on file  . Highest education level: Not on file  Occupational History  . Occupation: Works at home, Charity fundraiser, [place] Use  . Smoking status: Former Research scientist (life sciences)  . Smokeless tobacco: Never Used  . Tobacco comment: only a few times  Vaping Use  . Vaping Use: Never used  Substance and Sexual Activity  . Alcohol use: Yes    Alcohol/week: 4.0 standard drinks    Types: 4 Glasses of wine per week  . Drug use: No  . Sexual activity: Not Currently    Birth control/protection: Abstinence, Surgical    Comment: [facility]  Other Topics Concern  . Not on file  Social History Narrative   Lives in [place] with her husband   Seven kids   Plays tennis   Very good card player   Social Determinants of Health   Financial Resource Strain: Low Risk   . Difficulty of Paying Living Expenses: Not hard at all  Food Insecurity: No Food Insecurity  . Worried About Charity fundraiser in the Last Year: Never true  . Ran Out of Food in the Last Year: Never true  Transportation Needs: No Transportation Needs  . Lack of Transportation (Medical): No  . Lack of Transportation (Non-Medical): No  Physical Activity: Inactive  . Days of Exercise per Week: 0 days  . Minutes of Exercise per Session: 0 min  Stress: No Stress Concern Present  . Feeling of Stress : Not at all  Social Connections: Moderately Integrated  . Frequency of Communication with Friends and Family: More than three times a week  . Frequency of Social Gatherings with Friends and Family: More than three times a week  . Attends Religious  Services: More than 4 times per year  . Active Member of Clubs or Organizations: Yes  . Attends Archivist Meetings: More than 4 times per year  . Marital Status: Widowed     FAMILY HISTORY:  We obtained a detailed, 4-generation family history.  Significant diagnoses are listed below: Family History  Problem Relation Age of Onset  . Melanoma Mother        mets? bottom of foot; dx 80s  . Melanoma Daughter        metastatic; left arm; d. 3  . Breast cancer Sister 77       mets  . Lung cancer Sister        dx 72s  . Breast cancer Daughter 53  .  Colon cancer Son 55    Ms. Tootle is unaware of previous family history of genetic testing for hereditary cancer risks besides that mentioned above. Patient's maternal ancestors are of [place] descent, and paternal ancestors are of [place] descent. There is no reported Ashkenazi Jewish ancestry. There is no known consanguinity.  GENETIC COUNSELING ASSESSMENT: Erika Brown is a 80 y.o. female with a personal and family history of cancer which is somewhat suggestive of a hereditary cancer syndrome and predisposition to cancer given the presence of related cancers in the family and cancers at a young age. We, therefore, discussed and recommended the following at today's visit.   DISCUSSION: We discussed that 5 - 10% of cancer is hereditary, with most cases of hereditary breast cancer associated with mutations in BRCA1/2.  Most hereditary colon cancer is associated with mutations in the Lynch syndrome genes. There are other genes that can be associated with hereditary breast, colon, or melanoma cancer syndromes. .  We discussed that testing is beneficial for several reasons including knowing how to follow individuals after completing their treatment and understanding if other family members could be at risk for cancer and allowing them to undergo genetic testing.   We reviewed the characteristics, features and inheritance patterns of hereditary  cancer syndromes. We also discussed genetic testing, including the appropriate family members to test, the process of testing, insurance coverage and turn-around-time for results. We discussed the implications of a negative, positive, carrier and/or variant of uncertain significant result. We recommended Erika Brown pursue genetic testing for a panel that includes genes associated with breast, colon, and skin cancer.   The CancerNext-Expanded gene panel offered by [facility] and includes sequencing, rearrangement, and RNA analysis for the following 77 genes: AIP, ALK, APC, ATM, AXIN2, BAP1, BARD1, BLM, BMPR1A, BRCA1, BRCA2, BRIP1, CDC73, CDH1, CDK4, CDKN1B, CDKN2A, CHEK2, CTNNA1, DICER1, FANCC, FH, FLCN, GALNT12, KIF1B, LZTR1, MAX, MEN1, MET, MLH1, MSH2, MSH3, MSH6, MUTYH, NBN, NF1, NF2, NTHL1, PALB2, PHOX2B, PMS2, POT1, PRKAR1A, PTCH1, PTEN, RAD51C, RAD51D, RB1, RECQL, RET, SDHA, SDHAF2, SDHB, SDHC, SDHD, SMAD4, SMARCA4, SMARCB1, SMARCE1, STK11, SUFU, TMEM127, TP53, TSC1, TSC2, VHL and XRCC2 (sequencing and deletion/duplication); EGFR, EGLN1, HOXB13, KIT, MITF, PDGFRA, POLD1, and POLE (sequencing only); EPCAM and GREM1 (deletion/duplication only).   Based on Erika Brown personal and family history of breast cancer, she meets medical criteria for genetic testing. Despite that she meets criteria, she may still have an out of pocket cost. We discussed that if her out of pocket cost for testing is over $100, the laboratory will call to discuss self-pay options and patient pay assistance programs.   PLAN: After considering the risks, benefits, and limitations, Erika Brown provided informed consent to pursue genetic testing and the blood sample was sent to [facility] for analysis of the CancerNext-Expanded +RNAinsight Panel. Results should be available within approximately 3 weeks' time, at which point they will be disclosed by telephone to Erika Brown, as will any additional recommendations warranted by  these results. Erika Brown will receive a summary of her genetic counseling visit and a copy of her results once available. This information will also be available in Epic.   Lastly, we encouraged Erika Brown to remain in contact with cancer genetics annually so that we can continuously update the family history and inform her of any changes in cancer genetics and testing that may be of benefit for this family.   Erika Brown questions were answered to her satisfaction today. Our contact information was provided should additional  questions or concerns arise. Thank you for the referral and allowing [place] to share in the care of your patient.   Erika Brown M. Joette Catching, [place], [facility] Genetic Counselor [email or web address] (P) [phone]  The patient was seen for a total of 20 minutes in face-to-face genetic counseling.  Drs. Magrinat, Lindi Adie and/or Burr Medico were available to discuss this case as needed.    _______________________________________________________________________ For Office Staff:  Number of people involved in session: 1 Was an Intern/ student involved with case: no

## 2021-04-01 NOTE — Assessment & Plan Note (Signed)
04/17/2021:Patient palpated a left breast abnormality for 7 weeks. Diagnostic mammogram and US showed a 1.6cm mass at the 3 o'clock position in the left breast and no left axillary adenopathy. Biopsy showed invasive and in situ ductal carcinoma, grade 1, HER-2 equivocal by IHC, negative by FISH (ratio 1.45). ER/PR+ >95%, Ki67 1%.  Pathology and radiology counseling: Discussed with the patient, the details of pathology including the type of breast cancer,the clinical staging, the significance of ER, PR and HER-2/neu receptors and the implications for treatment. After reviewing the pathology in detail, we proceeded to discuss the different treatment options between surgery, radiation, and antiestrogen therapies.  Treatment plan: 1.  Breast conserving surgery 2. followed by adjuvant antiestrogen therapy Radiation oncology did not feel that she would benefit from adjuvant radiation.  Genetic testing  Return to clinic after surgery to discuss final pathology report

## 2021-04-02 ENCOUNTER — Telehealth: Payer: Self-pay | Admitting: *Deleted

## 2021-04-02 ENCOUNTER — Encounter: Payer: Self-pay | Admitting: *Deleted

## 2021-04-02 NOTE — Telephone Encounter (Signed)
   [facility] Pre-operative Risk Assessment    Patient Name: Erika Brown  DOB: January 16, 1941  MRN: [id number]   HEARTCARE STAFF: - Please ensure there is not already an duplicate clearance open for this procedure. - Under Visit Info/Reason for Call, type in Other and utilize the format Clearance MM/DD/YY or Clearance TBD. Do not use dashes or single digits. - If request is for dental extraction, please clarify the # of teeth to be extracted.  Request for surgical clearance: URGENT PER CLEARANCE REQUEST  1. What type of surgery is being performed? LEFT BREAST SEED GUIDED LUMPECTOMY FOR LEFT BREAST CANCER   2. When is this surgery scheduled? TBD   3. What type of clearance is required (medical clearance vs. Pharmacy clearance to hold med vs. Both)? MEDICAL  4. Are there any medications that need to be held prior to surgery and how long? PLAVIX    5. Practice name and name of physician performing surgery? [facility]; DR. Rodman [place]   6. What is the office phone number? [phone]   7.   What is the office fax number? [place], CMA  8.   Anesthesia type (None, local, MAC, general) ? GENERAL   Julaine Hua 04/02/2021, 4:03 PM  _________________________________________________________________   (provider comments below)

## 2021-04-03 NOTE — Telephone Encounter (Signed)
   Name: EVALYN SHULTIS  DOB: 1941-02-18  MRN: [id number]   Primary Cardiologist: Larae Grooms, MD  Chart reviewed as part of pre-operative protocol coverage. Patient was contacted 04/03/2021 in reference to pre-operative risk assessment for pending surgery as outlined below.  AMYBETH SIEG was last seen on 07/22/20 by Dr. Irish Lack.  Since that day, LAROSA RHINES has done well from a cardiac standpoint. She can easily complete 4 METs without anginal complaints   Therefore, based on ACC/AHA guidelines, the patient would be at acceptable risk for the planned procedure without further cardiovascular testing.   The patient was advised that if she develops new symptoms prior to surgery to contact our office to arrange for a follow-up visit, and she verbalized understanding.  Per Dr. Irish Lack, patient can hold plavix 5 days prior to her upcoming lumpectomy with plans to restart plavix when cleared to do so by her surgeon.  I will route this recommendation to the requesting party via Epic fax function and remove from pre-op pool. Please call with questions.  [name], PA-C 04/03/2021, 2:22 PM

## 2021-04-03 NOTE — Telephone Encounter (Signed)
Ok to hold plavix 5 days prior to breast surgery.

## 2021-04-03 NOTE — Telephone Encounter (Signed)
    Erika Brown DOB:  [birth year]  MRN:  [id number]   Primary Cardiologist: Larae Grooms, MD  Chart reviewed as part of pre-operative protocol coverage.   Patient has an extensive CAD history with stenting to all 3 major vessels in 2019.   Now with recent breast cancer diagnosis with plans for lumpectomy.   Dr. Irish Lack - can you comment on recommendations for holding plavix? Please route your response back to P CV DIV PREOP. Thank you!  [name], PA-C 04/03/2021, 10:19 AM

## 2021-04-06 ENCOUNTER — Encounter: Payer: Self-pay | Admitting: *Deleted

## 2021-04-06 ENCOUNTER — Telehealth: Payer: Self-pay | Admitting: *Deleted

## 2021-04-06 ENCOUNTER — Other Ambulatory Visit: Payer: Self-pay | Admitting: General Surgery

## 2021-04-06 DIAGNOSIS — C50412 Malignant neoplasm of upper-outer quadrant of left female breast: Secondary | ICD-10-CM

## 2021-04-06 DIAGNOSIS — Z170 Estrogen receptor positive status [ER+]: Secondary | ICD-10-CM

## 2021-04-06 NOTE — Telephone Encounter (Signed)
Spoke to pt concerning [facility] from 5.4.22. Denies questions or concerns regarding dx or treatment care plan.  Pt will call back when ready to schedule with Dr. Lindi Adie post op, as would like daughter to attend.

## 2021-04-09 ENCOUNTER — Other Ambulatory Visit: Payer: Self-pay | Admitting: Interventional Cardiology

## 2021-04-15 ENCOUNTER — Encounter: Payer: Self-pay | Admitting: Genetic Counselor

## 2021-04-15 ENCOUNTER — Ambulatory Visit: Payer: Self-pay | Admitting: Genetic Counselor

## 2021-04-15 ENCOUNTER — Telehealth: Payer: Self-pay | Admitting: Genetic Counselor

## 2021-04-15 ENCOUNTER — Encounter: Payer: Self-pay | Admitting: General Practice

## 2021-04-15 DIAGNOSIS — Z1379 Encounter for other screening for genetic and chromosomal anomalies: Secondary | ICD-10-CM

## 2021-04-15 DIAGNOSIS — C50412 Malignant neoplasm of upper-outer quadrant of left female breast: Secondary | ICD-10-CM

## 2021-04-15 DIAGNOSIS — Z803 Family history of malignant neoplasm of breast: Secondary | ICD-10-CM

## 2021-04-15 DIAGNOSIS — Z808 Family history of malignant neoplasm of other organs or systems: Secondary | ICD-10-CM

## 2021-04-15 DIAGNOSIS — Z800 Family history of malignant neoplasm of digestive organs: Secondary | ICD-10-CM

## 2021-04-15 DIAGNOSIS — Z170 Estrogen receptor positive status [ER+]: Secondary | ICD-10-CM

## 2021-04-15 NOTE — Progress Notes (Signed)
[facility] Spiritual Care Note  Attempted follow-up pastoral call as planned; left voicemail of support, encouraging return call.   [place], [place], [facility] Pager [phone] Voicemail [phone]

## 2021-04-15 NOTE — Progress Notes (Signed)
HPI:  Ms. Livingston was previously seen in the [facility] due to a personal and family history of cancer and concerns regarding a hereditary predisposition to cancer. Please refer to our prior cancer genetics clinic note for more information regarding our discussion, assessment and recommendations, at the time. Ms. Schelling recent genetic test results were disclosed to her, as were recommendations warranted by these results. These results and recommendations are discussed in more detail below.  CANCER HISTORY:  In 2022, at the age of 80, Ms. Kruckenberg was diagnosed with invasive ductal carcinoma and ductal carcinoma in situ of the left breast (ER+/PR+/HER2-). The treatment plan includes breast conserving surgery and anti-estrogens.   Ms. Crownover also has a personal history of skin cancer.   Oncology History  Malignant neoplasm of upper-outer quadrant of left breast in female, estrogen receptor positive ([place])  03/30/2021 Initial Diagnosis   Patient palpated a left breast abnormality for 7 weeks. Diagnostic mammogram and US showed a 1.6cm mass at the 3 o'clock position in the left breast and no left axillary adenopathy. Biopsy showed invasive and in situ ductal carcinoma, grade 1, HER-2 equivocal by IHC, negative by FISH (ratio 1.45). ER/PR+ >95%, Ki67 1%.   04/01/2021 Cancer Staging   Staging form: Breast, AJCC 8th Edition - Clinical stage from 04/01/2021: Stage IA (cT1c, cN0, cM0, G1, ER+, PR+, HER2-) - Signed by Nicholas Lose, MD on 04/01/2021 Stage prefix: Initial diagnosis Histologic grading system: 3 grade system   04/13/2021 Genetic Testing   Negative hereditary cancer genetic testing: no pathogenic variants detected in [facility] CancerNext-Expanded +RNAinsight Panel.  Variant of uncertain significance detected in MSH6 at  p.T1100M (c.3299C>T).  The report date is Apr 13, 2021.    The CancerNext-Expanded gene panel offered by [facility] and includes sequencing, rearrangement, and RNA  analysis for the following 77 genes: AIP, ALK, APC, ATM, AXIN2, BAP1, BARD1, BLM, BMPR1A, BRCA1, BRCA2, BRIP1, CDC73, CDH1, CDK4, CDKN1B, CDKN2A, CHEK2, CTNNA1, DICER1, FANCC, FH, FLCN, GALNT12, KIF1B, LZTR1, MAX, MEN1, MET, MLH1, MSH2, MSH3, MSH6, MUTYH, NBN, NF1, NF2, NTHL1, PALB2, PHOX2B, PMS2, POT1, PRKAR1A, PTCH1, PTEN, RAD51C, RAD51D, RB1, RECQL, RET, SDHA, SDHAF2, SDHB, SDHC, SDHD, SMAD4, SMARCA4, SMARCB1, SMARCE1, STK11, SUFU, TMEM127, TP53, TSC1, TSC2, VHL and XRCC2 (sequencing and deletion/duplication); EGFR, EGLN1, HOXB13, KIT, MITF, PDGFRA, POLD1, and POLE (sequencing only); EPCAM and GREM1 (deletion/duplication only).      FFAMILY HISTORY:  We obtained a detailed, 4-generation family history.  Significant diagnoses are listed below:      Family History  Problem Relation Age of Onset  . Melanoma Mother        mets? bottom of foot; dx 80s  . Melanoma Daughter        metastatic; left arm; d. 46  . Breast cancer Sister 56       mets  . Lung cancer Sister        dx 1s  . Breast cancer Daughter 92  . Colon cancer Son 103    Ms. Corsello is unaware of previous family history of genetic testing for hereditary cancer risks besides that mentioned above. Patient's maternal ancestors are of [place] descent, and paternal ancestors are of [place] descent. There is no reported Ashkenazi Jewish ancestry. There is no known consanguinity.   GENETIC TEST RESULTS: Genetic testing reported out on Apr 13, 2021.  The [facility] CancerNext-Expanded +RNAinsight Panel found no pathogenic mutations. The CancerNext-Expanded gene panel offered by [facility] and includes sequencing, rearrangement, and RNA analysis for the following  77 genes: AIP, ALK, APC, ATM, AXIN2, BAP1, BARD1, BLM, BMPR1A, BRCA1, BRCA2, BRIP1, CDC73, CDH1, CDK4, CDKN1B, CDKN2A, CHEK2, CTNNA1, DICER1, FANCC, FH, FLCN, GALNT12, KIF1B, LZTR1, MAX, MEN1, MET, MLH1, MSH2, MSH3, MSH6, MUTYH, NBN, NF1, NF2, NTHL1, PALB2, PHOX2B, PMS2, POT1,  PRKAR1A, PTCH1, PTEN, RAD51C, RAD51D, RB1, RECQL, RET, SDHA, SDHAF2, SDHB, SDHC, SDHD, SMAD4, SMARCA4, SMARCB1, SMARCE1, STK11, SUFU, TMEM127, TP53, TSC1, TSC2, VHL and XRCC2 (sequencing and deletion/duplication); EGFR, EGLN1, HOXB13, KIT, MITF, PDGFRA, POLD1, and POLE (sequencing only); EPCAM and GREM1 (deletion/duplication only).   The test report has been scanned into EPIC and is located under the Molecular Pathology section of the Results Review tab.  A portion of the result report is included below for reference.     We discussed with Ms. Minich that because current genetic testing is not perfect, it is possible there may be a gene mutation in one of these genes that current testing cannot detect, but that chance is small.  We also discussed, that there could be another gene that has not yet been discovered, or that we have not yet tested, that is responsible for the cancer diagnoses in the family. It is also possible there is a hereditary cause for the cancer in the family that Ms. Howson did not inherit and therefore was not identified in her testing.  Therefore, it is important to remain in touch with cancer genetics in the future so that we can continue to offer Ms. Winterton the most up to date genetic testing.   Genetic testing did identify a variant of uncertain significance (VUS) was identified in the MSH6 gene called c.3299C>T (p.T1100M).  At this time, it is unknown if this variant is associated with increased cancer risk or if this is a normal finding, but most variants such as this get reclassified to being inconsequential. It should not be used to make medical management decisions. With time, we suspect the lab will determine the significance of this variant, if any. If we do learn more about it, we will try to contact Ms. Hungate to discuss it further. However, it is important to stay in touch with [place] periodically and keep the address and phone number up to date.  ADDITIONAL GENETIC TESTING: We  discussed with Ms. Basinski that her genetic testing was fairly extensive.  If there are genes identified to increase cancer risk that can be analyzed in the future, we would be happy to discuss and coordinate this testing at that time.    CANCER SCREENING RECOMMENDATIONS: Ms. Natzke test result is considered negative (normal).  This means that we have not identified a hereditary cause for her personal history of cancer at this time. Most cancers happen by chance and this negative test suggests that her cancer may fall into this category.    While reassuring, this does not definitively rule out a hereditary predisposition to cancer. It is still possible that there could be genetic mutations that are undetectable by current technology. There could be genetic mutations in genes that have not been tested or identified to increase cancer risk.  Therefore, it is recommended she continue to follow the cancer management and screening guidelines provided by her oncology and primary healthcare provider.   An individual's cancer risk and medical management are not determined by genetic test results alone. Overall cancer risk assessment incorporates additional factors, including personal medical history, family history, and any available genetic information that may result in a personalized plan for cancer prevention and surveillance  RECOMMENDATIONS FOR  FAMILY MEMBERS:  Individuals in this family might be at some increased risk of developing cancer, over the general population risk, simply due to the family history of cancer.  We recommended women in this family have a yearly mammogram beginning at age 60, or 3 years younger than the earliest onset of cancer, an annual clinical breast exam, and perform monthly breast self-exams. Women in this family should also have a gynecological exam as recommended by their primary provider.   First degree relatives of those with colon cancer should receive colonoscopies beginning  at age 64, or 10 years prior to the earliest diagnosis of colon cancer in the family, and receive colonoscopies at least every 5 years, or as recommended by their gastroenterologist.    It is also possible there is a hereditary cause for the cancer in Ms. Stawicki's family that she did not inherit and therefore was not identified in her.  Based on Ms. Rhudy's family history, we recommended her son, who was diagnosed with colon cancer at age 47, have genetic counseling and testing if has not had genetic testing already. Ms. Hogge will let us know if we can be of any assistance in coordinating genetic counseling and/or testing for this family member.   FOLLOW-UP: Lastly, we discussed with Ms. Rupnow that cancer genetics is a rapidly advancing field and it is possible that new genetic tests will be appropriate for her and/or her family members in the future. We encouraged her to remain in contact with cancer genetics on an annual basis so we can update her personal and family histories and let her know of advances in cancer genetics that may benefit this family.   Our contact number was provided. Ms. Boehning questions were answered to her satisfaction, and she knows she is welcome to call us at anytime with additional questions or concerns.     Laranda Burkemper M. Joette Catching, [place], [facility] Genetic Counselor [email or web address] (P) [phone]

## 2021-04-15 NOTE — Telephone Encounter (Signed)
Revealed negative genetic testing and variant of uncertain significance in MSH6.  Discussed that we do not know why she has breast cancer or why there is cancer in the family. It could be sporadic/familial, due to a different gene that we are not testing, or maybe our current technology may not be able to pick something up.  It will be important for her to keep in contact with genetics to keep up with whether additional testing may be needed.

## 2021-04-20 ENCOUNTER — Ambulatory Visit: Payer: Medicare HMO | Admitting: Interventional Cardiology

## 2021-04-28 NOTE — Progress Notes (Signed)
Cardiology Office Note   Date:  05/01/2021   ID:  Erika, Brown March 12, 1941, MRN [id number]  PCP:  [place], Betty G, MD    No chief complaint on file.  CAD  Wt Readings from Last 3 Encounters:  05/01/21 145 lb 3.2 oz (65.9 kg)  04/01/21 145 lb 6.4 oz (66 kg)  03/10/21 144 lb 4 oz (65.4 kg)       History of Present Illness: Erika Brown is a 80 y.o. female  with CAD whois aformer patient of Dr. Saunders Revel with coronary artery disease status post Cypher DES to the LAD and Cypher DES to the RCA as well as balloon angioplasty of the diagonal in 03/05/2005 in the setting of non-ST elevation myocardial infarction, hypertension, hyperlipidemia. Most recently, she underwent cardiac catheterization inApril 2018/03/05 after an abnormal stress test. This demonstrated in-stent restenosis in the LAD as well as severe stenosis in the LCx and RCA. She underwent multivessel stenting with a DES to the proximal-mid LAD, DES to the mid LCx and DES to the proximal-mid RCA. Last seen by Dr. Irving Burton July 2019. She complained of some palpitations and her beta-blocker dose was adjusted. They also discussed +/- ezetimibe therapy. She has intolerance to multiple statins and is not inclined to try PCSK-9 inhibitor therapy.  She had three vessel stenting in April 2019.   SHewas trying to eat healthier. Shewasdecreasing meat intake. She started Zetia in October 06, 2023.  Her husband died from [place] in early March 05, 2020. He was diagnosed with COVID in early Jan. She got COVID from him at a nursing home. She was hospitalized for 4 days. She received steroids, remdesivir.   In Feb 2021, it was noted: "She feels that she is having nosebleeds. HR seems to be higher in general. Even now, while she is talking, HR is 103. Using oxygen at night, 2L. "   I recommended a humidifier to help.   Son has long [place].    She was diagnosed with Breast cancer.  Lumpectomy is planned. She does not want to do radiation.  She is  worried about cardiac side effects as well from the oral meds (anti-estrogen therapy).  We discussed a book called "Being Mortal."  Denies : Chest pain. Dizziness. Leg edema. Nitroglycerin use. Orthopnea. Palpitations. Paroxysmal nocturnal dyspnea. Shortness of breath. Syncope.   Gardening is most strenuous exercise along with going up stairs.  She does sometimes forget to stay hydrated while gardening.   Past Medical History:  Diagnosis Date  . Anxiety and depression   . Breast cancer ([place])   . Breast cancer ([place])   . CAD (coronary artery disease)    S/P NSTEMI 4/06 EF 60%; Treated with Cypher DES to LAD and RCA with kissing balloon PCI diagonal; Treadmill myoview 10/09 for CP: Walked 7:06. mild ST-T-wave changes in recovery. EF 71%. Very mild reversible defect in base of the inferior wall --> medical rx.  . Cancer ([place])    HISTORY OF SKIN CANCER  . Cataract   . Complication of anesthesia    " DIFFICULT TO WAKE UP "  . Depression   . Family history of breast cancer 04/01/2021  . Family history of colon cancer 04/01/2021  . Family history of malignant melanoma 04/01/2021  . Glucose intolerance (impaired glucose tolerance)   . History of echocardiogram    Echo 11/17: Vigorous LVF, EF 65-70, normal wall motion, borderline diastolic function, systolic bowing of mitral valve without prolapse, mild MR, trivial TR, PASP 20  .  History of medication noncompliance   . Hyperlipidemia    Intolerate to multiple statins due to severe myalgias  . Hypertension    Possible white-coat component  . MI (myocardial infarction) ([place])    acute-s/p stent  . Microhematuria   . Psoriasis   . Rapid heart beat   . Vitamin D deficiency     Past Surgical History:  Procedure Laterality Date  . ABDOMINAL HYSTERECTOMY     and anterior repair  . APPENDECTOMY    . bladder tact    . BREAST BIOPSY    . CARDIAC CATHETERIZATION  02/28/2018  . CATARACT EXTRACTION Bilateral 2017   Dr. Katy Fitch  . CORONARY STENT  INTERVENTION N/A 02/28/2018   Procedure: CORONARY STENT INTERVENTION;  Surgeon: Jettie Booze, MD;  Location: [facility];  Service: Cardiovascular;  Laterality: N/A;  . CORONARY STENT PLACEMENT    . CORONARY STENT PLACEMENT  04/02/019  . LEFT HEART CATH AND CORONARY ANGIOGRAPHY N/A 02/28/2018   Procedure: LEFT HEART CATH AND CORONARY ANGIOGRAPHY;  Surgeon: Jettie Booze, MD;  Location: [facility];  Service: Cardiovascular;  Laterality: N/A;  . MOHS SURGERY  9/14   BCC.  [facility]  . TONSILLECTOMY AND ADENOIDECTOMY    . ULTRASOUND GUIDANCE FOR VASCULAR ACCESS  02/28/2018   Procedure: Ultrasound Guidance For Vascular Access;  Surgeon: Jettie Booze, MD;  Location: [facility];  Service: Cardiovascular;;     Current Outpatient Medications  Medication Sig Dispense Refill  . amLODipine (NORVASC) 5 MG tablet TAKE 1 TABLET ONCE DAILY. 90 tablet 3  . Ascorbic Acid (VITAMIN C WITH ROSE HIPS) 500 MG tablet Take 500 mg by mouth daily.    [name] aspirin 81 MG EC tablet Take 81 mg by mouth daily.    . Biotin 10000 MCG TABS Take by mouth.    . Cholecalciferol (VITAMIN D3) 125 MCG (5000 UT) CAPS Take 5,000 Units by mouth daily.    . clopidogrel (PLAVIX) 75 MG tablet TAKE 1 TABLET ONCE DAILY. 90 tablet 3  . ezetimibe (ZETIA) 10 MG tablet TAKE 1 TABLET ONCE DAILY. 90 tablet 1  . irbesartan-hydrochlorothiazide (AVALIDE) 300-12.5 MG tablet Take 1 tablet by mouth daily. Please keep upcoming appt with Dr. Irish Lack in May 2022 before anymore refills. Thank you 90 tablet 0  . metoprolol succinate (TOPROL-XL) 50 MG 24 hr tablet TAKE 1 TABLET ONCE DAILY. 90 tablet 3  . nitroGLYCERIN (NITROSTAT) 0.4 MG SL tablet DISSOLVE 1 TABLET UNDER TONGUE EVERY 5 MINUTES AS NEEDED FOR CHEST PAIN. 25 tablet 0  . potassium chloride SA (KLOR-CON) 20 MEQ tablet TAKE 1 TABLET ONCE DAILY. 90 tablet 3  . zinc gluconate 50 MG tablet Take 50 mg by mouth daily.     No current facility-administered  medications for this visit.    Allergies:   Iodine, Latex, Other, and Statins    Social History:  The patient  reports that she has quit smoking. She has never used smokeless tobacco. She reports current alcohol use of about 4.0 standard drinks of alcohol per week. She reports that she does not use drugs.   Family History:  The patient's family history includes Asthma in her brother; Breast cancer (age of onset: 40) in her daughter; Breast cancer (age of onset: 49) in her sister; Colon cancer (age of onset: 25) in her son; Depression in her daughter; Diabetes in her paternal grandmother; Heart attack in her father; Heart disease in her father; Hypertension in her  brother, maternal grandfather, maternal grandmother, and mother; Lung cancer in her sister; Melanoma in her daughter and mother; Renal Disease in her maternal grandfather; Stroke in her maternal grandfather and maternal grandmother; Ulcers in her father.    ROS:  Please see the history of present illness.   Otherwise, review of systems are positive for rare episodes of vomiting.   All other systems are reviewed and negative.    PHYSICAL EXAM: VS:  BP 136/72   Pulse 84   Ht 5' 4.75" (1.645 m)   Wt 145 lb 3.2 oz (65.9 kg)   LMP 11/29/1974   SpO2 96%   BMI 24.35 kg/m  , BMI Body mass index is 24.35 kg/m. GEN: Well nourished, well developed, in no acute distress  HEENT: normal  Neck: no JVD, carotid bruits, or masses Cardiac: RRR; no murmurs, rubs, or gallops,no edema  Respiratory:  clear to auscultation bilaterally, normal work of breathing GI: soft, nontender, nondistended, + BS MS: no deformity or atrophy  Skin: warm and dry, no rash Neuro:  Strength and sensation are intact Psych: euthymic mood, full affect   EKG:   The ekg ordered today demonstrates NSR, nonspecific ST segment changes   Recent Labs: 04/01/2021: ALT 18; BUN 20; Creatinine 0.81; Hemoglobin 12.5; Platelet Count 229; Potassium 4.1; Sodium 139   Lipid  Panel    Component Value Date/Time   CHOL 199 03/10/2021 0837   CHOL 207 (H) 01/26/2019 0921   TRIG 91.0 03/10/2021 0837   HDL 65.40 03/10/2021 0837   HDL 87 01/26/2019 0921   CHOLHDL 3 03/10/2021 0837   VLDL 18.2 03/10/2021 0837   LDLCALC 115 (H) 03/10/2021 0837   LDLCALC 108 (H) 01/26/2019 0921   LDLDIRECT 121.7 09/08/2007 0852     Other studies Reviewed: Additional studies/ records that were reviewed today with results demonstrating: Oncology notes .   ASSESSMENT AND PLAN:  1. CAD: Continue aggressive secondary prevention.  Holding clopidogrel 5 days prior to surgery on 6/23. She will continue aspirin.  No cardiac testing needed prior to surgery.  She walks up and down stairs without any cardiac symptoms. 2. Preoperative cardiovascular exam: She needs surgery with Dr. Donne Hazel for breast cancer. 3. Hyperlipidemia: She has been intolerant of statins and declined PCSK9 inhibitor in the past.  Continue Zetia.  Whole food, plant-based diet. 4. Hypertension: Low-salt diet.  Avoid processed foods. 5. Mitral regurgitation: No CHF symptoms.  Only trivial in December 2020.  Stay well-hydrated.   Current medicines are reviewed at length with the patient today.  The patient concerns regarding her medicines were addressed.  The following changes have been made: She will restart her clopidogrel the day after her lumpectomy.  Labs/ tests ordered today include:  No orders of the defined types were placed in this encounter.   Recommend 150 minutes/week of aerobic exercise Low fat, low carb, high fiber diet recommended  Disposition:   FU in 6 months   Signed, Larae Grooms, MD  05/01/2021 9:57 AM    [facility] [place], [place], [place]  [address] Phone: [phone]; Fax: [phone]

## 2021-05-01 ENCOUNTER — Encounter: Payer: Self-pay | Admitting: Interventional Cardiology

## 2021-05-01 ENCOUNTER — Other Ambulatory Visit: Payer: Self-pay

## 2021-05-01 ENCOUNTER — Ambulatory Visit: Payer: Medicare HMO | Admitting: Interventional Cardiology

## 2021-05-01 VITALS — BP 136/72 | HR 84 | Ht 64.75 in | Wt 145.2 lb

## 2021-05-01 DIAGNOSIS — E785 Hyperlipidemia, unspecified: Secondary | ICD-10-CM | POA: Diagnosis not present

## 2021-05-01 DIAGNOSIS — I340 Nonrheumatic mitral (valve) insufficiency: Secondary | ICD-10-CM

## 2021-05-01 DIAGNOSIS — I2510 Atherosclerotic heart disease of native coronary artery without angina pectoris: Secondary | ICD-10-CM

## 2021-05-01 DIAGNOSIS — I10 Essential (primary) hypertension: Secondary | ICD-10-CM

## 2021-05-01 DIAGNOSIS — Z01810 Encounter for preprocedural cardiovascular examination: Secondary | ICD-10-CM | POA: Diagnosis not present

## 2021-05-01 MED ORDER — NITROGLYCERIN 0.4 MG SL SUBL: 0.4000 mg | SUBLINGUAL_TABLET | SUBLINGUAL | 1 refills | Status: DC | PRN

## 2021-05-01 NOTE — Patient Instructions (Signed)
Medication Instructions:  Your provider recommends that you continue on your current medications as directed. Please refer to the Current Medication list given to you today.   *If you need a refill on your cardiac medications before your next appointment, please call your pharmacy*  Follow-Up: At [facility], you and your health needs are our priority.  As part of our continuing mission to provide you with exceptional heart care, we have created designated Provider Care Teams.  These Care Teams include your primary Cardiologist (physician) and Advanced Practice Providers (APPs -  Physician Assistants and Nurse Practitioners) who all work together to provide you with the care you need, when you need it. Your next appointment:   6 month(s) The format for your next appointment:   In Person Provider:   You may see Larae Grooms, MD or one of the following Advanced Practice Providers on your designated Care Team:    Melina Copa, PA-C  Ermalinda Barrios, PA-C

## 2021-05-18 ENCOUNTER — Other Ambulatory Visit (HOSPITAL_COMMUNITY)
Admission: RE | Admit: 2021-05-18 | Discharge: 2021-05-18 | Disposition: A | Discharge status: Home / Self Care | Payer: Medicare HMO | Source: Ambulatory Visit | Attending: General Surgery | Admitting: General Surgery

## 2021-05-18 DIAGNOSIS — Z20822 Contact with and (suspected) exposure to covid-19: Secondary | ICD-10-CM | POA: Insufficient documentation

## 2021-05-18 DIAGNOSIS — Z01812 Encounter for preprocedural laboratory examination: Secondary | ICD-10-CM | POA: Insufficient documentation

## 2021-05-18 LAB — SARS CORONAVIRUS 2 (TAT 6-24 HRS): SARS Coronavirus 2: NEGATIVE

## 2021-05-18 NOTE — Progress Notes (Signed)
Surgical Instructions    Your procedure is scheduled on Thursday June 23rd.  Report to [facility] Main Entrance "A" at 5:30 A.M., then check in with the Admitting office.  Call this number if you have problems the morning of surgery:  [phone]   If you have any questions prior to your surgery date call [phone]: Open Monday-Friday 8am-4pm    Remember:  Do not eat after midnight the night before your surgery  You may drink clear liquids until 4:30am the morning of your surgery.   Clear liquids allowed are: Water, Non-Citrus Juices (without pulp), Carbonated Beverages, Clear Tea, Black Coffee Only, and Gatorade   Enhanced Recovery after Surgery Enhanced Recovery after Surgery is a protocol used to improve the stress on your body and your recovery after surgery.  Patient Instructions  The day of surgery (if you do NOT have diabetes):  Drink ONE (1) Pre-Surgery Clear Ensure by __4:30___ am the morning of surgery   This drink was given to you during your hospital  pre-op appointment visit. Nothing else to drink after completing the  Pre-Surgery Clear Ensure.          If you have questions, please contact your surgeon's office.     Take these medicines the morning of surgery with A SIP OF WATER  amLODipine (NORVASC) 5 MG tablet ezetimibe (ZETIA) 10 MG tablet metoprolol succinate (TOPROL-XL) 50 MG 24 hr tablet potassium chloride SA (KLOR-CON) 20 MEQ tablet   IF NEEDED acetaminophen (TYLENOL) 325 MG tablet nitroGLYCERIN (NITROSTAT) 0.4 MG SL tablet   As of today, STOP taking any Aspirin (unless otherwise instructed by your surgeon) Aleve, Naproxen, Ibuprofen, Motrin, Advil, Goody's, BC's, all herbal medications, fish oil, and all vitamins.          Do not wear jewelry or makeup Do not wear lotions, powders, perfumes, or deodorant. Do not shave 48 hours prior to surgery.   Do not bring valuables to the hospital. DO Not wear nail polish, gel polish, artificial nails,  or any other type of covering on natural nails including finger and toenails. If patients have artificial nails, gel coating, etc. that need to be removed by a nail salon please have this removed prior to surgery or surgery may need to be canceled/delayed if the surgeon/ anesthesia feels like the patient is unable to be adequately monitored.             [facility] is not responsible for any belongings or valuables.  Do NOT Smoke (Tobacco/Vaping) or drink Alcohol 24 hours prior to your procedure If you use a CPAP at night, you may bring all equipment for your overnight stay.   Contacts, glasses, dentures or bridgework may not be worn into surgery, please bring cases for these belongings   For patients admitted to the hospital, discharge time will be determined by your treatment team.   Patients discharged the day of surgery will not be allowed to drive home, and someone needs to stay with them for 24 hours.  ONLY 1 SUPPORT PERSON MAY BE PRESENT WHILE YOU ARE IN SURGERY. IF YOU ARE TO BE ADMITTED ONCE YOU ARE IN YOUR ROOM YOU WILL BE ALLOWED TWO (2) VISITORS.  Minor children may have two parents present. Special consideration for safety and communication needs will be reviewed on a case by case basis.  Special instructions:    Oral Hygiene is also important to reduce your risk of infection.  Remember - BRUSH YOUR TEETH THE MORNING OF SURGERY WITH  [facility]- Preparing For Surgery  Before surgery, you can play an important role. Because skin is not sterile, your skin needs to be as free of germs as possible. You can reduce the number of germs on your skin by washing with CHG (chlorahexidine gluconate) Soap before surgery.  CHG is an antiseptic cleaner which kills germs and bonds with the skin to continue killing germs even after washing.     Please do not use if you have an allergy to CHG or antibacterial soaps. If your skin becomes reddened/irritated stop using the  CHG.  Do not shave (including legs and underarms) for at least 48 hours prior to first CHG shower. It is OK to shave your face.  Please follow these instructions carefully.     Shower the NIGHT BEFORE SURGERY and the MORNING OF SURGERY with CHG Soap.   If you chose to wash your hair, wash your hair first as usual with your normal shampoo. After you shampoo, rinse your hair and body thoroughly to remove the shampoo.  Then [employer] and genitals (private parts) with your normal soap and rinse thoroughly to remove soap.  After that Use CHG Soap as you would any other liquid soap. You can apply CHG directly to the skin and wash gently with a scrungie or a clean washcloth.   Apply the CHG Soap to your body ONLY FROM THE NECK DOWN.  Do not use on open wounds or open sores. Avoid contact with your eyes, ears, mouth and genitals (private parts). Wash Face and genitals (private parts)  with your normal soap.   Wash thoroughly, paying special attention to the area where your surgery will be performed.  Thoroughly rinse your body with warm water from the neck down.  DO NOT shower/wash with your normal soap after using and rinsing off the CHG Soap.  Pat yourself dry with a CLEAN TOWEL.  Wear CLEAN PAJAMAS to bed the night before surgery  Place CLEAN SHEETS on your bed the night before your surgery  DO NOT SLEEP WITH PETS.   Day of Surgery:  Take a shower with CHG soap. Wear Clean/Comfortable clothing the morning of surgery Do not apply any deodorants/lotions.   Remember to brush your teeth WITH YOUR REGULAR TOOTHPASTE.   Please read over the following fact sheets that you were given.

## 2021-05-19 ENCOUNTER — Other Ambulatory Visit (HOSPITAL_COMMUNITY): Payer: Medicare HMO

## 2021-05-19 ENCOUNTER — Encounter (HOSPITAL_COMMUNITY)
Admission: RE | Admit: 2021-05-19 | Discharge: 2021-05-19 | Disposition: A | Discharge status: Home / Self Care | Payer: Medicare HMO | Source: Ambulatory Visit | Attending: General Surgery | Admitting: General Surgery

## 2021-05-19 ENCOUNTER — Other Ambulatory Visit: Payer: Self-pay

## 2021-05-19 ENCOUNTER — Encounter (HOSPITAL_COMMUNITY): Payer: Self-pay

## 2021-05-19 DIAGNOSIS — E785 Hyperlipidemia, unspecified: Secondary | ICD-10-CM | POA: Diagnosis not present

## 2021-05-19 DIAGNOSIS — Z7982 Long term (current) use of aspirin: Secondary | ICD-10-CM | POA: Diagnosis not present

## 2021-05-19 DIAGNOSIS — I2510 Atherosclerotic heart disease of native coronary artery without angina pectoris: Secondary | ICD-10-CM | POA: Insufficient documentation

## 2021-05-19 DIAGNOSIS — I252 Old myocardial infarction: Secondary | ICD-10-CM | POA: Insufficient documentation

## 2021-05-19 DIAGNOSIS — C50912 Malignant neoplasm of unspecified site of left female breast: Secondary | ICD-10-CM | POA: Insufficient documentation

## 2021-05-19 DIAGNOSIS — Z01812 Encounter for preprocedural laboratory examination: Secondary | ICD-10-CM | POA: Diagnosis not present

## 2021-05-19 DIAGNOSIS — Z955 Presence of coronary angioplasty implant and graft: Secondary | ICD-10-CM | POA: Insufficient documentation

## 2021-05-19 DIAGNOSIS — Z8616 Personal history of COVID-19: Secondary | ICD-10-CM | POA: Diagnosis not present

## 2021-05-19 DIAGNOSIS — I10 Essential (primary) hypertension: Secondary | ICD-10-CM | POA: Diagnosis not present

## 2021-05-19 DIAGNOSIS — Z79899 Other long term (current) drug therapy: Secondary | ICD-10-CM | POA: Diagnosis not present

## 2021-05-19 HISTORY — DX: Angina pectoris, unspecified: I20.9

## 2021-05-19 HISTORY — DX: Pneumonia due to coronavirus disease 2019: J12.82

## 2021-05-19 HISTORY — DX: COVID-19: U07.1

## 2021-05-19 LAB — CBC
HCT: 40 % (ref 36.0–46.0)
Hemoglobin: 12.7 g/dL (ref 12.0–15.0)
MCH: 27.7 pg (ref 26.0–34.0)
MCHC: 31.8 g/dL (ref 30.0–36.0)
MCV: 87.1 fL (ref 80.0–100.0)
Platelets: 213 10*3/uL (ref 150–400)
RBC: 4.59 MIL/uL (ref 3.87–5.11)
RDW: 13.8 % (ref 11.5–15.5)
WBC: 6.5 10*3/uL (ref 4.0–10.5)
nRBC: 0 % (ref 0.0–0.2)

## 2021-05-19 LAB — BASIC METABOLIC PANEL
Anion gap: 8 (ref 5–15)
BUN: 22 mg/dL (ref 8–23)
CO2: 28 mmol/L (ref 22–32)
Calcium: 9.9 mg/dL (ref 8.9–10.3)
Chloride: 101 mmol/L (ref 98–111)
Creatinine, Ser: 0.82 mg/dL (ref 0.44–1.00)
GFR, Estimated: >60 mL/min (ref >60)
Glucose, Bld: 102 mg/dL — ABNORMAL HIGH (ref 70–99)
Potassium: 3.7 mmol/L (ref 3.5–5.1)
Sodium: 137 mmol/L (ref 135–145)

## 2021-05-19 NOTE — Progress Notes (Signed)
Anesthesia Chart Review:  Case: [id number] Date/Time: 05/21/21 0715   Procedure: LEFT BREAST LUMPECTOMY WITH RADIOACTIVE SEED LOCALIZATION (Left: Breast)   Anesthesia type: General   Pre-op diagnosis: LEFT BREAST CANCER   Location: [facility] OR ROOM 07 / [place] [facility] OR   Surgeons: Rolm Bookbinder, MD       DISCUSSION: Patient is an 80 year old female scheduled for the above procedure.   History includes former smoker, CAD (NSTEMI, s/p DES prox RCA & mid LAD 03/08/05; s/p DES prox-mid LAD, DES mid CX, DES prox-mid RCA 02/28/18), tachycardia, HTN (possible white-coat component), HLD, impaired glucose tolerance, psoriasis, skin cancer, breast cancer (03/25/21 left breast biopsy: invasive ductal carcinoma grade 1, DCIS), COVID-19 (+12/16/19, s/p bamlanivimab infusion then admission 12/20/19-11/22/20 for acute hypoxic respiratory failure from COVID PNA 12/20/19, s/p O2/[place], steroids, remdesivir, Rocephin/Zithromax, discharged on Augmentin & O2 2-4L, now off home O2).  By notes, her husband died 5/40/08 from [place] complications.) Reported being difficult to wake up after anesthesia.    Patient was evaluated by cardiologist Dr. Irish Lack on 05/01/21. Preoperative evaluation A/P included: "CAD: Continue aggressive secondary prevention.  Holding clopidogrel 5 days prior to surgery on 6/23. She will continue aspirin.  No cardiac testing needed prior to surgery.  She walks up and down stairs without any cardiac symptoms. Preoperative cardiovascular exam: She needs surgery with Dr. Donne Hazel for breast cancer..." He wants her to resume Plavix the day after surgery. Six month follow-up planned. Last Plavix 05/15/21. Continue ASA 81 mg perioperatively.    05/18/21 presurgical COVID-19 testing negative. Anesthesia team to evaluate on the day of surgery.   VS: BP 130/68   Pulse 75   Temp 37 C (Oral)   Resp 17   Ht 5\' 4"  (1.626 m)   Wt 66.7 kg   LMP 11/29/1974   SpO2 98%   BMI 25.23 kg/m    PROVIDERS: [place], Betty G, MD is  PCP  Larae Grooms, MD is cardiologist Nicholas Lose, MD is Baker Pierini, MD is RAD-[facility]. He will see her after surgery to determine if she would benefit from radiotherapy.    LABS: Labs reviewed: Acceptable for surgery. A1c 5.8% on 09/09/20.  (all labs ordered are listed, but only abnormal results are displayed)  Labs Reviewed  BASIC METABOLIC PANEL - Abnormal; Notable for the following components:      Result Value   Glucose, Bld 102 (*)    All other components within normal limits  CBC    EKG: 05/01/21 ([facility]): NSR. Non-specific ST changes.   CV: Echo 11/09/19: IMPRESSIONS   1. Left ventricular ejection fraction, by visual estimation, is 60 to  65%. The left ventricle has normal function. Left ventricular septal wall  thickness was mildly increased. There is no left ventricular hypertrophy.   2. The left ventricle has no regional wall motion abnormalities.   3. Global right ventricle has normal systolic function.The right  ventricular size is normal. No increase in right ventricular wall  thickness.   4. Left atrial size was normal.   5. Right atrial size was normal.   6. Mild mitral annular calcification.   7. The mitral valve is normal in structure. Trivial mitral valve  regurgitation. No evidence of mitral stenosis.   8. The tricuspid valve is normal in structure. Tricuspid valve  regurgitation is not demonstrated.   9. The aortic valve is tricuspid. Aortic valve regurgitation is mild.  Mild aortic valve sclerosis without stenosis.  10. The pulmonic valve was normal in structure. Pulmonic  valve  regurgitation is not visualized.  11. The inferior vena cava is normal in size with greater than 50%  respiratory variability, suggesting right atrial pressure of 3 mmHg.  - In comparison to the previous echocardiogram(s): 10/26/16 EF 65-70%.   Long-term cardiac monitor 10/11/19-10/14/19: Study Highlights Normal sinus rhythm with occasional premature  beats. Short runs of PACs and PVCs lasting just seconds but correlating to her symptoms. No sustained arrhtyhmias. No atrial fibrillation noted.  - No pathologic arrhythmias.  Last echo was 2017.  Would repeat echo to check heart structure again and check for change in mitral valve.   Cardiac cath 02/28/18 (done following ischemic stress test): Conclusion Previously placed Ost RCA to Prox RCA stent (unknown type) is widely patent. Prox RCA lesion is 40% stenosed. Mid RCA lesion is 99% stenosed. A drug-eluting stent was successfully placed using a STENT SYNERGY DES 3.5X32, postdilated to 3.75 mm. Post intervention, there is a 0% residual stenosis. Prox LAD to Mid LAD lesion is 80% stenosed. Mid LAD lesion is 80% stenosed. Part of the lesion was instent restenosis and the other part de novo. A drug-eluting stent was successfully placed using a STENT SYNERGY DES 3X24, postdilated to 3.5 mm. Post intervention, there is a 0% residual stenosis. Mid Cx lesion is 90% stenosed. A drug-eluting stent was successfully placed using a STENT SYNERGY DES 2.75X28. Post intervention, there is a 0% residual stenosis. The left ventricular systolic function is normal. LV end diastolic pressure is normal. The left ventricular ejection fraction is 55-65% by visual estimate. There is no aortic valve stenosis. - Continue dual antiplatelet therapy for at least 1 year and likely beyond, along with aggressive secondary prevention.   Past Medical History:  Diagnosis Date   Anginal pain ([place])    Anxiety and depression    Breast cancer ([place])    Breast cancer ([place])    CAD (coronary artery disease)    S/P NSTEMI 4/06 EF 60%; Treated with Cypher DES to LAD and RCA with kissing balloon PCI diagonal; Treadmill myoview 10/09 for CP: Walked 7:06. mild ST-T-wave changes in recovery. EF 71%. Very mild reversible defect in base of the inferior wall --> medical rx.   Cancer ([place])    HISTORY OF SKIN CANCER   Cataract     Complication of anesthesia    " DIFFICULT TO WAKE UP "   Depression    Family history of breast cancer 04/01/2021   Family history of colon cancer 04/01/2021   Family history of malignant melanoma 04/01/2021   Glucose intolerance (impaired glucose tolerance)    History of echocardiogram    Echo 11/17: Vigorous LVF, EF 65-70, normal wall motion, borderline diastolic function, systolic bowing of mitral valve without prolapse, mild MR, trivial TR, PASP 20   History of medication noncompliance    Hyperlipidemia    Intolerate to multiple statins due to severe myalgias   Hypertension    Possible white-coat component   MI (myocardial infarction) ([place])    acute-s/p stent   Microhematuria    Pneumonia due to COVID-19 virus    Psoriasis    Rapid heart beat    Vitamin D deficiency     Past Surgical History:  Procedure Laterality Date   ABDOMINAL HYSTERECTOMY     and anterior repair   APPENDECTOMY     bladder tact     BREAST BIOPSY     CARDIAC CATHETERIZATION  02/28/2018   CATARACT EXTRACTION Bilateral 2017   Dr. Katy Fitch   CORONARY  STENT INTERVENTION N/A 02/28/2018   Procedure: CORONARY STENT INTERVENTION;  Surgeon: Jettie Booze, MD;  Location: [facility];  Service: Cardiovascular;  Laterality: N/A;   CORONARY STENT PLACEMENT     CORONARY STENT PLACEMENT  04/02/019   EYE SURGERY Bilateral    cataract   HEMORRHOID SURGERY     LEFT HEART CATH AND CORONARY ANGIOGRAPHY N/A 02/28/2018   Procedure: LEFT HEART CATH AND CORONARY ANGIOGRAPHY;  Surgeon: Jettie Booze, MD;  Location: [facility];  Service: Cardiovascular;  Laterality: N/A;   MOHS SURGERY  07/2013   BCC.  [facility]   RECTAL PROLAPSE REPAIR  1976   TONSILLECTOMY AND ADENOIDECTOMY     ULTRASOUND GUIDANCE FOR VASCULAR ACCESS  02/28/2018   Procedure: Ultrasound Guidance For Vascular Access;  Surgeon: Jettie Booze, MD;  Location: [facility];  Service: Cardiovascular;;    MEDICATIONS:   acetaminophen (TYLENOL) 325 MG tablet   amLODipine (NORVASC) 5 MG tablet   Ascorbic Acid (VITAMIN C) 1000 MG tablet   aspirin 81 MG EC tablet   Biotin 10000 MCG TABS   Cholecalciferol (VITAMIN D3) 125 MCG (5000 UT) CAPS   clopidogrel (PLAVIX) 75 MG tablet   ezetimibe (ZETIA) 10 MG tablet   irbesartan-hydrochlorothiazide (AVALIDE) 300-12.5 MG tablet   metoprolol succinate (TOPROL-XL) 50 MG 24 hr tablet   nitroGLYCERIN (NITROSTAT) 0.4 MG SL tablet   Polyethyl Glycol-Propyl Glycol (SYSTANE OP)   potassium chloride SA (KLOR-CON) 20 MEQ tablet   zinc gluconate 50 MG tablet   No current facility-administered medications for this encounter.    Myra Gianotti, PA-C Surgical Short Stay/Anesthesiology [facility] Phone [phone] [facility] Phone [phone] 05/19/2021 3:48 PM

## 2021-05-19 NOTE — Progress Notes (Signed)
PCP - Dr. Betty Martinique Cardiologist - Dr. Irish Lack  Chest x-ray - Not indicated EKG - 05/01/21 Stress Test - 02/21/18 ECHO - 11/09/19 Cardiac Cath - 02/28/18  Sleep Study - Denies  DM - Denies  Blood Thinner Instructions: Plavix stopped on Saturday Last dose Friday 05/15/21 Aspirin Instructions:Will continue through day of surgery   ERAS Protcol -Yes  PRE-SURGERY Ensure given  Anesthesia review: Yes Cardiac history  Patient denies shortness of breath, fever, cough and chest pain at PAT appointment   All instructions explained to the patient, with a verbal understanding of the material. Patient agrees to go over the instructions while at home for a better understanding. Patient also instructed to self quarantine after being tested for COVID-19. The opportunity to ask questions was provided.

## 2021-05-19 NOTE — Anesthesia Preprocedure Evaluation (Addendum)
Anesthesia Evaluation  Patient identified by MRN, date of birth, ID band Patient awake    Reviewed: Allergy & Precautions, NPO status , Patient's Chart, lab work & pertinent test results  Airway Mallampati: II  TM Distance: >3 FB Neck ROM: Full    Dental no notable dental hx.    Pulmonary    Pulmonary exam normal breath sounds clear to auscultation       Cardiovascular hypertension, + CAD, + Past MI and + Cardiac Stents  Normal cardiovascular exam Rhythm:Regular Rate:Normal     Neuro/Psych PSYCHIATRIC DISORDERS Anxiety Depression    GI/Hepatic negative GI ROS, Neg liver ROS,   Endo/Other  negative endocrine ROS  Renal/GU negative Renal ROS     Musculoskeletal negative musculoskeletal ROS (+)   Abdominal   Peds  Hematology negative hematology ROS (+)   Anesthesia Other Findings Breast ca  Reproductive/Obstetrics negative OB ROS                           Anesthesia Physical Anesthesia Plan  ASA: 3  Anesthesia Plan: General   Post-op Pain Management:    Induction: Intravenous  PONV Risk Score and Plan: 3 and Treatment may vary due to age or medical condition and Ondansetron  Airway Management Planned: LMA  Additional Equipment:   Intra-op Plan:   Post-operative Plan: Extubation in OR  Informed Consent: I have reviewed the patients History and Physical, chart, labs and discussed the procedure including the risks, benefits and alternatives for the proposed anesthesia with the patient or authorized representative who has indicated his/her understanding and acceptance.     Dental advisory given  Plan Discussed with: Anesthesiologist and CRNA  Anesthesia Plan Comments: (Plan is to stop Plavix x 5 days, continue ASA. Last dose of plavix 6/17. Last dose of ASA 6/23)     Anesthesia Quick Evaluation

## 2021-05-20 ENCOUNTER — Ambulatory Visit
Admission: RE | Admit: 2021-05-20 | Discharge: 2021-05-20 | Disposition: A | Discharge status: Home / Self Care | Payer: Medicare HMO | Source: Ambulatory Visit | Attending: General Surgery | Admitting: General Surgery

## 2021-05-20 DIAGNOSIS — Z170 Estrogen receptor positive status [ER+]: Secondary | ICD-10-CM

## 2021-05-20 DIAGNOSIS — C50812 Malignant neoplasm of overlapping sites of left female breast: Secondary | ICD-10-CM | POA: Diagnosis not present

## 2021-05-20 DIAGNOSIS — C50412 Malignant neoplasm of upper-outer quadrant of left female breast: Secondary | ICD-10-CM

## 2021-05-21 ENCOUNTER — Encounter (HOSPITAL_COMMUNITY)
Admission: RE | Disposition: A | Discharge status: Home / Self Care | Payer: Self-pay | Source: Home / Self Care | Attending: General Surgery

## 2021-05-21 ENCOUNTER — Ambulatory Visit (HOSPITAL_COMMUNITY): Payer: Medicare HMO | Admitting: Vascular Surgery

## 2021-05-21 ENCOUNTER — Ambulatory Visit (HOSPITAL_COMMUNITY): Payer: Medicare HMO | Admitting: Certified Registered"

## 2021-05-21 ENCOUNTER — Encounter (HOSPITAL_COMMUNITY): Payer: Self-pay | Admitting: General Surgery

## 2021-05-21 ENCOUNTER — Ambulatory Visit
Admission: RE | Admit: 2021-05-21 | Discharge: 2021-05-21 | Disposition: A | Discharge status: Home / Self Care | Payer: Medicare HMO | Source: Ambulatory Visit | Attending: General Surgery | Admitting: General Surgery

## 2021-05-21 ENCOUNTER — Ambulatory Visit (HOSPITAL_COMMUNITY)
Admission: RE | Admit: 2021-05-21 | Discharge: 2021-05-21 | Disposition: A | Discharge status: Home / Self Care | Payer: Medicare HMO | Attending: General Surgery | Admitting: General Surgery

## 2021-05-21 DIAGNOSIS — C50412 Malignant neoplasm of upper-outer quadrant of left female breast: Secondary | ICD-10-CM

## 2021-05-21 DIAGNOSIS — I2510 Atherosclerotic heart disease of native coronary artery without angina pectoris: Secondary | ICD-10-CM | POA: Diagnosis not present

## 2021-05-21 DIAGNOSIS — I119 Hypertensive heart disease without heart failure: Secondary | ICD-10-CM | POA: Diagnosis not present

## 2021-05-21 DIAGNOSIS — Z170 Estrogen receptor positive status [ER+]: Secondary | ICD-10-CM | POA: Insufficient documentation

## 2021-05-21 DIAGNOSIS — N6489 Other specified disorders of breast: Secondary | ICD-10-CM | POA: Diagnosis not present

## 2021-05-21 DIAGNOSIS — R928 Other abnormal and inconclusive findings on diagnostic imaging of breast: Secondary | ICD-10-CM | POA: Diagnosis not present

## 2021-05-21 DIAGNOSIS — C50912 Malignant neoplasm of unspecified site of left female breast: Secondary | ICD-10-CM | POA: Diagnosis not present

## 2021-05-21 HISTORY — PX: BREAST LUMPECTOMY: SHX2

## 2021-05-21 HISTORY — PX: BREAST LUMPECTOMY WITH RADIOACTIVE SEED LOCALIZATION: SHX6424

## 2021-05-21 SURGERY — BREAST LUMPECTOMY WITH RADIOACTIVE SEED LOCALIZATION
Anesthesia: General | Site: Breast | Laterality: Left

## 2021-05-21 MED ORDER — SODIUM CHLORIDE 0.9% FLUSH: 3.0000 mL | Freq: Two times a day (BID) | INTRAVENOUS | Status: DC

## 2021-05-21 MED ORDER — ACETAMINOPHEN 500 MG PO TABS
1000.0000 mg | ORAL_TABLET | ORAL | Status: AC
  Administered 2021-05-21: 1000 mg via ORAL
  Filled 2021-05-21: qty 2

## 2021-05-21 MED ORDER — OXYCODONE HCL 5 MG PO TABS
5.0000 mg | ORAL_TABLET | Freq: Once | ORAL | Status: DC | PRN
Start: 2021-05-21 — End: 2021-05-21

## 2021-05-21 MED ORDER — BUPIVACAINE-EPINEPHRINE 0.25% -1:200000 IJ SOLN
INTRAMUSCULAR | Status: DC | PRN
  Administered 2021-05-21: 10 mL

## 2021-05-21 MED ORDER — LIDOCAINE 2% (20 MG/ML) 5 ML SYRINGE
INTRAMUSCULAR | Status: AC
  Filled 2021-05-21: qty 5

## 2021-05-21 MED ORDER — DEXAMETHASONE SODIUM PHOSPHATE 10 MG/ML IJ SOLN
INTRAMUSCULAR | Status: DC | PRN
  Administered 2021-05-21: 6 mg via INTRAVENOUS

## 2021-05-21 MED ORDER — CHLORHEXIDINE GLUCONATE 0.12 % MT SOLN
15.0000 mL | Freq: Once | OROMUCOSAL | Status: AC
  Administered 2021-05-21: 15 mL via OROMUCOSAL
  Filled 2021-05-21: qty 15

## 2021-05-21 MED ORDER — ACETAMINOPHEN 650 MG RE SUPP: 650.0000 mg | RECTAL | Status: DC | PRN

## 2021-05-21 MED ORDER — FENTANYL CITRATE (PF) 100 MCG/2ML IJ SOLN: 25.0000 ug | INTRAMUSCULAR | Status: DC | PRN

## 2021-05-21 MED ORDER — FENTANYL CITRATE (PF) 250 MCG/5ML IJ SOLN
INTRAMUSCULAR | Status: AC
  Filled 2021-05-21: qty 5

## 2021-05-21 MED ORDER — OXYCODONE HCL 5 MG/5ML PO SOLN: 5.0000 mg | Freq: Once | ORAL | Status: DC | PRN

## 2021-05-21 MED ORDER — PROPOFOL 10 MG/ML IV BOLUS
INTRAVENOUS | Status: DC | PRN
  Administered 2021-05-21: 140 mg via INTRAVENOUS

## 2021-05-21 MED ORDER — PHENYLEPHRINE 40 MCG/ML (10ML) SYRINGE FOR IV PUSH (FOR BLOOD PRESSURE SUPPORT)
PREFILLED_SYRINGE | INTRAVENOUS | Status: DC | PRN
Start: 2021-05-21 — End: 2021-05-21
  Administered 2021-05-21 (×2): 80 ug via INTRAVENOUS
  Administered 2021-05-21: 120 ug via INTRAVENOUS
  Administered 2021-05-21: 40 ug via INTRAVENOUS

## 2021-05-21 MED ORDER — SODIUM CHLORIDE 0.9 % IV SOLN: 250.0000 mL | INTRAVENOUS | Status: DC | PRN

## 2021-05-21 MED ORDER — ENSURE PRE-SURGERY PO LIQD: 296.0000 mL | Freq: Once | ORAL | Status: DC

## 2021-05-21 MED ORDER — SODIUM CHLORIDE 0.9% FLUSH: 3.0000 mL | INTRAVENOUS | Status: DC | PRN

## 2021-05-21 MED ORDER — BUPIVACAINE HCL (PF) 0.25 % IJ SOLN
INTRAMUSCULAR | Status: AC
  Filled 2021-05-21: qty 30

## 2021-05-21 MED ORDER — FENTANYL CITRATE (PF) 100 MCG/2ML IJ SOLN
INTRAMUSCULAR | Status: DC | PRN
  Administered 2021-05-21 (×2): 50 ug via INTRAVENOUS

## 2021-05-21 MED ORDER — ONDANSETRON HCL 4 MG/2ML IJ SOLN
INTRAMUSCULAR | Status: DC | PRN
  Administered 2021-05-21: 4 mg via INTRAVENOUS

## 2021-05-21 MED ORDER — EPHEDRINE SULFATE-NACL 50-0.9 MG/10ML-% IV SOSY
PREFILLED_SYRINGE | INTRAVENOUS | Status: DC | PRN
  Administered 2021-05-21 (×2): 10 mg via INTRAVENOUS

## 2021-05-21 MED ORDER — PROPOFOL 10 MG/ML IV BOLUS
INTRAVENOUS | Status: AC
  Filled 2021-05-21: qty 20

## 2021-05-21 MED ORDER — ORAL CARE MOUTH RINSE: 15.0000 mL | Freq: Once | OROMUCOSAL | Status: AC

## 2021-05-21 MED ORDER — LACTATED RINGERS IV SOLN: INTRAVENOUS | Status: DC

## 2021-05-21 MED ORDER — CEFAZOLIN SODIUM-DEXTROSE 2-4 GM/100ML-% IV SOLN
2.0000 g | INTRAVENOUS | Status: AC
  Administered 2021-05-21: 2 g via INTRAVENOUS
  Filled 2021-05-21: qty 100

## 2021-05-21 MED ORDER — ACETAMINOPHEN 325 MG PO TABS: 650.0000 mg | ORAL_TABLET | ORAL | Status: DC | PRN

## 2021-05-21 MED ORDER — LIDOCAINE 2% (20 MG/ML) 5 ML SYRINGE
INTRAMUSCULAR | Status: DC | PRN
  Administered 2021-05-21: 30 mg via INTRAVENOUS

## 2021-05-21 MED ORDER — ONDANSETRON HCL 4 MG/2ML IJ SOLN: 4.0000 mg | Freq: Once | INTRAMUSCULAR | Status: DC | PRN

## 2021-05-21 MED ORDER — OXYCODONE HCL 5 MG PO TABS: 5.0000 mg | ORAL_TABLET | ORAL | Status: DC | PRN

## 2021-05-21 SURGICAL SUPPLY — 39 items
ADH SKN CLS APL DERMABOND .7 (GAUZE/BANDAGES/DRESSINGS) ×1
APL PRP STRL LF DISP 70% ISPRP (MISCELLANEOUS) ×1
BINDER BREAST LRG (GAUZE/BANDAGES/DRESSINGS) ×1 IMPLANT
CANISTER SUCT 3000ML PPV (MISCELLANEOUS) ×2 IMPLANT
CHLORAPREP W/TINT 26 (MISCELLANEOUS) ×2 IMPLANT
CLIP VESOCCLUDE MED 6/CT (CLIP) ×2 IMPLANT
COVER PROBE W GEL 5X96 (DRAPES) ×2 IMPLANT
COVER SURGICAL LIGHT HANDLE (MISCELLANEOUS) ×2 IMPLANT
COVER WAND RF STERILE (DRAPES) ×2 IMPLANT
DERMABOND ADVANCED (GAUZE/BANDAGES/DRESSINGS) ×1
DERMABOND ADVANCED .7 DNX12 (GAUZE/BANDAGES/DRESSINGS) ×1 IMPLANT
DEVICE DUBIN SPECIMEN MAMMOGRA (MISCELLANEOUS) ×2 IMPLANT
DRAPE CHEST BREAST 15X10 FENES (DRAPES) ×2 IMPLANT
ELECT COATED BLADE 2.86 ST (ELECTRODE) ×2 IMPLANT
ELECT REM PT RETURN 9FT ADLT (ELECTROSURGICAL) ×2
ELECTRODE REM PT RTRN 9FT ADLT (ELECTROSURGICAL) ×1 IMPLANT
GLOVE SURG POLYISO LF SZ6.5 (GLOVE) ×2 IMPLANT
GLOVE SURG POLYISO LF SZ7 (GLOVE) ×1 IMPLANT
GLOVE SURG UNDER POLY LF SZ7 (GLOVE) ×2 IMPLANT
GLOVE SURG UNDER POLY LF SZ7.5 (GLOVE) ×2 IMPLANT
GOWN STRL REUS W/ TWL LRG LVL3 (GOWN DISPOSABLE) ×2 IMPLANT
GOWN STRL REUS W/TWL LRG LVL3 (GOWN DISPOSABLE) ×4
KIT BASIN OR (CUSTOM PROCEDURE TRAY) ×2 IMPLANT
KIT MARKER MARGIN INK (KITS) ×2 IMPLANT
NDL HYPO 25GX1X1/2 BEV (NEEDLE) ×1 IMPLANT
NEEDLE HYPO 25GX1X1/2 BEV (NEEDLE) ×2 IMPLANT
NS IRRIG 1000ML POUR BTL (IV SOLUTION) ×2 IMPLANT
PACK GENERAL/GYN (CUSTOM PROCEDURE TRAY) ×2 IMPLANT
STRIP CLOSURE SKIN 1/2X4 (GAUZE/BANDAGES/DRESSINGS) ×2 IMPLANT
SUT MNCRL AB 4-0 PS2 18 (SUTURE) ×2 IMPLANT
SUT MON AB 5-0 PS2 18 (SUTURE) IMPLANT
SUT SILK 2 0 SH (SUTURE) IMPLANT
SUT VIC AB 2-0 SH 27 (SUTURE) ×2
SUT VIC AB 2-0 SH 27XBRD (SUTURE) ×1 IMPLANT
SUT VIC AB 3-0 SH 27 (SUTURE) ×2
SUT VIC AB 3-0 SH 27X BRD (SUTURE) ×1 IMPLANT
SYR CONTROL 10ML LL (SYRINGE) ×2 IMPLANT
TOWEL GREEN STERILE (TOWEL DISPOSABLE) ×2 IMPLANT
TOWEL GREEN STERILE FF (TOWEL DISPOSABLE) ×2 IMPLANT

## 2021-05-21 NOTE — Interval H&P Note (Signed)
History and Physical Interval Note:  05/21/2021 7:27 AM  Erika Brown  has presented today for surgery, with the diagnosis of LEFT BREAST CANCER.  The various methods of treatment have been discussed with the patient and family. After consideration of risks, benefits and other options for treatment, the patient has consented to  Procedure(s): LEFT BREAST LUMPECTOMY WITH RADIOACTIVE SEED LOCALIZATION (Left) as a surgical intervention.  The patient's history has been reviewed, patient examined, no change in status, stable for surgery.  I have reviewed the patient's chart and labs.  Questions were answered to the patient's satisfaction.     Rolm Bookbinder

## 2021-05-21 NOTE — Discharge Instructions (Signed)
[facility] Office Phone Number [phone]  BREAST BIOPSY/ PARTIAL MASTECTOMY: POST OP INSTRUCTIONS Take 400 mg of ibuprofen every 8 hours or 650 mg tylenol every 6 hours for next 72 hours then as needed. Use ice several times daily also. Always review your discharge instruction sheet given to you by the facility where your surgery was performed.  IF YOU HAVE DISABILITY OR FAMILY LEAVE FORMS, YOU MUST BRING THEM TO THE OFFICE FOR PROCESSING.  DO NOT GIVE THEM TO YOUR DOCTOR.  A prescription for pain medication may be given to you upon discharge.  Take your pain medication as prescribed, if needed.  If narcotic pain medicine is not needed, then you may take acetaminophen (Tylenol), naprosyn (Alleve) or ibuprofen (Advil) as needed. Take your usually prescribed medications unless otherwise directed If you need a refill on your pain medication, please contact your pharmacy.  They will contact our office to request authorization.  Prescriptions will not be filled after 5pm or on week-ends. You should eat very light the first 24 hours after surgery, such as soup, crackers, pudding, etc.  Resume your normal diet the day after surgery. Most patients will experience some swelling and bruising in the breast.  Ice packs and a good support bra will help.  Wear the breast binder provided or a sports bra for 72 hours day and night.  After that wear a sports bra during the day until you return to the office. Swelling and bruising can take several days to resolve.  It is common to experience some constipation if taking pain medication after surgery.  Increasing fluid intake and taking a stool softener will usually help or prevent this problem from occurring.  A mild laxative (Milk of Magnesia or Miralax) should be taken according to package directions if there are no bowel movements after 48 hours. Unless discharge instructions indicate otherwise, you may remove your bandages 48 hours after surgery  and you may shower at that time.  You may have steri-strips (small skin tapes) in place directly over the incision.  These strips should be left on the skin for 7-10 days and will come off on their own.  If your surgeon used skin glue on the incision, you may shower in 24 hours.  The glue will flake off over the next 2-3 weeks.  Any sutures or staples will be removed at the office during your follow-up visit. ACTIVITIES:  You may resume regular daily activities (gradually increasing) beginning the next day.  Wearing a good support bra or sports bra minimizes pain and swelling.  You may have sexual intercourse when it is comfortable. You may drive when you no longer are taking prescription pain medication, you can comfortably wear a seatbelt, and you can safely maneuver your car and apply brakes. RETURN TO WORK:  ______________________________________________________________________________________ You should see your doctor in the office for a follow-up appointment approximately two weeks after your surgery.  Your doctor's nurse will typically make your follow-up appointment when she calls you with your pathology report.  Expect your pathology report 3-4 business days after your surgery.  You may call to check if you do not hear from us after three days. OTHER INSTRUCTIONS: _______________________________________________________________________________________________ _____________________________________________________________________________________________________________________________________ _____________________________________________________________________________________________________________________________________ _____________________________________________________________________________________________________________________________________  WHEN TO CALL DR Apryll Hinkle: Fever over 101.0 Nausea and/or vomiting. Extreme swelling or bruising. Continued bleeding from incision. Increased  pain, redness, or drainage from the incision.  The clinic staff is available to answer your questions during regular business hours.  Please don't hesitate to call   and ask to speak to one of the nurses for clinical concerns.  If you have a medical emergency, go to the nearest emergency room or call 911.  A surgeon from [facility] is always on call at the hospital.  For further questions, please visit [email or web address]  

## 2021-05-21 NOTE — Anesthesia Procedure Notes (Addendum)
Procedure Name: LMA Insertion Date/Time: 05/21/2021 7:38 AM Performed by: Ezequiel Kayser, CRNA Pre-anesthesia Checklist: Patient identified, Emergency Drugs available, Suction available and Patient being monitored Patient Re-evaluated:Patient Re-evaluated prior to induction Oxygen Delivery Method: Circle System Utilized Preoxygenation: Pre-oxygenation with 100% oxygen Induction Type: IV induction Ventilation: Mask ventilation without difficulty LMA: LMA inserted LMA Size: 3.0 Number of attempts: 1 Airway Equipment and Method: Bite block Placement Confirmation: positive ETCO2 Tube secured with: Tape Dental Injury: Teeth and Oropharynx as per pre-operative assessment

## 2021-05-21 NOTE — Op Note (Signed)
Preoperative diagnosis: Left breast stage I breast cancer Postoperative diagnosis: Same as above Procedure: Left breast radioactive seed guided lumpectomy Surgeon: Dr. Serita Grammes Anesthesia: General EBL minimal Complications none Drains none Specimens: Left breast tissue containing seed and clip marked with paint Additional medial, superior and lateral margins marked short superior, long lateral, double deep Sponge and count was correct completion Decision to recovery stable condition  Indications: 73 yof who palpated a left breast mass.  no discharge. she has fh of dcis in a daughter at age 18 and a sister.  on [place] this is a 1.6x1.2x1.2 cm mass and axillary [place] is negative.  biopsy is a grade I IDC with DCIS that is >95 er/pr pos, her 2 negative and Ki is 1%.  Procedure: After informed consent was obtained the patient was taken to the operating room.  She was given antibiotics.  SCDs were in place.  She was placed under general anesthesia without complication.  She was prepped and draped in the standard sterile surgical fashion.  A surgical timeout was then performed.  The seed was in the lateral breast.  It felt like it was quite close to the skin.  I elected infiltrated Marcaine around this area and make a curvilinear incision overlying the seed in the lateral breast.  I dissected down the seed.  I removed the seed and the surrounding tissue with attempt to get a clear margin.  The posterior margin is the muscle now.  Mammogram confirmed removal of the seed and the clip.  I thought I was close to several margins which I removed.  I then obtained hemostasis.  I closed the cavity with 2-0 Vicryl.  The skin was closed with 3-0 Vicryl and 4-0 Monocryl.  Glue and Steri-Strips were applied.  She tolerated this well and was transferred to recovery.

## 2021-05-21 NOTE — Transfer of Care (Signed)
Immediate Anesthesia Transfer of Care Note  Patient: Erika Brown  Procedure(s) Performed: LEFT BREAST LUMPECTOMY WITH RADIOACTIVE SEED LOCALIZATION (Left: Breast)  Patient Location: PACU  Anesthesia Type:General  Level of Consciousness: drowsy  Airway & Oxygen Therapy: Patient Spontanous Breathing and Patient connected to face mask oxygen  Post-op Assessment: Report given to RN and Post -op Vital signs reviewed and stable  Post vital signs: Reviewed and stable  Last Vitals:  Vitals Value Taken Time  BP 145/67 05/21/21 0827  Temp    Pulse 70 05/21/21 0829  Resp 21 05/21/21 0829  SpO2 100 % 05/21/21 0829  Vitals shown include unvalidated device data.  Last Pain:  Vitals:   05/21/21 0646  TempSrc:   PainSc: 0-No pain         Complications: No notable events documented.

## 2021-05-21 NOTE — Anesthesia Postprocedure Evaluation (Signed)
Anesthesia Post Note  Patient: Erika Brown  Procedure(s) Performed: LEFT BREAST LUMPECTOMY WITH RADIOACTIVE SEED LOCALIZATION (Left: Breast)     Patient location during evaluation: PACU Anesthesia Type: General Level of consciousness: awake and alert Pain management: pain level controlled Vital Signs Assessment: post-procedure vital signs reviewed and stable Respiratory status: spontaneous breathing, nonlabored ventilation and respiratory function stable Cardiovascular status: blood pressure returned to baseline and stable Postop Assessment: no apparent nausea or vomiting Anesthetic complications: no   No notable events documented.  Last Vitals:  Vitals:   05/21/21 0857 05/21/21 0912  BP: 130/64 134/64  Pulse: 60 (!) 50  Resp: 17 12  Temp: (!) 36.3 C (!) 36.3 C  SpO2: 100% 97%    Last Pain:  Vitals:   05/21/21 0912  TempSrc:   PainSc: 0-No pain                 Merlinda Frederick

## 2021-05-21 NOTE — H&P (Signed)
80 yof who palpated a left breast mass.  no discharge. she has fh of dcis in a daughter at age 81 and a sister.  on [place] this is a 1.6x1.2x1.2 cm mass and axillary [place] is negative.  biopsy is a grade I IDC with DCIS that is >95 er/pr pos, her 2 negative and Ki is 1%.  she lives in [place].     Past Surgical History  Appendectomy   Breast Biopsy   Left. Cataract Surgery   Bilateral. Foot Surgery   Bilateral. Hysterectomy (not due to cancer) - Partial   Oral Surgery   Resection of Small Bowel   Tonsillectomy    Diagnostic Studies History  Colonoscopy   >10 years ago Mammogram   within last year Pap Smear   1-5 years ago  Medication History  Medications Reconciled   Social History Alcohol use   Moderate alcohol use. Caffeine use   Coffee, Tea. No drug use   Tobacco use   Never smoker.  Family History Arthritis   Mother. Cervical Cancer   Daughter, Sister. Colon Cancer   Son. Colon Polyps   Daughter, Father, Son. Depression   Daughter, Son. Diabetes Mellitus   Son. Heart Disease   Family Members In General, Mother. Hypertension   Brother, Mother, Son. Melanoma   Daughter, Mother. Rectal Cancer   Son. Seizure disorder   Daughter, Son. Thyroid problems   Father.  Pregnancy / Birth History  Age at menarche   64 years. Age of menopause   87-50 Gravida   80 Maternal age   91-20 Para   39  Other Problems  Depression   Heart murmur   High blood pressure   Home Oxygen Use   Hypercholesterolemia   Lump In Breast   Melanoma   Myocardial infarction      Review of Systems  General Present- Night Sweats. Not Present- Appetite Loss, Chills, Fatigue, Fever, Weight Gain and Weight Loss. Skin Present- Rash. Not Present- Change in Wart/Mole, Dryness, Hives, Jaundice, New Lesions, Non-Healing Wounds and Ulcer. HEENT Present- Hearing Loss. Not Present- Earache, Hoarseness, Nose Bleed, Oral Ulcers, Ringing in the Ears, Seasonal Allergies, Sinus Pain, Sore Throat, Visual Disturbances,  Wears glasses/contact lenses and Yellow Eyes. Respiratory Not Present- Bloody sputum, Chronic Cough, Difficulty Breathing, Snoring and Wheezing. Breast Present- Breast Mass. Not Present- Breast Pain, Nipple Discharge and Skin Changes. Cardiovascular Present- Palpitations. Not Present- Chest Pain, Difficulty Breathing Lying Down, Leg Cramps, Rapid Heart Rate, Shortness of Breath and Swelling of Extremities. Gastrointestinal Not Present- Abdominal Pain, Bloating, Bloody Stool, Change in Bowel Habits, Chronic diarrhea, Constipation, Difficulty Swallowing, Excessive gas, Gets full quickly at meals, Hemorrhoids, Indigestion, Nausea, Rectal Pain and Vomiting. Female Genitourinary Not Present- Frequency, Nocturia, Painful Urination, Pelvic Pain and Urgency. Neurological Not Present- Decreased Memory, Fainting, Headaches, Numbness, Seizures, Tingling, Tremor, Trouble walking and Weakness. Psychiatric Not Present- Anxiety, Bipolar, Change in Sleep Pattern, Depression, Fearful and Frequent crying. Endocrine Not Present- Cold Intolerance, Excessive Hunger, Hair Changes, Heat Intolerance, Hot flashes and New Diabetes. Hematology Present- Blood Thinners and Easy Bruising. Not Present- Excessive bleeding, Gland problems, HIV and Persistent Infections.   Physical Exam  General Mental Status - Alert. Orientation - Oriented X3. Breast Nipples - No Discharge. Breast Lump - No Palpable Breast Mass. Lymphatic Head & Neck General Head & Neck Lymphatics: Bilateral - Description - Normal. Axillary General Axillary Region: Bilateral - Description - Normal. Note:  no [place] adenopathy   A/P BREAST CANCER OF UPPER-OUTER QUADRANT OF LEFT FEMALE  BREAST (C50.412) Story: Left breast seed guided lumpectomy cardiology clearance-plan to stop plavix five days prior to surgery, restart following day, can stay on 81 mg asa We discussed the staging and pathophysiology of breast cancer. We discussed all of the different  options for treatment for breast cancer including surgery, chemotherapy, radiation therapy, Herceptin, and antiestrogen therapy. I dont think at 80 with other medical issues and a grade 1 1.6 cm >95% er pos tumor she needs sentinel node biopsy per NCCN and Choosing Wisely guidelines We discussed the options for treatment of the breast cancer which included lumpectomy versus a mastectomy. We discussed the performance of the lumpectomy with radioactive seed placement. We discussed a 5-10% chance of a positive margin requiring reexcision in the operating room. We also discussed that she might need radiation therapy if she undergoes lumpectomy. We discussed mastectomy and the postoperative care for that as well. Mastectomy can be followed by reconstruction. The decision for lumpectomy vs mastectomy has no impact on decision for chemotherapy. Most mastectomy patients will not need radiation therapy. We discussed that there is no difference in her survival whether she undergoes lumpectomy with radiation therapy or antiestrogen therapy versus a mastectomy. There is also no real difference between her recurrence in the breast. We discussed the risks of operation including bleeding, infection, possible reoperation. She understands her further therapy will be based on what her stages at the time of her operation.

## 2021-05-22 ENCOUNTER — Encounter (HOSPITAL_COMMUNITY): Payer: Self-pay | Admitting: General Surgery

## 2021-05-22 NOTE — Addendum Note (Signed)
Addendum  created 05/22/21 1222 by Merlinda Frederick, MD   Intraprocedure Staff edited

## 2021-05-29 ENCOUNTER — Encounter: Payer: Self-pay | Admitting: *Deleted

## 2021-06-02 ENCOUNTER — Encounter: Payer: Self-pay | Admitting: *Deleted

## 2021-06-02 ENCOUNTER — Telehealth: Payer: Self-pay | Admitting: Hematology and Oncology

## 2021-06-02 ENCOUNTER — Telehealth: Payer: Self-pay | Admitting: *Deleted

## 2021-06-02 LAB — SURGICAL PATHOLOGY

## 2021-06-02 NOTE — Telephone Encounter (Signed)
Called pt to sch appt per 7/5 sch msg. Pt declined to sch appt at this time, stating she does not want to proceed with tx. I sent a msg to [facility], letting her know of pt's decision.

## 2021-06-02 NOTE — Telephone Encounter (Signed)
Received msg from scheduling pt does not wish to schedule any f/u at this time. Physician team notified of pt decision. Pt scheduled to see Dr. Donne Hazel on 7/13 for f/u.

## 2021-06-12 ENCOUNTER — Telehealth: Payer: Self-pay | Admitting: Hematology and Oncology

## 2021-06-12 ENCOUNTER — Ambulatory Visit: Payer: PPO

## 2021-06-12 NOTE — Telephone Encounter (Signed)
Scheduled appointment per 07/15 sch msg. Left message.

## 2021-06-14 NOTE — Progress Notes (Signed)
Patient Care Team: Martinique, Betty G, MD as PCP - General (Family Medicine) Jettie Booze, MD as PCP - Cardiology (Cardiology) Rockwell Germany, RN as Oncology Nurse Navigator Mauro Kaufmann, RN as Oncology Nurse Navigator Rolm Bookbinder, MD as Consulting Physician (General Surgery) Nicholas Lose, MD as Consulting Physician (Hematology and Oncology) Kyung Rudd, MD as Consulting Physician (Radiation Oncology)  DIAGNOSIS:    ICD-10-CM   1. Post-menopausal  Z78.0 DG Bone Density    2. Malignant neoplasm of upper-outer quadrant of left breast in female, estrogen receptor positive ([place])  C50.412    Z17.0       SUMMARY OF ONCOLOGIC HISTORY: Oncology History  Malignant neoplasm of upper-outer quadrant of left breast in female, estrogen receptor positive ([place])  03/30/2021 Initial Diagnosis   Patient palpated a left breast abnormality for 7 weeks. Diagnostic mammogram and US showed a 1.6cm mass at the 3 o'clock position in the left breast and no left axillary adenopathy. Biopsy showed invasive and in situ ductal carcinoma, grade 1, HER-2 equivocal by IHC, negative by FISH (ratio 1.45). ER/PR+ >95%, Ki67 1%.   04/01/2021 Cancer Staging   Staging form: Breast, AJCC 8th Edition - Clinical stage from 04/01/2021: Stage IA (cT1c, cN0, cM0, G1, ER+, PR+, HER2-) - Signed by Nicholas Lose, MD on 04/01/2021  Stage prefix: Initial diagnosis  Histologic grading system: 3 grade system    04/13/2021 Genetic Testing   Negative hereditary cancer genetic testing: no pathogenic variants detected in [facility] CancerNext-Expanded +RNAinsight Panel.  Variant of uncertain significance detected in MSH6 at  p.T1100M (c.3299C>T).  The report date is Apr 13, 2021.    The CancerNext-Expanded gene panel offered by [facility] and includes sequencing, rearrangement, and RNA analysis for the following 77 genes: AIP, ALK, APC, ATM, AXIN2, BAP1, BARD1, BLM, BMPR1A, BRCA1, BRCA2, BRIP1, CDC73, CDH1, CDK4, CDKN1B,  CDKN2A, CHEK2, CTNNA1, DICER1, FANCC, FH, FLCN, GALNT12, KIF1B, LZTR1, MAX, MEN1, MET, MLH1, MSH2, MSH3, MSH6, MUTYH, NBN, NF1, NF2, NTHL1, PALB2, PHOX2B, PMS2, POT1, PRKAR1A, PTCH1, PTEN, RAD51C, RAD51D, RB1, RECQL, RET, SDHA, SDHAF2, SDHB, SDHC, SDHD, SMAD4, SMARCA4, SMARCB1, SMARCE1, STK11, SUFU, TMEM127, TP53, TSC1, TSC2, VHL and XRCC2 (sequencing and deletion/duplication); EGFR, EGLN1, HOXB13, KIT, MITF, PDGFRA, POLD1, and POLE (sequencing only); EPCAM and GREM1 (deletion/duplication only).    05/21/2021 Surgery   Left Lumpectomy: Grade 1 IDC 1.8 cm. LG DCIS, margins neg, HER-2 equivocal by IHC, negative by FISH (ratio 1.45). ER/PR+ >95%, Ki67 1%.     CHIEF COMPLIANT: Follow-up of left breast cancer  INTERVAL HISTORY: Erika Brown is a 80 y.o. with above-mentioned history of invasive ductal carcinoma of the left breast. She underwent a left lumpectomy on 05/21/21 with Dr. Rolm Bookbinder for which the pathology showed invasive ductal carcinoma, [place] grade 1 of 3, 1.8 cm, low grade DCIS, calcifications, and margins uninvolved by carcinoma (<0.1 cm; posterior margin). She reports to the clinic today for follow-up and to review the pathology report.  Her daughter was also on the phone to discuss the pros and cons of antiestrogen therapy.  ALLERGIES:  is allergic to iodine, latex, other, and statins.  MEDICATIONS:  Current Outpatient Medications  Medication Sig Dispense Refill   acetaminophen (TYLENOL) 325 MG tablet Take 650 mg by mouth every 6 (six) hours as needed for moderate pain.     amLODipine (NORVASC) 5 MG tablet TAKE 1 TABLET ONCE DAILY. (Patient taking differently: Take 5 mg by mouth daily.) 90 tablet 3   Ascorbic Acid (VITAMIN C) 1000 MG  tablet Take 1,000 mg by mouth daily.     aspirin 81 MG EC tablet Take 81 mg by mouth daily.     Biotin 10000 MCG TABS Take 10,000 mcg by mouth daily.     Cholecalciferol (VITAMIN D3) 125 MCG (5000 UT) CAPS Take 5,000 Units by mouth daily.      clopidogrel (PLAVIX) 75 MG tablet TAKE 1 TABLET ONCE DAILY. (Patient taking differently: Take 75 mg by mouth daily.) 90 tablet 3   ezetimibe (ZETIA) 10 MG tablet TAKE 1 TABLET ONCE DAILY. (Patient taking differently: Take 10 mg by mouth daily.) 90 tablet 1   irbesartan-hydrochlorothiazide (AVALIDE) 300-12.5 MG tablet Take 1 tablet by mouth daily. Please keep upcoming appt with Dr. Irish Lack in May 2022 before anymore refills. Thank you (Patient taking differently: Take 1 tablet by mouth daily.) 90 tablet 0   metoprolol succinate (TOPROL-XL) 50 MG 24 hr tablet TAKE 1 TABLET ONCE DAILY. (Patient taking differently: Take 50 mg by mouth daily.) 90 tablet 3   nitroGLYCERIN (NITROSTAT) 0.4 MG SL tablet Place 1 tablet (0.4 mg total) under the tongue every 5 (five) minutes as needed for chest pain. 25 tablet 1   Polyethyl Glycol-Propyl Glycol (SYSTANE OP) Place 1 drop into both eyes in the morning and at bedtime.     potassium chloride SA (KLOR-CON) 20 MEQ tablet TAKE 1 TABLET ONCE DAILY. (Patient taking differently: Take 20 mEq by mouth daily.) 90 tablet 3   zinc gluconate 50 MG tablet Take 50 mg by mouth daily.     No current facility-administered medications for this visit.    PHYSICAL EXAMINATION: ECOG PERFORMANCE STATUS: 1 - Symptomatic but completely ambulatory  Vitals:   06/15/21 1519  BP: 127/81  Pulse: 84  Resp: 18  Temp: (!) 97.4 F (36.3 C)  SpO2: 97%   Filed Weights   06/15/21 1519  Weight: 147 lb 11.2 oz (67 kg)      LABORATORY DATA:  I have reviewed the data as listed CMP Latest Ref Rng & Units 05/19/2021 04/01/2021 03/10/2021  Glucose 70 - 99 mg/dL 102(H) 99 90  BUN 8 - 23 mg/dL 22 20 17   Creatinine 0.44 - 1.00 mg/dL 0.82 0.81 0.72  Sodium 135 - 145 mmol/L 137 139 141  Potassium 3.5 - 5.1 mmol/L 3.7 4.1 3.7  Chloride 98 - 111 mmol/L 101 102 103  CO2 22 - 32 mmol/L 28 29 29   Calcium 8.9 - 10.3 mg/dL 9.9 9.7 10.0  Total Protein 6.5 - 8.1 g/dL - 6.7 6.5  Total Bilirubin  0.3 - 1.2 mg/dL - 0.6 0.6  Alkaline Phos 38 - 126 U/L - 80 85  AST 15 - 41 U/L - 19 16  ALT 0 - 44 U/L - 18 17    Lab Results  Component Value Date   WBC 6.5 05/19/2021   HGB 12.7 05/19/2021   HCT 40.0 05/19/2021   MCV 87.1 05/19/2021   PLT 213 05/19/2021   NEUTROABS 3.2 04/01/2021    ASSESSMENT & PLAN:  Malignant neoplasm of upper-outer quadrant of left breast in female, estrogen receptor positive ([place]) 04/17/2021:Patient palpated a left breast abnormality for 7 weeks. Diagnostic mammogram and US showed a 1.6cm mass at the 3 o'clock position in the left breast and no left axillary adenopathy. Biopsy showed invasive and in situ ductal carcinoma, grade 1, HER-2 equivocal by IHC, negative by FISH (ratio 1.45). ER/PR+ >95%, Ki67 1%.  05/21/21: Left Lumpectomy: Grade 1 IDC 1.8 cm. LG DCIS, margins neg, HER-2  equivocal by IHC, negative by FISH (ratio 1.45). ER/PR+ >95%, Ki67 1%.  Pathology counseling: I discussed the final pathology report of the patient provided  a copy of this report. I discussed the margins as well as lymph node surgeries. We also discussed the final staging along with previously performed ER/PR and HER-2/neu testing.  Recommendation: Adj Anti-estrogen therapy with letrozole 2.5 mg daily X 5 years Letrozole counseling: We discussed the risks and benefits of anti-estrogen therapy with aromatase inhibitors. These include but not limited to insomnia, hot flashes, mood changes, vaginal dryness, bone density loss, and weight gain. We strongly believe that the benefits far outweigh the risks.    After listening to the pros and cons of antiestrogen therapy, she expressed that she is very concerned about the loss of bone density and other cardiovascular risks as well.  She would prefer not to take the antiestrogens at this time.  If she changes her mind we can consider it later in the future.  RTC in 3 months for SCP visit       Orders Placed This Encounter  Procedures   DG  Bone Density    Standing Status:   Future    Standing Expiration Date:   06/15/2022    Order Specific Question:   Reason for Exam (SYMPTOM  OR DIAGNOSIS REQUIRED)    Answer:   post menopausal    Order Specific Question:   Preferred imaging location?    Answer:   [facility]    Order Specific Question:   Release to patient    Answer:   Immediate    The patient has a good understanding of the overall plan. she agrees with it. she will call with any problems that may develop before the next visit here.  Total time spent: 30 mins including face to face time and time spent for planning, charting and coordination of care  Rulon Eisenmenger, MD, MPH 06/15/2021  I, Thana Ates, am acting as scribe for Dr. Nicholas Lose.  I have reviewed the above documentation for accuracy and completeness, and I agree with the above.

## 2021-06-15 ENCOUNTER — Other Ambulatory Visit: Payer: Self-pay

## 2021-06-15 ENCOUNTER — Inpatient Hospital Stay: Payer: Medicare HMO | Attending: Hematology and Oncology | Admitting: Hematology and Oncology

## 2021-06-15 VITALS — BP 127/81 | HR 84 | Temp 97.4°F | Resp 18 | Ht 64.0 in | Wt 147.7 lb

## 2021-06-15 DIAGNOSIS — Z170 Estrogen receptor positive status [ER+]: Secondary | ICD-10-CM

## 2021-06-15 DIAGNOSIS — Z780 Asymptomatic menopausal state: Secondary | ICD-10-CM | POA: Diagnosis not present

## 2021-06-15 DIAGNOSIS — Z79899 Other long term (current) drug therapy: Secondary | ICD-10-CM | POA: Diagnosis not present

## 2021-06-15 DIAGNOSIS — Z7982 Long term (current) use of aspirin: Secondary | ICD-10-CM | POA: Diagnosis not present

## 2021-06-15 DIAGNOSIS — C50412 Malignant neoplasm of upper-outer quadrant of left female breast: Secondary | ICD-10-CM | POA: Diagnosis not present

## 2021-06-15 NOTE — Assessment & Plan Note (Signed)
04/17/2021:Patient palpated a left breast abnormality for 7 weeks. Diagnostic mammogram and US showed a 1.6cm mass at the 3 o'clock position in the left breast and no left axillary adenopathy. Biopsy showed invasive and in situ ductal carcinoma, grade 1, HER-2 equivocal by IHC, negative by FISH (ratio 1.45). ER/PR+ >95%, Ki67 1%.  05/21/21: Left Lumpectomy: Grade 1 IDC 1.8 cm. LG DCIS, margins neg, HER-2 equivocal by IHC, negative by FISH (ratio 1.45). ER/PR+ >95%, Ki67 1%.  Pathology counseling: I discussed the final pathology report of the patient provided  a copy of this report. I discussed the margins as well as lymph node surgeries. We also discussed the final staging along with previously performed ER/PR and HER-2/neu testing.  Plan: Adj Anti-estrogen therapy with letrozole 2.5 mg daily X 5 years Letrozole counseling: We discussed the risks and benefits of anti-estrogen therapy with aromatase inhibitors. These include but not limited to insomnia, hot flashes, mood changes, vaginal dryness, bone density loss, and weight gain. We strongly believe that the benefits far outweigh the risks. Patient understands these risks and consented to starting treatment. Planned treatment duration is 5 years.  RTC in 3 months for SCP visit

## 2021-06-16 ENCOUNTER — Telehealth: Payer: Self-pay | Admitting: Hematology and Oncology

## 2021-06-16 NOTE — Telephone Encounter (Signed)
Scheduled per 7/18 los. Called and spoke with pt confirmed added appt

## 2021-07-08 ENCOUNTER — Other Ambulatory Visit: Payer: Self-pay | Admitting: Adult Health

## 2021-07-08 DIAGNOSIS — C50412 Malignant neoplasm of upper-outer quadrant of left female breast: Secondary | ICD-10-CM

## 2021-07-08 NOTE — Progress Notes (Signed)
4

## 2021-07-11 ENCOUNTER — Other Ambulatory Visit: Payer: Self-pay | Admitting: Interventional Cardiology

## 2021-08-18 ENCOUNTER — Telehealth: Payer: Self-pay | Admitting: *Deleted

## 2021-08-21 ENCOUNTER — Encounter: Payer: Medicare HMO | Admitting: Adult Health

## 2021-08-26 ENCOUNTER — Other Ambulatory Visit: Payer: Self-pay | Admitting: Interventional Cardiology

## 2021-08-26 ENCOUNTER — Telehealth: Payer: Self-pay | Admitting: Adult Health

## 2021-08-26 NOTE — Telephone Encounter (Signed)
Scheduled per sch msg. Called and left msg  

## 2021-09-15 ENCOUNTER — Encounter: Payer: Self-pay | Admitting: Family Medicine

## 2021-09-15 ENCOUNTER — Ambulatory Visit (INDEPENDENT_AMBULATORY_CARE_PROVIDER_SITE_OTHER): Payer: Medicare HMO | Admitting: Family Medicine

## 2021-09-15 ENCOUNTER — Other Ambulatory Visit: Payer: Self-pay

## 2021-09-15 VITALS — BP 120/70 | HR 87 | Resp 16 | Ht 64.0 in | Wt 141.0 lb

## 2021-09-15 DIAGNOSIS — I119 Hypertensive heart disease without heart failure: Secondary | ICD-10-CM | POA: Diagnosis not present

## 2021-09-15 DIAGNOSIS — E785 Hyperlipidemia, unspecified: Secondary | ICD-10-CM | POA: Diagnosis not present

## 2021-09-15 DIAGNOSIS — E876 Hypokalemia: Secondary | ICD-10-CM | POA: Diagnosis not present

## 2021-09-15 LAB — BASIC METABOLIC PANEL
BUN: 22 mg/dL (ref 6–23)
CO2: 28 mEq/L (ref 19–32)
Calcium: 9.9 mg/dL (ref 8.4–10.5)
Chloride: 103 mEq/L (ref 96–112)
Creatinine, Ser: 0.79 mg/dL (ref 0.40–1.20)
GFR: 69.99 mL/min (ref >60.00)
Glucose, Bld: 100 mg/dL — ABNORMAL HIGH (ref 70–99)
Potassium: 3.6 mEq/L (ref 3.5–5.1)
Sodium: 139 mEq/L (ref 135–145)

## 2021-09-15 NOTE — Assessment & Plan Note (Signed)
BP adequately controlled. Continue current management: Amlodipine,Lisinopril-HCTZ,and metoprolol same dose. DASH/low salt diet to continue. Continue monitoring BP at home.

## 2021-09-15 NOTE — Progress Notes (Signed)
HPI: Erika Brown is a 80 y.o. female, who is here today for 6 months follow up.   She was last seen on 03/10/21.  Since her last visit she has been Dx'ed with left breast cancer and underwent left lumpectomy. She has not started antiestrogen therapy yet, she is considering not taking it. She has discussed pros and cons of medication. She has DEXA scheduled. Following with Dr Lindi Adie.  Hypertension:  Medications:Metoprolol succinate 50 mg daily,Amlodipine 5 mg daily,and Irbesartan-HCTZ 300-12.5 mg daily. BP readings at home:130's/70's. Side effects:None Negative for unusual or severe headache, visual changes, exertional chest pain, dyspnea, focal weakness, or edema.  HypoK+ on KLUR 20 meq daily.  Lab Results  Component Value Date   CREATININE 0.82 05/19/2021   BUN 22 05/19/2021   NA 137 05/19/2021   K 3.7 05/19/2021   CL 101 05/19/2021   CO2 28 05/19/2021   CAD s/p stenting in 2006. + Palpitations: Once in a while she skips a beat.  Follows with cardiologist q 6-12 months, last seen on 05/01/21.  HLD: LE's muscle aches. She decreased dose of Zetia from 1 tab to 1/2 tab. Leg myalgias improved some. She has not tolerated statins in the past.  Lately she does not feel like she has been consistent with following a healthful diet. She has been more active around the house, painting some rooms.  Lab Results  Component Value Date   CHOL 199 03/10/2021   HDL 65.40 03/10/2021   LDLCALC 115 (H) 03/10/2021   LDLDIRECT 121.7 09/08/2007   TRIG 91.0 03/10/2021   CHOLHDL 3 03/10/2021   Negative for depression but feeling sad sometimes, mainly around this time of the year, remembering her husband, who died a couple years ago. She has a good family support and has a good relation with her neighbors.  Review of Systems  Constitutional:  Negative for activity change, appetite change and fever.  HENT:  Negative for mouth sores, nosebleeds and sore throat.   Respiratory:   Negative for cough and wheezing.   Gastrointestinal:  Negative for abdominal pain, nausea and vomiting.       Negative for changes in bowel habits.  Genitourinary:  Negative for decreased urine volume and hematuria.  Neurological:  Negative for syncope, facial asymmetry and weakness.  Rest of ROS, see pertinent positives sand negatives in HPI  Current Outpatient Medications on File Prior to Visit  Medication Sig Dispense Refill   amLODipine (NORVASC) 5 MG tablet TAKE 1 TABLET ONCE DAILY. 90 tablet 3   Ascorbic Acid (VITAMIN C) 1000 MG tablet Take 1,000 mg by mouth daily.     aspirin 81 MG EC tablet Take 81 mg by mouth daily.     Biotin 10000 MCG TABS Take 10,000 mcg by mouth daily.     Cholecalciferol (VITAMIN D3) 125 MCG (5000 UT) CAPS Take 5,000 Units by mouth daily.     clopidogrel (PLAVIX) 75 MG tablet TAKE 1 TABLET ONCE DAILY. (Patient taking differently: Take 75 mg by mouth daily.) 90 tablet 3   ezetimibe (ZETIA) 10 MG tablet Take 5 mg by mouth daily.     irbesartan-hydrochlorothiazide (AVALIDE) 300-12.5 MG tablet Take 1 tablet by mouth daily. 90 tablet 3   metoprolol succinate (TOPROL-XL) 50 MG 24 hr tablet TAKE 1 TABLET ONCE DAILY. (Patient taking differently: Take 50 mg by mouth daily.) 90 tablet 3   nitroGLYCERIN (NITROSTAT) 0.4 MG SL tablet Place 1 tablet (0.4 mg total) under the tongue every 5 (five) minutes  as needed for chest pain. 25 tablet 1   Polyethyl Glycol-Propyl Glycol (SYSTANE OP) Place 1 drop into both eyes in the morning and at bedtime.     potassium chloride SA (KLOR-CON) 20 MEQ tablet TAKE 1 TABLET ONCE DAILY. (Patient taking differently: Take 20 mEq by mouth daily.) 90 tablet 3   zinc gluconate 50 MG tablet Take 50 mg by mouth daily.     No current facility-administered medications on file prior to visit.   Past Medical History:  Diagnosis Date   Anginal pain ([place])    Anxiety and depression    Breast cancer ([place])    Breast cancer ([place])    CAD (coronary artery  disease)    S/P NSTEMI 4/06 EF 60%; Treated with Cypher DES to LAD and RCA with kissing balloon PCI diagonal; Treadmill myoview 10/09 for CP: Walked 7:06. mild ST-T-wave changes in recovery. EF 71%. Very mild reversible defect in base of the inferior wall --> medical rx.   Cancer ([place])    HISTORY OF SKIN CANCER   Cataract    Complication of anesthesia    " DIFFICULT TO WAKE UP "   Depression    Family history of breast cancer 04/01/2021   Family history of colon cancer 04/01/2021   Family history of malignant melanoma 04/01/2021   Glucose intolerance (impaired glucose tolerance)    History of echocardiogram    Echo 11/17: Vigorous LVF, EF 65-70, normal wall motion, borderline diastolic function, systolic bowing of mitral valve without prolapse, mild MR, trivial TR, PASP 20   History of medication noncompliance    Hyperlipidemia    Intolerate to multiple statins due to severe myalgias   Hypertension    Possible white-coat component   MI (myocardial infarction) ([facility])    acute-s/p stent   Microhematuria    Pneumonia due to COVID-19 virus    Psoriasis    Rapid heart beat    Vitamin D deficiency    Allergies  Allergen Reactions   Iodine Shortness Of Breath and Palpitations    Has had some SHOB and rapid heart rate in the past   Latex Other (See Comments)    discomfort   Other Other (See Comments)    Some foods b/c of preservatives    Statins Other (See Comments)    Intolerance to high dosages, myalgia    Social History   Socioeconomic History   Marital status: Widowed    Spouse name: Not on file   Number of children: 7   Years of education: Not on file   Highest education level: Not on file  Occupational History   Occupation: Works at home, Charity fundraiser, 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  Tobacco Use   Smoking status: Never   Smokeless tobacco: Never   Tobacco comments:    only a few times  Vaping Use   Vaping Use: Never used  Substance and Sexual Activity   Alcohol use: Yes     Alcohol/week: 4.0 standard drinks    Types: 4 Glasses of wine per week   Drug use: No   Sexual activity: Not Currently    Birth control/protection: Abstinence, Surgical    Comment: [facility]  Other Topics Concern   Not on file  Social History Narrative   Lives in [place] with her husband   Seven kids   Plays tennis   Very good card player   Social Determinants of Health   Financial Resource Strain: Low Risk    Difficulty of Paying Living Expenses: Not hard  at all  Food Insecurity: No Food Insecurity   Worried About Charity fundraiser in the Last Year: Never true   Ran Out of Food in the Last Year: Never true  Transportation Needs: No Transportation Needs   Lack of Transportation (Medical): No   Lack of Transportation (Non-Medical): No  Physical Activity: Insufficiently Active   Days of Exercise per Week: 4 days   Minutes of Exercise per Session: 20 min  Stress: No Stress Concern Present   Feeling of Stress : Not at all  Social Connections: Moderately Integrated   Frequency of Communication with Friends and Family: More than three times a week   Frequency of Social Gatherings with Friends and Family: More than three times a week   Attends Religious Services: More than 4 times per year   Active Member of [employer] or Organizations: Yes   Attends Archivist Meetings: More than 4 times per year   Marital Status: Widowed   Vitals:   09/15/21 0803  BP: 120/70  Pulse: 87  Resp: 16  SpO2: 97%   Body mass index is 24.2 kg/m.  Physical Exam Vitals and nursing note reviewed.  Constitutional:      General: She is not in acute distress.    Appearance: She is well-developed.  HENT:     Head: Normocephalic and atraumatic.     Mouth/Throat:     Mouth: Mucous membranes are moist.     Pharynx: Oropharynx is clear.  Eyes:     Conjunctiva/sclera: Conjunctivae normal.  Cardiovascular:     Rate and Rhythm: Normal rate and regular rhythm.     Pulses:          Dorsalis pedis  pulses are 2+ on the right side and 2+ on the left side.     Heart sounds: No murmur heard. Pulmonary:     Effort: Pulmonary effort is normal. No respiratory distress.     Breath sounds: Normal breath sounds.  Abdominal:     Palpations: Abdomen is soft. There is no hepatomegaly or mass.     Tenderness: There is no abdominal tenderness.  Lymphadenopathy:     Cervical: No cervical adenopathy.  Skin:    General: Skin is warm.     Findings: No erythema or rash.  Neurological:     General: No focal deficit present.     Mental Status: She is alert and oriented to person, place, and time.     Cranial Nerves: No cranial nerve deficit.     Gait: Gait normal.  Psychiatric:     Comments: Well groomed, good eye contact.   ASSESSMENT AND PLAN:  Ms. DONAE KUEKER was seen today for 6 months follow-up.  Orders Placed This Encounter  Procedures   Basic metabolic panel   Lab Results  Component Value Date   CREATININE 0.79 09/15/2021   BUN 22 09/15/2021   NA 139 09/15/2021   K 3.6 09/15/2021   CL 103 09/15/2021   CO2 28 09/15/2021    Hyperlipidemia LDL goal <70 Continue Zetial 10 mg 1/2 tab daily and low fat diet. She has not tolerated statins in the past. Will plan on checking next visit.  Hypokalemia Continue KLOR 20 meq daily. Further recommendations according to BMP result.  Hypertension with heart disease BP adequately controlled. Continue current management: Amlodipine,Lisinopril-HCTZ,and metoprolol same dose. DASH/low salt diet to continue. Continue monitoring BP at home.  Return in about 6 months (around 03/16/2022).   Julienne Vogler G. [place], MD  [place]. [place] office.

## 2021-09-15 NOTE — Assessment & Plan Note (Signed)
Continue KLOR 20 meq daily. Further recommendations according to BMP result.

## 2021-09-15 NOTE — Patient Instructions (Addendum)
A few things to remember from today's visit:  Hypokalemia - Plan: Basic metabolic panel  Hypertension with heart disease - Plan: Basic metabolic panel  Hyperlipidemia LDL goal <70  If you need refills please call your pharmacy. Do not use My Chart to request refills or for acute issues that need immediate attention.  No changes today. Fall precautions.  Please be sure medication list is accurate. If a new problem present, please set up appointment sooner than planned today.

## 2021-09-16 NOTE — Progress Notes (Signed)
SURVIVORSHIP VISIT:  BRIEF ONCOLOGIC HISTORY:  Oncology History  Malignant neoplasm of upper-outer quadrant of left breast in female, estrogen receptor positive ([place])  03/30/2021 Initial Diagnosis   Patient palpated a left breast abnormality for 7 weeks. Diagnostic mammogram and US showed a 1.6cm mass at the 3 o'clock position in the left breast and no left axillary adenopathy. Biopsy showed invasive and in situ ductal carcinoma, grade 1, HER-2 equivocal by IHC, negative by FISH (ratio 1.45). ER/PR+ >95%, Ki67 1%.   04/01/2021 Cancer Staging   Staging form: Breast, AJCC 8th Edition - Clinical stage from 04/01/2021: Stage IA (cT1c, cN0, cM0, G1, ER+, PR+, HER2-) - Signed by Nicholas Lose, MD on 04/01/2021 Stage prefix: Initial diagnosis Histologic grading system: 3 grade system   04/13/2021 Genetic Testing   Negative hereditary cancer genetic testing: no pathogenic variants detected in [facility] CancerNext-Expanded +RNAinsight Panel.  Variant of uncertain significance detected in MSH6 at  p.T1100M (c.3299C>T).  The report date is Apr 13, 2021.    The CancerNext-Expanded gene panel offered by [facility] and includes sequencing, rearrangement, and RNA analysis for the following 77 genes: AIP, ALK, APC, ATM, AXIN2, BAP1, BARD1, BLM, BMPR1A, BRCA1, BRCA2, BRIP1, CDC73, CDH1, CDK4, CDKN1B, CDKN2A, CHEK2, CTNNA1, DICER1, FANCC, FH, FLCN, GALNT12, KIF1B, LZTR1, MAX, MEN1, MET, MLH1, MSH2, MSH3, MSH6, MUTYH, NBN, NF1, NF2, NTHL1, PALB2, PHOX2B, PMS2, POT1, PRKAR1A, PTCH1, PTEN, RAD51C, RAD51D, RB1, RECQL, RET, SDHA, SDHAF2, SDHB, SDHC, SDHD, SMAD4, SMARCA4, SMARCB1, SMARCE1, STK11, SUFU, TMEM127, TP53, TSC1, TSC2, VHL and XRCC2 (sequencing and deletion/duplication); EGFR, EGLN1, HOXB13, KIT, MITF, PDGFRA, POLD1, and POLE (sequencing only); EPCAM and GREM1 (deletion/duplication only).    05/21/2021 Surgery   Left Lumpectomy: Grade 1 IDC 1.8 cm. LG DCIS, margins neg, HER-2 equivocal by IHC, negative by FISH (ratio  1.45). ER/PR+ >95%, Ki67 1%.     INTERVAL HISTORY:  Erika Brown to review her survivorship care plan detailing her treatment course for breast cancer, as well as monitoring long-term side effects of that treatment, education regarding health maintenance, screening, and overall wellness and health promotion.     Overall, Erika Brown reports feeling quite well.  She completed surgery in June and has been doing well since then.    REVIEW OF SYSTEMS:  Review of Systems  Constitutional:  Negative for appetite change, chills, fatigue, fever and unexpected weight change.  HENT:   Negative for hearing loss, lump/mass and trouble swallowing.   Eyes:  Negative for eye problems and icterus.  Respiratory:  Negative for chest tightness, cough and shortness of breath.   Cardiovascular:  Negative for chest pain, leg swelling and palpitations.  Gastrointestinal:  Negative for abdominal distention, abdominal pain, constipation, diarrhea, nausea and vomiting.  Endocrine: Negative for hot flashes.  Genitourinary:  Negative for difficulty urinating.   Musculoskeletal:  Negative for arthralgias.  Skin:  Negative for itching and rash.  Neurological:  Negative for dizziness, extremity weakness, headaches and numbness.  Hematological:  Negative for adenopathy. Does not bruise/bleed easily.  Psychiatric/Behavioral:  Negative for depression. The patient is not nervous/anxious.   Breast: Denies any new nodularity, masses, tenderness, nipple changes, or nipple discharge.    ONCOLOGY TREATMENT TEAM:  1. Surgeon:  Dr. Donne Hazel at [facility] 2. Medical Oncologist: Dr. Lindi Adie  3. Radiation Oncologist: Dr. Lisbeth Renshaw    PAST MEDICAL/SURGICAL HISTORY:  Past Medical History:  Diagnosis Date   Anginal pain ([place])    Anxiety and depression    Breast cancer ([place])    Breast cancer ([place])  CAD (coronary artery disease)    S/P NSTEMI 4/06 EF 60%; Treated with Cypher DES to LAD and RCA with kissing balloon PCI  diagonal; Treadmill myoview 10/09 for CP: Walked 7:06. mild ST-T-wave changes in recovery. EF 71%. Very mild reversible defect in base of the inferior wall --> medical rx.   Cancer ([place])    HISTORY OF SKIN CANCER   Cataract    Complication of anesthesia    " DIFFICULT TO WAKE UP "   Depression    Family history of breast cancer 04/01/2021   Family history of colon cancer 04/01/2021   Family history of malignant melanoma 04/01/2021   Glucose intolerance (impaired glucose tolerance)    History of echocardiogram    Echo 11/17: Vigorous LVF, EF 65-70, normal wall motion, borderline diastolic function, systolic bowing of mitral valve without prolapse, mild MR, trivial TR, PASP 20   History of medication noncompliance    Hyperlipidemia    Intolerate to multiple statins due to severe myalgias   Hypertension    Possible white-coat component   MI (myocardial infarction) ([place])    acute-s/p stent   Microhematuria    Pneumonia due to COVID-19 virus    Psoriasis    Rapid heart beat    Vitamin D deficiency    Past Surgical History:  Procedure Laterality Date   ABDOMINAL HYSTERECTOMY     and anterior repair   APPENDECTOMY     bladder tact     BREAST BIOPSY     BREAST LUMPECTOMY WITH RADIOACTIVE SEED LOCALIZATION Left 05/21/2021   Procedure: LEFT BREAST LUMPECTOMY WITH RADIOACTIVE SEED LOCALIZATION;  Surgeon: Rolm Bookbinder, MD;  Location: [facility];  Service: General;  Laterality: Left;   CARDIAC CATHETERIZATION  02/28/2018   CATARACT EXTRACTION Bilateral 2017   Dr. Katy Fitch   CORONARY STENT INTERVENTION N/A 02/28/2018   Procedure: CORONARY STENT INTERVENTION;  Surgeon: Jettie Booze, MD;  Location: [facility];  Service: Cardiovascular;  Laterality: N/A;   CORONARY STENT PLACEMENT     CORONARY STENT PLACEMENT  04/02/019   EYE SURGERY Bilateral    cataract   HEMORRHOID SURGERY     LEFT HEART CATH AND CORONARY ANGIOGRAPHY N/A 02/28/2018   Procedure: LEFT HEART CATH AND  CORONARY ANGIOGRAPHY;  Surgeon: Jettie Booze, MD;  Location: [facility];  Service: Cardiovascular;  Laterality: N/A;   MOHS SURGERY  07/2013   BCC.  [facility]   RECTAL PROLAPSE REPAIR  1976   TONSILLECTOMY AND ADENOIDECTOMY     ULTRASOUND GUIDANCE FOR VASCULAR ACCESS  02/28/2018   Procedure: Ultrasound Guidance For Vascular Access;  Surgeon: Jettie Booze, MD;  Location: [facility];  Service: Cardiovascular;;     ALLERGIES:  Allergies  Allergen Reactions   Iodine Shortness Of Breath and Palpitations    Has had some SHOB and rapid heart rate in the past   Latex Other (See Comments)    discomfort   Other Other (See Comments)    Some foods b/c of preservatives    Statins Other (See Comments)    Intolerance to high dosages, myalgia     CURRENT MEDICATIONS:  Outpatient Encounter Medications as of 09/17/2021  Medication Sig Note   amLODipine (NORVASC) 5 MG tablet TAKE 1 TABLET ONCE DAILY.    Ascorbic Acid (VITAMIN C) 1000 MG tablet Take 1,000 mg by mouth daily.    aspirin 81 MG EC tablet Take 81 mg by mouth daily.    Biotin 10000 MCG  TABS Take 10,000 mcg by mouth daily.    Cholecalciferol (VITAMIN D3) 125 MCG (5000 UT) CAPS Take 5,000 Units by mouth daily.    clopidogrel (PLAVIX) 75 MG tablet TAKE 1 TABLET ONCE DAILY. (Patient taking differently: Take 75 mg by mouth daily.) 05/15/2021: Will hold starting 05/16/21   ezetimibe (ZETIA) 10 MG tablet Take 5 mg by mouth daily.    irbesartan-hydrochlorothiazide (AVALIDE) 300-12.5 MG tablet Take 1 tablet by mouth daily.    metoprolol succinate (TOPROL-XL) 50 MG 24 hr tablet TAKE 1 TABLET ONCE DAILY. (Patient taking differently: Take 50 mg by mouth daily.)    Polyethyl Glycol-Propyl Glycol (SYSTANE OP) Place 1 drop into both eyes in the morning and at bedtime.    potassium chloride SA (KLOR-CON) 20 MEQ tablet TAKE 1 TABLET ONCE DAILY. (Patient taking differently: Take 20 mEq by mouth daily.)    zinc gluconate 50 MG  tablet Take 50 mg by mouth daily.    nitroGLYCERIN (NITROSTAT) 0.4 MG SL tablet Place 1 tablet (0.4 mg total) under the tongue every 5 (five) minutes as needed for chest pain.    No facility-administered encounter medications on file as of 09/17/2021.     ONCOLOGIC FAMILY HISTORY:  Family History  Problem Relation Age of Onset   Heart disease Father    Heart attack Father    Ulcers Father    Hypertension Mother    Melanoma Mother        mets? bottom of foot; dx 24s   Melanoma Daughter        metastatic; left arm; d. 48   Stroke Maternal Grandmother    Hypertension Maternal Grandmother    Stroke Maternal Grandfather    Renal Disease Maternal Grandfather        kidney removed   Hypertension Maternal Grandfather    Diabetes Paternal Grandmother    Breast cancer Sister 31       mets   Lung cancer Sister        dx 72s   Hypertension Brother    Asthma Brother    Depression Daughter        x2   Breast cancer Daughter 24   Colon cancer Son 36   Coronary artery disease Neg Hx        SOCIAL HISTORY:  Social History   Socioeconomic History   Marital status: Widowed    Spouse name: Not on file   Number of children: 7   Years of education: Not on file   Highest education level: Not on file  Occupational History   Occupation: Works at home, Charity fundraiser, 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  Tobacco Use   Smoking status: Never   Smokeless tobacco: Never   Tobacco comments:    only a few times  Vaping Use   Vaping Use: Never used  Substance and Sexual Activity   Alcohol use: Yes    Alcohol/week: 4.0 standard drinks    Types: 4 Glasses of wine per week   Drug use: No   Sexual activity: Not Currently    Birth control/protection: Abstinence, Surgical    Comment: [facility]  Other Topics Concern   Not on file  Social History Narrative   Lives in [place] with her husband   Seven kids   Plays tennis   Very good card player   Social Determinants of Health   Financial Resource Strain: Low  Risk    Difficulty of Paying Living Expenses: Not hard at all  Food Insecurity: No Food Insecurity  Worried About Charity fundraiser in the Last Year: Never true   [place] in the Last Year: Never true  Transportation Needs: No Transportation Needs   Lack of Transportation (Medical): No   Lack of Transportation (Non-Medical): No  Physical Activity: Insufficiently Active   Days of Exercise per Week: 4 days   Minutes of Exercise per Session: 20 min  Stress: No Stress Concern Present   Feeling of Stress : Not at all  Social Connections: Moderately Integrated   Frequency of Communication with Friends and Family: More than three times a week   Frequency of Social Gatherings with Friends and Family: More than three times a week   Attends Religious Services: More than 4 times per year   Active Member of [employer] or Organizations: Yes   Attends Archivist Meetings: More than 4 times per year   Marital Status: Widowed  Human resources officer Violence: Not At Risk   Fear of Current or Ex-Partner: No   Emotionally Abused: No   Physically Abused: No   Sexually Abused: No     OBSERVATIONS/OBJECTIVE:  BP 124/72 (BP Location: Left Arm, Patient Position: Sitting)   Pulse 72   Temp (!) 97.4 F (36.3 C) (Tympanic)   Resp 18   Ht _0  (1.626 m)   Wt 142 lb (64.4 kg)   LMP 11/29/1974   SpO2 99%   BMI 24.37 kg/m  GENERAL: Patient is a well appearing female in no acute distress HEENT:  Sclerae anicteric.  Oropharynx clear and moist. No ulcerations or evidence of oropharyngeal candidiasis. Neck is supple.  NODES:  No cervical, supraclavicular, or axillary lymphadenopathy palpated.  BREAST EXAM:  left breast s/p lumpectomy, no sign of local recurrence, right breast benign LUNGS:  Clear to auscultation bilaterally.  No wheezes or rhonchi. HEART:  Regular rate and rhythm. No murmur appreciated. ABDOMEN:  Soft, nontender.  Positive, normoactive bowel sounds. No organomegaly  palpated. MSK:  No focal spinal tenderness to palpation. Full range of motion bilaterally in the upper extremities. EXTREMITIES:  No peripheral edema.   SKIN:  Clear with no obvious rashes or skin changes. No nail dyscrasia. NEURO:  Nonfocal. Well oriented.  Appropriate affect.   LABORATORY DATA:  None for this visit.  DIAGNOSTIC IMAGING:  None for this visit.      ASSESSMENT AND PLAN:  Ms.. Brown is a pleasant 79 y.o. female with Stage IA left breast invasive ductal carcinoma, ER+/PR+/HER2-, diagnosed in 03/2021, treated with lumpectomy, declined antiestrogen therapy.  She presents to the Survivorship Clinic for our initial meeting and routine follow-up post-completion of treatment for breast cancer.    1. Stage IA left breast cancer:  Erika Brown is continuing to recover from definitive treatment for breast cancer. She will follow-up with her medical oncologist, Dr. Lindi Adie in 6 months with history and physical exam per surveillance protocol.  Her mammogram is due 02/2022; orders placed today. Today, a comprehensive survivorship care plan and treatment summary was reviewed with the patient today detailing her breast cancer diagnosis, treatment course, potential late/long-term effects of treatment, appropriate follow-up care with recommendations for the future, and patient education resources.  A copy of this summary, along with a letter will be sent to the patient's primary care provider via mail/fax/In Basket message after today's visit.    2. Bone health:  Bone density testing is scheduled in January, 2023. She was given education on specific activities to promote bone health.  3. Cancer screening:  Due  to Erika Brown history and her age, she should receive screening for skin cancers, colon cancer, and gynecologic cancers.  The information and recommendations are listed on the patient's comprehensive care plan/treatment summary and were reviewed in detail with the patient.    4. Health  maintenance and wellness promotion: Erika Brown was encouraged to consume 5-7 servings of fruits and vegetables per day. We reviewed the "Nutrition Rainbow" handout, as well as the handout "Take Control of Your Health and Reduce Your Cancer Risk" from the [place].  She was also encouraged to engage in moderate to vigorous exercise for 30 minutes per day most days of the week. We discussed the [employer] fitness program, which is designed for cancer survivors to help them become more physically fit after cancer treatments.  She was instructed to limit her alcohol consumption and continue to abstain from tobacco use.     5. Support services/counseling: It is not uncommon for this period of the patient's cancer care trajectory to be one of many emotions and stressors.  We discussed how this can be increasingly difficult during the times of quarantine and social distancing due to the COVID-19 pandemic.   She was given information regarding our available services and encouraged to contact me with any questions or for help enrolling in any of our support group/programs.    Follow up instructions:    -Return to cancer center in 6 months for f/u with Dr. Lindi Adie  -Mammogram due in 02/2022 -Bone Density 12/16/2021 -Follow up with Dr. Donne Hazel in 1 year -She is welcome to return back to the Survivorship Clinic at any time; no additional follow-up needed at this time.  -Consider referral back to survivorship as a long-term survivor for continued surveillance  The patient was provided an opportunity to ask questions and all were answered. The patient agreed with the plan and demonstrated an understanding of the instructions.   Total encounter time: 30 minutes of face to face visit time, chart review, lab review, order entry and care coordination.    Wilber Bihari, NP 09/18/21 6:24 PM Medical Oncology and Hematology [facility] [address] [place], [place] [address] Tel.  [phone]    Fax. [phone]  *Total Encounter Time as defined by the Centers for Medicare and Medicaid Services includes, in addition to the face-to-face time of a patient visit (documented in the note above) non-face-to-face time: obtaining and reviewing outside history, ordering and reviewing medications, tests or procedures, care coordination (communications with other health care professionals or caregivers) and documentation in the medical record.

## 2021-09-17 ENCOUNTER — Inpatient Hospital Stay: Payer: Medicare HMO | Attending: Adult Health | Admitting: Adult Health

## 2021-09-17 ENCOUNTER — Other Ambulatory Visit: Payer: Self-pay

## 2021-09-17 ENCOUNTER — Encounter: Payer: Self-pay | Admitting: Adult Health

## 2021-09-17 VITALS — BP 124/72 | HR 72 | Temp 97.4°F | Resp 18 | Ht 64.0 in | Wt 142.0 lb

## 2021-09-17 DIAGNOSIS — I252 Old myocardial infarction: Secondary | ICD-10-CM | POA: Insufficient documentation

## 2021-09-17 DIAGNOSIS — I2510 Atherosclerotic heart disease of native coronary artery without angina pectoris: Secondary | ICD-10-CM | POA: Insufficient documentation

## 2021-09-17 DIAGNOSIS — C50412 Malignant neoplasm of upper-outer quadrant of left female breast: Secondary | ICD-10-CM | POA: Diagnosis not present

## 2021-09-17 DIAGNOSIS — Z800 Family history of malignant neoplasm of digestive organs: Secondary | ICD-10-CM | POA: Insufficient documentation

## 2021-09-17 DIAGNOSIS — Z8616 Personal history of COVID-19: Secondary | ICD-10-CM | POA: Insufficient documentation

## 2021-09-17 DIAGNOSIS — E559 Vitamin D deficiency, unspecified: Secondary | ICD-10-CM | POA: Insufficient documentation

## 2021-09-17 DIAGNOSIS — Z803 Family history of malignant neoplasm of breast: Secondary | ICD-10-CM | POA: Insufficient documentation

## 2021-09-17 DIAGNOSIS — Z801 Family history of malignant neoplasm of trachea, bronchus and lung: Secondary | ICD-10-CM | POA: Insufficient documentation

## 2021-09-17 DIAGNOSIS — Z85828 Personal history of other malignant neoplasm of skin: Secondary | ICD-10-CM | POA: Insufficient documentation

## 2021-09-17 DIAGNOSIS — Z170 Estrogen receptor positive status [ER+]: Secondary | ICD-10-CM

## 2021-09-17 DIAGNOSIS — E785 Hyperlipidemia, unspecified: Secondary | ICD-10-CM | POA: Diagnosis not present

## 2021-09-17 DIAGNOSIS — Z7982 Long term (current) use of aspirin: Secondary | ICD-10-CM | POA: Insufficient documentation

## 2021-09-17 DIAGNOSIS — C50411 Malignant neoplasm of upper-outer quadrant of right female breast: Secondary | ICD-10-CM | POA: Insufficient documentation

## 2021-09-17 DIAGNOSIS — Z79899 Other long term (current) drug therapy: Secondary | ICD-10-CM | POA: Diagnosis not present

## 2021-09-17 DIAGNOSIS — I10 Essential (primary) hypertension: Secondary | ICD-10-CM | POA: Diagnosis not present

## 2021-10-08 ENCOUNTER — Other Ambulatory Visit: Payer: Self-pay | Admitting: Interventional Cardiology

## 2021-10-14 DIAGNOSIS — L820 Inflamed seborrheic keratosis: Secondary | ICD-10-CM | POA: Diagnosis not present

## 2021-10-14 DIAGNOSIS — Z85828 Personal history of other malignant neoplasm of skin: Secondary | ICD-10-CM | POA: Diagnosis not present

## 2021-10-14 DIAGNOSIS — L905 Scar conditions and fibrosis of skin: Secondary | ICD-10-CM | POA: Diagnosis not present

## 2021-11-01 NOTE — Progress Notes (Signed)
Cardiology Office Note   Date:  11/03/2021   ID:  Erika, Brown 02-07-1941, MRN [id number]  PCP:  Martin[place], Betty G, MD    No chief complaint on file.  CAD  Wt Readings from Last 3 Encounters:  11/03/21 141 lb 12.8 oz (64.3 kg)  09/17/21 142 lb (64.4 kg)  09/15/21 141 lb (64 kg)       History of Present Illness: Erika Brown is a 80 y.o. female  with CAD who is  aformer patient of Dr. Saunders Revel with coronary artery disease status post Cypher DES to the LAD and Cypher DES to the RCA as well as balloon angioplasty of the diagonal in 03-05-2005 in the setting of non-ST elevation myocardial infarction, hypertension, hyperlipidemia.  Most recently, she underwent cardiac catheterization in April 2019 after an abnormal stress test.  This demonstrated in-stent restenosis in the LAD as well as severe stenosis in the LCx and RCA.  She underwent multivessel stenting with a DES to the proximal-mid LAD, DES to the mid LCx and DES to the proximal-mid RCA.  Last seen by Dr. Saunders Revel in July 2019.  At that time, she complained of some palpitations and her beta-blocker dose was adjusted.  They also discussed +/- ezetimibe therapy.  She has intolerance to multiple statins and is not inclined to try PCSK-9 inhibitor therapy.   She had three vessel stenting in April 2019.  After 05-Mar-2018, she started following up with me.   SHe was trying to eat healthier.  She was decreasing meat intake.  She started Zetia in 2023-10-09.       Her husband died from [place] in early March 05, 2020.  He was diagnosed with COVID in early Jan.  She got COVID from him at a nursing home. She was hospitalized for 4 days.  She received steroids, remdesivir.    In Feb 2021, it was noted: "She feels that she is having nosebleeds.  HR seems to be higher in general. Even now, while she is talking, HR is 103. Using oxygen at night, 2L. "   I recommended a humidifier to help.    Son has long [place].     She was diagnosed with Breast cancer. Has been treated.   Following with Dr. Lindi Adie.    Likes to garden- still works in the yard even in the [employer].   Denies : Chest pain. Dizziness. Leg edema. Nitroglycerin use. Orthopnea. Palpitations. Paroxysmal nocturnal dyspnea. Shortness of breath. Syncope.    Eats a lot of fiber.  Decreased meat intake.  Walks a lot in her house up the stairs.      Past Medical History:  Diagnosis Date   Anginal pain ([place])    Anxiety and depression    Breast cancer ([place])    Breast cancer ([place])    CAD (coronary artery disease)    S/P NSTEMI 4/06 EF 60%; Treated with Cypher DES to LAD and RCA with kissing balloon PCI diagonal; Treadmill myoview 10/09 for CP: Walked 7:06. mild ST-T-wave changes in recovery. EF 71%. Very mild reversible defect in base of the inferior wall --> medical rx.   Cancer [facility])    HISTORY OF SKIN CANCER   Cataract    Complication of anesthesia    " DIFFICULT TO WAKE UP "   Depression    Family history of breast cancer 04/01/2021   Family history of colon cancer 04/01/2021   Family history of malignant melanoma 04/01/2021   Glucose intolerance (impaired glucose  tolerance)    History of echocardiogram    Echo 11/17: Vigorous LVF, EF 65-70, normal wall motion, borderline diastolic function, systolic bowing of mitral valve without prolapse, mild MR, trivial TR, PASP 20   History of medication noncompliance    Hyperlipidemia    Intolerate to multiple statins due to severe myalgias   Hypertension    Possible white-coat component   MI (myocardial infarction) ([place])    acute-s/p stent   Microhematuria    Pneumonia due to COVID-19 virus    Psoriasis    Rapid heart beat    Vitamin D deficiency     Past Surgical History:  Procedure Laterality Date   ABDOMINAL HYSTERECTOMY     and anterior repair   APPENDECTOMY     bladder tact     BREAST BIOPSY     BREAST LUMPECTOMY WITH RADIOACTIVE SEED LOCALIZATION Left 05/21/2021   Procedure: LEFT BREAST LUMPECTOMY WITH RADIOACTIVE SEED  LOCALIZATION;  Surgeon: Rolm Bookbinder, MD;  Location: [facility];  Service: General;  Laterality: Left;   CARDIAC CATHETERIZATION  02/28/2018   CATARACT EXTRACTION Bilateral 2017   Dr. Katy Fitch   CORONARY STENT INTERVENTION N/A 02/28/2018   Procedure: CORONARY STENT INTERVENTION;  Surgeon: Jettie Booze, MD;  Location: [facility];  Service: Cardiovascular;  Laterality: N/A;   CORONARY STENT PLACEMENT     CORONARY STENT PLACEMENT  04/02/019   EYE SURGERY Bilateral    cataract   HEMORRHOID SURGERY     LEFT HEART CATH AND CORONARY ANGIOGRAPHY N/A 02/28/2018   Procedure: LEFT HEART CATH AND CORONARY ANGIOGRAPHY;  Surgeon: Jettie Booze, MD;  Location: [facility];  Service: Cardiovascular;  Laterality: N/A;   MOHS SURGERY  07/2013   [facility].  [facility]   RECTAL PROLAPSE REPAIR  1976   TONSILLECTOMY AND ADENOIDECTOMY     ULTRASOUND GUIDANCE FOR VASCULAR ACCESS  02/28/2018   Procedure: Ultrasound Guidance For Vascular Access;  Surgeon: Jettie Booze, MD;  Location: [facility];  Service: Cardiovascular;;     Current Outpatient Medications  Medication Sig Dispense Refill   amLODipine (NORVASC) 5 MG tablet TAKE 1 TABLET ONCE DAILY. 90 tablet 3   Ascorbic Acid (VITAMIN C) 1000 MG tablet Take 1,000 mg by mouth daily.     aspirin 81 MG EC tablet Take 81 mg by mouth daily.     Biotin 10000 MCG TABS Take 10,000 mcg by mouth daily.     Cholecalciferol (VITAMIN D3) 125 MCG (5000 UT) CAPS Take 5,000 Units by mouth daily.     clopidogrel (PLAVIX) 75 MG tablet TAKE 1 TABLET ONCE DAILY. (Patient taking differently: Take 75 mg by mouth daily.) 90 tablet 3   ezetimibe (ZETIA) 10 MG tablet Take 0.5 tablets (5 mg total) by mouth daily. 45 tablet 2   irbesartan-hydrochlorothiazide (AVALIDE) 300-12.5 MG tablet Take 1 tablet by mouth daily. 90 tablet 3   metoprolol succinate (TOPROL-XL) 50 MG 24 hr tablet TAKE 1 TABLET ONCE DAILY. (Patient taking differently: Take 50 mg by  mouth daily.) 90 tablet 3   nitroGLYCERIN (NITROSTAT) 0.4 MG SL tablet Place 1 tablet (0.4 mg total) under the tongue every 5 (five) minutes as needed for chest pain. 25 tablet 1   potassium chloride SA (KLOR-CON) 20 MEQ tablet TAKE 1 TABLET ONCE DAILY. (Patient taking differently: Take 20 mEq by mouth daily.) 90 tablet 3   zinc gluconate 50 MG tablet Take 50 mg by mouth daily.     Polyethyl  Glycol-Propyl Glycol (SYSTANE OP) Place 1 drop into both eyes in the morning and at bedtime. (Patient not taking: Reported on 11/03/2021)     No current facility-administered medications for this visit.    Allergies:   Iodine, Latex, Other, and Statins    Social History:  The patient  reports that she has never smoked. She has never used smokeless tobacco. She reports current alcohol use of about 4.0 standard drinks per week. She reports that she does not use drugs.   Family History:  The patient's family history includes Asthma in her brother; Breast cancer (age of onset: 70) in her daughter; Breast cancer (age of onset: 20) in her sister; Colon cancer (age of onset: 25) in her son; Depression in her daughter; Diabetes in her paternal grandmother; Heart attack in her father; Heart disease in her father; Hypertension in her brother, maternal grandfather, maternal grandmother, and mother; Lung cancer in her sister; Melanoma in her daughter and mother; Renal Disease in her maternal grandfather; Stroke in her maternal grandfather and maternal grandmother; Ulcers in her father.    ROS:  Please see the history of present illness.   Otherwise, review of systems are positive for arm and leg aching so she decreased Zetia to 5 mg daily; had improvement a few days later.   All other systems are reviewed and negative.    PHYSICAL EXAM: VS:  BP 130/90 Comment: recheck BP 112/80  Pulse 67   Ht 5' 4.5" (1.638 m)   Wt 141 lb 12.8 oz (64.3 kg)   LMP 11/29/1974   SpO2 95%   BMI 23.96 kg/m  , BMI Body mass index is  23.96 kg/m. GEN: Well nourished, well developed, in no acute distress HEENT: normal Neck: no JVD, carotid bruits, or masses Cardiac: RRR; no murmurs, rubs, or gallops,no edema  Respiratory:  clear to auscultation bilaterally, normal work of breathing GI: soft, nontender, nondistended, + BS MS: no deformity or atrophy Skin: warm and dry, no rash Neuro:  Strength and sensation are intact Psych: euthymic mood, full affect    Recent Labs: 04/01/2021: ALT 18 05/19/2021: Hemoglobin 12.7; Platelets 213 09/15/2021: BUN 22; Creatinine, Ser 0.79; Potassium 3.6; Sodium 139   Lipid Panel    Component Value Date/Time   CHOL 199 03/10/2021 0837   CHOL 207 (H) 01/26/2019 0921   TRIG 91.0 03/10/2021 0837   HDL 65.40 03/10/2021 0837   HDL 87 01/26/2019 0921   CHOLHDL 3 03/10/2021 0837   VLDL 18.2 03/10/2021 0837   LDLCALC 115 (H) 03/10/2021 0837   LDLCALC 108 (H) 01/26/2019 0921   LDLDIRECT 121.7 09/08/2007 0852     Other studies Reviewed: Additional studies/ records that were reviewed today with results demonstrating: .   ASSESSMENT AND PLAN:  CAD: Continue aggressive secondary prevention.  Given three-vessel stenting, have continue dual antiplatelet therapy.  Lifestyle recommendations as noted below. No angina.   Hyperlipidemia: Intolerant of statins.  Now on Zetia.  Whole food, plant-based diet.  Consider PCSK9 inhibitor.  Lipid clinic referral. LDL 115 in 02/2021.  Hesitant of injections.  Check lipids today.  HTN: Low-salt diet.  Avoid processed foods.  Home readings in the 120/70s.   Mitral regurgitation: No CHF sx.    Current medicines are reviewed at length with the patient today.  The patient concerns regarding her medicines were addressed.  The following changes have been made:  No change  Labs/ tests ordered today include:  No orders of the defined types were placed in this  encounter.   Recommend 150 minutes/week of aerobic exercise Low fat, low carb, high fiber diet  recommended  Disposition:   FU in 6 months   Signed, Larae Grooms, MD  11/03/2021 9:41 AM    [facility] [address], [place], [place]  [address] Phone: [phone]; Fax: [phone]

## 2021-11-03 ENCOUNTER — Ambulatory Visit: Payer: Medicare HMO | Admitting: Interventional Cardiology

## 2021-11-03 ENCOUNTER — Encounter: Payer: Self-pay | Admitting: Interventional Cardiology

## 2021-11-03 ENCOUNTER — Other Ambulatory Visit: Payer: Self-pay

## 2021-11-03 VITALS — BP 130/90 | HR 67 | Ht 64.5 in | Wt 141.8 lb

## 2021-11-03 DIAGNOSIS — I10 Essential (primary) hypertension: Secondary | ICD-10-CM | POA: Diagnosis not present

## 2021-11-03 DIAGNOSIS — E785 Hyperlipidemia, unspecified: Secondary | ICD-10-CM

## 2021-11-03 DIAGNOSIS — I2510 Atherosclerotic heart disease of native coronary artery without angina pectoris: Secondary | ICD-10-CM

## 2021-11-03 DIAGNOSIS — I340 Nonrheumatic mitral (valve) insufficiency: Secondary | ICD-10-CM | POA: Diagnosis not present

## 2021-11-03 LAB — LIPID PANEL
Chol/HDL Ratio: 2.5 ratio (ref 0.0–4.4)
Cholesterol, Total: 200 mg/dL — ABNORMAL HIGH (ref 100–199)
HDL: 79 mg/dL (ref >39)
LDL Chol Calc (NIH): 109 mg/dL — ABNORMAL HIGH (ref 0–99)
Triglycerides: 66 mg/dL (ref 0–149)
VLDL Cholesterol Cal: 12 mg/dL (ref 5–40)

## 2021-11-03 LAB — HEPATIC FUNCTION PANEL
ALT: 13 IU/L (ref 0–32)
AST: 16 IU/L (ref 0–40)
Albumin: 4.3 g/dL (ref 3.6–4.6)
Alkaline Phosphatase: 90 IU/L (ref 44–121)
Bilirubin Total: 0.4 mg/dL (ref 0.0–1.2)
Bilirubin, Direct: 0.11 mg/dL (ref 0.00–0.40)
Total Protein: 6.4 g/dL (ref 6.0–8.5)

## 2021-11-03 NOTE — Patient Instructions (Signed)
Medication Instructions:  Your physician recommends that you continue on your current medications as directed. Please refer to the Current Medication list given to you today.  *If you need a refill on your cardiac medications before your next appointment, please call your pharmacy*   Lab Work: Lab work to be done today--Lipid and liver profiles If you have labs (blood work) drawn today and your tests are completely normal, you will receive your results only by: [place] (if you have MyChart) OR A paper copy in the mail If you have any lab test that is abnormal or we need to change your treatment, we will call you to review the results.   Testing/Procedures: none   Follow-Up: At [facility], you and your health needs are our priority.  As part of our continuing mission to provide you with exceptional heart care, we have created designated Provider Care Teams.  These Care Teams include your primary Cardiologist (physician) and Advanced Practice Providers (APPs -  Physician Assistants and Nurse Practitioners) who all work together to provide you with the care you need, when you need it.  We recommend signing up for the patient portal called "MyChart".  Sign up information is provided on this After Visit Summary.  MyChart is used to connect with patients for Virtual Visits (Telemedicine).  Patients are able to view lab/test results, encounter notes, upcoming appointments, etc.  Non-urgent messages can be sent to your provider as well.   To learn more about what you can do with MyChart, go to [email or web address].    Your next appointment:   6 month(s)  The format for your next appointment:   In Person  Provider:   Larae Grooms, MD     Other Instructions You have been referred to the lipid clinic.  Please schedule new patient appointment

## 2021-11-24 ENCOUNTER — Telehealth: Payer: Self-pay | Admitting: Family Medicine

## 2021-11-24 NOTE — Telephone Encounter (Signed)
Left message for patient to call back and schedule Medicare Annual Wellness Visit (AWV) either virtually or in office. Left  my [name] number [phone]   Last AWVi 12/11/20  please schedule at anytime with [facility] Nurse Health Advisor 1 or 2   This should be a 45 minute visit.

## 2021-12-16 ENCOUNTER — Ambulatory Visit
Admission: RE | Admit: 2021-12-16 | Discharge: 2021-12-16 | Disposition: A | Discharge status: Home / Self Care | Payer: Medicare HMO | Source: Ambulatory Visit | Attending: Hematology and Oncology | Admitting: Hematology and Oncology

## 2021-12-16 DIAGNOSIS — M810 Age-related osteoporosis without current pathological fracture: Secondary | ICD-10-CM | POA: Diagnosis not present

## 2021-12-16 DIAGNOSIS — Z780 Asymptomatic menopausal state: Secondary | ICD-10-CM | POA: Diagnosis not present

## 2022-01-12 ENCOUNTER — Telehealth: Payer: Self-pay | Admitting: Interventional Cardiology

## 2022-01-12 ENCOUNTER — Other Ambulatory Visit: Payer: Self-pay | Admitting: Interventional Cardiology

## 2022-01-12 NOTE — Telephone Encounter (Signed)
°*  STAT* If patient is at the pharmacy, call can be transferred to refill team.   1. Which medications need to be refilled? (please list name of each medication and dose if known)  amLODipine (NORVASC) 5 MG tablet aspirin 81 MG EC tablet clopidogrel (PLAVIX) 75 MG tablet ezetimibe (ZETIA) 10 MG tablet irbesartan-hydrochlorothiazide (AVALIDE) 300-12.5 MG tablet metoprolol succinate (TOPROL-XL) 50 MG 24 hr tablet potassium chloride SA (KLOR-CON) 20 MEQ tablet  2. Which pharmacy/location (including street and city if local pharmacy) is medication to be sent to? [place], [place]  3. Do they need a 30 day or 90 day supply? 90 with refills  Patient is NOT using [facility] any more. All medications need to go to the mail order pharmacy

## 2022-01-12 NOTE — Telephone Encounter (Signed)
Called patient and spoke to her. She has upcoming appt with Dr Irish Lack in June. She was told that she would have to use mail order pharmacy, but prefers to stick with [facility]. Pharmacy there has refilled her Rx's for 30 days and copay was zero dollars. Advised pt that she should attempt to refill at least a week early to see if ins allows another fill or if the first fill is all they will pay for before using mail order. Pt agrees.

## 2022-01-21 DIAGNOSIS — D225 Melanocytic nevi of trunk: Secondary | ICD-10-CM | POA: Diagnosis not present

## 2022-01-21 DIAGNOSIS — D1801 Hemangioma of skin and subcutaneous tissue: Secondary | ICD-10-CM | POA: Diagnosis not present

## 2022-01-21 DIAGNOSIS — L438 Other lichen planus: Secondary | ICD-10-CM | POA: Diagnosis not present

## 2022-01-21 DIAGNOSIS — L820 Inflamed seborrheic keratosis: Secondary | ICD-10-CM | POA: Diagnosis not present

## 2022-01-21 DIAGNOSIS — Z85828 Personal history of other malignant neoplasm of skin: Secondary | ICD-10-CM | POA: Diagnosis not present

## 2022-01-21 DIAGNOSIS — L570 Actinic keratosis: Secondary | ICD-10-CM | POA: Diagnosis not present

## 2022-01-21 DIAGNOSIS — L821 Other seborrheic keratosis: Secondary | ICD-10-CM | POA: Diagnosis not present

## 2022-01-21 DIAGNOSIS — L738 Other specified follicular disorders: Secondary | ICD-10-CM | POA: Diagnosis not present

## 2022-01-21 DIAGNOSIS — L814 Other melanin hyperpigmentation: Secondary | ICD-10-CM | POA: Diagnosis not present

## 2022-02-02 ENCOUNTER — Ambulatory Visit (INDEPENDENT_AMBULATORY_CARE_PROVIDER_SITE_OTHER): Payer: Medicare HMO

## 2022-02-02 VITALS — BP 110/64 | HR 80 | Temp 98.2°F | Ht 64.0 in | Wt 145.0 lb

## 2022-02-02 DIAGNOSIS — Z0000 Encounter for general adult medical examination without abnormal findings: Secondary | ICD-10-CM | POA: Diagnosis not present

## 2022-02-02 NOTE — Patient Instructions (Signed)
[name] , Thank you for taking time to come for your Medicare Wellness Visit. I appreciate your ongoing commitment to your health goals. Please review the following plan we discussed and let me know if I can assist you in the future.   Screening recommendations/referrals: Colonoscopy: not required Mammogram: completed 03/20/2021, due 03/21/2022 Bone Density: completed 12/16/2021 Recommended yearly ophthalmology/optometry visit for glaucoma screening and checkup Recommended yearly dental visit for hygiene and checkup  Vaccinations: Influenza vaccine: decline Pneumococcal vaccine: completed 11/07/2019 Tdap vaccine: completed 03/24/2020, due 03/24/2030 Shingles vaccine: decline   Covid-19: 06/25/2020  Advanced directives: Please bring a copy of your POA (Power of [place]) and/or Living Will to your next appointment.   Conditions/risks identified:  none  Next appointment: Follow up in one year for your annual wellness visit    Preventive Care 65 Years and Older, Female Preventive care refers to lifestyle choices and visits with your health care provider that can promote health and wellness. What does preventive care include? A yearly physical exam. This is also called an annual well check. Dental exams once or twice a year. Routine eye exams. Ask your health care provider how often you should have your eyes checked. Personal lifestyle choices, including: Daily care of your teeth and gums. Regular physical activity. Eating a healthy diet. Avoiding tobacco and drug use. Limiting alcohol use. Practicing safe sex. Taking low-dose aspirin every day. Taking vitamin and mineral supplements as recommended by your health care provider. What happens during an annual well check? The services and screenings done by your health care provider during your annual well check will depend on your age, overall health, lifestyle risk factors, and family history of disease. Counseling  Your health care  provider may ask you questions about your: Alcohol use. Tobacco use. Drug use. Emotional well-being. Home and relationship well-being. Sexual activity. Eating habits. History of falls. Memory and ability to understand (cognition). Work and work Statistician. Reproductive health. Screening  You may have the following tests or measurements: Height, weight, and BMI. Blood pressure. Lipid and cholesterol levels. These may be checked every 5 years, or more frequently if you are over 1 years old. Skin check. Lung cancer screening. You may have this screening every year starting at age 56 if you have a 30-pack-year history of smoking and currently smoke or have quit within the past 15 years. Fecal occult blood test (FOBT) of the stool. You may have this test every year starting at age 44. Flexible sigmoidoscopy or colonoscopy. You may have a sigmoidoscopy every 5 years or a colonoscopy every 10 years starting at age 54. Hepatitis C blood test. Hepatitis B blood test. Sexually transmitted disease (STD) testing. Diabetes screening. This is done by checking your blood sugar (glucose) after you have not eaten for a while (fasting). You may have this done every 1-3 years. Bone density scan. This is done to screen for osteoporosis. You may have this done starting at age 29. Mammogram. This may be done every 1-2 years. Talk to your health care provider about how often you should have regular mammograms. Talk with your health care provider about your test results, treatment options, and if necessary, the need for more tests. Vaccines  Your health care provider may recommend certain vaccines, such as: Influenza vaccine. This is recommended every year. Tetanus, diphtheria, and acellular pertussis (Tdap, Td) vaccine. You may need a Td booster every 10 years. Zoster vaccine. You may need this after age 32. Pneumococcal 13-valent conjugate (PCV13) vaccine. One dose  is recommended after age  75. Pneumococcal polysaccharide (PPSV23) vaccine. One dose is recommended after age 32. Talk to your health care provider about which screenings and vaccines you need and how often you need them. This information is not intended to replace advice given to you by your health care provider. Make sure you discuss any questions you have with your health care provider. Document Released: 12/12/2015 Document Revised: 08/04/2016 Document Reviewed: 09/16/2015 Elsevier Interactive Patient Education  2017 [employer] Prevention in the Home Falls can cause injuries. They can happen to people of all ages. There are many things you can do to make your home safe and to help prevent falls. What can I do on the outside of my home? Regularly fix the edges of walkways and driveways and fix any cracks. Remove anything that might make you trip as you walk through a door, such as a raised step or threshold. Trim any bushes or trees on the path to your home. Use bright outdoor lighting. Clear any walking paths of anything that might make someone trip, such as rocks or tools. Regularly check to see if handrails are loose or broken. Make sure that both sides of any steps have handrails. Any raised decks and porches should have guardrails on the edges. Have any leaves, snow, or ice cleared regularly. Use sand or salt on walking paths during winter. Clean up any spills in your garage right away. This includes oil or grease spills. What can I do in the bathroom? Use night lights. Install grab bars by the toilet and in the tub and shower. Do not use towel bars as grab bars. Use non-skid mats or decals in the tub or shower. If you need to sit down in the shower, use a plastic, non-slip stool. Keep the floor dry. Clean up any water that spills on the floor as soon as it happens. Remove soap buildup in the tub or shower regularly. Attach bath mats securely with double-sided non-slip rug tape. Do not have throw  rugs and other things on the floor that can make you trip. What can I do in the bedroom? Use night lights. Make sure that you have a light by your bed that is easy to reach. Do not use any sheets or blankets that are too big for your bed. They should not hang down onto the floor. Have a firm chair that has side arms. You can use this for support while you get dressed. Do not have throw rugs and other things on the floor that can make you trip. What can I do in the kitchen? Clean up any spills right away. Avoid walking on wet floors. Keep items that you use a lot in easy-to-reach places. If you need to reach something above you, use a strong step stool that has a grab bar. Keep electrical cords out of the way. Do not use floor polish or wax that makes floors slippery. If you must use wax, use non-skid floor wax. Do not have throw rugs and other things on the floor that can make you trip. What can I do with my stairs? Do not leave any items on the stairs. Make sure that there are handrails on both sides of the stairs and use them. Fix handrails that are broken or loose. Make sure that handrails are as long as the stairways. Check any carpeting to make sure that it is firmly attached to the stairs. Fix any carpet that is loose or worn. Avoid  having throw rugs at the top or bottom of the stairs. If you do have throw rugs, attach them to the floor with carpet tape. Make sure that you have a light switch at the top of the stairs and the bottom of the stairs. If you do not have them, ask someone to add them for you. What else can I do to help prevent falls? Wear shoes that: Do not have high heels. Have rubber bottoms. Are comfortable and fit you well. Are closed at the toe. Do not wear sandals. If you use a stepladder: Make sure that it is fully opened. Do not climb a closed stepladder. Make sure that both sides of the stepladder are locked into place. Ask someone to hold it for you, if  possible. Clearly mark and make sure that you can see: Any grab bars or handrails. First and last steps. Where the edge of each step is. Use tools that help you move around (mobility aids) if they are needed. These include: Canes. Walkers. Scooters. Crutches. Turn on the lights when you go into a dark area. Replace any light bulbs as soon as they burn out. Set up your furniture so you have a clear path. Avoid moving your furniture around. If any of your floors are uneven, fix them. If there are any pets around you, be aware of where they are. Review your medicines with your doctor. Some medicines can make you feel dizzy. This can increase your chance of falling. Ask your doctor what other things that you can do to help prevent falls. This information is not intended to replace advice given to you by your health care provider. Make sure you discuss any questions you have with your health care provider. Document Released: 09/11/2009 Document Revised: 04/22/2016 Document Reviewed: 12/20/2014 Elsevier Interactive Patient Education  2017 [employer].

## 2022-02-02 NOTE — Progress Notes (Signed)
This visit occurred during the SARS-CoV-2 public health emergency.  Safety protocols were in place, including screening questions prior to the visit, additional usage of staff PPE, and extensive cleaning of exam room while observing appropriate contact time as indicated for disinfecting solutions.  Subjective:   Erika Brown is a 81 y.o. female who presents for Medicare Annual (Subsequent) preventive examination.  Review of Systems     Cardiac Risk Factors include: advanced age (>69mn, >>77women);dyslipidemia;hypertension     Objective:    Today's Vitals   02/02/22 0912  BP: 110/64  Pulse: 80  Temp: 98.2 F (36.8 C)  TempSrc: Oral  SpO2: 96%  Weight: 145 lb (65.8 kg)  Height: '5\' 4"'$  (1.626 m)   Body mass index is 24.89 kg/m.  Advanced Directives 02/02/2022 05/19/2021 12/11/2020 12/21/2019 12/20/2019 03/30/2018 03/30/2018  Does Patient Have a Medical Advance Directive? Yes Yes Yes Yes Yes Yes Yes  Type of AParamed[place]of [place] will [place]Living will [place] will Out of facility DNR (pink MOST or yellow form) Out of facility DNR (pink MOST or yellow form) HPort EwenLiving will H[place]Living will  Does patient want to make changes to medical advance directive? - No - Patient declined No - Patient declined No - Patient declined No - Patient declined - -  Copy of [place] Chart? No - copy requested No - copy requested No - copy requested - - - -  Would patient like information on creating a medical advance directive? - - - - No - Patient declined - -    Current Medications (verified) Outpatient Encounter Medications as of 02/02/2022  Medication Sig   amLODipine (NORVASC) 5 MG tablet TAKE 1 TABLET ONCE DAILY.   Ascorbic Acid (VITAMIN C) 1000 MG tablet Take 1,000 mg by mouth daily.   aspirin 81 MG EC tablet Take 81 mg by mouth daily.   Biotin  10000 MCG TABS Take 5,000 mcg by mouth daily.   Cholecalciferol (VITAMIN D3) 125 MCG (5000 UT) CAPS Take 5,000 Units by mouth daily.   clopidogrel (PLAVIX) 75 MG tablet TAKE 1 TABLET ONCE DAILY. (Patient taking differently: Take 75 mg by mouth daily.)   ezetimibe (ZETIA) 10 MG tablet Take 0.5 tablets (5 mg total) by mouth daily.   irbesartan-hydrochlorothiazide (AVALIDE) 300-12.5 MG tablet Take 1 tablet by mouth daily.   metoprolol succinate (TOPROL-XL) 50 MG 24 hr tablet TAKE ONE TABLET BY MOUTH DAILY   nitroGLYCERIN (NITROSTAT) 0.4 MG SL tablet Place 1 tablet (0.4 mg total) under the tongue every 5 (five) minutes as needed for chest pain.   potassium chloride SA (KLOR-CON) 20 MEQ tablet TAKE 1 TABLET ONCE DAILY. (Patient taking differently: Take 20 mEq by mouth daily.)   zinc gluconate 50 MG tablet Take 50 mg by mouth daily.   Polyethyl Glycol-Propyl Glycol (SYSTANE OP) Place 1 drop into both eyes in the morning and at bedtime. (Patient not taking: Reported on 11/03/2021)   No facility-administered encounter medications on file as of 02/02/2022.    Allergies (verified) Iodine, Latex, Other, and Statins   History: Past Medical History:  Diagnosis Date   Anginal pain ([place]ake [place]    Anxiety and depression    Breast cancer ([place]    Breast cancer ([place]    CAD (coronary artery disease)    S/P NSTEMI 4/06 EF 60%; Treated with Cypher DES to LAD and RCA with kissing balloon PCI diagonal; Treadmill  myoview 10/09 for CP: Walked 7:06. mild ST-T-wave changes in recovery. EF 71%. Very mild reversible defect in base of the inferior wall --> medical rx.   Cancer ([place])    HISTORY OF SKIN CANCER   Cataract    Complication of anesthesia    " DIFFICULT TO WAKE UP "   Depression    Family history of breast cancer 04/01/2021   Family history of colon cancer 04/01/2021   Family history of malignant melanoma 04/01/2021   Glucose intolerance (impaired glucose tolerance)    History of echocardiogram    Echo  11/17: Vigorous LVF, EF 65-70, normal wall motion, borderline diastolic function, systolic bowing of mitral valve without prolapse, mild MR, trivial TR, PASP 20   History of medication noncompliance    Hyperlipidemia    Intolerate to multiple statins due to severe myalgias   Hypertension    Possible white-coat component   MI (myocardial infarction) ([place])    acute-s/p stent   Microhematuria    Pneumonia due to COVID-19 virus    Psoriasis    Rapid heart beat    Vitamin D deficiency    Past Surgical History:  Procedure Laterality Date   ABDOMINAL HYSTERECTOMY     and anterior repair   APPENDECTOMY     bladder tact     BREAST BIOPSY     BREAST LUMPECTOMY WITH RADIOACTIVE SEED LOCALIZATION Left 05/21/2021   Procedure: LEFT BREAST LUMPECTOMY WITH RADIOACTIVE SEED LOCALIZATION;  Surgeon: Rolm Bookbinder, MD;  Location: [facility];  Service: General;  Laterality: Left;   CARDIAC CATHETERIZATION  02/28/2018   CATARACT EXTRACTION Bilateral 2017   Dr. Katy Fitch   CORONARY STENT INTERVENTION N/A 02/28/2018   Procedure: CORONARY STENT INTERVENTION;  Surgeon: Jettie Booze, MD;  Location: [facility];  Service: Cardiovascular;  Laterality: N/A;   CORONARY STENT PLACEMENT     CORONARY STENT PLACEMENT  04/02/019   EYE SURGERY Bilateral    cataract   HEMORRHOID SURGERY     LEFT HEART CATH AND CORONARY ANGIOGRAPHY N/A 02/28/2018   Procedure: LEFT HEART CATH AND CORONARY ANGIOGRAPHY;  Surgeon: Jettie Booze, MD;  Location: [facility];  Service: Cardiovascular;  Laterality: N/A;   MOHS SURGERY  07/2013   BCC.  [facility]   RECTAL PROLAPSE REPAIR  1976   TONSILLECTOMY AND ADENOIDECTOMY     ULTRASOUND GUIDANCE FOR VASCULAR ACCESS  02/28/2018   Procedure: Ultrasound Guidance For Vascular Access;  Surgeon: Jettie Booze, MD;  Location: [facility];  Service: Cardiovascular;;   Family History  Problem Relation Age of Onset   Heart disease Father    Heart attack  Father    Ulcers Father    Hypertension Mother    Melanoma Mother        mets? bottom of foot; dx 48s   Melanoma Daughter        metastatic; left arm; d. 48   Stroke Maternal Grandmother    Hypertension Maternal Grandmother    Stroke Maternal Grandfather    Renal Disease Maternal Grandfather        kidney removed   Hypertension Maternal Grandfather    Diabetes Paternal Grandmother    Breast cancer Sister 51       mets   Lung cancer Sister        dx 48s   Hypertension Brother    Asthma Brother    Depression Daughter        x2   Breast  cancer Daughter 40   Colon cancer Son 95   Coronary artery disease Neg Hx    Social History   Socioeconomic History   Marital status: Widowed    Spouse name: Not on file   Number of children: 7   Years of education: Not on file   Highest education level: Not on file  Occupational History   Occupation: Works at home, Charity fundraiser, 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  Tobacco Use   Smoking status: Never   Smokeless tobacco: Never   Tobacco comments:    only a few times  Vaping Use   Vaping Use: Never used  Substance and Sexual Activity   Alcohol use: Yes    Alcohol/week: 4.0 standard drinks    Types: 4 Glasses of wine per week   Drug use: No   Sexual activity: Not Currently    Birth control/protection: Abstinence, Surgical    Comment: TV[facility]  Other Topics Concern   Not on file  Social History Narrative   Lives in [place] with her husband   Seven kids   Plays tennis   Very good card player   Social Determinants of Health   Financial Resource Strain: Low Risk    Difficulty of Paying Living Expenses: Not hard at all  Food Insecurity: No Food Insecurity   Worried About Charity fundraiser in the Last Year: Never true   Arboriculturist in the Last Year: Never true  Transportation Needs: No Transportation Needs   Lack of Transportation (Medical): No   Lack of Transportation (Non-Medical): No  Physical Activity: Inactive   Days of Exercise per  Week: 0 days   Minutes of Exercise per Session: 0 min  Stress: No Stress Concern Present   Feeling of Stress : Only a little  Social Connections: Moderately Integrated   Frequency of Communication with Friends and Family: More than three times a week   Frequency of Social Gatherings with Friends and Family: More than three times a week   Attends Religious Services: More than 4 times per year   Active Member of [employer] or Organizations: Yes   Attends Archivist Meetings: More than 4 times per year   Marital Status: Widowed    Tobacco Counseling Counseling given: Not Answered Tobacco comments: only a few times   Clinical Intake:  Pre-visit preparation completed: Yes  Pain : No/denies pain     Nutritional Status: BMI of 19-24  Normal Nutritional Risks: None Diabetes: No  How often do you need to have someone help you when you read instructions, pamphlets, or other written materials from your doctor or pharmacy?: 1 - Never What is the last grade level you completed in school?: associate degree  Diabetic? no  Interpreter Needed?: No  Information entered by :: NAllen LPN   Activities of Daily Living In your present state of health, do you have any difficulty performing the following activities: 02/02/2022 05/19/2021  Hearing? N Y  Vision? N N  Difficulty concentrating or making decisions? N N  Walking or climbing stairs? N N  Dressing or bathing? N N  Doing errands, shopping? N N  Preparing Food and eating ? N -  Using the Toilet? N -  In the past six months, have you accidently leaked urine? N -  Do you have problems with loss of bowel control? N -  Managing your Medications? N -  Managing your Finances? N -  Housekeeping or managing your Housekeeping? N -  Some recent data might  be hidden    Patient Care Team: [place], Betty G, MD as PCP - General (Family Medicine) Jettie Booze, MD as PCP - Cardiology (Cardiology) Rolm Bookbinder, MD as  Consulting Physician (General Surgery) Nicholas Lose, MD as Consulting Physician (Hematology and Oncology) Kyung Rudd, MD as Consulting Physician (Radiation Oncology) Delice Bison Charlestine Massed, NP as Nurse Practitioner (Hematology and Oncology) Harmon Pier, RN as Registered Nurse  Indicate any recent [place] you may have received from other than [facility] providers in the past year (date may be approximate).     Assessment:   This is a routine wellness examination for Erika Brown.  Hearing/Vision screen No results found.  Dietary issues and exercise activities discussed: Current Exercise Habits: The patient does not participate in regular exercise at present   Goals Addressed             This Visit's Progress    Patient Stated       02/02/2022, maintain health, drink enough water, eat a healthy diet       Depression Screen PHQ 2/9 Scores 02/02/2022 09/17/2021 12/11/2020 09/09/2020 08/17/2018 06/12/2015  PHQ - 2 Score 0 0 0 0 0 0    Fall Risk Fall Risk  02/02/2022 12/11/2020 08/17/2018  Falls in the past year? 0 0 No  Number falls in past yr: - 0 -  Injury with Fall? - 0 -  Risk for fall due to : Medication side effect No Fall Risks -  Follow up Falls evaluation completed;Education provided;Falls prevention discussed Falls evaluation completed;Falls prevention discussed -    FALL RISK PREVENTION PERTAINING TO THE HOME:  Any stairs in or around the home? Yes  If so, are there any without handrails? No  Home free of loose throw rugs in walkways, pet beds, electrical cords, etc? Yes  Adequate lighting in your home to reduce risk of falls? Yes   ASSISTIVE DEVICES UTILIZED TO PREVENT FALLS:  Life alert? No  Use of a cane, walker or w/c? No  Grab bars in the bathroom? Yes  Shower chair or bench in shower? No  Elevated toilet seat or a handicapped toilet? No   TIMED UP AND GO:  Was the test performed? No .    Gait steady and fast without use of assistive  device  Cognitive Function:     6CIT Screen 02/02/2022  What Year? 0 points  What month? 0 points  What time? 0 points  Count back from 20 0 points  Months in reverse 2 points  Repeat phrase 0 points  Total Score 2    Immunizations Immunization History  Administered Date(s) Administered   PFIZER(Purple Top)SARS-COV-2 Vaccination 06/25/2020   Pneumococcal Conjugate-13 11/10/2015   Pneumococcal Polysaccharide-23 07/18/2006, 11/07/2019   Tdap 03/23/2010, 03/24/2020    TDAP status: Up to date  Flu Vaccine status: Declined, Education has been provided regarding the importance of this vaccine but patient still declined. Advised may receive this vaccine at local pharmacy or Health Dept. Aware to provide a copy of the vaccination record if obtained from local pharmacy or Health Dept. Verbalized acceptance and understanding.  Pneumococcal vaccine status: Up to date  Covid-19 vaccine status: Declined, Education has been provided regarding the importance of this vaccine but patient still declined. Advised may receive this vaccine at local pharmacy or Health Dept.or vaccine clinic. Aware to provide a copy of the vaccination record if obtained from local pharmacy or Health Dept. Verbalized acceptance and understanding.  Qualifies for Shingles Vaccine? Yes   Zostavax  completed No   Shingrix Completed?: No.    Education has been provided regarding the importance of this vaccine. Patient has been advised to call insurance company to determine out of pocket expense if they have not yet received this vaccine. Advised may also receive vaccine at local pharmacy or Health Dept. Verbalized acceptance and understanding.  Screening Tests Health Maintenance  Topic Date Due   Zoster Vaccines- Shingrix (1 of 2) Never done   COVID-19 Vaccine (2 - Pfizer risk series) 07/16/2020   INFLUENZA VACCINE  02/26/2022 (Originally 06/29/2021)   TETANUS/TDAP  03/24/2030   Pneumonia Vaccine 82+ Years old  Completed    HPV VACCINES  Aged Out   DEXA SCAN  Discontinued    Health Maintenance  Health Maintenance Due  Topic Date Due   Zoster Vaccines- Shingrix (1 of 2) Never done   COVID-19 Vaccine (2 - Pfizer risk series) 07/16/2020    Colorectal cancer screening: No longer required.   Mammogram status: Completed 03/20/2021. Repeat every year  Bone Density status: Completed 12/16/2021.   Lung Cancer Screening: (Low Dose CT Chest recommended if Age 86-80 years, 30 pack-year currently smoking OR have quit w/in 15years.) does not qualify.   Lung Cancer Screening Referral: no  Additional Screening:  Hepatitis C Screening: does not qualify;   Vision Screening: Recommended annual ophthalmology exams for early detection of glaucoma and other disorders of the eye. Is the patient up to date with their annual eye exam?  No  Who is the provider or what is the name of the office in which the patient attends annual eye exams? [facility] If pt is not established with a provider, would they like to be referred to a provider to establish care? No .   Dental Screening: Recommended annual dental exams for proper oral hygiene  Community Resource Referral / Chronic Care Management: CRR required this visit?  No   CCM required this visit?  No      Plan:     I have personally reviewed and noted the following in the patients chart:   Medical and social history Use of alcohol, tobacco or illicit drugs  Current medications and supplements including opioid prescriptions.  Functional ability and status Nutritional status Physical activity Advanced directives List of other physicians Hospitalizations, surgeries, and ER visits in previous 12 months Vitals Screenings to include cognitive, depression, and falls Referrals and appointments  In addition, I have reviewed and discussed with patient certain preventive protocols, quality metrics, and best practice recommendations. A written personalized  care plan for preventive services as well as general preventive health recommendations were provided to patient.     Kellie Simmering, LPN   0/12/5425   Nurse Notes: none

## 2022-02-08 ENCOUNTER — Telehealth: Payer: Self-pay | Admitting: Interventional Cardiology

## 2022-02-08 DIAGNOSIS — I2510 Atherosclerotic heart disease of native coronary artery without angina pectoris: Secondary | ICD-10-CM

## 2022-02-08 MED ORDER — AMLODIPINE BESYLATE 5 MG PO TABS: 5.0000 mg | ORAL_TABLET | Freq: Every day | ORAL | 2 refills | Status: DC

## 2022-02-08 MED ORDER — NITROGLYCERIN 0.4 MG SL SUBL: 0.4000 mg | SUBLINGUAL_TABLET | SUBLINGUAL | 1 refills | Status: AC | PRN

## 2022-02-08 MED ORDER — IRBESARTAN-HYDROCHLOROTHIAZIDE 300-12.5 MG PO TABS: 1.0000 | ORAL_TABLET | Freq: Every day | ORAL | 2 refills | Status: DC

## 2022-02-08 MED ORDER — CLOPIDOGREL BISULFATE 75 MG PO TABS: 75.0000 mg | ORAL_TABLET | Freq: Every day | ORAL | 2 refills | Status: DC

## 2022-02-08 MED ORDER — METOPROLOL SUCCINATE ER 50 MG PO TB24: 50.0000 mg | ORAL_TABLET | Freq: Every day | ORAL | 2 refills | Status: DC

## 2022-02-08 MED ORDER — EZETIMIBE 10 MG PO TABS: 5.0000 mg | ORAL_TABLET | Freq: Every day | ORAL | 2 refills | Status: DC

## 2022-02-08 MED ORDER — POTASSIUM CHLORIDE CRYS ER 20 MEQ PO TBCR: 20.0000 meq | EXTENDED_RELEASE_TABLET | Freq: Every day | ORAL | 2 refills | Status: DC

## 2022-02-08 NOTE — Telephone Encounter (Signed)
?*  STAT* If patient is at the pharmacy, call can be transferred to refill team. ? ? ?1. Which medications need to be refilled? (please list name of each medication and dose if known)  ?metoprolol succinate (TOPROL-XL) 50 MG 24 hr tablet ?ezetimibe (ZETIA) 10 MG tablet ?clopidogrel (PLAVIX) 75 MG tablet ?amLODipine (NORVASC) 5 MG tablet ?potassium chloride SA (KLOR-CON) 20 MEQ tablet ?irbesartan-hydrochlorothiazide (AVALIDE) 300-12.5 MG tablet ? ?2. Which pharmacy/location (including street and city if local pharmacy) is medication to be sent to? ?[place], [place] ? ?3. Do they need a 30 day or 90 day supply? 90 with refills ? ? ?Patient has never done mail order pharmacy before. She would like to have all of her meds sent there so she would not have to pay a copay. She would like to get  ? ? ?

## 2022-02-08 NOTE — Telephone Encounter (Signed)
Pt's medications were sent to pt's pharmacy as requested. Confirmation received.  

## 2022-02-22 ENCOUNTER — Encounter: Payer: Self-pay | Admitting: Family Medicine

## 2022-02-22 ENCOUNTER — Ambulatory Visit (INDEPENDENT_AMBULATORY_CARE_PROVIDER_SITE_OTHER): Payer: Medicare HMO | Admitting: Family Medicine

## 2022-02-22 VITALS — BP 120/78 | HR 78 | Temp 98.3°F | Resp 16 | Ht 64.0 in | Wt 143.0 lb

## 2022-02-22 DIAGNOSIS — C50412 Malignant neoplasm of upper-outer quadrant of left female breast: Secondary | ICD-10-CM

## 2022-02-22 DIAGNOSIS — H6123 Impacted cerumen, bilateral: Secondary | ICD-10-CM

## 2022-02-22 DIAGNOSIS — Z170 Estrogen receptor positive status [ER+]: Secondary | ICD-10-CM

## 2022-02-22 DIAGNOSIS — E785 Hyperlipidemia, unspecified: Secondary | ICD-10-CM

## 2022-02-22 NOTE — Progress Notes (Signed)
? ? ?ACUTE VISIT ?Chief Complaint  ?Patient presents with  ? Ear Fullness  ? ?HPI: ?[name] is a 81 y.o. female, who is here today complaining of bilateral ear fullness sensation, L>R noted about 2 weeks ago. ?No recent URI or travel. ? ?Ear Fullness  ?There is pain in both ears. This is a new problem. The current episode started 1 to 4 weeks ago. The problem occurs constantly. The problem has been unchanged. There has been no fever. The patient is experiencing no pain. Associated symptoms include rhinorrhea. Pertinent negatives include no abdominal pain, coughing, diarrhea, ear discharge, headaches, neck pain, rash, sore throat or vomiting. The treatment provided no relief. There is no history of a tympanostomy tube.  ?Debrox in ears did not help. ? ?For months she has had rhinorrhea and nasal congestion. ? ?Since her last visit she has seen her cardiologist and oncologist. ?Malignant neoplasia of upper outer quadrant left breast.  ?Breast Bx showed invasive and in situ ductal carcinoma grade 1. ?S/P left lumpectomy. ?She is not aromatase inhibitor. [name] ?Reports that recent DEXA showed osteopenia. ?Has an appt with Dr Erika Brown next month. ? ?HLD: She states that new medication was recommended,PCSK9, but she does not want to take injections and does not want another medication. ?She has not tolerated statins in the past. ?She is on Zatia 10 mg 1/2 tab daily, she wonders if she can try increasing it to 10 mg. ?She follows low fat diet. ? ?Lab Results  ?Component Value Date  ? CHOL 200 (H) 11/03/2021  ? HDL 79 11/03/2021  ? LDLCALC 109 (H) 11/03/2021  ? LDLDIRECT 121.7 09/08/2007  ? TRIG 66 11/03/2021  ? CHOLHDL 2.5 11/03/2021  ? ?Review of Systems  ?Constitutional:  Negative for activity change, appetite change, chills and fever.  ?HENT:  Positive for rhinorrhea. Negative for ear discharge, ear pain, mouth sores and sore throat.   ?Respiratory:  Negative for cough and shortness of breath.   ?Gastrointestinal:   Negative for abdominal pain, diarrhea and vomiting.  ?Musculoskeletal:  Negative for neck pain.  ?Skin:  Negative for rash.  ?Neurological:  Negative for headaches.  ?Rest see pertinent positives and negatives per HPI. ? ?Current Outpatient Medications on File Prior to Visit  ?Medication Sig Dispense Refill  ? amLODipine (NORVASC) 5 MG tablet Take 1 tablet (5 mg total) by mouth daily. 90 tablet 2  ? Ascorbic Acid (VITAMIN C) 1000 MG tablet Take 1,000 mg by mouth daily.    ? aspirin 81 MG EC tablet Take 81 mg by mouth daily.    ? Biotin 10000 MCG TABS Take 5,000 mcg by mouth daily.    ? Cholecalciferol (VITAMIN D3) 125 MCG (5000 UT) CAPS Take 5,000 Units by mouth daily.    ? clopidogrel (PLAVIX) 75 MG tablet Take 1 tablet (75 mg total) by mouth daily. 90 tablet 2  ? ezetimibe (ZETIA) 10 MG tablet Take 0.5 tablets (5 mg total) by mouth daily. 45 tablet 2  ? irbesartan-hydrochlorothiazide (AVALIDE) 300-12.5 MG tablet Take 1 tablet by mouth daily. 90 tablet 2  ? metoprolol succinate (TOPROL-XL) 50 MG 24 hr tablet Take 1 tablet (50 mg total) by mouth daily. Take with or immediately following a meal. 90 tablet 2  ? nitroGLYCERIN (NITROSTAT) 0.4 MG SL tablet Place 1 tablet (0.4 mg total) under the tongue every 5 (five) minutes as needed for chest pain. 75 tablet 1  ? potassium chloride SA (KLOR-CON M) 20 MEQ tablet Take 1 tablet (20  mEq total) by mouth daily. 90 tablet 2  ? zinc gluconate 50 MG tablet Take 50 mg by mouth daily.    ? ?No current facility-administered medications on file prior to visit.  ? ?Past Medical History:  ?Diagnosis Date  ? Anginal pain ([place])   ? Anxiety and depression   ? Breast cancer ([place])   ? Breast cancer ([place])   ? CAD (coronary artery disease)   ? S/P NSTEMI 4/06 EF 60%; Treated with Cypher DES to LAD and RCA with kissing balloon PCI diagonal; Treadmill myoview 10/09 for CP: Walked 7:06. mild ST-T-wave changes in recovery. EF 71%. Very mild reversible defect in base of the inferior wall -->  medical rx.  ? Cancer ([place])   ? HISTORY OF SKIN CANCER  ? Cataract   ? Complication of anesthesia   ? " DIFFICULT TO WAKE UP "  ? Depression   ? Family history of breast cancer 04/01/2021  ? Family history of colon cancer 04/01/2021  ? Family history of malignant melanoma 04/01/2021  ? Glucose intolerance (impaired glucose tolerance)   ? History of echocardiogram   ? Echo 11/17: Vigorous LVF, EF 65-70, normal wall motion, borderline diastolic function, systolic bowing of mitral valve without prolapse, mild MR, trivial TR, PASP 20  ? History of medication noncompliance   ? Hyperlipidemia   ? Intolerate to multiple statins due to severe myalgias  ? Hypertension   ? Possible white-coat component  ? MI (myocardial infarction) ([place])   ? acute-s/p stent  ? Microhematuria   ? Pneumonia due to COVID-19 virus   ? Psoriasis   ? Rapid heart beat   ? Vitamin D deficiency   ? ?Allergies  ?Allergen Reactions  ? Iodine Shortness Of Breath and Palpitations  ?  Has had some SHOB and rapid heart rate in the past  ? Latex Other (See Comments)  ?  discomfort  ? Other Other (See Comments)  ?  Some foods b/c of preservatives   ? Statins Other (See Comments)  ?  Intolerance to high dosages, myalgia  ? ? ?Social History  ? ?Socioeconomic History  ? Marital status: Widowed  ?  Spouse name: Not on file  ? Number of children: 7  ? Years of education: Not on file  ? Highest education level: Not on file  ?Occupational History  ? Occupation: Works at home, Charity fundraiser, [employer]  ?Tobacco Use  ? Smoking status: Never  ? Smokeless tobacco: Never  ? Tobacco comments:  ?  only a few times  ?Vaping Use  ? Vaping Use: Never used  ?Substance and Sexual Activity  ? Alcohol use: Yes  ?  Alcohol/week: 4.0 standard drinks  ?  Types: 4 Glasses of wine per week  ? Drug use: No  ? Sexual activity: Not Currently  ?  Birth control/protection: Abstinence, Surgical  ?  Comment: TVH  ?Other Topics Concern  ? Not on file  ?Social History Narrative  ? Lives  in [place] with her husband  ? Seven kids  ? Plays tennis  ? Very good card player  ? ?Social Determinants of Health  ? ?Financial Resource Strain: Low Risk   ? Difficulty of Paying Living Expenses: Not hard at all  ?Food Insecurity: No Food Insecurity  ? Worried About Charity fundraiser in the Last Year: Never true  ? Ran Out of Food in the Last Year: Never true  ?Transportation Needs: No Transportation Needs  ? Lack of Transportation (Medical): No  ?  Lack of Transportation (Non-Medical): No  ?Physical Activity: Inactive  ? Days of Exercise per Week: 0 days  ? Minutes of Exercise per Session: 0 min  ?Stress: No Stress Concern Present  ? Feeling of Stress : Only a little  ?Social Connections: Moderately Integrated  ? Frequency of Communication with Friends and Family: More than three times a week  ? Frequency of Social Gatherings with Friends and Family: More than three times a week  ? Attends Religious Services: More than 4 times per year  ? Active Member of Clubs or Organizations: Yes  ? Attends Archivist Meetings: More than 4 times per year  ? Marital Status: Widowed  ? ? ?Vitals:  ? 02/22/22 0930  ?BP: 120/78  ?Pulse: 78  ?Resp: 16  ?Temp: 98.3 ?F (36.8 ?C)  ?SpO2: 98%  ? ?Body mass index is 24.55 kg/m?. ? ?Physical Exam ?Vitals and nursing note reviewed.  ?Constitutional:   ?   General: She is not in acute distress. ?   Appearance: She is well-developed and normal weight. She is not ill-appearing.  ?HENT:  ?   Head: Normocephalic and atraumatic.  ?   Right Ear: External ear normal.  ?   Left Ear: External ear normal.  ?   Ears:  ?   Comments: Bilateral cerumen impaction. ?   Nose: Septal deviation present. No congestion or rhinorrhea.  ?   Right Turbinates: Not enlarged.  ?   Left Turbinates: Enlarged.  ?   Mouth/Throat:  ?   Mouth: Mucous membranes are moist.  ?Eyes:  ?   Conjunctiva/sclera: Conjunctivae normal.  ?Cardiovascular:  ?   Rate and Rhythm: Normal rate and regular rhythm.  ?   Heart  sounds: No murmur heard. ?Pulmonary:  ?   Effort: Pulmonary effort is normal. No respiratory distress.  ?   Breath sounds: Normal breath sounds. No stridor.  ?Lymphadenopathy:  ?   Cervical: No cervical

## 2022-02-22 NOTE — Patient Instructions (Addendum)
A few things to remember from today's visit: ? ?Hearing loss of both ears due to cerumen impaction ? ?If you need refills please call your pharmacy. ?Do not use My Chart to request refills or for acute issues that need immediate attention. ?  ?Please be sure medication list is accurate. ?If a new problem present, please set up appointment sooner than planned today. ? ?Earwax Buildup, Adult ?The ears produce a substance called earwax that helps keep bacteria out of the ear and protects the skin in the ear canal. Occasionally, earwax can build up in the ear and cause discomfort or hearing loss. ?What are the causes? ?This condition is caused by a buildup of earwax. Ear canals are self-cleaning. Ear wax is made in the outer part of the ear canal and generally falls out in small amounts over time. ?When the self-cleaning mechanism is not working, earwax builds up and can cause decreased hearing and discomfort. Attempting to clean ears with cotton swabs can push the earwax deep into the ear canal and cause decreased hearing and pain. ?What increases the risk? ?This condition is more likely to develop in people who: ?Clean their ears often with cotton swabs. ?Pick at their ears. ?Use earplugs or in-ear headphones often, or wear hearing aids. ?The following factors may also make you more likely to develop this condition: ?Being female. ?Being of older age. ?Naturally producing more earwax. ?Having narrow ear canals. ?Having earwax that is overly thick or sticky. ?Having excess hair in the ear canal. ?Having eczema. ?Being dehydrated. ?What are the signs or symptoms? ?Symptoms of this condition include: ?Reduced or muffled hearing. ?A feeling of fullness in the ear or feeling that the ear is plugged. ?Fluid coming from the ear. ?Ear pain or an itchy ear. ?Ringing in the ear. ?Coughing. ?Balance problems. ?An obvious piece of earwax that can be seen inside the ear canal. ?How is this diagnosed? ?This condition may be diagnosed  based on: ?Your symptoms. ?Your medical history. ?An ear exam. During the exam, your health care provider will look into your ear with an instrument called an otoscope. ?You may have tests, including a hearing test. ?How is this treated? ?This condition may be treated by: ?Using ear drops to soften the earwax. ?Having the earwax removed by a health care provider. The health care provider may: ?Flush the ear with water. ?Use an instrument that has a loop on the end (curette). ?Use a suction device. ?Having surgery to remove the wax buildup. This may be done in severe cases. ?Follow these instructions at home: ? ?Take over-the-counter and prescription medicines only as told by your health care provider. ?Do not put any objects, including cotton swabs, into your ear. You can clean the opening of your ear canal with a washcloth or facial tissue. ?Follow instructions from your health care provider about cleaning your ears. Do not overclean your ears. ?Drink enough fluid to keep your urine pale yellow. This will help to thin the earwax. ?Keep all follow-up visits as told. If earwax builds up in your ears often or if you use hearing aids, consider seeing your health care provider for routine, preventive ear cleanings. Ask your health care provider how often you should schedule your cleanings. ?If you have hearing aids, clean them according to instructions from the manufacturer and your health care provider. ?Contact a health care provider if: ?You have ear pain. ?You develop a fever. ?You have pus or other fluid coming from your ear. ?You have hearing  loss. ?You have ringing in your ears that does not go away. ?You feel like the room is spinning (vertigo). ?Your symptoms do not improve with treatment. ?Get help right away if: ?You have bleeding from the affected ear. ?You have severe ear pain. ?Summary ?Earwax can build up in the ear and cause discomfort or hearing loss. ?The most common symptoms of this condition include  reduced or muffled hearing, a feeling of fullness in the ear, or feeling that the ear is plugged. ?This condition may be diagnosed based on your symptoms, your medical history, and an ear exam. ?This condition may be treated by using ear drops to soften the earwax or by having the earwax removed by a health care provider. ?Do not put any objects, including cotton swabs, into your ear. You can clean the opening of your ear canal with a washcloth or facial tissue. ?This information is not intended to replace advice given to you by your health care provider. Make sure you discuss any questions you have with your health care provider. ?Document Revised: 03/04/2020 Document Reviewed: 03/04/2020 ?Elsevier Patient Education ? 2022 [place]. ? ? ? ? ? ? ?

## 2022-02-26 ENCOUNTER — Ambulatory Visit (INDEPENDENT_AMBULATORY_CARE_PROVIDER_SITE_OTHER): Payer: Medicare HMO | Admitting: Family Medicine

## 2022-02-26 ENCOUNTER — Encounter: Payer: Self-pay | Admitting: Family Medicine

## 2022-02-26 VITALS — BP 120/70 | HR 51 | Resp 16 | Ht 64.0 in | Wt 143.0 lb

## 2022-02-26 DIAGNOSIS — H60502 Unspecified acute noninfective otitis externa, left ear: Secondary | ICD-10-CM | POA: Diagnosis not present

## 2022-02-26 DIAGNOSIS — R001 Bradycardia, unspecified: Secondary | ICD-10-CM

## 2022-02-26 MED ORDER — NEOMYCIN-POLYMYXIN-HC 3.5-10000-1 OT SOLN: 3.0000 [drp] | Freq: Four times a day (QID) | OTIC | 0 refills | Status: AC

## 2022-02-26 NOTE — Progress Notes (Signed)
? ?ACUTE VISIT ?Chief Complaint  ?Patient presents with  ? Follow-up  ?  Ear is starting to feel irritated and like it is stretching out.  ? ?HPI: ?[name] is a 81 y.o. female, who is here today complaining of "uncomfortable" feeling in left ear as described above. ?She was here in the office on 02/22/22, she underwent bilateral ear lavage, which helped with hearing loss. Hearing is still "good" but started with ear canal pruritis and discomfort but no earache. ?She has not noted drainage. ?+ Scaly. ?Mild pruritus in left ear. ?In the past she has had cortisporin otic drops for similar symptoms. ?Negative for fever,chills,sore throat,or cough. ? ?Noted bradycardia today. ?PVC's,sinus tach,CAD,and HTN. ?She is on Metoprolol succinate 50 mg daily. ?Also on Irbesartan-HCTZ 300-12.5 mg and Amlodipine. ?She has not noted CP,SOB,palpitations,orthopnea,PND,or edema. ?Follows with cardiologist. ? ?Review of Systems  ?Constitutional:  Negative for activity change, appetite change and fever.  ?HENT:  Negative for congestion, mouth sores, postnasal drip and rhinorrhea.   ?Gastrointestinal:  Negative for abdominal pain, nausea and vomiting.  ?Musculoskeletal:  Negative for gait problem and myalgias.  ?Neurological:  Negative for syncope, weakness and headaches.  ?Rest see pertinent positives and negatives per HPI. ? ?Current Outpatient Medications on File Prior to Visit  ?Medication Sig Dispense Refill  ? amLODipine (NORVASC) 5 MG tablet Take 1 tablet (5 mg total) by mouth daily. 90 tablet 2  ? Ascorbic Acid (VITAMIN C) 1000 MG tablet Take 1,000 mg by mouth daily.    ? aspirin 81 MG EC tablet Take 81 mg by mouth daily.    ? Biotin 10000 MCG TABS Take 5,000 mcg by mouth daily.    ? Cholecalciferol (VITAMIN D3) 125 MCG (5000 UT) CAPS Take 5,000 Units by mouth daily.    ? clopidogrel (PLAVIX) 75 MG tablet Take 1 tablet (75 mg total) by mouth daily. 90 tablet 2  ? ezetimibe (ZETIA) 10 MG tablet Take 0.5 tablets (5 mg  total) by mouth daily. 45 tablet 2  ? irbesartan-hydrochlorothiazide (AVALIDE) 300-12.5 MG tablet Take 1 tablet by mouth daily. 90 tablet 2  ? metoprolol succinate (TOPROL-XL) 50 MG 24 hr tablet Take 1 tablet (50 mg total) by mouth daily. Take with or immediately following a meal. 90 tablet 2  ? nitroGLYCERIN (NITROSTAT) 0.4 MG SL tablet Place 1 tablet (0.4 mg total) under the tongue every 5 (five) minutes as needed for chest pain. 75 tablet 1  ? potassium chloride SA (KLOR-CON M) 20 MEQ tablet Take 1 tablet (20 mEq total) by mouth daily. 90 tablet 2  ? zinc gluconate 50 MG tablet Take 50 mg by mouth daily.    ? ?No current facility-administered medications on file prior to visit.  ? ? ? ?Past Medical History:  ?Diagnosis Date  ? Anginal pain ([place])   ? Anxiety and depression   ? Breast cancer ([place])   ? Breast cancer ([place])   ? CAD (coronary artery disease)   ? S/P NSTEMI 4/06 EF 60%; Treated with Cypher DES to LAD and RCA with kissing balloon PCI diagonal; Treadmill myoview 10/09 for CP: Walked 7:06. mild ST-T-wave changes in recovery. EF 71%. Very mild reversible defect in base of the inferior wall --> medical rx.  ? Cancer ([place])   ? HISTORY OF SKIN CANCER  ? Cataract   ? Complication of anesthesia   ? " DIFFICULT TO WAKE UP "  ? Depression   ? Family history of breast cancer 04/01/2021  ? Family  history of colon cancer 04/01/2021  ? Family history of malignant melanoma 04/01/2021  ? Glucose intolerance (impaired glucose tolerance)   ? History of echocardiogram   ? Echo 11/17: Vigorous LVF, EF 65-70, normal wall motion, borderline diastolic function, systolic bowing of mitral valve without prolapse, mild MR, trivial TR, PASP 20  ? History of medication noncompliance   ? Hyperlipidemia   ? Intolerate to multiple statins due to severe myalgias  ? Hypertension   ? Possible white-coat component  ? MI (myocardial infarction) ([place])   ? acute-s/p stent  ? Microhematuria   ? Pneumonia due to COVID-19 virus   ? Psoriasis   ?  Rapid heart beat   ? Vitamin D deficiency   ? ?Allergies  ?Allergen Reactions  ? Iodine Shortness Of Breath and Palpitations  ?  Has had some SHOB and rapid heart rate in the past  ? Latex Other (See Comments)  ?  discomfort  ? Other Other (See Comments)  ?  Some foods b/c of preservatives   ? Statins Other (See Comments)  ?  Intolerance to high dosages, myalgia  ? ? ?Social History  ? ?Socioeconomic History  ? Marital status: Widowed  ?  Spouse name: Not on file  ? Number of children: 7  ? Years of education: Not on file  ? Highest education level: Not on file  ?Occupational History  ? Occupation: Works at home, Charity fundraiser, [employer]  ?Tobacco Use  ? Smoking status: Never  ? Smokeless tobacco: Never  ? Tobacco comments:  ?  only a few times  ?Vaping Use  ? Vaping Use: Never used  ?Substance and Sexual Activity  ? Alcohol use: Yes  ?  Alcohol/week: 4.0 standard drinks  ?  Types: 4 Glasses of wine per week  ? Drug use: No  ? Sexual activity: Not Currently  ?  Birth control/protection: Abstinence, Surgical  ?  Comment: TVH  ?Other Topics Concern  ? Not on file  ?Social History Narrative  ? Lives in [place] with her husband  ? Seven kids  ? Plays tennis  ? Very good card player  ? ?Social Determinants of Health  ? ?Financial Resource Strain: Low Risk   ? Difficulty of Paying Living Expenses: Not hard at all  ?Food Insecurity: No Food Insecurity  ? Worried About Charity fundraiser in the Last Year: Never true  ? Ran Out of Food in the Last Year: Never true  ?Transportation Needs: No Transportation Needs  ? Lack of Transportation (Medical): No  ? Lack of Transportation (Non-Medical): No  ?Physical Activity: Inactive  ? Days of Exercise per Week: 0 days  ? Minutes of Exercise per Session: 0 min  ?Stress: No Stress Concern Present  ? Feeling of Stress : Only a little  ?Social Connections: Moderately Integrated  ? Frequency of Communication with Friends and Family: More than three times a week  ? Frequency of  Social Gatherings with Friends and Family: More than three times a week  ? Attends Religious Services: More than 4 times per year  ? Active Member of Clubs or Organizations: Yes  ? Attends Archivist Meetings: More than 4 times per year  ? Marital Status: Widowed  ? ?Vitals:  ? 02/26/22 1033  ?BP: 120/70  ?Pulse: (!) 51  ?Resp: 16  ?SpO2: 98%  ? ?Body mass index is 24.55 kg/m?. ? ?Physical Exam ?Vitals and nursing note reviewed.  ?Constitutional:   ?   General: She is  not in acute distress. ?   Appearance: She is well-developed.  ?HENT:  ?   Head: Normocephalic and atraumatic.  ?   Right Ear: Tympanic membrane and external ear normal.  ?   Left Ear: Tympanic membrane and external ear normal.  ?   Ears:  ?   Comments: Scaly ear canals, left mildly erythematous and tenderness with otoscopic exam. ?   Mouth/Throat:  ?   Mouth: Mucous membranes are moist.  ?Eyes:  ?   Conjunctiva/sclera: Conjunctivae normal.  ?Cardiovascular:  ?   Rate and Rhythm: Normal rate. Rhythm irregular.  ?   Comments: HR 73/min ?Pulmonary:  ?   Effort: Pulmonary effort is normal. No respiratory distress.  ?   Breath sounds: Normal breath sounds.  ?Abdominal:  ?   Palpations: Abdomen is soft. There is no hepatomegaly or mass.  ?   Tenderness: There is no abdominal tenderness.  ?Lymphadenopathy:  ?   Cervical: No cervical adenopathy.  ?Skin: ?   General: Skin is warm.  ?   Findings: No erythema or rash.  ?Neurological:  ?   General: No focal deficit present.  ?   Mental Status: She is alert and oriented to person, place, and time.  ?   Cranial Nerves: No cranial nerve deficit.  ?   Gait: Gait normal.  ?Psychiatric:  ?   Comments: Well groomed, good eye contact.  ? ?ASSESSMENT AND PLAN: ? ?Ms.Shruthi was seen today for follow-up. ? ?Diagnoses and all orders for this visit: ? ?Acute otitis externa of left ear, unspecified type ?Mild. ?Topical abx treatment for 7 days in left ear. She can use medication daily as needed after completing  treatmetn for pruritus. Some side effects of topical steroid discussed. ?F/U as needed. ? ?-     neomycin-polymyxin-hydrocortisone (CORTISPORIN) OTIC solution; Place 3 drops into the left ear 4 (four) times da

## 2022-02-26 NOTE — Patient Instructions (Signed)
A few things to remember from today's visit: ? ?Acute otitis externa of left ear, unspecified type - Plan: neomycin-polymyxin-hydrocortisone (CORTISPORIN) OTIC solution ? ?If you need refills please call your pharmacy. ?Do not use My Chart to request refills or for acute issues that need immediate attention. ?  ?Check heart rate in a minute sometimes, today normal rate after re-checking it. ?Ear drops in left ear for 7 days then as needed. ? ?Please be sure medication list is accurate. ?If a new problem present, please set up appointment sooner than planned today. ? ? ? ? ? ? ? ?

## 2022-03-04 NOTE — Progress Notes (Incomplete)
? ?Patient Care Team: ?Martinique, Betty G, MD as PCP - General (Family Medicine) ?Jettie Booze, MD as PCP - Cardiology (Cardiology) ?Rolm Bookbinder, MD as Consulting Physician (General Surgery) ?Nicholas Lose, MD as Consulting Physician (Hematology and Oncology) ?Kyung Rudd, MD as Consulting Physician (Radiation Oncology) ?Gardenia Phlegm, NP as Nurse Practitioner (Hematology and Oncology) ?Harmon Pier, RN as Registered Nurse ? ?DIAGNOSIS: No diagnosis found. ? ?SUMMARY OF ONCOLOGIC HISTORY: ?Oncology History  ?Malignant neoplasm of upper-outer quadrant of left breast in female, estrogen receptor positive ([place])  ?03/30/2021 Initial Diagnosis  ? Patient palpated a left breast abnormality for 7 weeks. Diagnostic mammogram and US showed a 1.6cm mass at the 3 o'clock position in the left breast and no left axillary adenopathy. Biopsy showed invasive and in situ ductal carcinoma, grade 1, HER-2 equivocal by IHC, negative by FISH (ratio 1.45). ER/PR+ >95%, Ki67 1%. ?  ?04/01/2021 Cancer Staging  ? Staging form: Breast, AJCC 8th Edition ?- Clinical stage from 04/01/2021: Stage IA (cT1c, cN0, cM0, G1, ER+, PR+, HER2-) - Signed by Nicholas Lose, MD on 04/01/2021 ?Stage prefix: Initial diagnosis ?Histologic grading system: 3 grade system ? ?  ?04/13/2021 Genetic Testing  ? Negative hereditary cancer genetic testing: no pathogenic variants detected in [facility] CancerNext-Expanded +RNAinsight Panel.  Variant of uncertain significance detected in MSH6 at  p.T1100M (c.3299C>T).  The report date is Apr 13, 2021.   ? ?The CancerNext-Expanded gene panel offered by [facility] and includes sequencing, rearrangement, and RNA analysis for the following 77 genes: AIP, ALK, APC, ATM, AXIN2, BAP1, BARD1, BLM, BMPR1A, BRCA1, BRCA2, BRIP1, CDC73, CDH1, CDK4, CDKN1B, CDKN2A, CHEK2, CTNNA1, DICER1, FANCC, FH, FLCN, GALNT12, KIF1B, LZTR1, MAX, MEN1, MET, MLH1, MSH2, MSH3, MSH6, MUTYH, NBN, NF1, NF2, NTHL1, PALB2, PHOX2B, PMS2,  POT1, PRKAR1A, PTCH1, PTEN, RAD51C, RAD51D, RB1, RECQL, RET, SDHA, SDHAF2, SDHB, SDHC, SDHD, SMAD4, SMARCA4, SMARCB1, SMARCE1, STK11, SUFU, TMEM127, TP53, TSC1, TSC2, VHL and XRCC2 (sequencing and deletion/duplication); EGFR, EGLN1, HOXB13, KIT, MITF, PDGFRA, POLD1, and POLE (sequencing only); EPCAM and GREM1 (deletion/duplication only).  ?  ?05/21/2021 Surgery  ? Left Lumpectomy: Grade 1 IDC 1.8 cm. LG DCIS, margins neg, HER-2 equivocal by IHC, negative by FISH (ratio 1.45). ER/PR+ >95%, Ki67 1%. ?  ? ? ?CHIEF COMPLIANT: Follow-up of left breast cancer ? ?INTERVAL HISTORY: Erika Brown is a ?81 y.o. with above-mentioned history of invasive ductal carcinoma of the left breast.  She reports to the clinic today for follow-up  ? ?ALLERGIES:  is allergic to iodine, latex, other, and statins. ? ?MEDICATIONS:  ?Current Outpatient Medications  ?Medication Sig Dispense Refill  ? amLODipine (NORVASC) 5 MG tablet Take 1 tablet (5 mg total) by mouth daily. 90 tablet 2  ? Ascorbic Acid (VITAMIN C) 1000 MG tablet Take 1,000 mg by mouth daily.    ? aspirin 81 MG EC tablet Take 81 mg by mouth daily.    ? Biotin 10000 MCG TABS Take 5,000 mcg by mouth daily.    ? Cholecalciferol (VITAMIN D3) 125 MCG (5000 UT) CAPS Take 5,000 Units by mouth daily.    ? clopidogrel (PLAVIX) 75 MG tablet Take 1 tablet (75 mg total) by mouth daily. 90 tablet 2  ? ezetimibe (ZETIA) 10 MG tablet Take 0.5 tablets (5 mg total) by mouth daily. 45 tablet 2  ? irbesartan-hydrochlorothiazide (AVALIDE) 300-12.5 MG tablet Take 1 tablet by mouth daily. 90 tablet 2  ? metoprolol succinate (TOPROL-XL) 50 MG 24 hr tablet Take 1 tablet (50 mg total) by mouth  daily. Take with or immediately following a meal. 90 tablet 2  ? neomycin-polymyxin-hydrocortisone (CORTISPORIN) OTIC solution Place 3 drops into the left ear 4 (four) times daily for 7 days. 10 mL 0  ? nitroGLYCERIN (NITROSTAT) 0.4 MG SL tablet Place 1 tablet (0.4 mg total) under the tongue every 5 (five)  minutes as needed for chest pain. 75 tablet 1  ? potassium chloride SA (KLOR-CON M) 20 MEQ tablet Take 1 tablet (20 mEq total) by mouth daily. 90 tablet 2  ? zinc gluconate 50 MG tablet Take 50 mg by mouth daily.    ? ?No current facility-administered medications for this visit.  ? ? ?PHYSICAL EXAMINATION: ?ECOG PERFORMANCE STATUS: {CHL ONC ECOG YK:[id number]} ? ?There were no vitals filed for this visit. ?There were no vitals filed for this visit. ? ?BREAST:*** No palpable masses or nodules in either right or left breasts. No palpable axillary supraclavicular or infraclavicular adenopathy no breast tenderness or nipple discharge. (exam performed in the presence of a chaperone) ? ?LABORATORY DATA:  ?I have reviewed the data as listed ? ?  Latest Ref Rng & Units 11/03/2021  ? 10:25 AM 09/15/2021  ?  8:56 AM 05/19/2021  ?  2:27 PM  ?CMP  ?Glucose 70 - 99 mg/dL  100   102    ?BUN 6 - 23 mg/dL  22   22    ?Creatinine 0.40 - 1.20 mg/dL  0.79   0.82    ?Sodium 135 - 145 mEq/L  139   137    ?Potassium 3.5 - 5.1 mEq/L  3.6   3.7    ?Chloride 96 - 112 mEq/L  103   101    ?CO2 19 - 32 mEq/L  28   28    ?Calcium 8.4 - 10.5 mg/dL  9.9   9.9    ?Total Protein 6.0 - 8.5 g/dL 6.4      ?Total Bilirubin 0.0 - 1.2 mg/dL 0.4      ?Alkaline Phos 44 - 121 IU/L 90      ?AST 0 - 40 IU/L 16      ?ALT 0 - 32 IU/L 13      ? ? ?Lab Results  ?Component Value Date  ? WBC 6.5 05/19/2021  ? HGB 12.7 05/19/2021  ? HCT 40.0 05/19/2021  ? MCV 87.1 05/19/2021  ? PLT 213 05/19/2021  ? NEUTROABS 3.2 04/01/2021  ? ? ?ASSESSMENT & PLAN:  ?No problem-specific Assessment & Plan notes found for this encounter. ? ? ? ?No orders of the defined types were placed in this encounter. ? ?The patient has a good understanding of the overall plan. she agrees with it. she will call with any problems that may develop before the next visit here. ?Total time spent: 30 mins including face to face time and time spent for planning, charting and co-ordination of care ? ?  Suzzette Righter, CMA ?03/04/22 ? ? ? I Anuar Walgren, Tashara Suder am scribing for Dr. Lindi Adie ? ?***  ?

## 2022-03-09 ENCOUNTER — Encounter (HOSPITAL_COMMUNITY): Payer: Self-pay

## 2022-03-10 NOTE — Progress Notes (Signed)
? ?Patient Care Team: ?Martinique, Betty G, MD as PCP - General (Family Medicine) ?Jettie Booze, MD as PCP - Cardiology (Cardiology) ?Rolm Bookbinder, MD as Consulting Physician (General Surgery) ?Nicholas Lose, MD as Consulting Physician (Hematology and Oncology) ?Kyung Rudd, MD as Consulting Physician (Radiation Oncology) ?Gardenia Phlegm, NP as Nurse Practitioner (Hematology and Oncology) ?Harmon Pier, RN as Registered Nurse ? ?DIAGNOSIS:  ?Encounter Diagnosis  ?Name Primary?  ? Malignant neoplasm of upper-outer quadrant of left breast in female, estrogen receptor positive ([place])   ? ? ?SUMMARY OF ONCOLOGIC HISTORY: ?Oncology History  ?Malignant neoplasm of upper-outer quadrant of left breast in female, estrogen receptor positive ([place])  ?03/30/2021 Initial Diagnosis  ? Patient palpated a left breast abnormality for 7 weeks. Diagnostic mammogram and US showed a 1.6cm mass at the 3 o'clock position in the left breast and no left axillary adenopathy. Biopsy showed invasive and in situ ductal carcinoma, grade 1, HER-2 equivocal by IHC, negative by FISH (ratio 1.45). ER/PR+ >95%, Ki67 1%. ?  ?04/01/2021 Cancer Staging  ? Staging form: Breast, AJCC 8th Edition ?- Clinical stage from 04/01/2021: Stage IA (cT1c, cN0, cM0, G1, ER+, PR+, HER2-) - Signed by Nicholas Lose, MD on 04/01/2021 ?Stage prefix: Initial diagnosis ?Histologic grading system: 3 grade system ? ?  ?04/13/2021 Genetic Testing  ? Negative hereditary cancer genetic testing: no pathogenic variants detected in [facility] CancerNext-Expanded +RNAinsight Panel.  Variant of uncertain significance detected in MSH6 at  p.T1100M (c.3299C>T).  The report date is Apr 13, 2021.   ? ?The CancerNext-Expanded gene panel offered by [facility] and includes sequencing, rearrangement, and RNA analysis for the following 77 genes: AIP, ALK, APC, ATM, AXIN2, BAP1, BARD1, BLM, BMPR1A, BRCA1, BRCA2, BRIP1, CDC73, CDH1, CDK4, CDKN1B, CDKN2A, CHEK2, CTNNA1, DICER1, FANCC,  FH, FLCN, GALNT12, KIF1B, LZTR1, MAX, MEN1, MET, MLH1, MSH2, MSH3, MSH6, MUTYH, NBN, NF1, NF2, NTHL1, PALB2, PHOX2B, PMS2, POT1, PRKAR1A, PTCH1, PTEN, RAD51C, RAD51D, RB1, RECQL, RET, SDHA, SDHAF2, SDHB, SDHC, SDHD, SMAD4, SMARCA4, SMARCB1, SMARCE1, STK11, SUFU, TMEM127, TP53, TSC1, TSC2, VHL and XRCC2 (sequencing and deletion/duplication); EGFR, EGLN1, HOXB13, KIT, MITF, PDGFRA, POLD1, and POLE (sequencing only); EPCAM and GREM1 (deletion/duplication only).  ?  ?05/21/2021 Surgery  ? Left Lumpectomy: Grade 1 IDC 1.8 cm. LG DCIS, margins neg, HER-2 equivocal by IHC, negative by FISH (ratio 1.45). ER/PR+ >95%, Ki67 1%. ?  ? ? ?CHIEF COMPLIANT: Follow-up breast cancer ? ?INTERVAL HISTORY: Erika Brown is a 81 y.o. with above-mentioned history of invasive ductal carcinoma of the left breast. She reports to the clinic today for follow-up. She states that the surgery went well. Denies pain and discomfort. She states every once in a while she gets hot and cold. ? ? ?ALLERGIES:  is allergic to iodine, latex, other, and statins. ? ?MEDICATIONS:  ?Current Outpatient Medications  ?Medication Sig Dispense Refill  ? amLODipine (NORVASC) 5 MG tablet Take 1 tablet (5 mg total) by mouth daily. 90 tablet 2  ? Ascorbic Acid (VITAMIN C) 1000 MG tablet Take 1,000 mg by mouth daily.    ? aspirin 81 MG EC tablet Take 81 mg by mouth daily.    ? Biotin 10000 MCG TABS Take 5,000 mcg by mouth daily.    ? Cholecalciferol (VITAMIN D3) 125 MCG (5000 UT) CAPS Take 5,000 Units by mouth daily.    ? clopidogrel (PLAVIX) 75 MG tablet Take 1 tablet (75 mg total) by mouth daily. 90 tablet 2  ? ezetimibe (ZETIA) 10 MG tablet Take 0.5 tablets (5  mg total) by mouth daily. 45 tablet 2  ? irbesartan-hydrochlorothiazide (AVALIDE) 300-12.5 MG tablet Take 1 tablet by mouth daily. 90 tablet 2  ? metoprolol succinate (TOPROL-XL) 50 MG 24 hr tablet Take 1 tablet (50 mg total) by mouth daily. Take with or immediately following a meal. 90 tablet 2  ?  nitroGLYCERIN (NITROSTAT) 0.4 MG SL tablet Place 1 tablet (0.4 mg total) under the tongue every 5 (five) minutes as needed for chest pain. 75 tablet 1  ? potassium chloride SA (KLOR-CON M) 20 MEQ tablet Take 1 tablet (20 mEq total) by mouth daily. 90 tablet 2  ? zinc gluconate 50 MG tablet Take 50 mg by mouth daily.    ? ?No current facility-administered medications for this visit.  ? ? ?PHYSICAL EXAMINATION: ?ECOG PERFORMANCE STATUS: 1 - Symptomatic but completely ambulatory ? ?Vitals:  ? 03/24/22 0934  ?BP: 140/90  ?Pulse: 64  ?Resp: 19  ?Temp: 97.8 ?F (36.6 ?C)  ?SpO2: 100%  ? ?Filed Weights  ? 03/24/22 0934  ?Weight: 145 lb 8 oz (66 kg)  ?  ? ?LABORATORY DATA:  ?I have reviewed the data as listed ? ?  Latest Ref Rng & Units 11/03/2021  ? 10:25 AM 09/15/2021  ?  8:56 AM 05/19/2021  ?  2:27 PM  ?CMP  ?Glucose 70 - 99 mg/dL  100   102    ?BUN 6 - 23 mg/dL  22   22    ?Creatinine 0.40 - 1.20 mg/dL  0.79   0.82    ?Sodium 135 - 145 mEq/L  139   137    ?Potassium 3.5 - 5.1 mEq/L  3.6   3.7    ?Chloride 96 - 112 mEq/L  103   101    ?CO2 19 - 32 mEq/L  28   28    ?Calcium 8.4 - 10.5 mg/dL  9.9   9.9    ?Total Protein 6.0 - 8.5 g/dL 6.4      ?Total Bilirubin 0.0 - 1.2 mg/dL 0.4      ?Alkaline Phos 44 - 121 IU/L 90      ?AST 0 - 40 IU/L 16      ?ALT 0 - 32 IU/L 13      ? ? ?Lab Results  ?Component Value Date  ? WBC 6.5 05/19/2021  ? HGB 12.7 05/19/2021  ? HCT 40.0 05/19/2021  ? MCV 87.1 05/19/2021  ? PLT 213 05/19/2021  ? NEUTROABS 3.2 04/01/2021  ? ? ?ASSESSMENT & PLAN:  ?Malignant neoplasm of upper-outer quadrant of left breast in female, estrogen receptor positive ([place]) ?04/17/2021:Patient palpated a left breast abnormality for 7 weeks. Diagnostic mammogram and US showed a 1.6cm mass at the 3 o'clock position in the left breast and no left axillary adenopathy. Biopsy showed invasive and in situ ductal carcinoma, grade 1, HER-2 equivocal by IHC, negative by FISH (ratio 1.45). ER/PR+ >95%, Ki67 1%. ?  ?05/21/21: Left  Lumpectomy: Grade 1 IDC 1.8 cm. LG DCIS, margins neg, HER-2 equivocal by IHC, negative by FISH (ratio 1.45). ER/PR+ >95%, Ki67 1%. ?  ?Recommendation: Adj Anti-estrogen therapy with letrozole 2.5 mg daily X 5 years (patient does not want to take antiestrogens because of concerns for adverse effects ? ?Breast cancer surveillance: ?mammogram 03/22/2022: Benign breast density category B ? ?Bone density 12/16/2021: T score -2.9: Osteoporosis ? ?Return to clinic on an as-needed basis ? ?No orders of the defined types were placed in this encounter. ? ?The patient has a good  understanding of the overall plan. she agrees with it. she will call with any problems that may develop before the next visit here. ?Total time spent: 30 mins including face to face time and time spent for planning, charting and co-ordination of care ? ? Harriette Ohara, MD ?03/24/22 ? ? ? I Gardiner Coins am scribing for Dr. Lindi Adie ? ?I have reviewed the above documentation for accuracy and completeness, and I agree with the above. ?. ?

## 2022-03-16 ENCOUNTER — Ambulatory Visit: Payer: Medicare HMO | Admitting: Family Medicine

## 2022-03-18 ENCOUNTER — Inpatient Hospital Stay: Payer: Medicare HMO | Admitting: Hematology and Oncology

## 2022-03-22 ENCOUNTER — Ambulatory Visit
Admission: RE | Admit: 2022-03-22 | Discharge: 2022-03-22 | Disposition: A | Discharge status: Home / Self Care | Payer: Medicare HMO | Source: Ambulatory Visit | Attending: Adult Health | Admitting: Adult Health

## 2022-03-22 DIAGNOSIS — Z853 Personal history of malignant neoplasm of breast: Secondary | ICD-10-CM | POA: Diagnosis not present

## 2022-03-22 DIAGNOSIS — R928 Other abnormal and inconclusive findings on diagnostic imaging of breast: Secondary | ICD-10-CM | POA: Diagnosis not present

## 2022-03-22 DIAGNOSIS — Z170 Estrogen receptor positive status [ER+]: Secondary | ICD-10-CM

## 2022-03-24 ENCOUNTER — Other Ambulatory Visit: Payer: Self-pay

## 2022-03-24 ENCOUNTER — Inpatient Hospital Stay: Payer: Medicare HMO | Attending: Hematology and Oncology | Admitting: Hematology and Oncology

## 2022-03-24 DIAGNOSIS — Z853 Personal history of malignant neoplasm of breast: Secondary | ICD-10-CM | POA: Insufficient documentation

## 2022-03-24 DIAGNOSIS — Z170 Estrogen receptor positive status [ER+]: Secondary | ICD-10-CM | POA: Diagnosis not present

## 2022-03-24 DIAGNOSIS — M810 Age-related osteoporosis without current pathological fracture: Secondary | ICD-10-CM | POA: Diagnosis not present

## 2022-03-24 DIAGNOSIS — C50412 Malignant neoplasm of upper-outer quadrant of left female breast: Secondary | ICD-10-CM | POA: Diagnosis not present

## 2022-03-24 NOTE — Assessment & Plan Note (Addendum)
04/17/2021:Patient palpated a left breast abnormality for 7 weeks. Diagnostic mammogram and US showed a 1.6cm mass at the 3 o'clock position in the left breast and no left axillary adenopathy. Biopsy showed invasive and in situ ductal carcinoma, grade 1, HER-2 equivocal by IHC, negative by FISH (ratio 1.45). ER/PR+ >95%, Ki67 1%. ?? ?05/21/21: Left Lumpectomy: Grade 1 IDC 1.8 cm. LG DCIS, margins neg, HER-2 equivocal by IHC, negative by FISH (ratio 1.45). ER/PR+ >95%, Ki67 1%. ?? ?Recommendation: Adj Anti-estrogen therapy with letrozole 2.5 mg daily X 5 years (patient does not want to take antiestrogens because of concerns for adverse effects ? ?Breast cancer surveillance: ?1.  Breast exam 03/24/2022: Benign ?2. mammogram 03/22/2022: Benign breast density category B ? ?Bone density 12/16/2021: T score -2.9: Osteoporosis ? ?Return to clinic on an as-needed basis ?  ? ? ?

## 2022-04-19 ENCOUNTER — Other Ambulatory Visit: Payer: Self-pay

## 2022-04-19 MED ORDER — METOPROLOL SUCCINATE ER 50 MG PO TB24: 50.0000 mg | ORAL_TABLET | Freq: Every day | ORAL | 2 refills | Status: DC

## 2022-04-30 ENCOUNTER — Ambulatory Visit: Payer: Medicare HMO | Admitting: Family Medicine

## 2022-04-30 ENCOUNTER — Ambulatory Visit: Payer: Medicare HMO | Admitting: Interventional Cardiology

## 2022-05-11 ENCOUNTER — Ambulatory Visit: Payer: Medicare HMO | Admitting: Interventional Cardiology

## 2022-05-18 ENCOUNTER — Ambulatory Visit: Payer: Medicare HMO | Admitting: Family Medicine

## 2022-05-19 NOTE — Progress Notes (Unsigned)
HPI: Erika Brown is a 81 y.o. female, who is here today for chronic disease management.  Last seen on 09/15/21  Hypertension:  Medications: Amlodipine 5 mg daily,Irbesartan-hctz 300-12.5 mg daily, metoprolol succinate 50 mg daily. BP readings at home:*** Side effects:none  Negative for unusual or severe headache, visual changes, exertional chest pain, dyspnea,  focal weakness, or edema.  Lab Results  Component Value Date   CREATININE 0.79 09/15/2021   BUN 22 09/15/2021   NA 139 09/15/2021   K 3.6 09/15/2021   CL 103 09/15/2021   CO2 28 09/15/2021     Hyperlipidemia: Currently on Zetia 10 mg daily. Following a low fat diet: ***. Side effects from medication:none Lab Results  Component Value Date   CHOL 200 (H) 11/03/2021   HDL 79 11/03/2021   LDLCALC 109 (H) 11/03/2021   LDLDIRECT 121.7 09/08/2007   TRIG 66 11/03/2021   CHOLHDL 2.5 11/03/2021     Review of Systems Rest of ROS see pertinent positives and negatives in HPI.  Current Outpatient Medications on File Prior to Visit  Medication Sig Dispense Refill   amLODipine (NORVASC) 5 MG tablet Take 1 tablet (5 mg total) by mouth daily. 90 tablet 2   Ascorbic Acid (VITAMIN C) 1000 MG tablet Take 1,000 mg by mouth daily.     aspirin 81 MG EC tablet Take 81 mg by mouth daily.     Biotin 10000 MCG TABS Take 5,000 mcg by mouth daily.     Cholecalciferol (VITAMIN D3) 125 MCG (5000 UT) CAPS Take 5,000 Units by mouth daily.     clopidogrel (PLAVIX) 75 MG tablet Take 1 tablet (75 mg total) by mouth daily. 90 tablet 2   ezetimibe (ZETIA) 10 MG tablet Take 0.5 tablets (5 mg total) by mouth daily. 45 tablet 2   irbesartan-hydrochlorothiazide (AVALIDE) 300-12.5 MG tablet Take 1 tablet by mouth daily. 90 tablet 2   metoprolol succinate (TOPROL-XL) 50 MG 24 hr tablet Take 1 tablet (50 mg total) by mouth daily. Take with or immediately following a meal. 90 tablet 2   nitroGLYCERIN (NITROSTAT) 0.4 MG SL tablet Place 1  tablet (0.4 mg total) under the tongue every 5 (five) minutes as needed for chest pain. 75 tablet 1   potassium chloride SA (KLOR-CON M) 20 MEQ tablet Take 1 tablet (20 mEq total) by mouth daily. 90 tablet 2   zinc gluconate 50 MG tablet Take 50 mg by mouth daily.     No current facility-administered medications on file prior to visit.    Past Medical History:  Diagnosis Date   Anginal pain ([place])    Anxiety and depression    Breast cancer ([place])    Breast cancer ([place])    CAD (coronary artery disease)    S/P NSTEMI 4/06 EF 60%; Treated with Cypher DES to LAD and RCA with kissing balloon PCI diagonal; Treadmill myoview 10/09 for CP: Walked 7:06. mild ST-T-wave changes in recovery. EF 71%. Very mild reversible defect in base of the inferior wall --> medical rx.   Cancer [facility])    HISTORY OF SKIN CANCER   Cataract    Complication of anesthesia    " DIFFICULT TO WAKE UP "   Depression    Family history of breast cancer 04/01/2021   Family history of colon cancer 04/01/2021   Family history of malignant melanoma 04/01/2021   Glucose intolerance (impaired glucose tolerance)    History of echocardiogram    Echo 11/17: Vigorous LVF,  EF 65-70, normal wall motion, borderline diastolic function, systolic bowing of mitral valve without prolapse, mild MR, trivial TR, PASP 20   History of medication noncompliance    Hyperlipidemia    Intolerate to multiple statins due to severe myalgias   Hypertension    Possible white-coat component   MI (myocardial infarction) (HCC)    acute-s/p stent   Microhematuria    Pneumonia due to COVID-19 virus    Psoriasis    Rapid heart beat    Vitamin D deficiency    Allergies  Allergen Reactions   Iodine Shortness Of Breath and Palpitations    Has had some SHOB and rapid heart rate in the past   Latex Other (See Comments)    discomfort   Other Other (See Comments)    Some foods b/c of preservatives    Statins Other (See Comments)    Intolerance to high  dosages, myalgia    Social History   Socioeconomic History   Marital status: Widowed    Spouse name: Not on file   Number of children: 7   Years of education: Not on file   Highest education level: Not on file  Occupational History   Occupation: Works at home, Charity fundraiser, 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  Tobacco Use   Smoking status: Never   Smokeless tobacco: Never   Tobacco comments:    only a few times  Vaping Use   Vaping Use: Never used  Substance and Sexual Activity   Alcohol use: Yes    Alcohol/week: 4.0 standard drinks of alcohol    Types: 4 Glasses of wine per week   Drug use: No   Sexual activity: Not Currently    Birth control/protection: Abstinence, Surgical    Comment: TV[facility]  Other Topics Concern   Not on file  Social History Narrative   Lives in [place] with her husband   Seven kids   Plays tennis   Very good card player   Social Determinants of Health   Financial Resource Strain: Low Risk  (02/02/2022)   Overall Financial Resource Strain (CARDIA)    Difficulty of Paying Living Expenses: Not hard at all  Food Insecurity: No Food Insecurity (02/02/2022)   Hunger Vital Sign    Worried About Running Out of Food in the Last Year: Never true    Bruce in the Last Year: Never true  Transportation Needs: No Transportation Needs (02/02/2022)   PRAPARE - Hydrologist (Medical): No    Lack of Transportation (Non-Medical): No  Physical Activity: Inactive (02/02/2022)   Exercise Vital Sign    Days of Exercise per Week: 0 days    Minutes of Exercise per Session: 0 min  Stress: No Stress Concern Present (02/02/2022)   [place]    Feeling of Stress : Only a little  Social Connections: Moderately Integrated (09/17/2021)   Social Connection and Isolation Panel [NHANES]    Frequency of Communication with Friends and Family: More than three times a week    Frequency of Social  Gatherings with Friends and Family: More than three times a week    Attends Religious Services: More than 4 times per year    Active Member of [employer] or Organizations: Yes    Attends Archivist Meetings: More than 4 times per year    Marital Status: Widowed    There were no vitals filed for this visit. There is no height or  weight on file to calculate BMI.  Physical Exam  ASSESSMENT AND PLAN:  Diagnoses and all orders for this visit:  Hyperlipidemia LDL goal <70  Hypertension with heart disease    No orders of the defined types were placed in this encounter.   No problem-specific Assessment & Plan notes found for this encounter.   No follow-ups on file.  Betty G. Martin[place], MD  [facility]. [place] office.

## 2022-05-21 ENCOUNTER — Ambulatory Visit (INDEPENDENT_AMBULATORY_CARE_PROVIDER_SITE_OTHER): Payer: Medicare HMO | Admitting: Family Medicine

## 2022-05-21 ENCOUNTER — Encounter: Payer: Self-pay | Admitting: Family Medicine

## 2022-05-21 VITALS — BP 120/70 | HR 86 | Resp 16 | Ht 64.0 in | Wt 148.2 lb

## 2022-05-21 DIAGNOSIS — H6123 Impacted cerumen, bilateral: Secondary | ICD-10-CM | POA: Diagnosis not present

## 2022-05-21 DIAGNOSIS — I119 Hypertensive heart disease without heart failure: Secondary | ICD-10-CM

## 2022-05-21 DIAGNOSIS — E785 Hyperlipidemia, unspecified: Secondary | ICD-10-CM

## 2022-05-21 DIAGNOSIS — E559 Vitamin D deficiency, unspecified: Secondary | ICD-10-CM | POA: Diagnosis not present

## 2022-05-21 DIAGNOSIS — E876 Hypokalemia: Secondary | ICD-10-CM | POA: Diagnosis not present

## 2022-05-21 LAB — BASIC METABOLIC PANEL
BUN: 21 mg/dL (ref 6–23)
CO2: 31 mEq/L (ref 19–32)
Calcium: 10.2 mg/dL (ref 8.4–10.5)
Chloride: 101 mEq/L (ref 96–112)
Creatinine, Ser: 0.77 mg/dL (ref 0.40–1.20)
GFR: 71.83 mL/min (ref >60.00)
Glucose, Bld: 84 mg/dL (ref 70–99)
Potassium: 4.2 mEq/L (ref 3.5–5.1)
Sodium: 139 mEq/L (ref 135–145)

## 2022-05-21 LAB — VITAMIN D 25 HYDROXY (VIT D DEFICIENCY, FRACTURES): VITD: 54.59 ng/mL (ref 30.00–100.00)

## 2022-05-26 DIAGNOSIS — H903 Sensorineural hearing loss, bilateral: Secondary | ICD-10-CM | POA: Diagnosis not present

## 2022-05-26 DIAGNOSIS — H6123 Impacted cerumen, bilateral: Secondary | ICD-10-CM | POA: Diagnosis not present

## 2022-07-18 NOTE — Progress Notes (Signed)
Cardiology Office Note   Date:  07/19/2022   ID:  Rajanee, Schuelke 1941-05-23, MRN [id number]  PCP:  [place], Betty G, MD    No chief complaint on file.  CAD  Wt Readings from Last 3 Encounters:  07/19/22 146 lb 6.4 oz (66.4 kg)  05/21/22 148 lb 4 oz (67.2 kg)  03/24/22 145 lb 8 oz (66 kg)       History of Present Illness: CHINIQUA KILCREASE is a 81 y.o. female  with CAD who is  aformer patient of Dr. Saunders Revel with coronary artery disease status post Cypher DES to the LAD and Cypher DES to the RCA as well as balloon angioplasty of the diagonal in 03-06-05 in the setting of non-ST elevation myocardial infarction, hypertension, hyperlipidemia.  Most recently, she underwent cardiac catheterization in April 2019 after an abnormal stress test.  This demonstrated in-stent restenosis in the LAD as well as severe stenosis in the LCx and RCA.  She underwent multivessel stenting with a DES to the proximal-mid LAD, DES to the mid LCx and DES to the proximal-mid RCA.  Last seen by Dr. Saunders Revel in July 2019.  At that time, she complained of some palpitations and her beta-blocker dose was adjusted.  They also discussed +/- ezetimibe therapy.  She has intolerance to multiple statins and is not inclined to try PCSK-9 inhibitor therapy.   She had three vessel stenting in April 2019.  After 03-06-18, she started following up with me.   SHe was trying to eat healthier.  She was decreasing meat intake.  She started Zetia in 2023/10/12.       Her husband died from [place] in early 03-06-20.  He was diagnosed with COVID in early Jan.  She got COVID from him at a nursing home. She was hospitalized for 4 days.  She received steroids, remdesivir.    In Feb 2021, it was noted: "She feels that she is having nosebleeds.  HR seems to be higher in general. Even now, while she is talking, HR is 103. Using oxygen at night, 2L. "   I recommended a humidifier to help.    Son has long [place].     She was diagnosed with Breast cancer. Has been  treated.  Following with Dr. Lindi Adie.     Likes to garden- still works in the yard even in the [employer].   Denies : Chest pain. Dizziness. Leg edema. Nitroglycerin use. Orthopnea. Palpitations. Paroxysmal nocturnal dyspnea. Shortness of breath. Syncope.    She does some more exercising- Cardio-drumming.  Sessions are 30 minutes.      Past Medical History:  Diagnosis Date   Anginal pain ([place])    Anxiety and depression    Breast cancer ([place])    Breast cancer ([place])    CAD (coronary artery disease)    S/P NSTEMI 4/06 EF 60%; Treated with Cypher DES to LAD and RCA with kissing balloon PCI diagonal; Treadmill myoview 10/09 for CP: Walked 7:06. mild ST-T-wave changes in recovery. EF 71%. Very mild reversible defect in base of the inferior wall --> medical rx.   Cancer [facility])    HISTORY OF SKIN CANCER   Cataract    Complication of anesthesia    " DIFFICULT TO WAKE UP "   Depression    Family history of breast cancer 04/01/2021   Family history of colon cancer 04/01/2021   Family history of malignant melanoma 04/01/2021   Glucose intolerance (impaired glucose tolerance)  History of echocardiogram    Echo 11/17: Vigorous LVF, EF 65-70, normal wall motion, borderline diastolic function, systolic bowing of mitral valve without prolapse, mild MR, trivial TR, PASP 20   History of medication noncompliance    Hyperlipidemia    Intolerate to multiple statins due to severe myalgias   Hypertension    Possible white-coat component   MI (myocardial infarction) ([place])    acute-s/p stent   Microhematuria    Pneumonia due to COVID-19 virus    Psoriasis    Rapid heart beat    Vitamin D deficiency     Past Surgical History:  Procedure Laterality Date   ABDOMINAL HYSTERECTOMY     and anterior repair   APPENDECTOMY     bladder tact     BREAST BIOPSY     BREAST LUMPECTOMY Right 05/21/2021   BREAST LUMPECTOMY WITH RADIOACTIVE SEED LOCALIZATION Left 05/21/2021   Procedure: LEFT BREAST  LUMPECTOMY WITH RADIOACTIVE SEED LOCALIZATION;  Surgeon: Rolm Bookbinder, MD;  Location: [facility];  Service: General;  Laterality: Left;   CARDIAC CATHETERIZATION  02/28/2018   CATARACT EXTRACTION Bilateral 2017   Dr. Katy Fitch   CORONARY STENT INTERVENTION N/A 02/28/2018   Procedure: CORONARY STENT INTERVENTION;  Surgeon: Jettie Booze, MD;  Location: [facility];  Service: Cardiovascular;  Laterality: N/A;   CORONARY STENT PLACEMENT     CORONARY STENT PLACEMENT  04/02/019   EYE SURGERY Bilateral    cataract   HEMORRHOID SURGERY     LEFT HEART CATH AND CORONARY ANGIOGRAPHY N/A 02/28/2018   Procedure: LEFT HEART CATH AND CORONARY ANGIOGRAPHY;  Surgeon: Jettie Booze, MD;  Location: [facility];  Service: Cardiovascular;  Laterality: N/A;   MOHS SURGERY  07/2013   BCC.  [facility]   RECTAL PROLAPSE REPAIR  1976   TONSILLECTOMY AND ADENOIDECTOMY     ULTRASOUND GUIDANCE FOR VASCULAR ACCESS  02/28/2018   Procedure: Ultrasound Guidance For Vascular Access;  Surgeon: Jettie Booze, MD;  Location: [facility];  Service: Cardiovascular;;     Current Outpatient Medications  Medication Sig Dispense Refill   amLODipine (NORVASC) 5 MG tablet Take 1 tablet (5 mg total) by mouth daily. 90 tablet 2   aspirin 81 MG EC tablet Take 81 mg by mouth daily.     Biotin 5000 MCG TABS Take 1 tablet by mouth daily.     Cholecalciferol (VITAMIN D3) 125 MCG (5000 UT) CAPS Take 5,000 Units by mouth daily.     clopidogrel (PLAVIX) 75 MG tablet Take 1 tablet (75 mg total) by mouth daily. 90 tablet 2   ezetimibe (ZETIA) 10 MG tablet Take 0.5 tablets (5 mg total) by mouth daily. 45 tablet 2   irbesartan-hydrochlorothiazide (AVALIDE) 300-12.5 MG tablet Take 1 tablet by mouth daily. 90 tablet 2   metoprolol succinate (TOPROL-XL) 50 MG 24 hr tablet Take 1 tablet (50 mg total) by mouth daily. Take with or immediately following a meal. 90 tablet 2   nitroGLYCERIN (NITROSTAT) 0.4 MG SL  tablet Place 1 tablet (0.4 mg total) under the tongue every 5 (five) minutes as needed for chest pain. 75 tablet 1   potassium chloride SA (KLOR-CON M) 20 MEQ tablet Take 1 tablet (20 mEq total) by mouth daily. 90 tablet 2   vitamin C (ASCORBIC ACID) 500 MG tablet Take 500 mg by mouth daily.     zinc gluconate 50 MG tablet Take 50 mg by mouth daily.     No current  facility-administered medications for this visit.    Allergies:   Iodine, Latex, Other, and Statins    Social History:  The patient  reports that she has never smoked. She has never used smokeless tobacco. She reports current alcohol use of about 4.0 standard drinks of alcohol per week. She reports that she does not use drugs.   Family History:  The patient's family history includes Asthma in her brother; Breast cancer (age of onset: 51) in her daughter; Breast cancer (age of onset: 84) in her sister; Colon cancer (age of onset: 7) in her son; Depression in her daughter; Diabetes in her paternal grandmother; Heart attack in her father; Heart disease in her father; Hypertension in her brother, maternal grandfather, maternal grandmother, and mother; Lung cancer in her sister; Melanoma in her daughter and mother; Renal Disease in her maternal grandfather; Stroke in her maternal grandfather and maternal grandmother; Ulcers in her father.    ROS:  Please see the history of present illness.   Otherwise, review of systems are positive for back pain.   All other systems are reviewed and negative.    PHYSICAL EXAM: VS:  BP (!) 146/80   Pulse 75   Ht 5' 4.5" (1.638 m)   Wt 146 lb 6.4 oz (66.4 kg)   LMP 11/29/1974   SpO2 96%   BMI 24.74 kg/m  , BMI Body mass index is 24.74 kg/m. GEN: Well nourished, well developed, in no acute distress HEENT: normal Neck: no JVD, carotid bruits, or masses Cardiac: RRR, PACs; no murmurs, rubs, or gallops,no edema  Respiratory:  clear to auscultation bilaterally, normal work of breathing GI: soft,  nontender, nondistended, + BS MS: no deformity or atrophy Skin: warm and dry, no rash Neuro:  Strength and sensation are intact Psych: euthymic mood, full affect   EKG:   The ekg ordered today demonstrates NSR, PACs   Recent Labs: 11/03/2021: ALT 13 05/21/2022: BUN 21; Creatinine, Ser 0.77; Potassium 4.2; Sodium 139   Lipid Panel    Component Value Date/Time   CHOL 200 (H) 11/03/2021 1025   TRIG 66 11/03/2021 1025   HDL 79 11/03/2021 1025   CHOLHDL 2.5 11/03/2021 1025   CHOLHDL 3 03/10/2021 0837   VLDL 18.2 03/10/2021 0837   LDLCALC 109 (H) 11/03/2021 1025   LDLDIRECT 121.7 09/08/2007 0852     Other studies Reviewed: Additional studies/ records that were reviewed today with results demonstrating: Labs reviewed.   ASSESSMENT AND PLAN:  CAD: No angina on medical therapy.  Continue aggressive secondary prevention.  Hyperlipidemia: Did not tolerate statins.  Tried to do Zetia and this caused problems and only taking 5 mg daily.  Given the history of coronary artery disease and stents, it is reasonable to try a PCSK9 inhibitor to hopefully prevent more disease in the future.  Refer to lipid clinic.   HTN: The current medical regimen is effective;  continue present plan and medications.  Repeat readings in the office today is high but home readings are in the 161W systolic range.   Avoiding processed foods.  She is trying to increase exercise as well. Mitral regurgitation: No CHF sx.    Current medicines are reviewed at length with the patient today.  The patient concerns regarding her medicines were addressed.  The following changes have been made:  No change  Labs/ tests ordered today include:  No orders of the defined types were placed in this encounter.   Recommend 150 minutes/week of aerobic exercise Low fat, low  carb, high fiber diet recommended  Disposition:   FU in 1 year   Signed, Larae Grooms, MD  07/19/2022 9:42 AM    [facility]  [facility] [address], [place], [place]  [address] Phone: [phone]; Fax: [phone]

## 2022-07-19 ENCOUNTER — Ambulatory Visit: Payer: Medicare HMO | Admitting: Interventional Cardiology

## 2022-07-19 ENCOUNTER — Encounter: Payer: Self-pay | Admitting: Interventional Cardiology

## 2022-07-19 VITALS — BP 148/72 | HR 75 | Ht 64.5 in | Wt 146.4 lb

## 2022-07-19 DIAGNOSIS — I2510 Atherosclerotic heart disease of native coronary artery without angina pectoris: Secondary | ICD-10-CM

## 2022-07-19 DIAGNOSIS — I340 Nonrheumatic mitral (valve) insufficiency: Secondary | ICD-10-CM

## 2022-07-19 DIAGNOSIS — E785 Hyperlipidemia, unspecified: Secondary | ICD-10-CM | POA: Diagnosis not present

## 2022-07-19 DIAGNOSIS — I10 Essential (primary) hypertension: Secondary | ICD-10-CM

## 2022-07-19 NOTE — Patient Instructions (Signed)
Medication Instructions:  Your physician recommends that you continue on your current medications as directed. Please refer to the Current Medication list given to you today.  *If you need a refill on your cardiac medications before your next appointment, please call your pharmacy*   Lab Work: None If you have labs (blood work) drawn today and your tests are completely normal, you will receive your results only by: Central (if you have MyChart) OR A paper copy in the mail If you have any lab test that is abnormal or we need to change your treatment, we will call you to review the results.   Follow-Up: At [facility], you and your health needs are our priority.  As part of our continuing mission to provide you with exceptional heart care, we have created designated Provider Care Teams.  These Care Teams include your primary Cardiologist (physician) and Advanced Practice Providers (APPs -  Physician Assistants and Nurse Practitioners) who all work together to provide you with the care you need, when you need it.  Your next appointment:   6 month(s)  The format for your next appointment:   In Person  Provider:   Larae Grooms, MD {  Important Information About Sugar

## 2022-07-22 NOTE — Progress Notes (Signed)
Patient ID: Erika Brown                 DOB: 12-Sep-1941                    MRN: [id number]      HPI: Erika Brown is a 81 y.o. female patient referred to lipid clinic by Dr. Irish Lack. PMH is significant for CAD, NSTEMI with DES to LAD and RCA and balloon angioplasty of diagonal in 2006, HTN, HLD, MVR, breast cancer. Cardiac cath on 02/28/2018 after abnormal stress test demonstrated in-stent restenosis in LAD and severe stenosis in Lcx and RCA and underwent DES to proximal-mLAD, mLCx and proximal-mRCA. Pt saw Dr. Saunders Revel July 2019 and discussed ezetimibe due to multiple statin intolerances and was not interested in starting PSCK-9 inhibitor at that time. At recent visit with Dr. Irish Lack 07/19/22, it was noted that patient did not tolerated statins in the past and tried to tolerate ezetimibe 10 mg daily but had to reduce dose to 5 mg daily due to problems.  Pt presents to lipid clinic today. She confirms that when she was taking ezetimibe 10 mg daily she was experiencing muscle aches. She is able to tolerate ezetimibe 5 mg daily. Pt shares that she is very skeptical of medications and the pharmaceutical industry. She shares that she has lived through covid-19 but her husband passed away from covid-19. She also had a bad reaction to the covid shot and won't take flu shots. She shares that she is less concerned about her risk of heart attack but more concerned about her risk of stroke and that she would be a burden to her family should this occur. She shares that she has the ability to pay for her medications and that her concerns with taking a new medication are not related to her ability to afford it. She is not sure if at her age, it is worth it.   Current Medications: ezetimibe 5 mg daily  Intolerances: atorvastatin 80 mg daily, pravastatin 20 mg every other day (myalgias)  Risk Factors: CAD, Hx NSTEMI, HLD, HTN  LDL goal: <55   Diet:   Exercise: She does some more exercising- Cardio-drumming.  Sessions are 30 minutes.   Likes to garden- still works in the yard even in the [employer].   Family History: Diabetes in her paternal grandmother; Heart attack in her father; Heart disease in her father; Hypertension and MI in her brother, maternal grandfather, maternal grandmother, and mother; [place] Disease in her maternal grandfather; Stroke in her maternal grandfather and maternal grandmother  Social History: The patient  reports that she has never smoked. She has never used smokeless tobacco. She reports current alcohol use of about 4.0 standard drinks of alcohol per week. She reports that she does not use drugs.     Labs: 11/03/21 - TC 200, TG 66, HDL 79, LDL 109 (ezetimibe 5 mg daily)    Past Medical History:  Diagnosis Date   Anginal pain ([place])    Anxiety and depression    Breast cancer ([place])    Breast cancer ([place])    CAD (coronary artery disease)    S/P NSTEMI 4/06 EF 60%; Treated with Cypher DES to LAD and RCA with kissing balloon PCI diagonal; Treadmill myoview 10/09 for CP: Walked 7:06. mild ST-T-wave changes in recovery. EF 71%. Very mild reversible defect in base of the inferior wall --> medical rx.   Cancer [facility])    HISTORY OF SKIN CANCER  Cataract    Complication of anesthesia    " DIFFICULT TO WAKE UP "   Depression    Family history of breast cancer 04/01/2021   Family history of colon cancer 04/01/2021   Family history of malignant melanoma 04/01/2021   Glucose intolerance (impaired glucose tolerance)    History of echocardiogram    Echo 11/17: Vigorous LVF, EF 65-70, normal wall motion, borderline diastolic function, systolic bowing of mitral valve without prolapse, mild MR, trivial TR, PASP 20   History of medication noncompliance    Hyperlipidemia    Intolerate to multiple statins due to severe myalgias   Hypertension    Possible white-coat component   MI (myocardial infarction) ([facility])    acute-s/p stent   Microhematuria    Pneumonia due to COVID-19  virus    Psoriasis    Rapid heart beat    Vitamin D deficiency     Current Outpatient Medications on File Prior to Visit  Medication Sig Dispense Refill   amLODipine (NORVASC) 5 MG tablet Take 1 tablet (5 mg total) by mouth daily. 90 tablet 2   aspirin 81 MG EC tablet Take 81 mg by mouth daily.     Biotin 5000 MCG TABS Take 1 tablet by mouth daily.     Cholecalciferol (VITAMIN D3) 125 MCG (5000 UT) CAPS Take 5,000 Units by mouth daily.     clopidogrel (PLAVIX) 75 MG tablet Take 1 tablet (75 mg total) by mouth daily. 90 tablet 2   ezetimibe (ZETIA) 10 MG tablet Take 0.5 tablets (5 mg total) by mouth daily. 45 tablet 2   irbesartan-hydrochlorothiazide (AVALIDE) 300-12.5 MG tablet Take 1 tablet by mouth daily. 90 tablet 2   metoprolol succinate (TOPROL-XL) 50 MG 24 hr tablet Take 1 tablet (50 mg total) by mouth daily. Take with or immediately following a meal. 90 tablet 2   nitroGLYCERIN (NITROSTAT) 0.4 MG SL tablet Place 1 tablet (0.4 mg total) under the tongue every 5 (five) minutes as needed for chest pain. 75 tablet 1   potassium chloride SA (KLOR-CON M) 20 MEQ tablet Take 1 tablet (20 mEq total) by mouth daily. 90 tablet 2   vitamin C (ASCORBIC ACID) 500 MG tablet Take 500 mg by mouth daily.     zinc gluconate 50 MG tablet Take 50 mg by mouth daily.     No current facility-administered medications on file prior to visit.    Allergies  Allergen Reactions   Iodine Shortness Of Breath and Palpitations    Has had some SHOB and rapid heart rate in the past   Latex Other (See Comments)    discomfort   Other Other (See Comments)    Some foods b/c of preservatives    Statins Other (See Comments)    Intolerance to high dosages, myalgia    Assessment/Plan:  1. Hyperlipidemia - LDL of 109 is above goal of <55. Discussed with patient that Repatha is a twice monthly injection that would lower LDL-C by up to 60%. Discussed adverse effects such as injection site reaction and nasopharyngitis.  Discussed in great detail with the patient about the Repatha clinical trial and discussed benefits of preventing heart attack and stroke. Discussed that Repatha would cost about $47, and once in the coverage gap would cost about $150. Patient will continue taking ezetimibe 5 mg daily. Patient was provided with patient information document for Repatha. Patient will give [place] a call should she decide to start taking Repatha.   Thank you,  Eliseo Gum, PharmD PGY1 Pharmacy Resident   07/22/2022  10:19 PM   Ramond Dial, Pharm.D, BCPS, CPP [place]  [address] [place], [place] [address]  Phone: [phone]; Fax: [phone]

## 2022-07-23 ENCOUNTER — Ambulatory Visit: Payer: Medicare HMO | Admitting: Pharmacist

## 2022-07-23 DIAGNOSIS — I2510 Atherosclerotic heart disease of native coronary artery without angina pectoris: Secondary | ICD-10-CM

## 2022-07-23 DIAGNOSIS — E785 Hyperlipidemia, unspecified: Secondary | ICD-10-CM | POA: Diagnosis not present

## 2022-09-08 ENCOUNTER — Other Ambulatory Visit: Payer: Self-pay | Admitting: Interventional Cardiology

## 2022-09-08 DIAGNOSIS — I2510 Atherosclerotic heart disease of native coronary artery without angina pectoris: Secondary | ICD-10-CM

## 2022-11-02 DIAGNOSIS — H903 Sensorineural hearing loss, bilateral: Secondary | ICD-10-CM | POA: Diagnosis not present

## 2022-11-02 DIAGNOSIS — H6123 Impacted cerumen, bilateral: Secondary | ICD-10-CM | POA: Diagnosis not present

## 2022-11-15 DIAGNOSIS — H6123 Impacted cerumen, bilateral: Secondary | ICD-10-CM | POA: Diagnosis not present

## 2022-12-08 ENCOUNTER — Encounter: Payer: Medicare HMO | Admitting: Family Medicine

## 2022-12-13 NOTE — Progress Notes (Deleted)
HPI: [name] is a 82 y.o. female, who is here today for her routine physical.  Last CPE: 02/02/22 AWV  Regular exercise 3 or more time per week: *** Following a healthy diet: ***  Chronic medical problems: ***  Immunization History  Administered Date(s) Administered   PFIZER(Purple Top)SARS-COV-2 Vaccination 06/25/2020   Pneumococcal Conjugate-13 11/10/2015   Pneumococcal Polysaccharide-23 07/18/2006, 11/07/2019   Tdap 03/23/2010, 03/24/2020   Health Maintenance  Topic Date Due   Zoster Vaccines- Shingrix (1 of 2) Never done   COVID-19 Vaccine (2 - Pfizer risk series) 07/16/2020   INFLUENZA VACCINE  02/27/2023 (Originally 06/29/2022)   DTaP/Tdap/Td (3 - Td or Tdap) 03/24/2030   Pneumonia Vaccine 65+ Years old  Completed   HPV VACCINES  Aged Out   DEXA SCAN  Discontinued    She has *** concerns today.  Review of Systems  Current Outpatient Medications on File Prior to Visit  Medication Sig Dispense Refill   amLODipine (NORVASC) 5 MG tablet TAKE 1 TABLET EVERY DAY 90 tablet 1   aspirin 81 MG EC tablet Take 81 mg by mouth daily.     Biotin 5000 MCG TABS Take 1 tablet by mouth daily.     Cholecalciferol (VITAMIN D3) 125 MCG (5000 UT) CAPS Take 5,000 Units by mouth daily.     clopidogrel (PLAVIX) 75 MG tablet TAKE 1 TABLET EVERY DAY 90 tablet 1   ezetimibe (ZETIA) 10 MG tablet TAKE 1/2 TABLET EVERY DAY 45 tablet 1   irbesartan-hydrochlorothiazide (AVALIDE) 300-12.5 MG tablet TAKE 1 TABLET EVERY DAY 90 tablet 1   metoprolol succinate (TOPROL-XL) 50 MG 24 hr tablet Take 1 tablet (50 mg total) by mouth daily. Take with or immediately following a meal. 90 tablet 2   nitroGLYCERIN (NITROSTAT) 0.4 MG SL tablet Place 1 tablet (0.4 mg total) under the tongue every 5 (five) minutes as needed for chest pain. 75 tablet 1   potassium chloride SA (KLOR-CON M) 20 MEQ tablet TAKE 1 TABLET EVERY DAY 90 tablet 1   vitamin C (ASCORBIC ACID) 500 MG tablet Take 500 mg by mouth daily.      zinc gluconate 50 MG tablet Take 50 mg by mouth daily.     No current facility-administered medications on file prior to visit.    Past Medical History:  Diagnosis Date   Anginal pain ([place])    Anxiety and depression    Breast cancer ([place])    Breast cancer ([place])    CAD (coronary artery disease)    S/P NSTEMI 4/06 EF 60%; Treated with Cypher DES to LAD and RCA with kissing balloon PCI diagonal; Treadmill myoview 10/09 for CP: Walked 7:06. mild ST-T-wave changes in recovery. EF 71%. Very mild reversible defect in base of the inferior wall --> medical rx.   Cancer ([place])    HISTORY OF SKIN CANCER   Cataract    Complication of anesthesia    " DIFFICULT TO WAKE UP "   Depression    Family history of breast cancer 04/01/2021   Family history of colon cancer 04/01/2021   Family history of malignant melanoma 04/01/2021   Glucose intolerance (impaired glucose tolerance)    History of echocardiogram    Echo 11/17: Vigorous LVF, EF 65-70, normal wall motion, borderline diastolic function, systolic bowing of mitral valve without prolapse, mild MR, trivial TR, PASP 20   History of medication noncompliance    Hyperlipidemia    Intolerate to multiple statins due to severe myalgias  Hypertension    Possible white-coat component   MI (myocardial infarction) ([place])    acute-s/p stent   Microhematuria    Pneumonia due to COVID-19 virus    Psoriasis    Rapid heart beat    Vitamin D deficiency     Past Surgical History:  Procedure Laterality Date   ABDOMINAL HYSTERECTOMY     and anterior repair   APPENDECTOMY     bladder tact     BREAST BIOPSY     BREAST LUMPECTOMY Right 05/21/2021   BREAST LUMPECTOMY WITH RADIOACTIVE SEED LOCALIZATION Left 05/21/2021   Procedure: LEFT BREAST LUMPECTOMY WITH RADIOACTIVE SEED LOCALIZATION;  Surgeon: Rolm Bookbinder, MD;  Location: [facility];  Service: General;  Laterality: Left;   CARDIAC CATHETERIZATION  02/28/2018   CATARACT EXTRACTION Bilateral 2017    Dr. Katy Fitch   CORONARY STENT INTERVENTION N/A 02/28/2018   Procedure: CORONARY STENT INTERVENTION;  Surgeon: Jettie Booze, MD;  Location: [facility];  Service: Cardiovascular;  Laterality: N/A;   CORONARY STENT PLACEMENT     CORONARY STENT PLACEMENT  04/02/019   EYE SURGERY Bilateral    cataract   HEMORRHOID SURGERY     LEFT HEART CATH AND CORONARY ANGIOGRAPHY N/A 02/28/2018   Procedure: LEFT HEART CATH AND CORONARY ANGIOGRAPHY;  Surgeon: Jettie Booze, MD;  Location: [facility];  Service: Cardiovascular;  Laterality: N/A;   MOHS SURGERY  07/2013   BCC.  [facility]   RECTAL PROLAPSE REPAIR  1976   TONSILLECTOMY AND ADENOIDECTOMY     ULTRASOUND GUIDANCE FOR VASCULAR ACCESS  02/28/2018   Procedure: Ultrasound Guidance For Vascular Access;  Surgeon: Jettie Booze, MD;  Location: [facility];  Service: Cardiovascular;;    Allergies  Allergen Reactions   Iodine Shortness Of Breath and Palpitations    Has had some SHOB and rapid heart rate in the past   Latex Other (See Comments)    discomfort   Other Other (See Comments)    Some foods b/c of preservatives    Statins Other (See Comments)    Intolerance to high dosages, myalgia    Family History  Problem Relation Age of Onset   Heart disease Father    Heart attack Father    Ulcers Father    Hypertension Mother    Melanoma Mother        mets? bottom of foot; dx 29s   Melanoma Daughter        metastatic; left arm; d. 48   Stroke Maternal Grandmother    Hypertension Maternal Grandmother    Stroke Maternal Grandfather    Renal Disease Maternal Grandfather        kidney removed   Hypertension Maternal Grandfather    Diabetes Paternal Grandmother    Breast cancer Sister 75       mets   Lung cancer Sister        dx 19s   Hypertension Brother    Asthma Brother    Depression Daughter        x2   Breast cancer Daughter 1   Colon cancer Son 56   Coronary artery disease Neg Hx      Social History   Socioeconomic History   Marital status: Widowed    Spouse name: Not on file   Number of children: 7   Years of education: Not on file   Highest education level: Not on file  Occupational History   Occupation: Works at home, Charity fundraiser,  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  Tobacco Use   Smoking status: Never   Smokeless tobacco: Never   Tobacco comments:    only a few times  Vaping Use   Vaping Use: Never used  Substance and Sexual Activity   Alcohol use: Yes    Alcohol/week: 4.0 standard drinks of alcohol    Types: 4 Glasses of wine per week   Drug use: No   Sexual activity: Not Currently    Birth control/protection: Abstinence, Surgical    Comment: TVH  Other Topics Concern   Not on file  Social History Narrative   Lives in [place] with her husband   Seven kids   Plays tennis   Very good card player   Social Determinants of Health   Financial Resource Strain: Low Risk  (02/02/2022)   Overall Financial Resource Strain (CARDIA)    Difficulty of Paying Living Expenses: Not hard at all  Food Insecurity: No Food Insecurity (02/02/2022)   Hunger Vital Sign    Worried About Running Out of Food in the Last Year: Never true    [place] in the Last Year: Never true  Transportation Needs: No Transportation Needs (02/02/2022)   PRAPARE - Hydrologist (Medical): No    Lack of Transportation (Non-Medical): No  Physical Activity: Inactive (02/02/2022)   Exercise Vital Sign    Days of Exercise per Week: 0 days    Minutes of Exercise per Session: 0 min  Stress: No Stress Concern Present (02/02/2022)   [place]rian    Feeling of Stress : Only a little  Social Connections: Moderately Integrated (09/17/2021)   Social Connection and Isolation Panel [NHANES]    Frequency of Communication with Friends and Family: More than three times a week    Frequency of Social Gatherings with Friends  and Family: More than three times a week    Attends Religious Services: More than 4 times per year    Active Member of [employer] or Organizations: Yes    Attends Archivist Meetings: More than 4 times per year    Marital Status: Widowed    There were no vitals filed for this visit. There is no height or weight on file to calculate BMI.  Wt Readings from Last 3 Encounters:  07/19/22 146 lb 6.4 oz (66.4 kg)  05/21/22 148 lb 4 oz (67.2 kg)  03/24/22 145 lb 8 oz (66 kg)    Physical Exam  ASSESSMENT AND PLAN: Erika Brown was here today annual physical examination.  No orders of the defined types were placed in this encounter.   There are no diagnoses linked to this encounter.  There are no diagnoses linked to this encounter.  No follow-ups on file.  Betty G. Martinique, MD  [facility]. [place] office.

## 2022-12-14 ENCOUNTER — Encounter: Payer: Medicare HMO | Admitting: Family Medicine

## 2022-12-14 DIAGNOSIS — Z0000 Encounter for general adult medical examination without abnormal findings: Secondary | ICD-10-CM

## 2023-01-03 NOTE — Progress Notes (Unsigned)
HPI: Erika Brown is a 82 y.o. female, who is here today for her routine physical.  Last CPE: AWV 02/02/22  Regular exercise 3 or more time per week: *** Following a healthy diet: ***  Chronic medical problems: ***  Immunization History  Administered Date(s) Administered   PFIZER(Purple Top)SARS-COV-2 Vaccination 06/25/2020   Pneumococcal Conjugate-13 11/10/2015   Pneumococcal Polysaccharide-23 07/18/2006, 11/07/2019   Tdap 03/23/2010, 03/24/2020   Health Maintenance  Topic Date Due   Zoster Vaccines- Shingrix (1 of 2) Never done   COVID-19 Vaccine (2 - Pfizer risk series) 07/16/2020   INFLUENZA VACCINE  02/27/2023 (Originally 06/29/2022)   DTaP/Tdap/Td (3 - Td or Tdap) 03/24/2030   Pneumonia Vaccine 72+ Years old  Completed   HPV VACCINES  Aged Out   DEXA SCAN  Discontinued    She has *** concerns today.  Review of Systems  Current Outpatient Medications on File Prior to Visit  Medication Sig Dispense Refill   amLODipine (NORVASC) 5 MG tablet TAKE 1 TABLET EVERY DAY 90 tablet 1   aspirin 81 MG EC tablet Take 81 mg by mouth daily.     Biotin 5000 MCG TABS Take 1 tablet by mouth daily.     Cholecalciferol (VITAMIN D3) 125 MCG (5000 UT) CAPS Take 5,000 Units by mouth daily.     clopidogrel (PLAVIX) 75 MG tablet TAKE 1 TABLET EVERY DAY 90 tablet 1   ezetimibe (ZETIA) 10 MG tablet TAKE 1/2 TABLET EVERY DAY 45 tablet 1   irbesartan-hydrochlorothiazide (AVALIDE) 300-12.5 MG tablet TAKE 1 TABLET EVERY DAY 90 tablet 1   metoprolol succinate (TOPROL-XL) 50 MG 24 hr tablet Take 1 tablet (50 mg total) by mouth daily. Take with or immediately following a meal. 90 tablet 2   nitroGLYCERIN (NITROSTAT) 0.4 MG SL tablet Place 1 tablet (0.4 mg total) under the tongue every 5 (five) minutes as needed for chest pain. 75 tablet 1   potassium chloride SA (KLOR-CON M) 20 MEQ tablet TAKE 1 TABLET EVERY DAY 90 tablet 1   vitamin C (ASCORBIC ACID) 500 MG tablet Take 500 mg by mouth daily.      zinc gluconate 50 MG tablet Take 50 mg by mouth daily.     No current facility-administered medications on file prior to visit.    Past Medical History:  Diagnosis Date   Anginal pain ([place])    Anxiety and depression    Breast cancer ([place])    Breast cancer ([place])    CAD (coronary artery disease)    S/P NSTEMI 4/06 EF 60%; Treated with Cypher DES to LAD and RCA with kissing balloon PCI diagonal; Treadmill myoview 10/09 for CP: Walked 7:06. mild ST-T-wave changes in recovery. EF 71%. Very mild reversible defect in base of the inferior wall --> medical rx.   Cancer ([place])    HISTORY OF SKIN CANCER   Cataract    Complication of anesthesia    " DIFFICULT TO WAKE UP "   Depression    Family history of breast cancer 04/01/2021   Family history of colon cancer 04/01/2021   Family history of malignant melanoma 04/01/2021   Glucose intolerance (impaired glucose tolerance)    History of echocardiogram    Echo 11/17: Vigorous LVF, EF 65-70, normal wall motion, borderline diastolic function, systolic bowing of mitral valve without prolapse, mild MR, trivial TR, PASP 20   History of medication noncompliance    Hyperlipidemia    Intolerate to multiple statins due to severe myalgias  Hypertension    Possible white-coat component   MI (myocardial infarction) ([place])    acute-s/p stent   Microhematuria    Pneumonia due to COVID-19 virus    Psoriasis    Rapid heart beat    Vitamin D deficiency     Past Surgical History:  Procedure Laterality Date   ABDOMINAL HYSTERECTOMY     and anterior repair   APPENDECTOMY     bladder tact     BREAST BIOPSY     BREAST LUMPECTOMY Right 05/21/2021   BREAST LUMPECTOMY WITH RADIOACTIVE SEED LOCALIZATION Left 05/21/2021   Procedure: LEFT BREAST LUMPECTOMY WITH RADIOACTIVE SEED LOCALIZATION;  Surgeon: Rolm Bookbinder, MD;  Location: [facility];  Service: General;  Laterality: Left;   CARDIAC CATHETERIZATION  02/28/2018   CATARACT EXTRACTION Bilateral 2017    Dr. Katy Fitch   CORONARY STENT INTERVENTION N/A 02/28/2018   Procedure: CORONARY STENT INTERVENTION;  Surgeon: Jettie Booze, MD;  Location: [facility];  Service: Cardiovascular;  Laterality: N/A;   CORONARY STENT PLACEMENT     CORONARY STENT PLACEMENT  04/02/019   EYE SURGERY Bilateral    cataract   HEMORRHOID SURGERY     LEFT HEART CATH AND CORONARY ANGIOGRAPHY N/A 02/28/2018   Procedure: LEFT HEART CATH AND CORONARY ANGIOGRAPHY;  Surgeon: Jettie Booze, MD;  Location: [facility];  Service: Cardiovascular;  Laterality: N/A;   MOHS SURGERY  07/2013   BCC.  [facility]   RECTAL PROLAPSE REPAIR  1976   TONSILLECTOMY AND ADENOIDECTOMY     ULTRASOUND GUIDANCE FOR VASCULAR ACCESS  02/28/2018   Procedure: Ultrasound Guidance For Vascular Access;  Surgeon: Jettie Booze, MD;  Location: [facility];  Service: Cardiovascular;;    Allergies  Allergen Reactions   Iodine Shortness Of Breath and Palpitations    Has had some SHOB and rapid heart rate in the past   Latex Other (See Comments)    discomfort   Other Other (See Comments)    Some foods b/c of preservatives    Statins Other (See Comments)    Intolerance to high dosages, myalgia    Family History  Problem Relation Age of Onset   Heart disease Father    Heart attack Father    Ulcers Father    Hypertension Mother    Melanoma Mother        mets? bottom of foot; dx 29s   Melanoma Daughter        metastatic; left arm; d. 48   Stroke Maternal Grandmother    Hypertension Maternal Grandmother    Stroke Maternal Grandfather    Renal Disease Maternal Grandfather        kidney removed   Hypertension Maternal Grandfather    Diabetes Paternal Grandmother    Breast cancer Sister 75       mets   Lung cancer Sister        dx 19s   Hypertension Brother    Asthma Brother    Depression Daughter        x2   Breast cancer Daughter 1   Colon cancer Son 56   Coronary artery disease Neg Hx      Social History   Socioeconomic History   Marital status: Widowed    Spouse name: Not on file   Number of children: 7   Years of education: Not on file   Highest education level: Not on file  Occupational History   Occupation: Works at home, Charity fundraiser,  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  Tobacco Use   Smoking status: Never   Smokeless tobacco: Never   Tobacco comments:    only a few times  Vaping Use   Vaping Use: Never used  Substance and Sexual Activity   Alcohol use: Yes    Alcohol/week: 4.0 standard drinks of alcohol    Types: 4 Glasses of wine per week   Drug use: No   Sexual activity: Not Currently    Birth control/protection: Abstinence, Surgical    Comment: TVH  Other Topics Concern   Not on file  Social History Narrative   Lives in [place] with her husband   Seven kids   Plays tennis   Very good card player   Social Determinants of Health   Financial Resource Strain: Low Risk  (02/02/2022)   Overall Financial Resource Strain (CARDIA)    Difficulty of Paying Living Expenses: Not hard at all  Food Insecurity: No Food Insecurity (02/02/2022)   Hunger Vital Sign    Worried About Running Out of Food in the Last Year: Never true    [place] in the Last Year: Never true  Transportation Needs: No Transportation Needs (02/02/2022)   PRAPARE - Hydrologist (Medical): No    Lack of Transportation (Non-Medical): No  Physical Activity: Inactive (02/02/2022)   Exercise Vital Sign    Days of Exercise per Week: 0 days    Minutes of Exercise per Session: 0 min  Stress: No Stress Concern Present (02/02/2022)   [place]rian    Feeling of Stress : Only a little  Social Connections: Moderately Integrated (09/17/2021)   Social Connection and Isolation Panel [NHANES]    Frequency of Communication with Friends and Family: More than three times a week    Frequency of Social Gatherings with Friends  and Family: More than three times a week    Attends Religious Services: More than 4 times per year    Active Member of [employer] or Organizations: Yes    Attends Archivist Meetings: More than 4 times per year    Marital Status: Widowed    There were no vitals filed for this visit. There is no height or weight on file to calculate BMI.  Wt Readings from Last 3 Encounters:  07/19/22 146 lb 6.4 oz (66.4 kg)  05/21/22 148 lb 4 oz (67.2 kg)  03/24/22 145 lb 8 oz (66 kg)    Physical Exam  ASSESSMENT AND PLAN: Erika Brown was here today annual physical examination.  No orders of the defined types were placed in this encounter.   There are no diagnoses linked to this encounter.  There are no diagnoses linked to this encounter.  No follow-ups on file.  Chance Munter G. Martinique, MD  [facility]. [place] office.

## 2023-01-04 ENCOUNTER — Ambulatory Visit (INDEPENDENT_AMBULATORY_CARE_PROVIDER_SITE_OTHER): Payer: HMO | Admitting: Family Medicine

## 2023-01-04 ENCOUNTER — Encounter: Payer: Self-pay | Admitting: Family Medicine

## 2023-01-04 VITALS — BP 128/70 | HR 76 | Temp 98.5°F | Resp 16 | Ht 64.5 in | Wt 146.1 lb

## 2023-01-04 DIAGNOSIS — I119 Hypertensive heart disease without heart failure: Secondary | ICD-10-CM | POA: Diagnosis not present

## 2023-01-04 DIAGNOSIS — C50412 Malignant neoplasm of upper-outer quadrant of left female breast: Secondary | ICD-10-CM | POA: Diagnosis not present

## 2023-01-04 DIAGNOSIS — M79672 Pain in left foot: Secondary | ICD-10-CM

## 2023-01-04 DIAGNOSIS — E785 Hyperlipidemia, unspecified: Secondary | ICD-10-CM

## 2023-01-04 DIAGNOSIS — E876 Hypokalemia: Secondary | ICD-10-CM

## 2023-01-04 DIAGNOSIS — E559 Vitamin D deficiency, unspecified: Secondary | ICD-10-CM | POA: Diagnosis not present

## 2023-01-04 DIAGNOSIS — Z170 Estrogen receptor positive status [ER+]: Secondary | ICD-10-CM

## 2023-01-04 DIAGNOSIS — Z0000 Encounter for general adult medical examination without abnormal findings: Secondary | ICD-10-CM | POA: Insufficient documentation

## 2023-01-04 DIAGNOSIS — M545 Low back pain, unspecified: Secondary | ICD-10-CM

## 2023-01-04 LAB — LIPID PANEL
Cholesterol: 223 mg/dL — ABNORMAL HIGH (ref 0–200)
HDL: 81.9 mg/dL (ref >39.00)
LDL Cholesterol: 127 mg/dL — ABNORMAL HIGH (ref 0–99)
NonHDL: 141.35
Total CHOL/HDL Ratio: 3
Triglycerides: 74 mg/dL (ref 0.0–149.0)
VLDL: 14.8 mg/dL (ref 0.0–40.0)

## 2023-01-04 LAB — COMPREHENSIVE METABOLIC PANEL
ALT: 16 U/L (ref 0–35)
AST: 16 U/L (ref 0–37)
Albumin: 4.4 g/dL (ref 3.5–5.2)
Alkaline Phosphatase: 74 U/L (ref 39–117)
BUN: 18 mg/dL (ref 6–23)
CO2: 26 mEq/L (ref 19–32)
Calcium: 10.2 mg/dL (ref 8.4–10.5)
Chloride: 101 mEq/L (ref 96–112)
Creatinine, Ser: 0.84 mg/dL (ref 0.40–1.20)
GFR: 64.43 mL/min (ref >60.00)
Glucose, Bld: 100 mg/dL — ABNORMAL HIGH (ref 70–99)
Potassium: 3.8 mEq/L (ref 3.5–5.1)
Sodium: 139 mEq/L (ref 135–145)
Total Bilirubin: 0.7 mg/dL (ref 0.2–1.2)
Total Protein: 7.4 g/dL (ref 6.0–8.3)

## 2023-01-04 LAB — VITAMIN D 25 HYDROXY (VIT D DEFICIENCY, FRACTURES): VITD: 57.1 ng/mL (ref 30.00–100.00)

## 2023-01-04 NOTE — Assessment & Plan Note (Signed)
BP adequately controlled. Continue Amlodipine,Irbesartan-HCTZ,and metoprolol same dose.

## 2023-01-04 NOTE — Assessment & Plan Note (Addendum)
Left breast cancer Dx'ed in 03/2021, s/p left lumpectomy in 04/2021. Last appt with oncologist in 08/2021. She is not on antiestrogen therapy. Has appt with surgeon 01/20/23.

## 2023-01-04 NOTE — Patient Instructions (Addendum)
A few things to remember from today's visit:  Routine general medical examination at a health care facility  Hypertension with heart disease - Plan: Comprehensive metabolic panel  Hyperlipidemia, unspecified hyperlipidemia type - Plan: Comprehensive metabolic panel, Lipid panel  Vitamin D deficiency, unspecified - Plan: VITAMIN D 25 Hydroxy (Vit-D Deficiency, Fractures)  Hypokalemia  Malignant neoplasm of upper-outer quadrant of left breast in female, estrogen receptor positive ([place]), Chronic  Acute left-sided low back pain without sciatica  No changes in medication today.  Preventive Care 89 Years and Older, Female Preventive care refers to lifestyle choices and visits with your health care provider that can promote health and wellness. Preventive care visits are also called wellness exams. What can I expect for my preventive care visit? Counseling Your health care provider may ask you questions about your: Medical history, including: Past medical problems. Family medical history. Pregnancy and menstrual history. History of falls. Current health, including: Memory and ability to understand (cognition). Emotional well-being. Home life and relationship well-being. Sexual activity and sexual health. Lifestyle, including: Alcohol, nicotine or tobacco, and drug use. Access to firearms. Diet, exercise, and sleep habits. Work and work Statistician. Sunscreen use. Safety issues such as seatbelt and bike helmet use. Physical exam Your health care provider will check your: Height and weight. These may be used to calculate your BMI (body mass index). BMI is a measurement that tells if you are at a healthy weight. Waist circumference. This measures the distance around your waistline. This measurement also tells if you are at a healthy weight and may help predict your risk of certain diseases, such as type 2 diabetes and high blood pressure. Heart rate and blood pressure. Body  temperature. Skin for abnormal spots. What immunizations do I need?  Vaccines are usually given at various ages, according to a schedule. Your health care provider will recommend vaccines for you based on your age, medical history, and lifestyle or other factors, such as travel or where you work. What tests do I need? Screening Your health care provider may recommend screening tests for certain conditions. This may include: Lipid and cholesterol levels. Hepatitis C test. Hepatitis B test. HIV (human immunodeficiency virus) test. STI (sexually transmitted infection) testing, if you are at risk. Lung cancer screening. Colorectal cancer screening. Diabetes screening. This is done by checking your blood sugar (glucose) after you have not eaten for a while (fasting). Mammogram. Talk with your health care provider about how often you should have regular mammograms. BRCA-related cancer screening. This may be done if you have a family history of breast, ovarian, tubal, or peritoneal cancers. Bone density scan. This is done to screen for osteoporosis. Talk with your health care provider about your test results, treatment options, and if necessary, the need for more tests. Follow these instructions at home: Eating and drinking  Eat a diet that includes fresh fruits and vegetables, whole grains, lean protein, and low-fat dairy products. Limit your intake of foods with high amounts of sugar, saturated fats, and salt. Take vitamin and mineral supplements as recommended by your health care provider. Do not drink alcohol if your health care provider tells you not to drink. If you drink alcohol: Limit how much you have to 0-1 drink a day. Know how much alcohol is in your drink. In the [place] one drink equals one 12 oz bottle of beer (355 mL), one 5 oz glass of wine (148 mL), or one 1 oz glass of hard liquor (44 mL). Lifestyle Brush your teeth  every morning and night with fluoride toothpaste. Floss one  time each day. Exercise for at least 30 minutes 5 or more days each week. Do not use any products that contain nicotine or tobacco. These products include cigarettes, chewing tobacco, and vaping devices, such as e-cigarettes. If you need help quitting, ask your health care provider. Do not use drugs. If you are sexually active, practice safe sex. Use a condom or other form of protection in order to prevent STIs. Take aspirin only as told by your health care provider. Make sure that you understand how much to take and what form to take. Work with your health care provider to find out whether it is safe and beneficial for you to take aspirin daily. Ask your health care provider if you need to take a cholesterol-lowering medicine (statin). Find healthy ways to manage stress, such as: Meditation, yoga, or listening to music. Journaling. Talking to a trusted person. Spending time with friends and family. Minimize exposure to UV radiation to reduce your risk of skin cancer. Safety Always wear your seat belt while driving or riding in a vehicle. Do not drive: If you have been drinking alcohol. Do not ride with someone who has been drinking. When you are tired or distracted. While texting. If you have been using any mind-altering substances or drugs. Wear a helmet and other protective equipment during sports activities. If you have firearms in your house, make sure you follow all gun safety procedures. What's next? Visit your health care provider once a year for an annual wellness visit. Ask your health care provider how often you should have your eyes and teeth checked. Stay up to date on all vaccines. This information is not intended to replace advice given to you by your health care provider. Make sure you discuss any questions you have with your health care provider. Document Revised: 05/13/2021 Document Reviewed: 05/13/2021 Elsevier Patient Education  [place].  If you need  refills for medications you take chronically, please call your pharmacy. Do not use My Chart to request refills or for acute issues that need immediate attention. If you send a my chart message, it may take a few days to be addressed, specially if I am not in the office.  Please be sure medication list is accurate. If a new problem present, please set up appointment sooner than planned today.

## 2023-01-04 NOTE — Assessment & Plan Note (Signed)
K+ 4.2 in 04/2022. Continue KLOR 20 meq daily.

## 2023-01-04 NOTE — Assessment & Plan Note (Signed)
Continue vit D 5000 U daily. Further recommendations according to 25 OH vit D result.

## 2023-01-04 NOTE — Assessment & Plan Note (Signed)
We discussed the importance of regular physical activity and healthy diet for prevention of chronic illness and/or complications. Preventive guidelines reviewed. Vaccination up today date. Ca++ and vit D supplementation to continue. Next CPE in a year.

## 2023-01-07 ENCOUNTER — Telehealth: Payer: Self-pay

## 2023-01-07 NOTE — Telephone Encounter (Signed)
Patient called back in after reviewing the side effects of the Bempedoic acid. She does not want to take it due to her allergies. She will continue the Zetia 1/2 tab (she tried the full tablet, but had aches) and work on exercising.   Back pain is much better as of today.

## 2023-01-31 ENCOUNTER — Telehealth: Payer: Self-pay | Admitting: Family Medicine

## 2023-01-31 NOTE — Telephone Encounter (Signed)
Contacted Erika Brown to schedule their annual wellness visit. Patient declined to schedule AWV at this time.  Erika Brown AWV direct phone # [phone]   Spoke to patient to schedule AWV  She stated healthteam coming to home to do AWV

## 2023-02-02 NOTE — Progress Notes (Unsigned)
ACUTE VISIT Chief Complaint  Patient presents with  . Leg Pain    Left leg, happens at night time, feeling some soreness during the day, not able to do as much walking because it doesn't feel right; did feel like she may have had some numbness in two toes the other night.    HPI: [name] is a 82 y.o. female with PMHx significant for for CAD,HTN, Hyperlipidemia, Vit D Deficiency, and breast cancer  here today complaining of *** Leg Pain  The incident occurred more than 1 week ago. There was no injury mechanism. The pain is at a severity of 2/10. The pain is mild. The pain has been Intermittent since onset. Associated symptoms include numbness and tingling. Pertinent negatives include no inability to bear weight, loss of motion, loss of sensation or muscle weakness. She has tried nothing for the symptoms.   No results found for: "VITAMINB12" Lab Results  Component Value Date   CREATININE 0.84 01/04/2023   BUN 18 01/04/2023   NA 139 01/04/2023   K 3.8 01/04/2023   CL 101 01/04/2023   CO2 26 01/04/2023   Review of Systems  Neurological:  Positive for tingling and numbness.   See other pertinent positives and negatives in HPI.  Current Outpatient Medications on File Prior to Visit  Medication Sig Dispense Refill  . amLODipine (NORVASC) 5 MG tablet TAKE 1 TABLET EVERY DAY 90 tablet 1  . aspirin 81 MG EC tablet Take 81 mg by mouth daily.    . Biotin 5000 MCG TABS Take 1 tablet by mouth daily.    . Cholecalciferol (VITAMIN D3) 125 MCG (5000 UT) CAPS Take 5,000 Units by mouth daily.    . clopidogrel (PLAVIX) 75 MG tablet TAKE 1 TABLET EVERY DAY 90 tablet 1  . irbesartan-hydrochlorothiazide (AVALIDE) 300-12.5 MG tablet TAKE 1 TABLET EVERY DAY 90 tablet 1  . metoprolol succinate (TOPROL-XL) 50 MG 24 hr tablet Take 1 tablet (50 mg total) by mouth daily. Take with or immediately following a meal. 90 tablet 2  . nitroGLYCERIN (NITROSTAT) 0.4 MG SL tablet Place 1 tablet (0.4 mg  total) under the tongue every 5 (five) minutes as needed for chest pain. 75 tablet 1  . potassium chloride SA (KLOR-CON M) 20 MEQ tablet TAKE 1 TABLET EVERY DAY 90 tablet 1  . vitamin C (ASCORBIC ACID) 500 MG tablet Take 500 mg by mouth daily.    [name] zinc gluconate 50 MG tablet Take 50 mg by mouth daily.     No current facility-administered medications on file prior to visit.    Past Medical History:  Diagnosis Date  . Anginal pain ([place])   . Anxiety and depression   . Breast cancer ([place])   . Breast cancer ([place])   . CAD (coronary artery disease)    S/P NSTEMI 4/06 EF 60%; Treated with Cypher DES to LAD and RCA with kissing balloon PCI diagonal; Treadmill myoview 10/09 for CP: Walked 7:06. mild ST-T-wave changes in recovery. EF 71%. Very mild reversible defect in base of the inferior wall --> medical rx.  . Cancer ([place])    HISTORY OF SKIN CANCER  . Cataract   . Complication of anesthesia    " DIFFICULT TO WAKE UP "  . Depression   . Family history of breast cancer 04/01/2021  . Family history of colon cancer 04/01/2021  . Family history of malignant melanoma 04/01/2021  . Glucose intolerance (impaired glucose tolerance)   . History of echocardiogram  Echo 11/17: Vigorous LVF, EF 65-70, normal wall motion, borderline diastolic function, systolic bowing of mitral valve without prolapse, mild MR, trivial TR, PASP 20  . History of medication noncompliance   . Hyperlipidemia    Intolerate to multiple statins due to severe myalgias  . Hypertension    Possible white-coat component  . MI (myocardial infarction) ([place])    acute-s/p stent  . Microhematuria   . Pneumonia due to COVID-19 virus   . Psoriasis   . Rapid heart beat   . Vitamin D deficiency    Allergies  Allergen Reactions  . Iodine Shortness Of Breath and Palpitations    Has had some SHOB and rapid heart rate in the past  . Latex Other (See Comments)    discomfort  . Other Other (See Comments)    Some foods b/c of  preservatives   . Statins Other (See Comments)    Intolerance to high dosages, myalgia    Social History   Socioeconomic History  . Marital status: Widowed    Spouse name: Not on file  . Number of children: 7  . Years of education: Not on file  . Highest education level: Not on file  Occupational History  . Occupation: Works at home, Charity fundraiser, [place] Use  . Smoking status: Never  . Smokeless tobacco: Never  . Tobacco comments:    only a few times  Vaping Use  . Vaping Use: Never used  Substance and Sexual Activity  . Alcohol use: Yes    Alcohol/week: 4.0 standard drinks of alcohol    Types: 4 Glasses of wine per week  . Drug use: No  . Sexual activity: Not Currently    Birth control/protection: Abstinence, Surgical    Comment: TVH  Other Topics Concern  . Not on file  Social History Narrative   Lives in [place] with her husband   Seven kids   Plays tennis   Very good card player   Social Determinants of Health   Financial Resource Strain: Low Risk  (02/02/2022)   Overall Financial Resource Strain (CARDIA)   . Difficulty of Paying Living Expenses: Not hard at all  Food Insecurity: No Food Insecurity (02/02/2022)   Hunger Vital Sign   . Worried About Charity fundraiser in the Last Year: Never true   . Ran Out of Food in the Last Year: Never true  Transportation Needs: No Transportation Needs (02/02/2022)   PRAPARE - Transportation   . Lack of Transportation (Medical): No   . Lack of Transportation (Non-Medical): No  Physical Activity: Inactive (02/02/2022)   Exercise Vital Sign   . Days of Exercise per Week: 0 days   . Minutes of Exercise per Session: 0 min  Stress: No Stress Concern Present (02/02/2022)   [place]   . Feeling of Stress : Only a little  Social Connections: Moderately Integrated (09/17/2021)   Social Connection and Isolation Panel [NHANES]   . Frequency of  Communication with Friends and Family: More than three times a week   . Frequency of Social Gatherings with Friends and Family: More than three times a week   . Attends Religious Services: More than 4 times per year   . Active Member of Clubs or Organizations: Yes   . Attends Archivist Meetings: More than 4 times per year   . Marital Status: Widowed    Vitals:   02/04/23 0824  BP: 126/70  Pulse: 76  Resp: 16  Temp: 98.7 F (37.1 C)  SpO2: 98%   Body mass index is 24.86 kg/m.  Physical Exam Vitals and nursing note reviewed.  Constitutional:      General: She is not in acute distress.    Appearance: She is well-developed.  HENT:     Head: Normocephalic and atraumatic.  Eyes:     Conjunctiva/sclera: Conjunctivae normal.  Cardiovascular:     Rate and Rhythm: Normal rate and regular rhythm.     Pulses:          Dorsalis pedis pulses are 2+ on the right side and 2+ on the left side.       Posterior tibial pulses are 2+ on the right side and 2+ on the left side.     Heart sounds: No murmur heard.    Comments: No calves tenderness.*** Pulmonary:     Effort: Pulmonary effort is normal. No respiratory distress.     Breath sounds: Normal breath sounds.  Abdominal:     Palpations: Abdomen is soft. There is no hepatomegaly or mass.     Tenderness: There is no abdominal tenderness.  Musculoskeletal:     Lumbar back: Tenderness present. No bony tenderness. Negative right straight leg raise test and negative left straight leg raise test.       Back:     Right hip: No tenderness.     Left hip: No tenderness.     Right lower leg: No edema.     Left lower leg: No edema.  Skin:    General: Skin is warm.     Findings: Ecchymosis present. No erythema or rash.       Neurological:     General: No focal deficit present.     Mental Status: She is alert and oriented to person, place, and time.     Cranial Nerves: No cranial nerve deficit.     Gait: Gait normal.   Psychiatric:     Comments: Well groomed, good eye contact.  ASSESSMENT AND PLAN: Erika Brown was seen today for leg pain.  Diagnoses and all orders for this visit:  Numbness and tingling of lower extremity -     TSH; Future -     Vitamin B12; Future  Hypokalemia -     Basic metabolic panel; Future  Pain in both lower extremities  Nocturnal leg cramps -     CK; Future    Orders Placed This Encounter  Procedures  . Basic metabolic panel  . CK  . TSH  . Vitamin B12    No problem-specific Assessment & Plan notes found for this encounter.  Return if symptoms worsen or fail to improve, for keep next appointment.  Erika Brown G. [place], MD  [facility]. [place] office.

## 2023-02-04 ENCOUNTER — Encounter: Payer: Self-pay | Admitting: Family Medicine

## 2023-02-04 ENCOUNTER — Ambulatory Visit (INDEPENDENT_AMBULATORY_CARE_PROVIDER_SITE_OTHER): Payer: HMO | Admitting: Family Medicine

## 2023-02-04 VITALS — BP 126/70 | HR 76 | Temp 98.7°F | Resp 16 | Ht 64.5 in | Wt 147.1 lb

## 2023-02-04 DIAGNOSIS — M79604 Pain in right leg: Secondary | ICD-10-CM

## 2023-02-04 DIAGNOSIS — E876 Hypokalemia: Secondary | ICD-10-CM | POA: Diagnosis not present

## 2023-02-04 DIAGNOSIS — R200 Anesthesia of skin: Secondary | ICD-10-CM

## 2023-02-04 DIAGNOSIS — G4762 Sleep related leg cramps: Secondary | ICD-10-CM | POA: Diagnosis not present

## 2023-02-04 DIAGNOSIS — R202 Paresthesia of skin: Secondary | ICD-10-CM | POA: Diagnosis not present

## 2023-02-04 DIAGNOSIS — M79605 Pain in left leg: Secondary | ICD-10-CM

## 2023-02-04 LAB — VITAMIN B12: Vitamin B-12: 293 pg/mL (ref 211–911)

## 2023-02-04 LAB — BASIC METABOLIC PANEL
BUN: 21 mg/dL (ref 6–23)
CO2: 29 mEq/L (ref 19–32)
Calcium: 10.1 mg/dL (ref 8.4–10.5)
Chloride: 103 mEq/L (ref 96–112)
Creatinine, Ser: 0.81 mg/dL (ref 0.40–1.20)
GFR: 67.26 mL/min (ref >60.00)
Glucose, Bld: 94 mg/dL (ref 70–99)
Potassium: 3.8 mEq/L (ref 3.5–5.1)
Sodium: 140 mEq/L (ref 135–145)

## 2023-02-04 LAB — CK: Total CK: 57 U/L (ref 7–177)

## 2023-02-04 LAB — TSH: TSH: 1.33 u[IU]/mL (ref 0.35–5.50)

## 2023-02-04 NOTE — Patient Instructions (Addendum)
A few things to remember from today's visit:  Numbness and tingling of lower extremity - Plan: TSH, Vitamin B12  Hypokalemia - Plan: Basic metabolic panel  Pain in both lower extremities  Nocturnal leg cramps - Plan: CK  ? Related to back issues. For now continue monitoring, if worse we can consider imaging and/or treatment. Topical vick vapor rub may help.  If you need refills for medications you take chronically, please call your pharmacy. Do not use My Chart to request refills or for acute issues that need immediate attention. If you send a my chart message, it may take a few days to be addressed, specially if I am not in the office.  Please be sure medication list is accurate. If a new problem present, please set up appointment sooner than planned today.

## 2023-02-07 ENCOUNTER — Telehealth: Payer: Self-pay | Admitting: Family Medicine

## 2023-02-07 NOTE — Telephone Encounter (Signed)
Called patient to schedule Medicare Annual Wellness Visit (AWV). Left message for patient to call back and schedule Medicare Annual Wellness Visit (AWV).  Last date of AWV: 02/02/22  Please schedule an appointment at any time with [facility] or hannah kim  If any questions, please contact me at [phone].  Thank you ,  Barkley Boards AWV direct phone # [phone]   Returned patients call from message  02/02/23

## 2023-02-08 ENCOUNTER — Ambulatory Visit: Payer: Medicare HMO

## 2023-02-21 ENCOUNTER — Other Ambulatory Visit: Payer: Self-pay | Admitting: Family Medicine

## 2023-02-21 DIAGNOSIS — Z1231 Encounter for screening mammogram for malignant neoplasm of breast: Secondary | ICD-10-CM

## 2023-02-21 DIAGNOSIS — R928 Other abnormal and inconclusive findings on diagnostic imaging of breast: Secondary | ICD-10-CM

## 2023-02-21 NOTE — Progress Notes (Unsigned)
Cardiology Office Note   Date:  02/22/2023   ID:  Erika, Brown [birth year], MRN [id number]  PCP:  Brown, Erika G, MD    No chief complaint on file.  CAD  Wt Readings from Last 3 Encounters:  02/22/23 144 lb 12.8 oz (65.7 kg)  02/04/23 147 lb 2 oz (66.7 kg)  01/04/23 146 lb 2 oz (66.3 kg)       History of Present Illness: Erika Brown is a 82 y.o. female   with CAD who is  aformer patient of Dr. Saunders Revel with coronary artery disease status post Cypher DES to the LAD and Cypher DES to the RCA as well as balloon angioplasty of the diagonal in 03-08-05 in the setting of non-ST elevation myocardial infarction, hypertension, hyperlipidemia.  Most recently, she underwent cardiac catheterization in April 2019 after an abnormal stress test.  This demonstrated in-stent restenosis in the LAD as well as severe stenosis in the LCx and RCA.  She underwent multivessel stenting with a DES to the proximal-mid LAD, DES to the mid LCx and DES to the proximal-mid RCA.  Last seen by Dr. Saunders Revel in July 2019.  At that time, she complained of some palpitations and her beta-blocker dose was adjusted.  They also discussed +/- ezetimibe therapy.  She has intolerance to multiple statins and is not inclined to try PCSK-9 inhibitor therapy.   She had three vessel stenting in April 2019.  After March 08, 2018, she started following up with me.   SHe was trying to eat healthier.  She was decreasing meat intake.  She started Zetia in 09-27-23.       Her husband died from [place] in early 03/08/20.  He was diagnosed with COVID in early Jan.  She got COVID from him at a nursing home. She was hospitalized for 4 days.  She received steroids, remdesivir.    In Feb 2021, it was noted: "She feels that she is having nosebleeds.  HR seems to be higher in general. Even now, while she is talking, HR is 103. Using oxygen at night, 2L. "   I recommended a humidifier to help.    In 2022-03-08: "Son has long Lafe.     She was diagnosed with Breast  cancer. Has been treated.  Following with Dr. Lindi Adie.     Likes to garden- still works in the yard even in the [employer].   She does some more exercising- Cardio-drumming.  Sessions are 30 minutes. "  Stopped cardiodrumming.  Had a low back pain down the leg.  Limited Walking.    Denies : Chest pain. Dizziness. Leg edema. Nitroglycerin use. Orthopnea. Palpitations. Paroxysmal nocturnal dyspnea. Shortness of breath. Syncope.      Past Medical History:  Diagnosis Date   Anginal pain ([place])    Anxiety and depression    Breast cancer ([place])    Breast cancer ([place])    CAD (coronary artery disease)    S/P NSTEMI 4/06 EF 60%; Treated with Cypher DES to LAD and RCA with kissing balloon PCI diagonal; Treadmill myoview 10/09 for CP: Walked 7:06. mild ST-T-wave changes in recovery. EF 71%. Very mild reversible defect in base of the inferior wall --> medical rx.   Cancer ([place])    HISTORY OF SKIN CANCER   Cataract    Complication of anesthesia    " DIFFICULT TO WAKE UP "   Depression    Family history of breast cancer 04/01/2021   Family history of colon  cancer 04/01/2021   Family history of malignant melanoma 04/01/2021   Glucose intolerance (impaired glucose tolerance)    History of echocardiogram    Echo 11/17: Vigorous LVF, EF 65-70, normal wall motion, borderline diastolic function, systolic bowing of mitral valve without prolapse, mild MR, trivial TR, PASP 20   History of medication noncompliance    Hyperlipidemia    Intolerate to multiple statins due to severe myalgias   Hypertension    Possible white-coat component   MI (myocardial infarction) ([place])    acute-s/p stent   Microhematuria    Pneumonia due to COVID-19 virus    Psoriasis    Rapid heart beat    Vitamin D deficiency     Past Surgical History:  Procedure Laterality Date   ABDOMINAL HYSTERECTOMY     and anterior repair   APPENDECTOMY     bladder tact     BREAST BIOPSY     BREAST LUMPECTOMY Right 05/21/2021    BREAST LUMPECTOMY WITH RADIOACTIVE SEED LOCALIZATION Left 05/21/2021   Procedure: LEFT BREAST LUMPECTOMY WITH RADIOACTIVE SEED LOCALIZATION;  Surgeon: Rolm Bookbinder, MD;  Location: [facility];  Service: General;  Laterality: Left;   CARDIAC CATHETERIZATION  02/28/2018   CATARACT EXTRACTION Bilateral 2017   Dr. Katy Fitch   CORONARY STENT INTERVENTION N/A 02/28/2018   Procedure: CORONARY STENT INTERVENTION;  Surgeon: Jettie Booze, MD;  Location: [facility];  Service: Cardiovascular;  Laterality: N/A;   CORONARY STENT PLACEMENT     CORONARY STENT PLACEMENT  04/02/019   EYE SURGERY Bilateral    cataract   HEMORRHOID SURGERY     LEFT HEART CATH AND CORONARY ANGIOGRAPHY N/A 02/28/2018   Procedure: LEFT HEART CATH AND CORONARY ANGIOGRAPHY;  Surgeon: Jettie Booze, MD;  Location: [facility];  Service: Cardiovascular;  Laterality: N/A;   MOHS SURGERY  07/2013   BCC.  [facility]   RECTAL PROLAPSE REPAIR  1976   TONSILLECTOMY AND ADENOIDECTOMY     ULTRASOUND GUIDANCE FOR VASCULAR ACCESS  02/28/2018   Procedure: Ultrasound Guidance For Vascular Access;  Surgeon: Jettie Booze, MD;  Location: [facility];  Service: Cardiovascular;;     Current Outpatient Medications  Medication Sig Dispense Refill   amLODipine (NORVASC) 5 MG tablet TAKE 1 TABLET EVERY DAY 90 tablet 1   aspirin 81 MG EC tablet Take 81 mg by mouth daily.     Biotin 5000 MCG TABS Take 1 tablet by mouth daily.     Cholecalciferol (VITAMIN D3) 125 MCG (5000 UT) CAPS Take 5,000 Units by mouth daily.     clopidogrel (PLAVIX) 75 MG tablet TAKE 1 TABLET EVERY DAY 90 tablet 1   irbesartan-hydrochlorothiazide (AVALIDE) 300-12.5 MG tablet TAKE 1 TABLET EVERY DAY 90 tablet 1   metoprolol succinate (TOPROL-XL) 50 MG 24 hr tablet Take 1 tablet (50 mg total) by mouth daily. Take with or immediately following a meal. 90 tablet 2   nitroGLYCERIN (NITROSTAT) 0.4 MG SL tablet Place 1 tablet (0.4 mg total) under  the tongue every 5 (five) minutes as needed for chest pain. 75 tablet 1   potassium chloride SA (KLOR-CON M) 20 MEQ tablet TAKE 1 TABLET EVERY DAY 90 tablet 1   vitamin C (ASCORBIC ACID) 500 MG tablet Take 500 mg by mouth daily.     zinc gluconate 50 MG tablet Take 50 mg by mouth daily.     No current facility-administered medications for this visit.    Allergies:   Iodine,  Latex, Other, and Statins    Social History:  The patient  reports that she has never smoked. She has never used smokeless tobacco. She reports current alcohol use of about 4.0 standard drinks of alcohol per week. She reports that she does not use drugs.   Family History:  The patient's family history includes Asthma in her brother; Breast cancer (age of onset: 19) in her daughter; Breast cancer (age of onset: 36) in her sister; Colon cancer (age of onset: 71) in her son; Depression in her daughter; Diabetes in her paternal grandmother; Heart attack in her father; Heart disease in her father; Hypertension in her brother, maternal grandfather, maternal grandmother, and mother; Lung cancer in her sister; Melanoma in her daughter and mother; Renal Disease in her maternal grandfather; Stroke in her maternal grandfather and maternal grandmother; Ulcers in her father.    ROS:  Please see the history of present illness.   Otherwise, review of systems are positive for back pain.   All other systems are reviewed and negative.    PHYSICAL EXAM: VS:  BP (!) 154/68   Pulse 74   Ht 5' 4.5" (1.638 m)   Wt 144 lb 12.8 oz (65.7 kg)   LMP 11/29/1974   SpO2 97%   BMI 24.47 kg/m  , BMI Body mass index is 24.47 kg/m. GEN: Well nourished, well developed, in no acute distress HEENT: normal Neck: no JVD, carotid bruits, or masses Cardiac: RRR; no murmurs, rubs, or gallops,no edema  Respiratory:  clear to auscultation bilaterally, normal work of breathing GI: soft, nontender, nondistended, + BS MS: no deformity or atrophy Skin: warm  and dry, no rash Neuro:  Strength and sensation are intact Psych: euthymic mood, full affect   EKG:   The ekg ordered 8/23 demonstrates NSR, no ST changes   Recent Labs: 01/04/2023: ALT 16 02/04/2023: BUN 21; Creatinine, Ser 0.81; Potassium 3.8; Sodium 140; TSH 1.33   Lipid Panel    Component Value Date/Time   CHOL 223 (H) 01/04/2023 0915   CHOL 200 (H) 11/03/2021 1025   TRIG 74.0 01/04/2023 0915   HDL 81.90 01/04/2023 0915   HDL 79 11/03/2021 1025   CHOLHDL 3 01/04/2023 0915   VLDL 14.8 01/04/2023 0915   LDLCALC 127 (H) 01/04/2023 0915   LDLCALC 109 (H) 11/03/2021 1025   LDLDIRECT 121.7 09/08/2007 0852     Other studies Reviewed: Additional studies/ records that were reviewed today with results demonstrating: February 2024 total cholesterol 223 LDL 127 HDL 89 triglycerides 74 A1c 5.8.   ASSESSMENT AND PLAN:  CAD: Status post multivessel stenting.  No sx like her prior angina (dropping feeling in the chest).  Better control of lipids would be beneficial.  No cardiac symptoms after moving furniture.  May have led to the back problem.  Let us know if she has recurrent sx. Hyperlipidemia: High HDL.  LDL above target.  Statin intolerant.  She stopped Zetia due to muscle pain in her legs.  Saw Melissa and discussed repatha.  She is concerned because of her allergy to preservatives and cats.  Hypertension: Home BP readings are well controled.  Typically 110-130s.  Controlled at Dr. Doug Sou office.  Normal renal function and potassium in 3/24. Mitral regurgitation: No CHF sx.    Current medicines are reviewed at length with the patient today.  The patient concerns regarding her medicines were addressed.  The following changes have been made:  No change  Labs/ tests ordered today include:  No orders of  the defined types were placed in this encounter.   Recommend 150 minutes/week of aerobic exercise Low fat, low carb, high fiber diet recommended  Disposition:   FU in 1  year   Signed, Larae Grooms, MD  02/22/2023 9:54 AM    [facility] [place], [place], [place]  [address] Phone: [phone]; Fax: [phone]

## 2023-02-22 ENCOUNTER — Ambulatory Visit: Payer: HMO | Attending: Interventional Cardiology | Admitting: Interventional Cardiology

## 2023-02-22 ENCOUNTER — Encounter: Payer: Self-pay | Admitting: Interventional Cardiology

## 2023-02-22 VITALS — BP 154/68 | HR 74 | Ht 64.5 in | Wt 144.8 lb

## 2023-02-22 DIAGNOSIS — I2510 Atherosclerotic heart disease of native coronary artery without angina pectoris: Secondary | ICD-10-CM

## 2023-02-22 DIAGNOSIS — E785 Hyperlipidemia, unspecified: Secondary | ICD-10-CM | POA: Diagnosis not present

## 2023-02-22 DIAGNOSIS — I10 Essential (primary) hypertension: Secondary | ICD-10-CM | POA: Diagnosis not present

## 2023-02-22 DIAGNOSIS — I340 Nonrheumatic mitral (valve) insufficiency: Secondary | ICD-10-CM | POA: Diagnosis not present

## 2023-02-22 MED ORDER — IRBESARTAN-HYDROCHLOROTHIAZIDE 300-12.5 MG PO TABS: 1.0000 | ORAL_TABLET | Freq: Every day | ORAL | 3 refills | Status: DC

## 2023-02-22 MED ORDER — AMLODIPINE BESYLATE 5 MG PO TABS: 5.0000 mg | ORAL_TABLET | Freq: Every day | ORAL | 3 refills | Status: DC

## 2023-02-22 MED ORDER — POTASSIUM CHLORIDE CRYS ER 20 MEQ PO TBCR: 20.0000 meq | EXTENDED_RELEASE_TABLET | Freq: Every day | ORAL | 3 refills | Status: DC

## 2023-02-22 NOTE — Patient Instructions (Signed)
Medication Instructions:  Your physician recommends that you continue on your current medications as directed. Please refer to the Current Medication list given to you today.  *If you need a refill on your cardiac medications before your next appointment, please call your pharmacy*   Lab Work: none If you have labs (blood work) drawn today and your tests are completely normal, you will receive your results only by: MyChart Message (if you have MyChart) OR A paper copy in the mail If you have any lab test that is abnormal or we need to change your treatment, we will call you to review the results.   Testing/Procedures: none   Follow-Up: At [facility], you and your health needs are our priority.  As part of our continuing mission to provide you with exceptional heart care, we have created designated Provider Care Teams.  These Care Teams include your primary Cardiologist (physician) and Advanced Practice Providers (APPs -  Physician Assistants and Nurse Practitioners) who all work together to provide you with the care you need, when you need it.  We recommend signing up for the patient portal called "MyChart".  Sign up information is provided on this After Visit Summary.  MyChart is used to connect with patients for Virtual Visits (Telemedicine).  Patients are able to view lab/test results, encounter notes, upcoming appointments, etc.  Non-urgent messages can be sent to your provider as well.   To learn more about what you can do with MyChart, go to [email or web address].    Your next appointment:   12 month(s)  Provider:   Jayadeep Varanasi, MD     Other Instructions    

## 2023-02-23 ENCOUNTER — Telehealth: Payer: Self-pay | Admitting: Pharmacist

## 2023-02-23 NOTE — Telephone Encounter (Signed)
-----   Message from Jettie Booze, MD sent at 02/22/2023  5:36 PM EDT ----- I think you saw her in the past.  Can you arrange a visit or phone call with her? Thanks.  JV ----- Message ----- From: Ramond Dial, RPH-CPP Sent: 02/22/2023   4:53 PM EDT To: Jettie Booze, MD  Repatha is preservative free and is a human monoclonal antibody, those 2 allergies should not be a concern. Her other option would be nexletol.  ----- Message ----- From: Jettie Booze, MD Sent: 02/22/2023  10:50 AM EDT To: Ramond Dial, RPH-CPP  She stopped Zetia due to muscle pains.   She is concerned about repatha because of her allergy to preservatives and cats.  Ca you Try to see what other options are available? thanks

## 2023-02-23 NOTE — Telephone Encounter (Signed)
Called pt to see if she would like to schedule a follow up apt in lipid clinic to discuss her concerns.She states that she does not want to go on any medications. She understands there are medications that are not statins but she is not interested in starting anything at this time. I advised that if she changes her mind or has any questions she can always reach back out to [place].

## 2023-03-02 DIAGNOSIS — D1801 Hemangioma of skin and subcutaneous tissue: Secondary | ICD-10-CM | POA: Diagnosis not present

## 2023-03-02 DIAGNOSIS — L821 Other seborrheic keratosis: Secondary | ICD-10-CM | POA: Diagnosis not present

## 2023-03-02 DIAGNOSIS — I788 Other diseases of capillaries: Secondary | ICD-10-CM | POA: Diagnosis not present

## 2023-03-02 DIAGNOSIS — L814 Other melanin hyperpigmentation: Secondary | ICD-10-CM | POA: Diagnosis not present

## 2023-03-02 DIAGNOSIS — D2271 Melanocytic nevi of right lower limb, including hip: Secondary | ICD-10-CM | POA: Diagnosis not present

## 2023-03-02 DIAGNOSIS — Z85828 Personal history of other malignant neoplasm of skin: Secondary | ICD-10-CM | POA: Diagnosis not present

## 2023-03-02 DIAGNOSIS — L820 Inflamed seborrheic keratosis: Secondary | ICD-10-CM | POA: Diagnosis not present

## 2023-03-02 DIAGNOSIS — L720 Epidermal cyst: Secondary | ICD-10-CM | POA: Diagnosis not present

## 2023-03-02 DIAGNOSIS — D225 Melanocytic nevi of trunk: Secondary | ICD-10-CM | POA: Diagnosis not present

## 2023-03-14 ENCOUNTER — Telehealth: Payer: Self-pay | Admitting: Family Medicine

## 2023-03-14 NOTE — Telephone Encounter (Signed)
Ongoing from 3/8 visit, any other options for pt?

## 2023-03-14 NOTE — Telephone Encounter (Signed)
Pt is calling and would like to try prednisone that dr [place] had suggested if md thinks that will help [facility] [place], [place] - [address] [place] Phone: [phone]  Fax: [phone]

## 2023-03-14 NOTE — Telephone Encounter (Signed)
Pt call and stated she have numbness and tingling in her lower body and want a call back.

## 2023-03-16 ENCOUNTER — Other Ambulatory Visit: Payer: Self-pay | Admitting: Family Medicine

## 2023-03-16 DIAGNOSIS — R200 Anesthesia of skin: Secondary | ICD-10-CM

## 2023-03-16 MED ORDER — PREDNISONE 20 MG PO TABS: ORAL_TABLET | ORAL | 0 refills | Status: AC

## 2023-03-16 NOTE — Telephone Encounter (Signed)
Patient is aware of message below & verbalized understanding. 

## 2023-03-16 NOTE — Telephone Encounter (Signed)
Prescription for prednisone with instructions sent to her pharmacy.  Recommend taking medication with breakfast. If lower extremity numbness, tingling, and pain are persistent in 4 to 6 weeks we can consider a lumbar MRI. Thanks, BJ

## 2023-03-21 DIAGNOSIS — M545 Low back pain, unspecified: Secondary | ICD-10-CM | POA: Diagnosis not present

## 2023-03-24 DIAGNOSIS — M545 Low back pain, unspecified: Secondary | ICD-10-CM | POA: Diagnosis not present

## 2023-03-25 DIAGNOSIS — C50412 Malignant neoplasm of upper-outer quadrant of left female breast: Secondary | ICD-10-CM | POA: Diagnosis not present

## 2023-03-25 DIAGNOSIS — Z170 Estrogen receptor positive status [ER+]: Secondary | ICD-10-CM | POA: Diagnosis not present

## 2023-03-28 DIAGNOSIS — M545 Low back pain, unspecified: Secondary | ICD-10-CM | POA: Diagnosis not present

## 2023-03-30 DIAGNOSIS — M545 Low back pain, unspecified: Secondary | ICD-10-CM | POA: Diagnosis not present

## 2023-04-04 ENCOUNTER — Ambulatory Visit
Admission: RE | Admit: 2023-04-04 | Discharge: 2023-04-04 | Disposition: A | Discharge status: Home / Self Care | Payer: HMO | Source: Ambulatory Visit | Attending: Family Medicine | Admitting: Family Medicine

## 2023-04-04 DIAGNOSIS — Z853 Personal history of malignant neoplasm of breast: Secondary | ICD-10-CM | POA: Diagnosis not present

## 2023-04-04 DIAGNOSIS — R928 Other abnormal and inconclusive findings on diagnostic imaging of breast: Secondary | ICD-10-CM

## 2023-04-04 DIAGNOSIS — Z1231 Encounter for screening mammogram for malignant neoplasm of breast: Secondary | ICD-10-CM | POA: Diagnosis not present

## 2023-04-07 DIAGNOSIS — M545 Low back pain, unspecified: Secondary | ICD-10-CM | POA: Diagnosis not present

## 2023-04-08 ENCOUNTER — Telehealth: Payer: Self-pay | Admitting: Family Medicine

## 2023-04-08 DIAGNOSIS — R202 Paresthesia of skin: Secondary | ICD-10-CM

## 2023-04-08 NOTE — Telephone Encounter (Signed)
Per PCP's last note, okay to order MRI if no improvement after 4-6 weeks. I spoke with patient, she is aware that we will order the MRI, get it approved by insurance & then she will receive a call to schedule. Patient has tried physical therapy and dry needling with no relief. Patient verbalized understanding.

## 2023-04-08 NOTE — Telephone Encounter (Signed)
Requesting a call regarding feelings in her legs, saw another provider who suggested she get an MRI.

## 2023-04-20 ENCOUNTER — Ambulatory Visit
Admission: RE | Admit: 2023-04-20 | Discharge: 2023-04-20 | Disposition: A | Discharge status: Home / Self Care | Payer: HMO | Source: Ambulatory Visit | Attending: Family Medicine | Admitting: Family Medicine

## 2023-04-20 DIAGNOSIS — M5416 Radiculopathy, lumbar region: Secondary | ICD-10-CM | POA: Diagnosis not present

## 2023-04-20 DIAGNOSIS — R200 Anesthesia of skin: Secondary | ICD-10-CM

## 2023-04-22 ENCOUNTER — Encounter: Payer: Self-pay | Admitting: Genetic Counselor

## 2023-04-22 NOTE — Progress Notes (Signed)
UPDATE: MSH6 p.T1100M VUS has been amended to Likely Benign. Amended report date is 11/18/2022.

## 2023-05-09 ENCOUNTER — Other Ambulatory Visit: Payer: Self-pay

## 2023-05-09 DIAGNOSIS — I2510 Atherosclerotic heart disease of native coronary artery without angina pectoris: Secondary | ICD-10-CM

## 2023-05-09 MED ORDER — METOPROLOL SUCCINATE ER 50 MG PO TB24: 50.0000 mg | ORAL_TABLET | Freq: Every day | ORAL | 3 refills | Status: DC

## 2023-05-09 MED ORDER — CLOPIDOGREL BISULFATE 75 MG PO TABS: 75.0000 mg | ORAL_TABLET | Freq: Every day | ORAL | 3 refills | Status: DC

## 2023-05-09 NOTE — Telephone Encounter (Signed)
Pt's medications were sent to pt's pharmacy as requested. Confirmation received.  

## 2023-05-23 DIAGNOSIS — H903 Sensorineural hearing loss, bilateral: Secondary | ICD-10-CM | POA: Diagnosis not present

## 2023-05-23 DIAGNOSIS — H6123 Impacted cerumen, bilateral: Secondary | ICD-10-CM | POA: Diagnosis not present

## 2023-06-27 ENCOUNTER — Telehealth: Payer: Self-pay

## 2023-06-27 NOTE — Telephone Encounter (Signed)
LVM for patient to call back [phone], or to call PCP office to schedule follow up apt. AS, CMA  

## 2023-07-12 ENCOUNTER — Encounter: Payer: Self-pay | Admitting: Family Medicine

## 2023-07-12 ENCOUNTER — Ambulatory Visit (INDEPENDENT_AMBULATORY_CARE_PROVIDER_SITE_OTHER): Payer: HMO | Admitting: Family Medicine

## 2023-07-12 VITALS — BP 110/60 | HR 77 | Temp 98.2°F | Ht 64.5 in | Wt 146.1 lb

## 2023-07-12 DIAGNOSIS — J069 Acute upper respiratory infection, unspecified: Secondary | ICD-10-CM

## 2023-07-12 NOTE — Progress Notes (Signed)
Established Patient Office Visit  Subjective   Patient ID: Erika Brown, female    DOB: 1941-08-03  Age: 82 y.o. MRN: [id number]  Chief Complaint  Patient presents with   Cough    Patient complains of cough, x3 days, Non productive   Sinusitis    Patient complains of sinusitis, x3 days    Fever    Patient complains of fever, x3 days     HPI   Erika Brown is seen with onset about exactly a week ago with some sore throat, sneezing, cough.  No true documented fever.  She states a couple times her temperatures around 99 even which is high for her.  No true fever.  No body aches.  Denies any nausea, vomiting, or diarrhea.  Sore throat symptoms are some better.  Cough relatively mild.  No dyspnea.  No facial pain.  No significant headaches.  No sick contacts although she was at a wedding weekend prior to onset of her symptoms.  She did not do any COVID testing.  Past Medical History:  Diagnosis Date   Anginal pain (HCC)    Anxiety and depression    Breast cancer (HCC)    Breast cancer (HCC)    CAD (coronary artery disease)    S/P NSTEMI 4/06 EF 60%; Treated with Cypher DES to LAD and RCA with kissing balloon PCI diagonal; Treadmill myoview 10/09 for CP: Walked 7:06. mild ST-T-wave changes in recovery. EF 71%. Very mild reversible defect in base of the inferior wall --> medical rx.   Cancer (HCC)    HISTORY OF SKIN CANCER   Cataract    Complication of anesthesia    " DIFFICULT TO WAKE UP "   Depression    Family history of breast cancer 04/01/2021   Family history of colon cancer 04/01/2021   Family history of malignant melanoma 04/01/2021   Glucose intolerance (impaired glucose tolerance)    History of echocardiogram    Echo 11/17: Vigorous LVF, EF 65-70, normal wall motion, borderline diastolic function, systolic bowing of mitral valve without prolapse, mild MR, trivial TR, PASP 20   History of medication noncompliance    Hyperlipidemia    Intolerate to multiple statins due to  severe myalgias   Hypertension    Possible white-coat component   MI (myocardial infarction) (HCC)    acute-s/p stent   Microhematuria    Pneumonia due to COVID-19 virus    Psoriasis    Rapid heart beat    Vitamin D deficiency    Past Surgical History:  Procedure Laterality Date   ABDOMINAL HYSTERECTOMY     and anterior repair   APPENDECTOMY     bladder tact     BREAST BIOPSY     BREAST LUMPECTOMY Right 05/21/2021   BREAST LUMPECTOMY WITH RADIOACTIVE SEED LOCALIZATION Left 05/21/2021   Procedure: LEFT BREAST LUMPECTOMY WITH RADIOACTIVE SEED LOCALIZATION;  Surgeon: Emelia Loron, MD;  Location: [facility] OR;  Service: General;  Laterality: Left;   CARDIAC CATHETERIZATION  02/28/2018   CATARACT EXTRACTION Bilateral 2017   Dr. Dione Booze   CORONARY STENT INTERVENTION N/A 02/28/2018   Procedure: CORONARY STENT INTERVENTION;  Surgeon: Corky Crafts, MD;  Location: [facility] INVASIVE CV LAB;  Service: Cardiovascular;  Laterality: N/A;   CORONARY STENT PLACEMENT     CORONARY STENT PLACEMENT  04/02/019   EYE SURGERY Bilateral    cataract   HEMORRHOID SURGERY     LEFT HEART CATH AND CORONARY ANGIOGRAPHY N/A 02/28/2018   Procedure: LEFT  HEART CATH AND CORONARY ANGIOGRAPHY;  Surgeon: Corky Crafts, MD;  Location: [facility] INVASIVE CV LAB;  Service: Cardiovascular;  Laterality: N/A;   MOHS SURGERY  07/2013   [facility].  [facility]   RECTAL PROLAPSE REPAIR  1976   TONSILLECTOMY AND ADENOIDECTOMY     ULTRASOUND GUIDANCE FOR VASCULAR ACCESS  02/28/2018   Procedure: Ultrasound Guidance For Vascular Access;  Surgeon: Corky Crafts, MD;  Location: [facility] INVASIVE CV LAB;  Service: Cardiovascular;;    reports that she has never smoked. She has never used smokeless tobacco. She reports current alcohol use of about 4.0 standard drinks of alcohol per week. She reports that she does not use drugs. family history includes Asthma in her brother; Breast cancer (age of onset: 1) in her daughter; Breast cancer  (age of onset: 6) in her sister; Colon cancer (age of onset: 26) in her son; Depression in her daughter; Diabetes in her paternal grandmother; Heart attack in her father; Heart disease in her father; Hypertension in her brother, maternal grandfather, maternal grandmother, and mother; Lung cancer in her sister; Melanoma in her daughter and mother; Renal Disease in her maternal grandfather; Stroke in her maternal grandfather and maternal grandmother; Ulcers in her father. Allergies  Allergen Reactions   Iodine Shortness Of Breath and Palpitations    Has had some SHOB and rapid heart rate in the past   Latex Other (See Comments)    discomfort   Other Other (See Comments)    Some foods b/c of preservatives    Statins Other (See Comments)    Intolerance to high dosages, myalgia    Review of Systems  Constitutional:  Negative for chills and fever.  HENT:  Positive for congestion and sore throat.   Respiratory:  Positive for cough.       Objective:     BP 110/60 (BP Location: Left Arm, Patient Position: Sitting, Cuff Size: Normal)   Pulse 77   Temp 98.2 F (36.8 C) (Oral)   Ht 5' 4.5" (1.638 m)   Wt 146 lb 1.6 oz (66.3 kg)   LMP 11/29/1974   SpO2 98%   BMI 24.69 kg/m  BP Readings from Last 3 Encounters:  07/12/23 110/60  02/22/23 (!) 154/68  02/04/23 126/70   Wt Readings from Last 3 Encounters:  07/12/23 146 lb 1.6 oz (66.3 kg)  02/22/23 144 lb 12.8 oz (65.7 kg)  02/04/23 147 lb 2 oz (66.7 kg)      Physical Exam Vitals reviewed.  Constitutional:      General: She is not in acute distress.    Appearance: Normal appearance. She is not ill-appearing.  HENT:     Right Ear: Tympanic membrane normal.     Left Ear: Tympanic membrane normal.     Mouth/Throat:     Comments: Oropharynx reveals previous tonsillectomy.  No exudate.  No significant erythema.  Mucosa is moist. Cardiovascular:     Rate and Rhythm: Normal rate and regular rhythm.  Pulmonary:     Effort: Pulmonary  effort is normal.     Breath sounds: Normal breath sounds. No wheezing or rales.  Musculoskeletal:     Cervical back: Neck supple.  Lymphadenopathy:     Cervical: No cervical adenopathy.  Neurological:     Mental Status: She is alert.      No results found for any visits on 07/12/23.    The ASCVD Risk score (Arnett DK, et al., 2019) failed to calculate for the following reasons:  The 2019 ASCVD risk score is only valid for ages 30 to 59    Assessment & Plan:   Viral URI with cough.  Patient did not do any COVID testing and did not see any utility of testing at this point since she is past window for treatment.  Nonfocal exam.  No documented fever.  Continue plenty of fluids and rest and symptomatic treatment.  Follow-up promptly for any fever or other concerns  Evelena Peat, MD

## 2023-11-11 ENCOUNTER — Ambulatory Visit (INDEPENDENT_AMBULATORY_CARE_PROVIDER_SITE_OTHER): Payer: HMO | Admitting: Otolaryngology

## 2023-11-11 ENCOUNTER — Encounter (INDEPENDENT_AMBULATORY_CARE_PROVIDER_SITE_OTHER): Payer: Self-pay

## 2023-11-11 VITALS — Ht 63.0 in | Wt 146.0 lb

## 2023-11-11 DIAGNOSIS — J310 Chronic rhinitis: Secondary | ICD-10-CM

## 2023-11-11 DIAGNOSIS — R0981 Nasal congestion: Secondary | ICD-10-CM

## 2023-11-11 DIAGNOSIS — J343 Hypertrophy of nasal turbinates: Secondary | ICD-10-CM

## 2023-11-11 DIAGNOSIS — H6121 Impacted cerumen, right ear: Secondary | ICD-10-CM

## 2023-11-11 DIAGNOSIS — H903 Sensorineural hearing loss, bilateral: Secondary | ICD-10-CM | POA: Diagnosis not present

## 2023-11-12 DIAGNOSIS — J343 Hypertrophy of nasal turbinates: Secondary | ICD-10-CM | POA: Insufficient documentation

## 2023-11-12 DIAGNOSIS — J310 Chronic rhinitis: Secondary | ICD-10-CM | POA: Insufficient documentation

## 2023-11-12 DIAGNOSIS — H903 Sensorineural hearing loss, bilateral: Secondary | ICD-10-CM | POA: Insufficient documentation

## 2023-11-12 DIAGNOSIS — H6121 Impacted cerumen, right ear: Secondary | ICD-10-CM | POA: Insufficient documentation

## 2023-11-12 NOTE — Progress Notes (Signed)
Patient ID: Erika Brown, female   DOB: [birth year], 82 y.o.   MRN: [id number]  Cc: Progressive hearing loss, chronic nasal congestion, recurrent cerumen impaction  HPI: The patient is an 82 year old female who returns today for her follow-up evaluation.  She was last seen 6 months ago.  At that time, she was complaining of bilateral hearing loss and recurrent cerumen impaction.  She was noted to have bilateral dense cerumen impaction and bilateral high-frequency sensorineural hearing loss.  She was treated with cerumen disimpaction.  The hearing amplification options were also discussed.  The patient returns today complaining of progressive hearing difficulty.  In addition, she also complains of increasing nasal congestion, especially at night.  She is a habitual mouth breather at night.  Currently she denies any otalgia, otorrhea, facial pain, or fever.  Exam: General: Communicates without difficulty, well nourished, no acute distress. Head: Normocephalic, no evidence injury, no tenderness, facial buttresses intact without stepoff. Face/sinus: No tenderness to palpation and percussion. Facial movement is normal and symmetric. Eyes: PERRL, EOMI. No scleral icterus, conjunctivae clear. Neuro: CN II exam reveals vision grossly intact.  No nystagmus at any point of gaze. Ears: Auricles well formed without lesions.  Right ear cerumen impaction.  The left ear canal and tympanic membrane are normal.  Nose: External evaluation reveals normal support and skin without lesions.  Dorsum is intact.  Anterior rhinoscopy reveals congested mucosa over anterior aspect of inferior turbinates and intact septum.  No purulence noted. Oral:  Oral cavity and oropharynx are intact, symmetric, without erythema or edema.  Mucosa is moist without lesions. Neck: Full range of motion without pain.  There is no significant lymphadenopathy.  No masses palpable.  Thyroid bed within normal limits to palpation.  Parotid glands and  submandibular glands equal bilaterally without mass.  Trachea is midline. Neuro:  CN 2-12 grossly intact.   Procedure:  Flexible Nasal Endoscopy: Description: Risks, benefits, and alternatives of flexible endoscopy were explained to the patient.  Specific mention was made of the risk of throat numbness with difficulty swallowing, possible bleeding from the nose and mouth, and pain from the procedure.  The patient gave oral consent to proceed.  The flexible scope was inserted into the right nasal cavity.  Endoscopy of the interior nasal cavity, superior, inferior, and middle meatus was performed. The sphenoid-ethmoid recess was examined. Edematous mucosa was noted.  No polyp, mass, or lesion was appreciated. Olfactory cleft was clear.  Nasopharynx was clear.  Turbinates were hypertrophied but without mass.  The procedure was repeated on the contralateral side with similar findings.  The patient tolerated the procedure well.   Procedure: Right ear cerumen disimpaction Anesthesia: None Description: Under the operating microscope, the cerumen is carefully removed with a combination of cerumen currette, alligator forceps, and suction catheters.  After the cerumen is removed, the TMs are noted to be normal.  No mass, erythema, or lesions. The patient tolerated the procedure well.    Assessment: 1.  Recurrent right ear cerumen impaction.  After the disimpaction procedure, both tympanic membranes and middle ear spaces are noted to be normal. 2.  Chronic rhinitis with nasal mucosal congestion and bilateral inferior turbinate hypertrophy.  No polyps, mass, lesion, or acute infection is noted today. 3.  Bilateral high-frequency sensorineural hearing loss.  The patient defers hearing test today due to time constraint.  Plan: 1.  Otomicroscopy with right ear cerumen disimpaction. 2.  The physical exam and nasal endoscopy findings are reviewed with the patient. 3.  Flonase nasal spray 2 sprays each nostril  daily. 4.  The patient is a candidate for hearing amplification. 5.  The patient will return for reevaluation in 2 months.

## 2023-12-22 DIAGNOSIS — Z961 Presence of intraocular lens: Secondary | ICD-10-CM | POA: Diagnosis not present

## 2023-12-22 DIAGNOSIS — H1045 Other chronic allergic conjunctivitis: Secondary | ICD-10-CM | POA: Diagnosis not present

## 2023-12-22 DIAGNOSIS — H04123 Dry eye syndrome of bilateral lacrimal glands: Secondary | ICD-10-CM | POA: Diagnosis not present

## 2023-12-22 DIAGNOSIS — H35363 Drusen (degenerative) of macula, bilateral: Secondary | ICD-10-CM | POA: Diagnosis not present

## 2023-12-22 DIAGNOSIS — H11821 Conjunctivochalasis, right eye: Secondary | ICD-10-CM | POA: Diagnosis not present

## 2023-12-28 ENCOUNTER — Ambulatory Visit: Payer: HMO

## 2023-12-28 VITALS — Ht 63.0 in | Wt 143.0 lb

## 2023-12-28 DIAGNOSIS — Z0000 Encounter for general adult medical examination without abnormal findings: Secondary | ICD-10-CM

## 2023-12-28 NOTE — Patient Instructions (Addendum)
Ms. Puskarich , Thank you for taking time to come for your Medicare Wellness Visit. I appreciate your ongoing commitment to your health goals. Please review the following plan we discussed and let me know if I can assist you in the future.   Referrals/Orders/Follow-Ups/Clinician Recommendations:   This is a list of the screening recommended for you and due dates:  Health Maintenance  Topic Date Due   Zoster (Shingles) Vaccine (1 of 2) Never done   COVID-19 Vaccine (2 - Pfizer risk series) 07/16/2020   Flu Shot  Never done   Medicare Annual Wellness Visit  12/27/2024   DTaP/Tdap/Td vaccine (3 - Td or Tdap) 03/24/2030   Pneumonia Vaccine  Completed   HPV Vaccine  Aged Out   DEXA scan (bone density measurement)  Discontinued    Advanced directives: (Copy Requested) Please bring a copy of your health care power of attorney and living will to the office to be added to your chart at your convenience.  Next Medicare Annual Wellness Visit scheduled for next year: Yes

## 2023-12-28 NOTE — Progress Notes (Cosign Needed)
Subjective:   Erika Brown is a 83 y.o. female who presents for Medicare Annual (Subsequent) preventive examination.  Visit Complete: Virtual I connected with  Erika Brown on 12/28/23 by a audio enabled telemedicine application and verified that I am speaking with the correct person using two identifiers.  Patient Location: Home  Provider Location: Home Office  I discussed the limitations of evaluation and management by telemedicine. The patient expressed understanding and agreed to proceed.  Vital Signs: Because this visit was a virtual/telehealth visit, some criteria may be missing or patient reported. Any vitals not documented were not able to be obtained and vitals that have been documented are patient reported.    Cardiac Risk Factors include: advanced age (>87men, >20 women);hypertension     Objective:    Today's Vitals   12/28/23 0929  Weight: 143 lb (64.9 kg)  Height: 5\' 3"  (1.6 m)   Body mass index is 25.33 kg/m.     12/28/2023    9:38 AM 02/02/2022    9:21 AM 05/19/2021    2:16 PM 12/11/2020    9:23 AM 12/21/2019    1:00 AM 12/20/2019    3:39 PM 03/30/2018    9:42 PM  Advanced Directives  Does Patient Have a Medical Advance Directive? Yes Yes Yes Yes Yes Yes Yes  Type of Estate agent of [place];Living will Healthcare Power of [place];Living will Healthcare Power of [place];Living will Healthcare Power of [place];Living will Out of facility DNR (pink MOST or yellow form) Out of facility DNR (pink MOST or yellow form) Healthcare Power of [place];Living will  Does patient want to make changes to medical advance directive?   No - Patient declined No - Patient declined No - Patient declined No - Patient declined   Copy of Healthcare Power of Attorney in Chart? No - copy requested No - copy requested No - copy requested No - copy requested     Would patient like information on creating a medical advance directive?      No - Patient declined      Current Medications (verified) Outpatient Encounter Medications as of 12/28/2023  Medication Sig   amLODipine (NORVASC) 5 MG tablet Take 1 tablet (5 mg total) by mouth daily.   aspirin 81 MG EC tablet Take 81 mg by mouth daily.   Biotin 5000 MCG TABS Take 1 tablet by mouth daily.   Cholecalciferol (VITAMIN D3) 125 MCG (5000 UT) CAPS Take 5,000 Units by mouth daily.   clopidogrel (PLAVIX) 75 MG tablet Take 1 tablet (75 mg total) by mouth daily.   irbesartan-hydrochlorothiazide (AVALIDE) 300-12.5 MG tablet Take 1 tablet by mouth daily.   metoprolol succinate (TOPROL-XL) 50 MG 24 hr tablet Take 1 tablet (50 mg total) by mouth daily. Take with or immediately following a meal.   nitroGLYCERIN (NITROSTAT) 0.4 MG SL tablet Place 1 tablet (0.4 mg total) under the tongue every 5 (five) minutes as needed for chest pain.   potassium chloride SA (KLOR-CON M) 20 MEQ tablet Take 1 tablet (20 mEq total) by mouth daily.   vitamin C (ASCORBIC ACID) 500 MG tablet Take 500 mg by mouth daily.   zinc gluconate 50 MG tablet Take 50 mg by mouth daily.   No facility-administered encounter medications on file as of 12/28/2023.    Allergies (verified) Iodine, Latex, Other, and Statins   History: Past Medical History:  Diagnosis Date   Anginal pain (HCC)    Anxiety and depression    Breast  cancer (HCC)    Breast cancer (HCC)    CAD (coronary artery disease)    S/P NSTEMI 4/06 EF 60%; Treated with Cypher DES to LAD and RCA with kissing balloon PCI diagonal; Treadmill myoview 10/09 for CP: Walked 7:06. mild ST-T-wave changes in recovery. EF 71%. Very mild reversible defect in base of the inferior wall --> medical rx.   Cancer (HCC)    HISTORY OF SKIN CANCER   Cataract    Complication of anesthesia    " DIFFICULT TO WAKE UP "   Depression    Family history of breast cancer 04/01/2021   Family history of colon cancer 04/01/2021   Family history of malignant melanoma 04/01/2021   Glucose intolerance  (impaired glucose tolerance)    History of echocardiogram    Echo 11/17: Vigorous LVF, EF 65-70, normal wall motion, borderline diastolic function, systolic bowing of mitral valve without prolapse, mild MR, trivial TR, PASP 20   History of medication noncompliance    Hyperlipidemia    Intolerate to multiple statins due to severe myalgias   Hypertension    Possible white-coat component   MI (myocardial infarction) (HCC)    acute-s/p stent   Microhematuria    Pneumonia due to COVID-19 virus    Psoriasis    Rapid heart beat    Vitamin D deficiency    Past Surgical History:  Procedure Laterality Date   ABDOMINAL HYSTERECTOMY     and anterior repair   APPENDECTOMY     bladder tact     BREAST BIOPSY     BREAST LUMPECTOMY Right 05/21/2021   BREAST LUMPECTOMY WITH RADIOACTIVE SEED LOCALIZATION Left 05/21/2021   Procedure: LEFT BREAST LUMPECTOMY WITH RADIOACTIVE SEED LOCALIZATION;  Surgeon: Emelia Loron, MD;  Location: [facility] OR;  Service: General;  Laterality: Left;   CARDIAC CATHETERIZATION  02/28/2018   CATARACT EXTRACTION Bilateral 2017   Dr. Dione Booze   CORONARY STENT INTERVENTION N/A 02/28/2018   Procedure: CORONARY STENT INTERVENTION;  Surgeon: Corky Crafts, MD;  Location: [facility] INVASIVE CV LAB;  Service: Cardiovascular;  Laterality: N/A;   CORONARY STENT PLACEMENT     CORONARY STENT PLACEMENT  04/02/019   EYE SURGERY Bilateral    cataract   HEMORRHOID SURGERY     LEFT HEART CATH AND CORONARY ANGIOGRAPHY N/A 02/28/2018   Procedure: LEFT HEART CATH AND CORONARY ANGIOGRAPHY;  Surgeon: Corky Crafts, MD;  [facility]: [facility] INVASIVE CV LAB;  Service: Cardiovascular;  Laterality: N/A;   MOHS SURGERY  07/2013   BCC.  [facility]   RECTAL PROLAPSE REPAIR  1976   TONSILLECTOMY AND ADENOIDECTOMY     ULTRASOUND GUIDANCE FOR VASCULAR ACCESS  02/28/2018   Procedure: Ultrasound Guidance For Vascular Access;  Surgeon: Corky Crafts, MD;  Location: [facility] INVASIVE CV LAB;  Service:  Cardiovascular;;   Family History  Problem Relation Age of Onset   Heart disease Father    Heart attack Father    Ulcers Father    Hypertension Mother    Melanoma Mother        mets? bottom of foot; dx 80s   Melanoma Daughter        metastatic; left arm; d. 48   Stroke Maternal Grandmother    Hypertension Maternal Grandmother    Stroke Maternal Grandfather    Renal Disease Maternal Grandfather        kidney removed   Hypertension Maternal Grandfather    Diabetes Paternal Grandmother    Breast cancer Sister 81  mets   Lung cancer Sister        dx 63s   Hypertension Brother    Asthma Brother    Depression Daughter        x2   Breast cancer Daughter 12   Colon cancer Son 58   Coronary artery disease Neg Hx    Social History   Socioeconomic History   Marital status: Widowed    Spouse name: Not on file   Number of children: 7   Years of education: Not on file   Highest education level: Not on file  Occupational History   Occupation: Works at home, Designer, fashion/clothing, 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 with her husband   Seven kids   Plays tennis   Very good card player   Social Drivers of Health   Financial Resource Strain: Low Risk  (12/28/2023)   Overall Financial Resource Strain (CARDIA)    Difficulty of Paying Living Expenses: Not hard at all  Food Insecurity: No Food Insecurity (12/28/2023)   Hunger Vital Sign    Worried About Running Out of Food in the Last Year: Never true    Ran Out of Food in the  Last Year: Never true  Transportation Needs: No Transportation Needs (12/28/2023)   PRAPARE - Administrator, Civil Service (Medical): No    Lack of Transportation (Non-Medical): No  Physical Activity: Inactive (12/28/2023)   Exercise Vital Sign    Days of Exercise per Week: 0 days    Minutes of Exercise per Session: 0 min  Stress: No Stress Concern Present (12/28/2023)   [employer] of Occupational Health - Occupational Stress Questionnaire    Feeling of Stress : Not at all  Social Connections: Moderately Integrated (12/28/2023)   Social Connection and Isolation Panel [NHANES]    Frequency of Communication with Friends and Family: More than three times a week    Frequency of Social Gatherings with Friends and Family: More than three times a week    Attends Religious Services: More than 4 times per year    Active Member of [employer] or Organizations: Yes    Attends Banker Meetings: More than 4 times per year    Marital Status: Widowed    Tobacco Counseling Counseling given: Not Answered Tobacco comments: only a few times   Clinical Intake:  Pre-visit preparation completed: Yes  Pain : No/denies pain     BMI - recorded: 25.33 Nutritional Status: BMI 25 -29 Overweight Nutritional Risks: None Diabetes: No  How often do you need to have someone help you when you read instructions, pamphlets, or other written materials from your doctor or pharmacy?: 1 - Never  Interpreter Needed?: No  Information entered by :: Theresa Mulligan LPN   Activities of Daily Living    12/28/2023    9:35 AM  In your present state of health, do you have any difficulty performing the following  activities:  Hearing? 0  Vision? 0  Difficulty concentrating or making decisions? 0  Walking or climbing stairs? 0  Dressing or bathing? 0  Doing errands, shopping? 0  Preparing Food and eating ? N  Using the Toilet? N  In the past six months, have you accidently leaked urine? N   Do you have problems with loss of bowel control? N  Managing your Medications? N  Managing your Finances? N  Housekeeping or managing your Housekeeping? N    Patient Care Team: [place], Betty G, MD as PCP - General (Family Medicine) Corky Crafts, MD as PCP - Cardiology (Cardiology) Emelia Loron, MD as Consulting Physician (General Surgery) Erika Croissant, MD as Consulting Physician (Hematology and Oncology) Dorothy Puffer, MD as Consulting Physician (Radiation Oncology) Axel Filler Larna Daughters, NP as Nurse Practitioner (Hematology and Oncology) Maryclare Labrador, RN as Registered Nurse  Indicate any recent Medical Services you may have received from other than [facility] providers in the past year (date may be approximate).     Assessment:   This is a routine wellness examination for Nikira.  Hearing/Vision screen Hearing Screening - Comments:: Denies hearing difficulties   Vision Screening - Comments:: Wears rx glasses - up to date with routine eye exams with  Dr Dione Booze   Goals Addressed               This Visit's Progress     Increase physical activity (pt-stated)        Stay active.       Depression Screen    12/28/2023    9:35 AM 01/04/2023    8:35 AM 05/21/2022   11:05 AM 02/02/2022    9:24 AM 09/17/2021   11:42 AM 12/11/2020    9:25 AM 09/09/2020    8:57 PM  PHQ 2/9 Scores  PHQ - 2 Score 0 0 0 0 0 0 0    Fall Risk    12/28/2023    9:36 AM 01/04/2023    8:35 AM 05/21/2022   11:05 AM 02/02/2022    9:24 AM 12/11/2020    9:24 AM  Fall Risk   Falls in the past year? 0 0 0 0 0  Number falls in past yr: 0 0 0  0  Injury with Fall? 0 0 0  0  Risk for fall due to : No Fall Risks Other (Comment) No Fall Risks Medication side effect No Fall Risks  Follow up Falls prevention discussed Falls evaluation completed Falls evaluation completed Falls evaluation completed;Education provided;Falls prevention discussed Falls evaluation completed;Falls prevention discussed     MEDICARE RISK AT HOME: Medicare Risk at Home Any stairs in or around the home?: No If so, are there any without handrails?: No Home free of loose throw rugs in walkways, pet beds, electrical cords, etc?: Yes Adequate lighting in your home to reduce risk of falls?: Yes Life alert?: Yes Use of a cane, walker or w/c?: No Grab bars in the bathroom?: Yes Shower chair or bench in shower?: No Elevated toilet seat or a handicapped toilet?: No  TIMED UP AND GO:  Was the test performed?  No    Cognitive Function:        12/28/2023    9:38 AM 02/02/2022    9:28 AM  6CIT Screen  What Year? 0 points 0 points  What month? 0 points 0 points  What time? 0 points 0 points  Count back from 20 0 points 0 points  Months in reverse 0 points  2 points  Repeat phrase 0 points 0 points  Total Score 0 points 2 points    Immunizations Immunization History  Administered Date(s) Administered   PFIZER(Purple Top)SARS-COV-2 Vaccination 06/25/2020   Pneumococcal Conjugate-13 11/10/2015   Pneumococcal Polysaccharide-23 07/18/2006, 11/07/2019   Tdap 03/23/2010, 03/24/2020    TDAP status: Up to date  Flu Vaccine status: Declined, Education has been provided regarding the importance of this vaccine but patient still declined. Advised may receive this vaccine at local pharmacy or Health Dept. Aware to provide a copy of the vaccination record if obtained from local pharmacy or Health Dept. Verbalized acceptance and understanding.  Pneumococcal vaccine status: Up to date  Covid-19 vaccine status: Declined, Education has been provided regarding the importance of this vaccine but patient still declined. Advised may receive this vaccine at local pharmacy or Health Dept.or vaccine clinic. Aware to provide a copy of the vaccination record if obtained from local pharmacy or Health Dept. Verbalized acceptance and understanding.  Qualifies for Shingles Vaccine? Yes   Zostavax completed No   Shingrix  Completed?: No.    Education has been provided regarding the importance of this vaccine. Patient has been advised to call insurance company to determine out of pocket expense if they have not yet received this vaccine. Advised may also receive vaccine at local pharmacy or Health Dept. Verbalized acceptance and understanding.  Screening Tests Health Maintenance  Topic Date Due   Zoster Vaccines- Shingrix (1 of 2) Never done   COVID-19 Vaccine (2 - Pfizer risk series) 07/16/2020   INFLUENZA VACCINE  Never done   Medicare Annual Wellness (AWV)  12/27/2024   DTaP/Tdap/Td (3 - Td or Tdap) 03/24/2030   Pneumonia Vaccine 7+ Years old  Completed   HPV VACCINES  Aged Out   DEXA SCAN  Discontinued    Health Maintenance  Health Maintenance Due  Topic Date Due   Zoster Vaccines- Shingrix (1 of 2) Never done   COVID-19 Vaccine (2 - Pfizer risk series) 07/16/2020   INFLUENZA VACCINE  Never done        Additional Screening:    Vision Screening: Recommended annual ophthalmology exams for early detection of glaucoma and other disorders of the eye. Is the patient up to date with their annual eye exam?  Yes  Who is the provider or what is the name of the office in which the patient attends annual eye exams? Dr Dione Booze If pt is not established with a provider, would they like to be referred to a provider to establish care? No .   Dental Screening: Recommended annual dental exams for proper oral hygiene    Community Resource Referral / Chronic Care Management:  CRR required this visit?  No   CCM required this visit?  No     Plan:     I have personally reviewed and noted the following in the patient's chart:   Medical and social history Use of alcohol, tobacco or illicit drugs  Current medications and supplements including opioid prescriptions. Patient is not currently taking opioid prescriptions. Functional ability and status Nutritional status Physical activity Advanced  directives List of other physicians Hospitalizations, surgeries, and ER visits in previous 12 months Vitals Screenings to include cognitive, depression, and falls Referrals and appointments  In addition, I have reviewed and discussed with patient certain preventive protocols, quality metrics, and best practice recommendations. A written personalized care plan for preventive services as well as general preventive health recommendations were provided to patient.     Meriam Sprague  Kandis Fantasia, LPN   1/61/0960   After Visit Summary: (MyChart) Due to this being a telephonic visit, the after visit summary with patients personalized plan was offered to patient via MyChart   Nurse Notes: None

## 2024-01-04 NOTE — Progress Notes (Signed)
 HPI: Erika Brown is a 83 y.o. female with a PMHx significant for CAD, HTN, HLD, vitamin D  deficiency, sensorineural hearing loss,and hx of breast cancer, who is here today for her routine physical.  Last CPE: 01/04/2023.She has since seen ENT, cardiology, and general surgery.   Exercise: Patient states she is active through yard work and chores around the house.  Diet: She mainly cooks at home and is eating healthy in general. She eats vegetables daily.  Sleep: ~10 hours per night.  Alcohol  Use: Occasional social use.  Smoking: Never Vision: UTD on routine vision care. Last appointment was 2 weeks ago.  Dental: UTD on routine dental care.   Immunization History  Administered Date(s) Administered   PFIZER(Purple Top)SARS-COV-2 Vaccination 06/25/2020   Pneumococcal Conjugate-13 11/10/2015   Pneumococcal Polysaccharide-23 07/18/2006, 11/07/2019   Tdap 03/23/2010, 03/24/2020   Health Maintenance  Topic Date Due   Zoster Vaccines- Shingrix (1 of 2) Never done   COVID-19 Vaccine (2 - Pfizer risk series) 07/16/2020   INFLUENZA VACCINE  Never done   Medicare Annual Wellness (AWV)  12/27/2024   DTaP/Tdap/Td (3 - Td or Tdap) 03/24/2030   Pneumonia Vaccine 98+ Years old  Completed   HPV VACCINES  Aged Out   DEXA SCAN  Discontinued   Chronic medical problems:   Hypertension: Currently on amlodipine  5 mg daily, Avalide 300-12.5 mg daily, and metoprolol  succinate 50 mg daily.  Negative for unusual or severe headache, visual changes, exertional chest pain, dyspnea,  focal weakness, or edema.  Lab Results  Component Value Date   CREATININE 0.81 02/04/2023   BUN 21 02/04/2023   NA 140 02/04/2023   K 3.8 02/04/2023   CL 103 02/04/2023   CO2 29 02/04/2023   HypoK+ on KLOR 20 meq daily. Vit D def: She is taking vitamin D  supplementation  CAD:  S/p stent placement and angioplasty in 2006.  Cardiac cath in 02/2018: In stent re-stenosis of LAD and severe stenosis Lcx and RCA.  Underwent multivessel stenting with DES. Currently on Plavix  75 mg.  Her next appointment with cardiology is in 01/2024.   Hyperlipidemia: Not currently on pharmacologic treatment.  She did not tolerate different statins and Zetia .  Lab Results  Component Value Date   CHOL 223 (H) 01/04/2023   HDL 81.90 01/04/2023   LDLCALC 127 (H) 01/04/2023   LDLDIRECT 121.7 09/08/2007   TRIG 74.0 01/04/2023   CHOLHDL 3 01/04/2023   She mentions that since her last visit, she developed lower back pain, 02/2023.  She has done physical therapy, but says it does not seem to help much.  Took  Advil for a few days and back pain greatly improved.  Upper-outer quadrant left breast cancer, estrogen positive. S/P left lumpectomy in 04/2021. Declined antiestrogen therapy. Last saw surgeon 03/25/23.  Patient reports no new concerns today.  Review of Systems  Constitutional:  Negative for activity change, appetite change and fever.  HENT:  Negative for mouth sores and sore throat.   Eyes:  Negative for redness and visual disturbance.  Respiratory:  Negative for cough, shortness of breath and wheezing.   Cardiovascular:  Negative for chest pain and leg swelling.  Gastrointestinal:  Negative for abdominal pain, nausea and vomiting.       No changes in bowel habits.  Endocrine: Negative for cold intolerance, heat intolerance, polydipsia, polyphagia and polyuria.  Genitourinary:  Negative for decreased urine volume, dysuria and hematuria.  Musculoskeletal:  Positive for arthralgias. Negative for gait problem.  Skin:  Negative for color change and rash.  Allergic/Immunologic: Negative for environmental allergies.  Neurological:  Negative for seizures, syncope, weakness and headaches.  Psychiatric/Behavioral:  Negative for confusion and hallucinations.   All other systems reviewed and are negative.  Current Outpatient Medications on File Prior to Visit  Medication Sig Dispense Refill   amLODipine  (NORVASC )  5 MG tablet Take 1 tablet (5 mg total) by mouth daily. 90 tablet 3   aspirin  81 MG EC tablet Take 81 mg by mouth daily.     Biotin 5000 MCG TABS Take 1 tablet by mouth daily.     Cholecalciferol (VITAMIN D3) 125 MCG (5000 UT) CAPS Take 5,000 Units by mouth daily.     clopidogrel  (PLAVIX ) 75 MG tablet Take 1 tablet (75 mg total) by mouth daily. 90 tablet 3   irbesartan -hydrochlorothiazide  (AVALIDE) 300-12.5 MG tablet Take 1 tablet by mouth daily. 90 tablet 3   metoprolol  succinate (TOPROL -XL) 50 MG 24 hr tablet Take 1 tablet (50 mg total) by mouth daily. Take with or immediately following a meal. 90 tablet 3   nitroGLYCERIN  (NITROSTAT ) 0.4 MG SL tablet Place 1 tablet (0.4 mg total) under the tongue every 5 (five) minutes as needed for chest pain. 75 tablet 1   potassium chloride  SA (KLOR-CON  M) 20 MEQ tablet Take 1 tablet (20 mEq total) by mouth daily. 90 tablet 3   vitamin C (ASCORBIC ACID ) 500 MG tablet Take 500 mg by mouth daily.     zinc  gluconate 50 MG tablet Take 50 mg by mouth daily.     No current facility-administered medications on file prior to visit.   Past Medical History:  Diagnosis Date   Anginal pain (HCC)    Anxiety and depression    Breast cancer (HCC)    Breast cancer (HCC)    CAD (coronary artery disease)    S/P NSTEMI 4/06 EF 60%; Treated with Cypher DES to LAD and RCA with kissing balloon PCI diagonal; Treadmill myoview  10/09 for CP: Walked 7:06. mild ST-T-wave changes in recovery. EF 71%. Very mild reversible defect in base of the inferior wall --> medical rx.   Cancer (HCC)    HISTORY OF SKIN CANCER   Cataract    Complication of anesthesia     DIFFICULT TO WAKE UP    Depression    Family history of breast cancer 04/01/2021   Family history of colon cancer 04/01/2021   Family history of malignant melanoma 04/01/2021   Glucose intolerance (impaired glucose tolerance)    History of echocardiogram    Echo 11/17: Vigorous LVF, EF 65-70, normal wall motion,  borderline diastolic function, systolic bowing of mitral valve without prolapse, mild MR, trivial TR, PASP 20   History of medication noncompliance    Hyperlipidemia    Intolerate to multiple statins due to severe myalgias   Hypertension    Possible white-coat component   MI (myocardial infarction) (HCC)    acute-s/p stent   Microhematuria    Pneumonia due to COVID-19 virus    Psoriasis    Rapid heart beat    Vitamin D  deficiency     Past Surgical History:  Procedure Laterality Date   ABDOMINAL HYSTERECTOMY     and anterior repair   APPENDECTOMY     bladder tact     BREAST BIOPSY     BREAST LUMPECTOMY Right 05/21/2021   BREAST LUMPECTOMY WITH RADIOACTIVE SEED LOCALIZATION Left 05/21/2021   Procedure: LEFT BREAST LUMPECTOMY WITH RADIOACTIVE SEED LOCALIZATION;  Surgeon: Ebbie Cough,  MD;  Location: [facility] OR;  Service: General;  Laterality: Left;   CARDIAC CATHETERIZATION  02/28/2018   CATARACT EXTRACTION Bilateral 2017   Dr. Octavia   CORONARY STENT INTERVENTION N/A 02/28/2018   Procedure: CORONARY STENT INTERVENTION;  Surgeon: Dann Candyce RAMAN, MD;  Location[facility] INVAS[facility] CV LAB;  Service: Cardiovascular;  Laterality: N/A;   CORONARY STENT PLACEMENT     CORONARY STENT PLACEMENT  04/02/019   EYE SURGERY Bilateral    cataract   HEMORRHOID SURGERY     LEFT HEART CATH AND CORONARY ANGIOGRAPHY N/A 02/28/2018   Procedure: LEFT HEART CATH AND CORONARY ANGIOGRAPHY;  Surgeon: Dann Candyce RAMAN, MD;  Location: [facility] INVAS[facility] CV LAB;  Service: Cardiovascular;  Laterality: N/A;   MOHS SURGERY  07/2013   B[facility].  [facility]   RECTAL PROLAPSE REPAIR  1976   TONSILLECTOMY AND ADENOIDECTOMY     ULTRASOUND GUIDANCE FOR VASCULAR ACCESS  02/28/2018   Procedure: Ultrasound Guidance For Vascular Access;  Surgeon: Dann Candyce RAMAN, MD;  Location: [facility] INVASIVE CV LAB;  Service: Cardiovascular;;    Allergies  Allergen Reactions   Iodine Shortness Of Breath and Palpitations    Has had some  SHOB and rapid heart rate in the past   Latex Other (See Comments)    discomfort   Other Other (See Comments)    Some foods b/c of preservatives    Statins Other (See Comments)    Intolerance to high dosages, myalgia    Family History  Problem Relation Age of Onset   Heart disease Father    Heart attack Father    Ulcers Father    Hypertension Mother    Melanoma Mother        mets? bottom of foot; dx 80s   Melanoma Daughter        metastatic; left arm; d. 48   Stroke Maternal Grandmother    Hypertension Maternal Grandmother    Stroke Maternal Grandfather    Renal Disease Maternal Grandfather        kidney removed   Hypertension Maternal Grandfather    Diabetes Paternal Grandmother    Breast cancer Sister 34       mets   Lung cancer Sister        dx 68s   Hypertension Brother    Asthma Brother    Depression Daughter        x2   Breast cancer Daughter 21   Colon cancer Son 52   Coronary artery disease Neg Hx     Social History   Socioeconomic History   Marital status: Widowed    Spouse name: Not on file   Number of children: 7   Years of education: Not on file   Highest education level: Not on file  Occupational History   Occupation: Works at home, designer, fashion/clothing, 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  Tobacco Use   Smoking status: Never   Smokeless tobacco: Never   Tobacco comments:    only a few times  Vaping Use   Vaping status: Never Used  Substance and Sexual Activity   Alcohol  use: Yes    Alcohol /week: 4.0 standard drinks of alcohol     Types: 4 Glasses of wine per week   Drug use: No   Sexual activity: Not Currently    Birth control/protection: Abstinence, Surgical    Comment: TVH  Other Topics Concern   Not on file  Social History Narrative   Lives in [place] with her husband   Seven kids   Plays  tennis   Very good card player   Social Drivers of Health   Financial Resource Strain: Low Risk  (12/28/2023)   Overall Financial Resource Strain (CARDIA)     Difficulty of Paying Living Expenses: Not hard at all  Food Insecurity: No Food Insecurity (12/28/2023)   Hunger Vital Sign    Worried About Running Out of Food in the Last Year: Never true    Ran Out of Food in the Last Year: Never true  Transportation Needs: No Transportation Needs (12/28/2023)   PRAPARE - Administrator, Civil Service (Medical): No    Lack of Transportation (Non-Medical): No  Physical Activity: Inactive (12/28/2023)   Exercise Vital Sign    Days of Exercise per Week: 0 days    Minutes of Exercise per Session: 0 min  Stress: No Stress Concern Present (12/28/2023)   [employer] of Occupational Health - Occupational Stress Questionnaire    Feeling of Stress : Not at all  Social Connections: Moderately Integrated (12/28/2023)   Social Connection and Isolation Panel [NHANES]    Frequency of Communication with Friends and Family: More than three times a week    Frequency of Social Gatherings with Friends and Family: More than three times a week    Attends Religious Services: More than 4 times per year    Active Member of [employer] or Organizations: Yes    Attends Banker Meetings: More than 4 times per year    Marital Status: Widowed    Today's Vitals   01/06/24 0754  BP: 126/70  Pulse: 94  Resp: 16  SpO2: 95%  Weight: 145 lb 6 oz (65.9 kg)  Height: 5' 3 (1.6 m)   Body mass index is 25.75 kg/m.  Wt Readings from Last 3 Encounters:  01/06/24 145 lb 6 oz (65.9 kg)  12/28/23 143 lb (64.9 kg)  11/11/23 146 lb (66.2 kg)   Physical Exam Vitals and nursing note reviewed.  Constitutional:      General: She is not in acute distress.    Appearance: She is well-developed.  HENT:     Head: Normocephalic and atraumatic.     Right Ear: Hearing, tympanic membrane, ear canal and external ear normal.     Left Ear: Hearing, tympanic membrane, ear canal and external ear normal.     Mouth/Throat:     Mouth: Mucous membranes are moist.     Pharynx:  Oropharynx is clear. Uvula midline.  Eyes:     Conjunctiva/sclera: Conjunctivae normal.     Pupils: Pupils are equal, round, and reactive to light.  Neck:     Thyroid : No thyroid  mass or thyromegaly (palpable.).  Cardiovascular:     Rate and Rhythm: Normal rate. Rhythm irregular.     Pulses:          Dorsalis pedis pulses are 2+ on the right side and 2+ on the left side.     Heart sounds: No murmur heard. Pulmonary:     Effort: Pulmonary effort is normal. No respiratory distress.     Breath sounds: Normal breath sounds.  Abdominal:     Palpations: Abdomen is soft. There is no hepatomegaly or mass.     Tenderness: There is no abdominal tenderness.  Genitourinary:    Comments: No concerns. Musculoskeletal:     Right lower leg: No edema.     Left lower leg: No edema.     Comments: No major deformity or signs of synovitis appreciated.  Lymphadenopathy:  Cervical: No cervical adenopathy.  Skin:    General: Skin is warm.     Findings: No erythema or rash.  Neurological:     General: No focal deficit present.     Mental Status: She is alert and oriented to person, place, and time.     Cranial Nerves: No cranial nerve deficit.     Sensory: No sensory deficit.     Motor: No weakness.     Coordination: Coordination normal.     Gait: Gait normal.     Deep Tendon Reflexes:     Reflex Scores:      Bicep reflexes are 2+ on the right side and 2+ on the left side.      Patellar reflexes are 2+ on the right side and 2+ on the left side. Psychiatric:        Mood and Affect: Mood and affect normal.    ASSESSMENT AND PLAN:  Erika Brown was here today for her annual physical examination.  Lab Results  Component Value Date   CHOL 222 (H) 01/06/2024   HDL 78.00 01/06/2024   LDLCALC 127 (H) 01/06/2024   LDLDIRECT 121.7 09/08/2007   TRIG 87.0 01/06/2024   CHOLHDL 3 01/06/2024   Lab Results  Component Value Date   NA 141 01/06/2024   CL 104 01/06/2024   K 4.0 01/06/2024    CO2 26 01/06/2024   BUN 22 01/06/2024   CREATININE 0.84 01/06/2024   GFR 63.98 01/06/2024   CALCIUM 9.8 01/06/2024   ALBUMIN 4.1 01/06/2024   GLUCOSE 99 01/06/2024   Lab Results  Component Value Date   ALT 17 01/06/2024   AST 17 01/06/2024   ALKPHOS 81 01/06/2024   BILITOT 0.5 01/06/2024   Lab Results  Component Value Date   VD25OH 45.75 01/06/2024   Lab Results  Component Value Date   TSH 1.56 01/06/2024   Routine general medical examination at a health care facility Assessment & Plan: We discussed the importance of regular physical activity and healthy diet for prevention of chronic illness and/or complications. Preventive guidelines reviewed. Vaccination up to date. Ca++ and vit D supplementation to continue. Next CPE in a year.   Hypertension with heart disease Assessment & Plan: BP well controlled. Continue  amlodipine  5 mg daily, Irbesartan -HCTZ 300-12.5 mg daily, and metoprolol  succinate 50 mg daily.  Continue low salt diet. Has an appt with her cardiologist in 01/2024.  Orders: -     Comprehensive metabolic panel; Future -     TSH; Future  Malignant neoplasm of upper-outer quadrant of left breast in female, estrogen receptor positive (HCC) Assessment & Plan: Upper-outer quadrant left breast cancer, estrogen positive. S/P left lumpectomy in 04/2021. Declined antiestrogen therapy. Not sure about next f/u with surgeon. Due for mammogram in 03/2024.   Hyperlipidemia LDL goal <70 Assessment & Plan: LDL not at goal, 127 in 12/2022. Has not tolerated statins  and did not tolerate Zetia , both caused myalgias. Continue low fat diet. Follows with cardiologist.  Orders: -     Comprehensive metabolic panel; Future -     Lipid panel; Future  Vitamin D  deficiency, unspecified Assessment & Plan: Continue same dose of vit D supplementation. Further recommendations according to 25 OH vit D result.  Orders: -     Comprehensive metabolic panel; Future -      VITAMIN D  25 Hydroxy (Vit-D Deficiency, Fractures); Future  Hypokalemia Assessment & Plan: Continue KLOR 20 meq daily.    Return in 1  year (on 01/05/2025) for CPE, Labs.   I, Leonce PARAS Wierda, acting as a scribe for Chrystie Hagwood, MD., have documented all relevant documentation on the behalf of Marua Qin, MD, as directed by  Tao Satz, MD while in the presence of Daimon Kean, MD.   I, Susa Bones, MD, have reviewed all documentation for this visit. The documentation on 01/04/24 for the exam, diagnosis, procedures, and orders are all accurate and complete.  Lyric Rossano G. Talon Regala, MD  [facility]. [place] office.

## 2024-01-06 ENCOUNTER — Encounter: Payer: Self-pay | Admitting: Family Medicine

## 2024-01-06 ENCOUNTER — Ambulatory Visit (INDEPENDENT_AMBULATORY_CARE_PROVIDER_SITE_OTHER): Payer: HMO | Admitting: Family Medicine

## 2024-01-06 VITALS — BP 126/70 | HR 94 | Resp 16 | Ht 63.0 in | Wt 145.4 lb

## 2024-01-06 DIAGNOSIS — E559 Vitamin D deficiency, unspecified: Secondary | ICD-10-CM

## 2024-01-06 DIAGNOSIS — I119 Hypertensive heart disease without heart failure: Secondary | ICD-10-CM

## 2024-01-06 DIAGNOSIS — E876 Hypokalemia: Secondary | ICD-10-CM | POA: Diagnosis not present

## 2024-01-06 DIAGNOSIS — E785 Hyperlipidemia, unspecified: Secondary | ICD-10-CM | POA: Diagnosis not present

## 2024-01-06 DIAGNOSIS — E7800 Pure hypercholesterolemia, unspecified: Secondary | ICD-10-CM

## 2024-01-06 DIAGNOSIS — Z0000 Encounter for general adult medical examination without abnormal findings: Secondary | ICD-10-CM

## 2024-01-06 DIAGNOSIS — Z170 Estrogen receptor positive status [ER+]: Secondary | ICD-10-CM | POA: Diagnosis not present

## 2024-01-06 DIAGNOSIS — C50412 Malignant neoplasm of upper-outer quadrant of left female breast: Secondary | ICD-10-CM | POA: Diagnosis not present

## 2024-01-06 LAB — LIPID PANEL
Cholesterol: 222 mg/dL — ABNORMAL HIGH (ref 0–200)
HDL: 78 mg/dL (ref >39.00)
LDL Cholesterol: 127 mg/dL — ABNORMAL HIGH (ref 0–99)
NonHDL: 144.15
Total CHOL/HDL Ratio: 3
Triglycerides: 87 mg/dL (ref 0.0–149.0)
VLDL: 17.4 mg/dL (ref 0.0–40.0)

## 2024-01-06 LAB — VITAMIN D 25 HYDROXY (VIT D DEFICIENCY, FRACTURES): VITD: 45.75 ng/mL (ref 30.00–100.00)

## 2024-01-06 LAB — COMPREHENSIVE METABOLIC PANEL
ALT: 17 U/L (ref 0–35)
AST: 17 U/L (ref 0–37)
Albumin: 4.1 g/dL (ref 3.5–5.2)
Alkaline Phosphatase: 81 U/L (ref 39–117)
BUN: 22 mg/dL (ref 6–23)
CO2: 26 meq/L (ref 19–32)
Calcium: 9.8 mg/dL (ref 8.4–10.5)
Chloride: 104 meq/L (ref 96–112)
Creatinine, Ser: 0.84 mg/dL (ref 0.40–1.20)
GFR: 63.98 mL/min (ref >60.00)
Glucose, Bld: 99 mg/dL (ref 70–99)
Potassium: 4 meq/L (ref 3.5–5.1)
Sodium: 141 meq/L (ref 135–145)
Total Bilirubin: 0.5 mg/dL (ref 0.2–1.2)
Total Protein: 7 g/dL (ref 6.0–8.3)

## 2024-01-06 LAB — TSH: TSH: 1.56 u[IU]/mL (ref 0.35–5.50)

## 2024-01-06 NOTE — Assessment & Plan Note (Addendum)
 BP well controlled. Continue  amlodipine  5 mg daily, Irbesartan -HCTZ 300-12.5 mg daily, and metoprolol  succinate 50 mg daily.  Continue low salt diet. Has an appt with her cardiologist in 01/2024.

## 2024-01-06 NOTE — Assessment & Plan Note (Signed)
 Continue same dose of vit D supplementation. Further recommendations according to 25 OH vit D result.

## 2024-01-06 NOTE — Assessment & Plan Note (Signed)
We discussed the importance of regular physical activity and healthy diet for prevention of chronic illness and/or complications. Preventive guidelines reviewed. Vaccination up to date. Ca++ and vit D supplementation to continue. Next CPE in a year. 

## 2024-01-06 NOTE — Assessment & Plan Note (Addendum)
 LDL not at goal, 127 in 12/2022. Has not tolerated statins  and did not tolerate Zetia , both caused myalgias. Continue low fat diet. Follows with cardiologist.

## 2024-01-06 NOTE — Assessment & Plan Note (Addendum)
 Upper-outer quadrant left breast cancer, estrogen positive. S/P left lumpectomy in 04/2021. Declined antiestrogen therapy. Not sure about next f/u with surgeon. Due for mammogram in 03/2024.

## 2024-01-06 NOTE — Patient Instructions (Addendum)
 A few things to remember from today's visit:  Routine general medical examination at a health care facility  Hypertension with heart disease - Plan: Comprehensive metabolic panel, TSH  Malignant neoplasm of upper-outer quadrant of left breast in female, estrogen receptor positive (HCC), Chronic  Hyperlipidemia LDL goal <70 - Plan: Comprehensive metabolic panel, Lipid panel  Vitamin D  deficiency, unspecified - Plan: Comprehensive metabolic panel, VITAMIN D  25 Hydroxy (Vit-D Deficiency, Fractures)  If you need refills for medications you take chronically, please call your pharmacy. Do not use My Chart to request refills or for acute issues that need immediate attention. If you send a my chart message, it may take a few days to be addressed, specially if I am not in the office.  Please be sure medication list is accurate. If a new problem present, please set up appointment sooner than planned today.

## 2024-01-06 NOTE — Assessment & Plan Note (Signed)
 Continue KLOR 20 meq daily.

## 2024-02-23 ENCOUNTER — Encounter (HOSPITAL_BASED_OUTPATIENT_CLINIC_OR_DEPARTMENT_OTHER): Payer: Self-pay | Admitting: Cardiology

## 2024-02-23 ENCOUNTER — Ambulatory Visit (HOSPITAL_BASED_OUTPATIENT_CLINIC_OR_DEPARTMENT_OTHER): Payer: HMO | Admitting: Cardiology

## 2024-02-23 VITALS — BP 132/74 | HR 52 | Ht 63.0 in | Wt 144.9 lb

## 2024-02-23 DIAGNOSIS — M791 Myalgia, unspecified site: Secondary | ICD-10-CM

## 2024-02-23 DIAGNOSIS — T466X5D Adverse effect of antihyperlipidemic and antiarteriosclerotic drugs, subsequent encounter: Secondary | ICD-10-CM

## 2024-02-23 DIAGNOSIS — E7800 Pure hypercholesterolemia, unspecified: Secondary | ICD-10-CM

## 2024-02-23 DIAGNOSIS — Z7189 Other specified counseling: Secondary | ICD-10-CM

## 2024-02-23 DIAGNOSIS — I10 Essential (primary) hypertension: Secondary | ICD-10-CM

## 2024-02-23 DIAGNOSIS — R002 Palpitations: Secondary | ICD-10-CM

## 2024-02-23 DIAGNOSIS — T466X5A Adverse effect of antihyperlipidemic and antiarteriosclerotic drugs, initial encounter: Secondary | ICD-10-CM

## 2024-02-23 DIAGNOSIS — I2510 Atherosclerotic heart disease of native coronary artery without angina pectoris: Secondary | ICD-10-CM

## 2024-02-23 MED ORDER — POTASSIUM CHLORIDE CRYS ER 20 MEQ PO TBCR: 20.0000 meq | EXTENDED_RELEASE_TABLET | Freq: Every day | ORAL | 3 refills | Status: DC

## 2024-02-23 MED ORDER — METOPROLOL SUCCINATE ER 50 MG PO TB24: 50.0000 mg | ORAL_TABLET | Freq: Every day | ORAL | 3 refills | Status: AC

## 2024-02-23 MED ORDER — CLOPIDOGREL BISULFATE 75 MG PO TABS: 75.0000 mg | ORAL_TABLET | Freq: Every day | ORAL | 3 refills | Status: AC

## 2024-02-23 MED ORDER — IRBESARTAN-HYDROCHLOROTHIAZIDE 300-12.5 MG PO TABS: 1.0000 | ORAL_TABLET | Freq: Every day | ORAL | 3 refills | Status: DC

## 2024-02-23 MED ORDER — AMLODIPINE BESYLATE 5 MG PO TABS: 5.0000 mg | ORAL_TABLET | Freq: Every day | ORAL | 3 refills | Status: AC

## 2024-02-23 NOTE — Progress Notes (Signed)
 Cardiology Office Note:  .   Date:  02/23/2024  ID:  Erika Brown, DOB 1941-02-23, MRN [id number] PCP: [place], Betty G, MD  [facility] Providers Cardiologist:  Jodelle Red, MD {  History of Present Illness: .   Erika Brown is a 83 y.o. female with PMH CAD s/p PCI 2006 in setting of NSTEMI, hypertension, hyperlipidemia, breast cancer. She was previously followed by Dr. Okey Dupre and Dr. Eldridge Dace and established care with me on 02/23/24.  Pertinent CV history: NSTEMI 2006 with DES to LAD and DES to RCA, balloon to diag. Cath 2019 with ISR of LAD and severe stenosis in Lcx and RCA, with multivessel stenting with DES to p-mid LAD, DES to mid Lcx, and DES to prox-mid RCA. Echo 2020 with EF 60-65%, mild AR, normal RV, normal pressures.  Today: Overall doing well from a cardiac perspective. Dealing with spring allergies. Has chronic palpitations, stable.   She eats very well, very little meat. Lots of vegetables, beans, nuts. Stays active, loves to garden. Can walk without limitations, up/down hills, takes stairs multiple times throughout the day in her house.  BP at home typically runs in the 120s at home.   Has a large family history of heart disease. Discussed lp(a) today.  No bleeding issues on aspirin/clopidigrel, occasional bruising.  ROS: Denies chest pain, shortness of breath at rest or with normal exertion. No PND, orthopnea, LE edema or unexpected weight gain. No syncope. ROS otherwise negative except as noted.   Studies Reviewed: [name]    EKG:  EKG Interpretation Date/Time:  Thursday February 23 2024 08:52:37 EDT Ventricular Rate:  100 PR Interval:  196 QRS Duration:  66 QT Interval:  320 QTC Calculation: 412 R Axis:   44  Text Interpretation: Normal sinus rhythm Septal infarct , age undetermined Confirmed by Jodelle Red [phone]) on 02/23/2024 8:59:56 AM    Physical Exam:   VS:  BP 132/74   Pulse (!) 52   Ht 5\' 3"  (1.6 m)   Wt 144 lb 14.4 oz (65.7  kg)   LMP 11/29/1974   SpO2 98%   BMI 25.67 kg/m    Wt Readings from Last 3 Encounters:  02/23/24 144 lb 14.4 oz (65.7 kg)  01/06/24 145 lb 6 oz (65.9 kg)  12/28/23 143 lb (64.9 kg)    GEN: Well nourished, well developed in no acute distress HEENT: Normal, moist mucous membranes NECK: No JVD CARDIAC: regular rhythm with occasional early beats, normal S1 and S2, no rubs or gallops. No murmur. VASCULAR: Radial and DP pulses 2+ bilaterally. No carotid bruits RESPIRATORY:  Clear to auscultation without rales, wheezing or rhonchi  ABDOMEN: Soft, non-tender, non-distended MUSCULOSKELETAL:  Ambulates independently SKIN: Warm and dry, no edema NEUROLOGIC:  Alert and oriented x 3. No focal neuro deficits noted. PSYCHIATRIC:  Normal affect    ASSESSMENT AND PLAN: .    CAD with multivessel stenting History of NSTEMI Hypercholesterolemia Statin intolerance due to statin myalgia, ezetimibe intolerance -has discussed PCSK9i and non statin therapies with pharmacy team, has declined all cholesterol lowering therapies. She understands the risk associated with this.  -discussed lp(a), after shared decision making (lack of therapy/intolerance of meds), will not pursue at this time -lipids 01/06/24 with Tchol 222, TG 87, HDL 78, LDL 127. LDL goal <70. -continue aspirin 81 mg daily, clopidogrel 75 mg daily given history of in stent restenosis/multivessel stenting. If bleeding issues occur, drop aspirin -amlodipine, metoprolol as anti anginals -reviewed red flag warning signs that need  immediate medical attention  Hypertension -continue amlodipine 5 mg daily, irbesartan-hydrochlorothiazide 300-12.5 mg daily, metoprolol succinate 50 mg daily  Palpitations PACs and PVCs on prior monitor -reviewed monitor from 2020, no sustained arrhythmias -no red flag symptoms -chronic, stable  CV risk counseling and prevention -recommend heart healthy/Mediterranean diet, with whole grains, fruits, vegetable,  fish, lean meats, nuts, and olive oil. Limit salt. -recommend moderate walking, 3-5 times/week for 30-50 minutes each session. Aim for at least 150 minutes.week. Goal should be pace of 3 miles/hours, or walking 1.5 miles in 30 minutes -recommend avoidance of tobacco products. Avoid excess alcohol.  Dispo: 1 year or sooner as needed  Signed, Jodelle Red, MD   Jodelle Red, MD, PhD, [facility]  [facility]  [place]  [facility] at [facility] [address], [address] [place], [place] [address] [phone]

## 2024-02-23 NOTE — Patient Instructions (Signed)
 Medication Instructions:  Your physician recommends that you continue on your current medications as directed. Please refer to the Current Medication list given to you today.  *If you need a refill on your cardiac medications before your next appointment, please call your pharmacy*   Follow-Up: At [facility], you and your health needs are our priority.  As part of our continuing mission to provide you with exceptional heart care, we have created designated Provider Care Teams.  These Care Teams include your primary Cardiologist (physician) and Advanced Practice Providers (APPs -  Physician Assistants and Nurse Practitioners) who all work together to provide you with the care you need, when you need it.  We recommend signing up for the patient portal called "MyChart".  Sign up information is provided on this After Visit Summary.  MyChart is used to connect with patients for Virtual Visits (Telemedicine).  Patients are able to view lab/test results, encounter notes, upcoming appointments, etc.  Non-urgent messages can be sent to your provider as well.   To learn more about what you can do with MyChart, go to [email or web address].     Your next appointment:   12 month(s)  Provider:   Jodelle Red, MD

## 2024-03-02 ENCOUNTER — Other Ambulatory Visit: Payer: Self-pay | Admitting: Family Medicine

## 2024-03-02 DIAGNOSIS — Z1231 Encounter for screening mammogram for malignant neoplasm of breast: Secondary | ICD-10-CM

## 2024-03-07 ENCOUNTER — Ambulatory Visit (INDEPENDENT_AMBULATORY_CARE_PROVIDER_SITE_OTHER): Admitting: Family Medicine

## 2024-03-07 ENCOUNTER — Encounter: Payer: Self-pay | Admitting: Family Medicine

## 2024-03-07 VITALS — BP 142/82 | HR 72 | Temp 97.3°F | Wt 143.8 lb

## 2024-03-07 DIAGNOSIS — R0683 Snoring: Secondary | ICD-10-CM | POA: Diagnosis not present

## 2024-03-07 DIAGNOSIS — R400 Somnolence: Secondary | ICD-10-CM

## 2024-03-07 NOTE — Progress Notes (Signed)
 Established Patient Office Visit  Subjective   Patient ID: Erika Brown, female    DOB: 05-Jul-1941  Age: 83 y.o. MRN: [id number]  Chief Complaint  Patient presents with   Snoring    HPI   Erika Brown is seen today as a work in basically to discuss possible home sleep study.  She has history of CAD, hypertension, hyperlipidemia, history of breast cancer.  She states she has a history of snoring.  One of her daughters slept with her recently and noticed increased snoring.  Patient also states that sometimes she is aware of waking up at night with some difficulty breathing.  No history of asthma.  No chronic cough.  No recent chest pains.  Frequently takes a daytime nap.  Has recently had some daytime somnolence but generally not prone to falling asleep when she is engaged with activities.  She states her husband had had sleep apnea and wore CPAP for some time.  Patient does wakes up frequently with dry mouth.  Past Medical History:  Diagnosis Date   Anginal pain (HCC)    Anxiety and depression    Breast cancer (HCC)    Breast cancer (HCC)    CAD (coronary artery disease)    S/P NSTEMI 4/06 EF 60%; Treated with Cypher DES to LAD and RCA with kissing balloon PCI diagonal; Treadmill myoview 10/09 for CP: Walked 7:06. mild ST-T-wave changes in recovery. EF 71%. Very mild reversible defect in base of the inferior wall --> medical rx.   Cancer (HCC)    HISTORY OF SKIN CANCER   Cataract    Complication of anesthesia    " DIFFICULT TO WAKE UP "   Depression    Family history of breast cancer 04/01/2021   Family history of colon cancer 04/01/2021   Family history of malignant melanoma 04/01/2021   Glucose intolerance (impaired glucose tolerance)    History of echocardiogram    Echo 11/17: Vigorous LVF, EF 65-70, normal wall motion, borderline diastolic function, systolic bowing of mitral valve without prolapse, mild MR, trivial TR, PASP 20   History of medication noncompliance     Hyperlipidemia    Intolerate to multiple statins due to severe myalgias   Hypertension    Possible white-coat component   MI (myocardial infarction) (HCC)    acute-s/p stent   Microhematuria    Pneumonia due to COVID-19 virus    Psoriasis    Rapid heart beat    Vitamin D deficiency    Past Surgical History:  Procedure Laterality Date   ABDOMINAL HYSTERECTOMY     and anterior repair   APPENDECTOMY     bladder tact     BREAST BIOPSY     BREAST LUMPECTOMY Right 05/21/2021   BREAST LUMPECTOMY WITH RADIOACTIVE SEED LOCALIZATION Left 05/21/2021   Procedure: LEFT BREAST LUMPECTOMY WITH RADIOACTIVE SEED LOCALIZATION;  Surgeon: Emelia Loron, MD;  Location: [facility];  Service: General;  Laterality: Left;   CARDIAC CATHETERIZATION  02/28/2018   CATARACT EXTRACTION Bilateral 2017   Dr. Dione Booze   CORONARY STENT INTERVENTION N/A 02/28/2018   Procedure: CORONARY STENT INTERVENTION;  Surgeon: Corky Crafts, MD;  Location: [facility];  Service: Cardiovascular;  Laterality: N/A;   CORONARY STENT PLACEMENT     CORONARY STENT PLACEMENT  04/02/019   EYE SURGERY Bilateral    cataract   HEMORRHOID SURGERY     LEFT HEART CATH AND CORONARY ANGIOGRAPHY N/A 02/28/2018   Procedure: LEFT HEART CATH AND CORONARY ANGIOGRAPHY;  Surgeon: Corky Crafts, MD;  Location: [facility];  Service: Cardiovascular;  Laterality: N/A;   MOHS SURGERY  07/2013   BCC.  [facility]   RECTAL PROLAPSE REPAIR  1976   TONSILLECTOMY AND ADENOIDECTOMY     ULTRASOUND GUIDANCE FOR VASCULAR ACCESS  02/28/2018   Procedure: Ultrasound Guidance For Vascular Access;  Surgeon: Corky Crafts, MD;  Location: [facility] IN[facility] CV [facility];  Service: Cardiovascular;;    reports that she has never smoked. She has never used smokeless tobacco. She reports current alcohol use of about 4.0 standard drinks of alcohol per week. She reports that she does not use drugs. family history includes Asthma in her brother; Breast  cancer (age of onset: 33) in her daughter; Breast cancer (age of onset: 39) in her sister; Colon cancer (age of onset: 50) in her son; Depression in her daughter; Diabetes in her paternal grandmother; Heart attack in her father; Heart disease in her father; Hypertension in her brother, maternal grandfather, maternal grandmother, and mother; Lung cancer in her sister; Melanoma in her daughter and mother; Renal Disease in her maternal grandfather; Stroke in her maternal grandfather and maternal grandmother; Ulcers in her father. Allergies  Allergen Reactions   Iodine Shortness Of Breath and Palpitations    Has had some SHOB and rapid heart rate in the past   Latex Other (See Comments)    discomfort   Other Other (See Comments)    Some foods b/c of preservatives    Statins Other (See Comments)    Intolerance to high dosages, myalgia    Review of Systems  Constitutional:  Positive for malaise/fatigue.  Eyes:  Negative for blurred vision.  Respiratory:  Negative for cough and shortness of breath.   Cardiovascular:  Negative for chest pain, orthopnea and leg swelling.  Neurological:  Negative for dizziness, weakness and headaches.      Objective:     BP (!) 142/82 (BP Location: Left Arm, Patient Position: Sitting, Cuff Size: Normal)   Pulse 72   Temp (!) 97.3 F (36.3 C) (Oral)   Wt 143 lb 12.8 oz (65.2 kg)   LMP 11/29/1974   SpO2 97%   BMI 25.47 kg/m  BP Readings from Last 3 Encounters:  03/07/24 (!) 142/82  02/23/24 132/74  01/06/24 126/70   Wt Readings from Last 3 Encounters:  03/07/24 143 lb 12.8 oz (65.2 kg)  02/23/24 144 lb 14.4 oz (65.7 kg)  01/06/24 145 lb 6 oz (65.9 kg)      Physical Exam Vitals reviewed.  Constitutional:      General: She is not in acute distress.    Appearance: She is well-developed.  HENT:     Mouth/Throat:     Pharynx: Oropharynx is clear.  Eyes:     Pupils: Pupils are equal, round, and reactive to light.  Neck:     Thyroid: No  thyromegaly.     Vascular: No JVD.     Comments: No neck masses palpated. Cardiovascular:     Rate and Rhythm: Normal rate and regular rhythm.     Heart sounds:     No gallop.  Pulmonary:     Effort: Pulmonary effort is normal. No respiratory distress.     Breath sounds: Normal breath sounds. No wheezing or rales.  Musculoskeletal:     Cervical back: Neck supple.     Right lower leg: No edema.     Left lower leg: No edema.  Lymphadenopathy:     Cervical:  No cervical adenopathy.  Neurological:     General: No focal deficit present.     Mental Status: She is alert.      No results found for any visits on 03/07/24.  Last CBC Lab Results  Component Value Date   WBC 6.5 05/19/2021   HGB 12.7 05/19/2021   HCT 40.0 05/19/2021   MCV 87.1 05/19/2021   MCH 27.7 05/19/2021   RDW 13.8 05/19/2021   PLT 213 05/19/2021   Last metabolic panel Lab Results  Component Value Date   GLUCOSE 99 01/06/2024   NA 141 01/06/2024   K 4.0 01/06/2024   CL 104 01/06/2024   CO2 26 01/06/2024   BUN 22 01/06/2024   CREATININE 0.84 01/06/2024   GFR 63.98 01/06/2024   CALCIUM 9.8 01/06/2024   PROT 7.0 01/06/2024   ALBUMIN 4.1 01/06/2024   BILITOT 0.5 01/06/2024   ALKPHOS 81 01/06/2024   AST 17 01/06/2024   ALT 17 01/06/2024   ANIONGAP 8 05/19/2021   Last lipids Lab Results  Component Value Date   CHOL 222 (H) 01/06/2024   HDL 78.00 01/06/2024   LDLCALC 127 (H) 01/06/2024   LDLDIRECT 121.7 09/08/2007   TRIG 87.0 01/06/2024   CHOLHDL 3 01/06/2024   Last thyroid functions Lab Results  Component Value Date   TSH 1.56 01/06/2024      The ASCVD Risk score (Arnett DK, et al., 2019) failed to calculate for the following reasons:   The 2019 ASCVD risk score is only valid for ages 56 to 63   Risk score cannot be calculated because patient has a medical history suggesting prior/existing ASCVD    Assessment & Plan:   Problem List Items Addressed This Visit   None Visit Diagnoses        Snoring    -  Primary   Relevant Orders   Home sleep test     Daytime somnolence       Relevant Orders   Home sleep test     Patient concerned about possible obstructive sleep apnea.  History of snoring, occasional difficulty breathing at night, intermittent daytime somnolence.  Epworth sleep scale score of only 4 but patient would like to be studied to rule out obstructive sleep apnea.  -Set up home sleep test to further evaluate  No follow-ups on file.    Evelena Peat, MD

## 2024-03-28 DIAGNOSIS — L821 Other seborrheic keratosis: Secondary | ICD-10-CM | POA: Diagnosis not present

## 2024-03-28 DIAGNOSIS — Z85828 Personal history of other malignant neoplasm of skin: Secondary | ICD-10-CM | POA: Diagnosis not present

## 2024-03-28 DIAGNOSIS — L738 Other specified follicular disorders: Secondary | ICD-10-CM | POA: Diagnosis not present

## 2024-03-28 DIAGNOSIS — D1801 Hemangioma of skin and subcutaneous tissue: Secondary | ICD-10-CM | POA: Diagnosis not present

## 2024-03-28 DIAGNOSIS — L570 Actinic keratosis: Secondary | ICD-10-CM | POA: Diagnosis not present

## 2024-03-28 DIAGNOSIS — D225 Melanocytic nevi of trunk: Secondary | ICD-10-CM | POA: Diagnosis not present

## 2024-03-28 DIAGNOSIS — L814 Other melanin hyperpigmentation: Secondary | ICD-10-CM | POA: Diagnosis not present

## 2024-04-04 ENCOUNTER — Ambulatory Visit
Admission: RE | Admit: 2024-04-04 | Discharge: 2024-04-04 | Disposition: A | Discharge status: Home / Self Care | Source: Ambulatory Visit | Attending: Family Medicine | Admitting: Family Medicine

## 2024-04-04 DIAGNOSIS — Z1231 Encounter for screening mammogram for malignant neoplasm of breast: Secondary | ICD-10-CM | POA: Diagnosis not present

## 2024-04-05 ENCOUNTER — Ambulatory Visit (INDEPENDENT_AMBULATORY_CARE_PROVIDER_SITE_OTHER): Admitting: Audiology

## 2024-04-05 ENCOUNTER — Institutional Professional Consult (permissible substitution) (INDEPENDENT_AMBULATORY_CARE_PROVIDER_SITE_OTHER): Admitting: Otolaryngology

## 2024-04-05 DIAGNOSIS — H903 Sensorineural hearing loss, bilateral: Secondary | ICD-10-CM | POA: Diagnosis not present

## 2024-04-06 ENCOUNTER — Telehealth (INDEPENDENT_AMBULATORY_CARE_PROVIDER_SITE_OTHER): Payer: Self-pay | Admitting: Otolaryngology

## 2024-04-06 ENCOUNTER — Other Ambulatory Visit (INDEPENDENT_AMBULATORY_CARE_PROVIDER_SITE_OTHER): Payer: Self-pay | Admitting: Otolaryngology

## 2024-04-06 MED ORDER — PREDNISONE 10 MG (21) PO TBPK: ORAL_TABLET | ORAL | 0 refills | Status: DC

## 2024-04-06 NOTE — Telephone Encounter (Signed)
 Patient called yesterday stating she had a bad case of vertigo and was unable to hear. She was work in for a same day audiogram. I called her back today and told her that her hearing slightly worse side per dr. Darlin Ehrlich. Per Dr. Darlin Ehrlich Meclizine  25 mg one po Q 6 hrs for dizziness. Prednisone  Dose pack for 6 days also sent to her pharmacy. She will follow up 04/16/24 with a repeat audiogram.

## 2024-04-09 NOTE — Progress Notes (Signed)
  [address] [place], [place] [address] [phone]  Audiological Evaluation    Name: Erika Brown     DOB:   [birth year]      MRN:   [id number]                                                                                     Service Date: 04/05/2024         Patient comes today after Dr. Darlin Ehrlich requested a hearing evaluation due to patient calling with concerns of left sudden hearing loss and vertigo.     Symptoms Yes Details  Hearing loss  [x]  Reports cannot hear from the left ear since - recent onset.. Previous audiogram was completed at [facility]. [facility] on 05-23-23.  Tinnitus  []    Ear pain/ infections/pressure  []    Balance problems  [x]  Reports had a spinning spell that lasted hours  Noise exposure history  []    Previous ear surgeries  []    Family history of hearing loss  []    Amplification  []    Other  [x]  Noted during hearing test hypersensitivity to sounds in the left ear.    Otoscopy: Right ear: Clear external ear canals and notable landmarks visualized on the tympanic membrane. Left ear:  Clear external ear canals and notable landmarks visualized on the tympanic membrane.  Tympanometry: Right ear: Type A- Normal external ear canal volume with normal middle ear pressure and tympanic membrane compliance. Left ear: Type A- Normal external ear canal volume with normal middle ear pressure and tympanic membrane compliance.   Pure tone Audiometry: Right ear- Borderline normal to moderately severe sensorineural hearing loss from 125 Hz - 8000 Hz. Left ear-  Mild to severe sensorineural hearing loss from 125 Hz - 8000 Hz.  Speech Audiometry: Right ear- Speech Reception Threshold (SRT) was obtained at 35 dBHL. Left ear-Speech Reception Threshold (SRT) was obtained at 55 dBHL.   Word Recognition Score Tested using NU-6 (MLV) Right ear: 68% was obtained at a presentation level of 70 dBHL with contralateral masking which is deemed as  poor. Left ear: 72%  was obtained at a presentation level of 75 dBHL with contralateral masking which is deemed as  fair.  Note- Left hearing asymmetry noted. Only a 10dBHL decline noted at 500 Hz in the left ear when compared to her previous audiogram from 05-23-23.  The hearing test results were completed under inserts and results are deemed to be of good to fair reliability. Test technique:  conventional    Recommendations: Follow up with ENT as scheduled for today. Return for a hearing evaluation if concerns with hearing changes arise or per MD recommendation. Consider a communication needs assessment after medical clearance for hearing aids is obtained.   Raymundo Rout MARIE LEROUX-MARTINEZ, AUD

## 2024-04-16 ENCOUNTER — Ambulatory Visit (INDEPENDENT_AMBULATORY_CARE_PROVIDER_SITE_OTHER): Admitting: Otolaryngology

## 2024-04-16 ENCOUNTER — Encounter: Payer: Self-pay | Admitting: Audiology

## 2024-04-16 ENCOUNTER — Ambulatory Visit (INDEPENDENT_AMBULATORY_CARE_PROVIDER_SITE_OTHER): Admitting: Audiology

## 2024-04-16 ENCOUNTER — Encounter (INDEPENDENT_AMBULATORY_CARE_PROVIDER_SITE_OTHER): Payer: Self-pay | Admitting: Otolaryngology

## 2024-04-16 VITALS — BP 129/74 | HR 83

## 2024-04-16 DIAGNOSIS — R42 Dizziness and giddiness: Secondary | ICD-10-CM | POA: Diagnosis not present

## 2024-04-16 DIAGNOSIS — H903 Sensorineural hearing loss, bilateral: Secondary | ICD-10-CM | POA: Diagnosis not present

## 2024-04-16 DIAGNOSIS — H9042 Sensorineural hearing loss, unilateral, left ear, with unrestricted hearing on the contralateral side: Secondary | ICD-10-CM

## 2024-04-16 NOTE — Progress Notes (Signed)
 Patient ID: Erika Brown, female   DOB: 11/17/1941, 83 y.o.   MRN: [id number]  Follow-up: Acute onset dizziness, progressive left ear hearing loss  HPI: The patient is an 83 year old female who returns today for her follow-up evaluation.  The patient contacted our office 1 week ago, complaining of sudden onset vertigo and left ear hearing loss.  She described her dizziness as a spinning vertigo the last for several minutes.  It was accompanied by nausea, vomiting, and decreased left ear hearing.  Her hearing test showed progressive asymmetric left ear sensorineural hearing loss.  She was treated with meclizine  and prednisone .  The patient returns today reporting improvement in her dizziness.  However, she is still having difficulty hearing on the left side.  She currently denies any otalgia, otorrhea, or vertigo.  The patient has no recent brain MRI scan.  Exam: General: Communicates without difficulty, well nourished, no acute distress. Head: Normocephalic, no evidence injury, no tenderness, facial buttresses intact without stepoff. Face/sinus: No tenderness to palpation and percussion. Facial movement is normal and symmetric. Eyes: PERRL, EOMI. No scleral icterus, conjunctivae clear. Neuro: CN II exam reveals vision grossly intact.  No nystagmus at any point of gaze. Ears: Auricles well formed without lesions.  Ear canals are intact without mass or lesion.  No erythema or edema is appreciated.  The TMs are intact without fluid. Nose: External evaluation reveals normal support and skin without lesions.  Dorsum is intact.  Anterior rhinoscopy reveals normal mucosa over anterior aspect of inferior turbinates and intact septum.  No purulence noted. Oral:  Oral cavity and oropharynx are intact, symmetric, without erythema or edema.  Mucosa is moist without lesions. Neck: Full range of motion without pain.  There is no significant lymphadenopathy.  No masses palpable.  Thyroid  bed within normal limits to  palpation.  Parotid glands and submandibular glands equal bilaterally without mass.  Trachea is midline. Neuro:  CN 2-12 grossly intact. Vestibular: No nystagmus at any point of gaze. Dix Hallpike negative.  Vestibular: There is no nystagmus with pneumatic pressure on either tympanic membrane or Valsalva. The cerebellar examination is unremarkable.   Her hearing test shows progressive asymmetric left ear sensorineural hearing loss.  She continues to have right ear high-frequency sensorineural hearing loss.  Assessment: 1.  Recurrent dizziness, with spinning vertigo, nausea, and vomiting.  The severity of her dizziness has decreased after treatment with prednisone . 2.  Progressive asymmetric left ear low-frequency sensorineural hearing loss. 3.  Her symptom complex is suggestive of left ear Mnire's disease.  Other possible differential diagnoses include transient BPPV, vestibular migraine,  peripheral vestibular dysfunction, or other central/systemic causes.    Plan: 1.  The physical exam findings and the hearing test results are reviewed with the patient. 2.  The pathophysiology of vestibular dysfunction and dizziness are discussed extensively with the patient. The possible differential diagnoses are reviewed. Questions are invited and answered.   3.  MRI scan with gadolinium contrast to evaluate for possible retrocochlear lesion. 4.  The patient is encouraged to start a low-salt diet. 5.  The patient will return for reevaluation after the MRI scan.

## 2024-04-16 NOTE — Progress Notes (Signed)
  [address], [address] [place], [place] [address] [phone]  Audiological Evaluation    Name: Erika Brown     DOB:   [birth year]      MRN:   [id number]                                                                                     Service Date: 04/16/2024     Accompanied by: unaccompanied    Patient comes today after Dr. Darlin Ehrlich, ENT sent a referral for a hearing evaluation due to concerns with hearing loss.   Symptoms Yes Details  Hearing loss  [x]  Follow up after concerns of left side sudden hearing loss. Patient has not noticed hearing has improved.  Of note, it seems that over the years she has always had a hearing asymmetry ( worse in the left ear ). Also, the left ear thresholds may have been slowly declining.   Tinnitus  []    Ear pain/ infections/pressure  []    Balance problems  []  No more vertigo was reported.  Noise exposure history  []    Previous ear surgeries  []    Family history of hearing loss  []    Amplification  []    Other  [x]  Continues to have hypersensivity to sounds- worse in the left ear.    Otoscopy: Right ear: Clear external ear canals and notable landmarks visualized on the tympanic membrane. Left ear:  Clear external ear canals and notable landmarks visualized on the tympanic membrane.  Tympanometry: Right ear: Type A- Normal external ear canal volume with normal middle ear pressure and tympanic membrane compliance. Left ear: Type A- Normal external ear canal volume with normal middle ear pressure and tympanic membrane compliance.    Pure tone Audiometry: Right ear- Normal to moderately severe sensorineural hearing loss from 125 Hz - 8000 Hz. Left ear-  Moderate to moderately severe sensorineural hearing loss from 125 Hz - 8000 Hz. Of note, left asymmetry continues to be observed. No significant improvements noted when compared to her previous audiogram from 04-05-2024.  Speech Audiometry: Right ear- Speech Reception Threshold (SRT) was  obtained at 35 dBHL. Left ear-Speech Reception Threshold (SRT) was obtained at 55 dBHL.   Word Recognition Score Tested using NU-6 (MLV) Right ear: 72% was obtained at a presentation level of 70 dBHL(MCL)  with contralateral masking which is deemed as  fair. Left ear: 60% was obtained at a presentation level of 75 dBHL ( reported MCL)  with contralateral masking which is deemed as  poor.   The hearing test results were completed under headphones and results are deemed to be of good to fair reliability. Test technique:  conventional      Recommendations: Follow up with ENT as scheduled for today. Return for a hearing evaluation if concerns with hearing changes arise or per MD recommendation. Recommend hearing aid evaluation, after medical clearance is obtained.   Ysabel Stankovich MARIE LEROUX-MARTINEZ, AUD

## 2024-04-17 ENCOUNTER — Ambulatory Visit (HOSPITAL_COMMUNITY)
Admission: RE | Admit: 2024-04-17 | Discharge: 2024-04-17 | Disposition: A | Discharge status: Home / Self Care | Source: Ambulatory Visit | Attending: Otolaryngology | Admitting: Otolaryngology

## 2024-04-17 DIAGNOSIS — G319 Degenerative disease of nervous system, unspecified: Secondary | ICD-10-CM | POA: Diagnosis not present

## 2024-04-17 DIAGNOSIS — J341 Cyst and mucocele of nose and nasal sinus: Secondary | ICD-10-CM | POA: Diagnosis not present

## 2024-04-17 DIAGNOSIS — H9190 Unspecified hearing loss, unspecified ear: Secondary | ICD-10-CM | POA: Diagnosis not present

## 2024-04-17 DIAGNOSIS — H903 Sensorineural hearing loss, bilateral: Secondary | ICD-10-CM | POA: Diagnosis not present

## 2024-04-17 MED ORDER — GADOBUTROL 1 MMOL/ML IV SOLN
6.5000 mL | Freq: Once | INTRAVENOUS | Status: AC | PRN
  Administered 2024-04-17: 6.5 mL via INTRAVENOUS

## 2024-04-18 DIAGNOSIS — L570 Actinic keratosis: Secondary | ICD-10-CM | POA: Diagnosis not present

## 2024-04-18 DIAGNOSIS — L821 Other seborrheic keratosis: Secondary | ICD-10-CM | POA: Diagnosis not present

## 2024-04-18 DIAGNOSIS — Z85828 Personal history of other malignant neoplasm of skin: Secondary | ICD-10-CM | POA: Diagnosis not present

## 2024-04-24 ENCOUNTER — Encounter: Payer: Self-pay | Admitting: Audiology

## 2024-05-10 ENCOUNTER — Ambulatory Visit (INDEPENDENT_AMBULATORY_CARE_PROVIDER_SITE_OTHER): Payer: HMO | Admitting: Otolaryngology

## 2024-05-10 ENCOUNTER — Encounter (INDEPENDENT_AMBULATORY_CARE_PROVIDER_SITE_OTHER): Payer: Self-pay | Admitting: Otolaryngology

## 2024-05-10 VITALS — BP 122/80 | HR 88 | Ht 63.5 in | Wt 145.0 lb

## 2024-05-10 DIAGNOSIS — H9042 Sensorineural hearing loss, unilateral, left ear, with unrestricted hearing on the contralateral side: Secondary | ICD-10-CM | POA: Diagnosis not present

## 2024-05-10 DIAGNOSIS — R42 Dizziness and giddiness: Secondary | ICD-10-CM

## 2024-05-12 DIAGNOSIS — H9042 Sensorineural hearing loss, unilateral, left ear, with unrestricted hearing on the contralateral side: Secondary | ICD-10-CM | POA: Insufficient documentation

## 2024-05-12 NOTE — Progress Notes (Signed)
 Patient ID: Erika Brown, female   DOB: [birth year], 83 y.o.   MRN: [id number]  Follow-up: Recurrent dizziness, asymmetric left ear hearing loss  HPI: The patient is an 83 year old female who returns today for her follow-up evaluation.  She was last seen in May 2025.  At that time, she was experiencing recurrent dizziness and progressive asymmetric left ear sensorineural hearing loss.  She was treated with prednisone  and meclizine .  She was diagnosed with left ear Mnire's disease.  A subsequent MRI scan was negative for retrocochlear lesion.  The patient returns today reporting resolution of her dizziness.  She still has asymmetric left ear hearing loss.  Currently she denies any otalgia, otorrhea, or vertigo.  Exam: General: Communicates without difficulty, well nourished, no acute distress. Head: Normocephalic, no evidence injury, no tenderness, facial buttresses intact without stepoff. Face/sinus: No tenderness to palpation and percussion. Facial movement is normal and symmetric. Eyes: PERRL, EOMI. No scleral icterus, conjunctivae clear. Neuro: CN II exam reveals vision grossly intact.  No nystagmus at any point of gaze. Ears: Auricles well formed without lesions.  Ear canals are intact without mass or lesion.  No erythema or edema is appreciated.  The TMs are intact without fluid. Nose: External evaluation reveals normal support and skin without lesions.  Dorsum is intact.  Anterior rhinoscopy reveals normal mucosa over anterior aspect of inferior turbinates and intact septum.  No purulence noted. Oral:  Oral cavity and oropharynx are intact, symmetric, without erythema or edema.  Mucosa is moist without lesions. Neck: Full range of motion without pain.  There is no significant lymphadenopathy.  No masses palpable.  Thyroid  bed within normal limits to palpation.  Parotid glands and submandibular glands equal bilaterally without mass.  Trachea is midline. Neuro:  CN 2-12 grossly intact. Vestibular: No  nystagmus at any point of gaze. Dix Hallpike negative.  Vestibular: There is no nystagmus with pneumatic pressure on either tympanic membrane or Valsalva. The cerebellar examination is unremarkable.   Assessment: 1.  The patient's recurrent dizziness is currently under control.  No recent spinning vertigo was noted. 2.  Subjectively stable asymmetric left ear low-frequency sensorineural hearing loss. 3.  Recurrent dizziness is likely a result of left ear Mnire's disease. Other possible differential diagnoses include transient BPPV, vestibular migraine,  peripheral vestibular dysfunction, or other central/systemic causes.    Plan: 1.  The physical exam findings and the MRI results are reviewed with the patient. 2.  The pathophysiology of Mnire's disease is extensively discussed with the patient. 3.  Continue the 1500 mg low-salt diet. 4.  The patient is a candidate for hearing amplification.  The hearing aid options are discussed. 5.  The patient will return for reevaluation in 6 months, sooner if needed.

## 2024-06-13 ENCOUNTER — Encounter: Payer: Self-pay | Admitting: Family Medicine

## 2024-06-15 ENCOUNTER — Telehealth (INDEPENDENT_AMBULATORY_CARE_PROVIDER_SITE_OTHER): Payer: Self-pay | Admitting: Otolaryngology

## 2024-06-15 NOTE — Telephone Encounter (Signed)
 Patient called and has had 2 episodes of vertigo since her last visit in June 2025 - one on 05/30/2023 and another on 06/11/2024.  She would like a referral for therapy for her vertigo.  Please call her at [phone] and let her know if you can put in a referral for her.

## 2024-06-18 ENCOUNTER — Other Ambulatory Visit (INDEPENDENT_AMBULATORY_CARE_PROVIDER_SITE_OTHER): Payer: Self-pay | Admitting: Otolaryngology

## 2024-06-18 DIAGNOSIS — R42 Dizziness and giddiness: Secondary | ICD-10-CM

## 2024-06-28 NOTE — Therapy (Signed)
 OUTPATIENT PHYSICAL THERAPY VESTIBULAR EVALUATION     Patient Name: Erika Brown MRN: [id number] DOB:[birth year], 83 y.o., female Today's Date: 06/29/2024  END OF SESSION:  PT End of Session - 06/29/24 1150     Visit Number 1    Number of Visits 13    Date for PT Re-Evaluation 08/10/24    Authorization Type [employer]    PT Start Time 1102    PT Stop Time 1144    PT Time Calculation (min) 42 min    Activity Tolerance Patient tolerated treatment well    Behavior During Therapy WFL for tasks assessed/performed          Past Medical History:  Diagnosis Date   Anginal pain (HCC)    Anxiety and depression    Breast cancer (HCC)    Breast cancer (HCC)    CAD (coronary artery disease)    S/P NSTEMI 4/06 EF 60%; Treated with Cypher DES to LAD and RCA with kissing balloon PCI diagonal; Treadmill myoview  10/09 for CP: Walked 7:06. mild ST-T-wave changes in recovery. EF 71%. Very mild reversible defect in base of the inferior wall --> medical rx.   Cancer (HCC)    HISTORY OF SKIN CANCER   Cataract    Complication of anesthesia     DIFFICULT TO WAKE UP    Depression    Family history of breast cancer 04/01/2021   Family history of colon cancer 04/01/2021   Family history of malignant melanoma 04/01/2021   Glucose intolerance (impaired glucose tolerance)    History of echocardiogram    Echo 11/17: Vigorous LVF, EF 65-70, normal wall motion, borderline diastolic function, systolic bowing of mitral valve without prolapse, mild MR, trivial TR, PASP 20   History of medication noncompliance    Hyperlipidemia    Intolerate to multiple statins due to severe myalgias   Hypertension    Possible white-coat component   MI (myocardial infarction) (HCC)    acute-s/p stent   Microhematuria    Pneumonia due to COVID-19 virus    Psoriasis    Rapid heart beat    Vitamin D  deficiency    Past Surgical History:  Procedure Laterality Date   ABDOMINAL HYSTERECTOMY     and anterior  repair   APPENDECTOMY     bladder tact     BREAST BIOPSY     BREAST LUMPECTOMY Right 05/21/2021   BREAST LUMPECTOMY WITH RADIOACTIVE SEED LOCALIZATION Left 05/21/2021   Procedure: LEFT BREAST LUMPECTOMY WITH RADIOACTIVE SEED LOCALIZATION;  Surgeon: Ebbie Cough, MD;  Location: [facility] OR;  Service: General;  Laterality: Left;   CARDIAC CATHETERIZATION  02/28/2018   CATARACT EXTRACTION Bilateral 2017   Dr. Octavia   CORONARY STENT INTERVENTION N/A 02/28/2018   Procedure: CORONARY STENT INTERVENTION;  Surgeon: Dann Candyce RAMAN, MD;  Location: [facility] INVASIVE CV LAB;  Service: Cardiovascular;  Laterality: N/A;   CORONARY STENT PLACEMENT     CORONARY STENT PLACEMENT  04/02/019   EYE SURGERY Bilateral    cataract   HEMORRHOID SURGERY     LEFT HEART CATH AND CORONARY ANGIOGRAPHY N/A 02/28/2018   Procedure: LEFT HEART CATH AND CORONARY ANGIOGRAPHY;  Surgeon: Dann Candyce RAMAN, MD;  Location: [facility] INVASIVE CV LAB;  Service: Cardiovascular;  Laterality: N/A;   MOHS SURGERY  07/2013   BCC.  [facility]   RECTAL PROLAPSE REPAIR  1976   TONSILLECTOMY AND ADENOIDECTOMY     ULTRASOUND GUIDANCE FOR VASCULAR ACCESS  02/28/2018   Procedure: Ultrasound Guidance For Vascular  Access;  Surgeon: Dann Candyce RAMAN, MD;  Location: [facility] INVASIVE CV LAB;  Service: Cardiovascular;;   Patient Active Problem List   Diagnosis Date Noted   Sensorineural hearing loss, unilateral, left ear, with unrestricted hearing on the contralateral side 05/12/2024   Dizziness 04/16/2024   Impacted cerumen of right ear 11/12/2023   Chronic rhinitis 11/12/2023   Hypertrophy of nasal turbinates 11/12/2023   Routine general medical examination at a health care facility 01/04/2023   Genetic testing 04/15/2021   Family history of malignant melanoma 04/01/2021   Family history of breast cancer 04/01/2021   Family history of colon cancer 04/01/2021   Malignant neoplasm of upper-outer quadrant of left breast in female, estrogen  receptor positive (HCC) 03/30/2021   Hypokalemia 03/10/2021   Pneumonia due to COVID-19 virus 12/20/2019   Hyponatremia 12/20/2019   Hypochloremia 12/20/2019   Vitamin D  deficiency, unspecified 11/07/2019   Abnormal stress test 02/28/2018   CAD (coronary artery disease)    PVC's (premature ventricular contractions) 02/10/2018   Palpitations 02/10/2018   Hyperlipidemia LDL goal <70 02/09/2009   Hypertension with heart disease 02/09/2009   Coronary artery disease involving native coronary artery of native heart without angina pectoris 02/09/2009    PCP: [place], Betty G, MD  REFERRING PROVIDER: Karis Clunes, MD  REFERRING DIAG: R42 (ICD-10-CM) - Dizziness  THERAPY DIAG:  Dizziness and giddiness  ONSET DATE: summer 2025   Rationale for Evaluation and Treatment: Rehabilitation  SUBJECTIVE:   SUBJECTIVE STATEMENT: Patient reports that more than 5 years ago she had an event when she was turning over and had an event of vertigo. This resolved. Reports that she has had 4 recent additional episodes. Most recent event was 2 weeks ago, and another 4 weeks ago. Episodes include nausea/vomiting, not feeling right, dizziness. Reports that she takes Meclizine  and symptoms last about 12 hours. Reports that events usually occur in the AM. Symptoms are spontaneous. Reports sensation that eyes won't focus when these episodes occur. Reports that she had 1 migraine 50 years ago. Reports a hx of sudden hearing loss B but cannot recall when this was.   Pt accompanied by: self  PERTINENT HISTORY: Per Dr. Karis- L Meniere's Disease Angina, anxiety and depression, breast CA s/p B lumpectomy, CAD, HLD, HTN, MI  PAIN:  Are you having pain? No  PRECAUTIONS: Fall  RED FLAGS: None   WEIGHT BEARING RESTRICTIONS: No  FALLS: Has patient fallen in last 6 months? No  LIVING ENVIRONMENT: Lives with: lives alone Lives in: House/apartment Stairs: 1 step to enter with railing; 2 story home with bedroom  upstairs  Has following equipment at home: Vannie - 2 wheeled and Grab bars  PLOF: Independent and Vocation/Vocational requirements: retired  PATIENT GOALS: work on dizziness   OBJECTIVE:  Note: Objective measures were completed at Evaluation unless otherwise noted.  DIAGNOSTIC FINDINGS: 04/17/24 brain/IAC MRI: No cerebellopontine angle or internal auditory canal mass. 2. No etiology of hearing loss is identified. 3. Mild cerebral atrophy. 4. 2.9 cm left maxillary sinus mucous retention cyst.  COGNITION: Overall cognitive status: Within functional limits for tasks assessed   SENSATION: Pt denies N/T in UEs/LEs   POSTURE:  forward head  GAIT: Gait pattern: slightly decreased gait speed and WFL Assistive device utilized: None Level of assistance: Modified independence   PATIENT SURVEYS:  DHI: THE DIZZINESS HANDICAP INVENTORY [facility]): 18/100   DHI Scoring Instructions  The patient is asked to answer each question as it pertains to dizziness or unsteadiness problems, specifically  considering their condition during the last month. Questions are designed to incorporate functional (F), physical  (P), and emotional (E) impacts on disability.   Scores greater than 10 points should be referred to balance specialists for further evaluation.   16-34 Points (mild handicap)  36-52 Points (moderate handicap)  54+ Points (severe handicap)  Minimally Detectable Change: 17 points ([address] [place], 1990)  [place], G. SHAUNNA. and [place], C. W. (1990). The development of the Dizziness Handicap Inventory. Archives of Otolaryngology - Head and Neck Surgery 116(4): [id number].   VESTIBULAR ASSESSMENT   GENERAL OBSERVATION: pt does not wear glasses/contacts    OCULOMOTOR EXAM: Ocular Alignment: Cover/Uncover Test: WNL Cover/Cross Cover Test: WNL   Ocular ROM: No Limitations   Spontaneous Nystagmus: absent   Gaze-Induced Nystagmus: absent   Smooth Pursuits: intact   Saccades:  intact   Convergence/Divergence: 3 cm    VESTIBULAR - OCULAR REFLEX:    Slow VOR: Normal   VOR Cancellation: Normal   Head-Impulse Test: HIT Right: covert positive HIT Left: covert positive  *pt reports less queasiness after testing   AUDITORY SCREEN:  Rinne Test intact B    POSITIONAL TESTING:  Right Roll Test: negative Left Roll Test: negative  Right Loaded Dix-Hallpike: negative Left Loaded Dix-Hallpike: negative                                                                                                                               TREATMENT DATE: 06/29/24   PATIENT EDUCATION: Education details: discussed Dr. Rojean diagnosis of Meniere's disease and edu on benefits of vestibular rehab for Meniere's in between episodes, prognosis, POC, plans for next session  Person educated: Patient Education method: Explanation Education comprehension: verbalized understanding  HOME EXERCISE PROGRAM:   GOALS: Goals reviewed with patient? Yes  SHORT TERM GOALS: Target date: 07/20/2024  Patient to be independent with initial HEP. Baseline: HEP initiated Goal status: INITIAL    LONG TERM GOALS: Target date: 08/10/2024  Patient to be independent with advanced HEP. Baseline: Not yet initiated  Goal status: INITIAL  Patient to demonstrate mild-moderate sway with M-CTSIB condition with eyes closed/foam surface in order to improve safety in environments with uneven surfaces and dim lighting. Baseline: NT Goal status: INITIAL  Patient to score at least 25/30 on FGA in order to decrease risk of falls. Baseline: NT Goal status: INITIAL  Patient to score at least 18 points less on DHI in order to meet MCID and improve functional outcomes.  Baseline: 18 Goal status: INITIAL   ASSESSMENT:  CLINICAL IMPRESSION:   Patient is an 83 y/o F presenting to OPPT with c/o spontaneous episodes of dizziness with asymptomatic periods in between. Also reports B hearing loss. Per Dr.  Karis, patient has L Meniere's diease and differential diagnosis includes transient BPPV, vestibular migraine, peripheral vestibular dysfunction, or other central/systemic causes. Patient today with slightly decreased gait speed, forward head posture, and covert positive B HIT. Plan for thorough  balance testing next session as this was limited by time today. Would benefit from skilled PT services 1x/week for 6 weeks to address aforementioned impairments in order to optimize level of function.    OBJECTIVE IMPAIRMENTS: Abnormal gait, decreased balance, and dizziness.   ACTIVITY LIMITATIONS: all activities are limited during episode of dizziness  PARTICIPATION LIMITATIONS: all activity participation is limited during episode of dizziness  PERSONAL FACTORS: Age, Past/current experiences, and 3+ comorbidities: Angina, anxiety and depression, breast CA s/p B lumpectomy, CAD, HLD, HTN, MI are also affecting patient's functional outcome.   REHAB POTENTIAL: Good  CLINICAL DECISION MAKING: Unstable/unpredictable  EVALUATION COMPLEXITY: Moderate   PLAN:  PT FREQUENCY: 1x/week  PT DURATION: 6 weeks  PLANNED INTERVENTIONS: 97164- PT Re-evaluation, 97110-Therapeutic exercises, 97530- Therapeutic activity, 97112- Neuromuscular re-education, 97535- Self Care, [id number]- Manual therapy, [phone]- Gait training, [phone]- Canalith repositioning, Patient/Family education, Balance training, Stair training, Taping, Vestibular training, DME instructions, Cryotherapy, and Moist heat  PLAN FOR NEXT SESSION: FGA, MCTSIB; initiate HEP for balance and habituation if needed   Louana Terrilyn Christians, PT, DPT 06/29/24 12:06 PM  [facility] [address] [place], [place] [address] Phone # [phone] Fax # [phone]

## 2024-06-29 ENCOUNTER — Ambulatory Visit: Attending: Otolaryngology | Admitting: Physical Therapy

## 2024-06-29 ENCOUNTER — Other Ambulatory Visit: Payer: Self-pay

## 2024-06-29 ENCOUNTER — Encounter: Payer: Self-pay | Admitting: Physical Therapy

## 2024-06-29 DIAGNOSIS — R42 Dizziness and giddiness: Secondary | ICD-10-CM | POA: Diagnosis not present

## 2024-07-04 NOTE — Therapy (Signed)
 OUTPATIENT PHYSICAL THERAPY VESTIBULAR TREATMENT     Patient Name: Erika Brown MRN: [id number] DOB:1941/02/25, 83 y.o., female Today's Date: 07/05/2024  END OF SESSION:  PT End of Session - 07/05/24 1354     Visit Number 2    Number of Visits 13    Date for PT Re-Evaluation 08/10/24    Authorization Type [employer]    PT Start Time 1315    PT Stop Time 1357    PT Time Calculation (min) 42 min    Equipment Utilized During Treatment Gait belt    Activity Tolerance Patient tolerated treatment well    Behavior During Therapy WFL for tasks assessed/performed           Past Medical History:  Diagnosis Date   Anginal pain (HCC)    Anxiety and depression    Breast cancer (HCC)    Breast cancer (HCC)    CAD (coronary artery disease)    S/P NSTEMI 4/06 EF 60%; Treated with Cypher DES to LAD and RCA with kissing balloon PCI diagonal; Treadmill myoview  10/09 for CP: Walked 7:06. mild ST-T-wave changes in recovery. EF 71%. Very mild reversible defect in base of the inferior wall --> medical rx.   Cancer (HCC)    HISTORY OF SKIN CANCER   Cataract    Complication of anesthesia     DIFFICULT TO WAKE UP    Depression    Family history of breast cancer 04/01/2021   Family history of colon cancer 04/01/2021   Family history of malignant melanoma 04/01/2021   Glucose intolerance (impaired glucose tolerance)    History of echocardiogram    Echo 11/17: Vigorous LVF, EF 65-70, normal wall motion, borderline diastolic function, systolic bowing of mitral valve without prolapse, mild MR, trivial TR, PASP 20   History of medication noncompliance    Hyperlipidemia    Intolerate to multiple statins due to severe myalgias   Hypertension    Possible white-coat component   MI (myocardial infarction) (HCC)    acute-s/p stent   Microhematuria    Pneumonia due to COVID-19 virus    Psoriasis    Rapid heart beat    Vitamin D  deficiency    Past Surgical History:  Procedure Laterality  Date   ABDOMINAL HYSTERECTOMY     and anterior repair   APPENDECTOMY     bladder tact     BREAST BIOPSY     BREAST LUMPECTOMY Right 05/21/2021   BREAST LUMPECTOMY WITH RADIOACTIVE SEED LOCALIZATION Left 05/21/2021   Procedure: LEFT BREAST LUMPECTOMY WITH RADIOACTIVE SEED LOCALIZATION;  Surgeon: Ebbie Cough, MD;  Location: [facility] OR;  Service: General;  Laterality: Left;   CARDIAC CATHETERIZATION  02/28/2018   CATARACT EXTRACTION Bilateral 2017   Dr. Octavia   CORONARY STENT INTERVENTION N/A 02/28/2018   Procedure: CORONARY STENT INTERVENTION;  Surgeon: Dann Candyce RAMAN, MD;  Location: [facility] INVASIVE CV LAB;  Service: Cardiovascular;  Laterality: N/A;   CORONARY STENT PLACEMENT     CORONARY STENT PLACEMENT  04/02/019   EYE SURGERY Bilateral    cataract   HEMORRHOID SURGERY     LEFT HEART CATH AND CORONARY ANGIOGRAPHY N/A 02/28/2018   Procedure: LEFT HEART CATH AND CORONARY ANGIOGRAPHY;  Surgeon: Dann Candyce RAMAN, MD;  Location: [facility] INVASIVE CV LAB;  Service: Cardiovascular;  Laterality: N/A;   MOHS SURGERY  07/2013   BCC.  [facility]   RECTAL PROLAPSE REPAIR  1976   TONSILLECTOMY AND ADENOIDECTOMY     ULTRASOUND GUIDANCE FOR VASCULAR  ACCESS  02/28/2018   Procedure: Ultrasound Guidance For Vascular Access;  Surgeon: Dann Candyce RAMAN, MD;  Location: [facility] INVASIVE CV LAB;  Service: Cardiovascular;;   Patient Active Problem List   Diagnosis Date Noted   Sensorineural hearing loss, unilateral, left ear, with unrestricted hearing on the contralateral side 05/12/2024   Dizziness 04/16/2024   Impacted cerumen of right ear 11/12/2023   Chronic rhinitis 11/12/2023   Hypertrophy of nasal turbinates 11/12/2023   Routine general medical examination at a health care facility 01/04/2023   Genetic testing 04/15/2021   Family history of malignant melanoma 04/01/2021   Family history of breast cancer 04/01/2021   Family history of colon cancer 04/01/2021   Malignant neoplasm of upper-outer  quadrant of left breast in female, estrogen receptor positive (HCC) 03/30/2021   Hypokalemia 03/10/2021   Pneumonia due to COVID-19 virus 12/20/2019   Hyponatremia 12/20/2019   Hypochloremia 12/20/2019   Vitamin D  deficiency, unspecified 11/07/2019   Abnormal stress test 02/28/2018   CAD (coronary artery disease)    PVC's (premature ventricular contractions) 02/10/2018   Palpitations 02/10/2018   Hyperlipidemia LDL goal <70 02/09/2009   Hypertension with heart disease 02/09/2009   Coronary artery disease involving native coronary artery of native heart without angina pectoris 02/09/2009    PCP: [place], Betty G, MD  REFERRING PROVIDER: Karis Clunes, MD  REFERRING DIAG: R42 (ICD-10-CM) - Dizziness  THERAPY DIAG:  Dizziness and giddiness  ONSET DATE: summer 2025   Rationale for Evaluation and Treatment: Rehabilitation  SUBJECTIVE:   SUBJECTIVE STATEMENT: Patient reports a correction that less than a month ago she recalls having [place]. Denies injury. Patient denies changes in dizziness recently.   Pt accompanied by: self  PERTINENT HISTORY: Per Dr. Karis- L Meniere's Disease Angina, anxiety and depression, breast CA s/p B lumpectomy, CAD, HLD, HTN, MI  PAIN:  Are you having pain? No  PRECAUTIONS: Fall  RED FLAGS: None   WEIGHT BEARING RESTRICTIONS: No  FALLS: Has patient fallen in last 6 months? No  LIVING ENVIRONMENT: Lives with: lives alone Lives in: House/apartment Stairs: 1 step to enter with railing; 2 story home with bedroom upstairs  Has following equipment at home: Vannie - 2 wheeled and Grab bars  PLOF: Independent and Vocation/Vocational requirements: retired  PATIENT GOALS: work on dizziness   OBJECTIVE:     TODAY'S TREATMENT: 07/05/24 Activity Comments  FGA 25/30  Romberg on foam in corner EO/EC 30 each Mild sway; performed in corner and with chair in front for safety  Romberg on foam in corner + head turns/nods  30 each   Mild-moderate sway; performed in corner and with chair in front for safety               M-CTSIB  Condition 1: Firm Surface, EO 30 Sec, Normal Sway  Condition 2: Firm Surface, EC 30 Sec, Normal and Mild Sway  Condition 3: Foam Surface, EO 30 Sec, Mild Sway  Condition 4: Foam Surface, EC 6 Sec, Moderate Sway     [facility] PT Assessment - 07/05/24 0001       Functional Gait  Assessment   Gait assessed  Yes    Gait Level Surface Walks 20 ft in less than 5.5 sec, no assistive devices, good speed, no evidence for imbalance, normal gait pattern, deviates no more than 6 in outside of the 12 in walkway width.    Change in Gait Speed Able to smoothly change walking speed without loss of balance  or gait deviation. Deviate no more than 6 in outside of the 12 in walkway width.    Gait with Horizontal Head Turns Performs head turns smoothly with slight change in gait velocity (eg, minor disruption to smooth gait path), deviates 6-10 in outside 12 in walkway width, or uses an assistive device.    Gait with Vertical Head Turns Performs head turns with no change in gait. Deviates no more than 6 in outside 12 in walkway width.    Gait and Pivot Turn Pivot turns safely within 3 sec and stops quickly with no loss of balance.    Step Over Obstacle Is able to step over 2 stacked shoe boxes taped together (9 in total height) without changing gait speed. No evidence of imbalance.    Gait with Narrow Base of Support Ambulates 4-7 steps.    Gait with Eyes Closed Walks 20 ft, uses assistive device, slower speed, mild gait deviations, deviates 6-10 in outside 12 in walkway width. Ambulates 20 ft in less than 9 sec but greater than 7 sec.    Ambulating Backwards Walks 20 ft, uses assistive device, slower speed, mild gait deviations, deviates 6-10 in outside 12 in walkway width.    Steps Alternating feet, no rail.    Total Score 25           HOME EXERCISE PROGRAM Last updated: 07/05/24 Access Code: [email or web address] URL:  [email or web address] Date: 07/05/2024 Prepared by: [facility] - Outpatient  Rehab - [facility]  Program Notes perform activities in a corner or at counter for safety  Exercises - Backward Walking with Counter Support  - 1 x daily - 5 x weekly - 2 sets - 5 reps - Walking with Eyes Closed and Counter Support  - 1 x daily - 5 x weekly - 2 sets - 5 reps - Tandem Walking with Counter Support  - 1 x daily - 5 x weekly - 2 sets - 5 reps - Romberg Stance on Foam Pad  - 1 x daily - 5 x weekly - 2-3 sets - 30 sec hold - Standing Balance with Eyes Closed on Foam  - 1 x daily - 5 x weekly - 2-3 sets - 30 sec hold   PATIENT EDUCATION: Education details: edu on exam findings, edu on systems used for balance, HEP with edu for safety Person educated: Patient Education method: Explanation, Demonstration, Tactile cues, Verbal cues, and Handouts Education comprehension: verbalized understanding and returned demonstration    Note: Objective measures were completed at Evaluation unless otherwise noted.  DIAGNOSTIC FINDINGS: 04/17/24 brain/IAC MRI: No cerebellopontine angle or internal auditory canal mass. 2. No etiology of hearing loss is identified. 3. Mild cerebral atrophy. 4. 2.9 cm left maxillary sinus mucous retention cyst.  COGNITION: Overall cognitive status: Within functional limits for tasks assessed   SENSATION: Pt denies N/T in UEs/LEs   POSTURE:  forward head  GAIT: Gait pattern: slightly decreased gait speed and WFL Assistive device utilized: None Level of assistance: Modified independence   PATIENT SURVEYS:  DHI: THE DIZZINESS HANDICAP INVENTORY [facility]): 18/100   DHI Scoring Instructions  The patient is asked to answer each question as it pertains to dizziness or unsteadiness problems, specifically  considering their condition during the last month. Questions are designed to incorporate functional (F), physical  (P), and emotional (E) impacts on  disability.   Scores greater than 10 points should be referred to balance specialists for further evaluation.   16-34 Points (mild handicap)  36-52 Points (moderate  handicap)  54+ Points (severe handicap)  Minimally Detectable Change: 17 points ([address] [place], 1990)  [place], G. SHAUNNA. and [place], C. W. (1990). The development of the Dizziness Handicap Inventory. Archives of Otolaryngology - Head and Neck Surgery 116(4): [id number].   VESTIBULAR ASSESSMENT   GENERAL OBSERVATION: pt does not wear glasses/contacts    OCULOMOTOR EXAM: Ocular Alignment: Cover/Uncover Test: WNL Cover/Cross Cover Test: WNL   Ocular ROM: No Limitations   Spontaneous Nystagmus: absent   Gaze-Induced Nystagmus: absent   Smooth Pursuits: intact   Saccades: intact   Convergence/Divergence: 3 cm    VESTIBULAR - OCULAR REFLEX:    Slow VOR: Normal   VOR Cancellation: Normal   Head-Impulse Test: HIT Right: covert positive HIT Left: covert positive  *pt reports less queasiness after testing   AUDITORY SCREEN:  Rinne Test intact B    POSITIONAL TESTING:  Right Roll Test: negative Left Roll Test: negative  Right Loaded Dix-Hallpike: negative Left Loaded Dix-Hallpike: negative                                                                                                                               TREATMENT DATE: 06/29/24   PATIENT EDUCATION: Education details: discussed Dr. Rojean diagnosis of Meniere's disease and edu on benefits of vestibular rehab for Meniere's in between episodes, prognosis, POC, plans for next session  Person educated: Patient Education method: Explanation Education comprehension: verbalized understanding  HOME EXERCISE PROGRAM:   GOALS: Goals reviewed with patient? Yes  SHORT TERM GOALS: Target date: 07/20/2024  Patient to be independent with initial HEP. Baseline: HEP initiated Goal status: IN PROGRESS    LONG TERM GOALS: Target date: 08/10/2024  Patient to  be independent with advanced HEP. Baseline: Not yet initiated  Goal status: IN PROGRESS  Patient to demonstrate mild-moderate sway with M-CTSIB condition with eyes closed/foam surface in order to improve safety in environments with uneven surfaces and dim lighting. Baseline: NT Goal status: IN PROGRESS  Patient to score at least 25/30 on FGA in order to decrease risk of falls. Baseline: NT Goal status: IN PROGRESS  Patient to score at least 18 points less on DHI in order to meet MCID and improve functional outcomes.  Baseline: 18 Goal status: IN PROGRESS   ASSESSMENT:  CLINICAL IMPRESSION: Patient arrived to session without new complaints. Patient scored 25/30 on FGA, indicating a decreased risk of falls. Imbalance evident with narrow BOS, EC, and backwards walking as well as head turns to the L. Multisensory balance was diminished, indicating deceased vestibular use for balance. Patient was thoroughly educated on HEP to address these deficits and reported understanding. No complaints upon leaving.   OBJECTIVE IMPAIRMENTS: Abnormal gait, decreased balance, and dizziness.   ACTIVITY LIMITATIONS: all activities are limited during episode of dizziness  PARTICIPATION LIMITATIONS: all activity participation is limited during episode of dizziness  PERSONAL FACTORS: Age, Past/current experiences, and 3+ comorbidities: Angina, anxiety and depression, breast CA s/p B  lumpectomy, CAD, HLD, HTN, MI are also affecting patient's functional outcome.   REHAB POTENTIAL: Good  CLINICAL DECISION MAKING: Unstable/unpredictable  EVALUATION COMPLEXITY: Moderate   PLAN:  PT FREQUENCY: 1x/week  PT DURATION: 6 weeks  PLANNED INTERVENTIONS: 97164- PT Re-evaluation, 97110-Therapeutic exercises, 97530- Therapeutic activity, 97112- Neuromuscular re-education, 97535- Self Care, 02859- Manual therapy, [phone]- Gait training, [phone]- Canalith repositioning, Patient/Family education, Balance training, Stair  training, Taping, Vestibular training, DME instructions, Cryotherapy, and Moist heat  PLAN FOR NEXT SESSION:  review HEP for balance and habituation if needed; MSQ? work on improving balance confidence with stairs, especially when carrying items; discuss exercise classes to maintain fitness   Louana Terrilyn Christians, PT, DPT 07/05/24 1:57 PM  [facility] at [facility] [address] [place], [place] [address] Phone # [phone] Fax # [phone]

## 2024-07-05 ENCOUNTER — Ambulatory Visit: Admitting: Physical Therapy

## 2024-07-05 ENCOUNTER — Encounter: Payer: Self-pay | Admitting: Physical Therapy

## 2024-07-05 DIAGNOSIS — R42 Dizziness and giddiness: Secondary | ICD-10-CM

## 2024-07-10 NOTE — Therapy (Signed)
 OUTPATIENT PHYSICAL THERAPY VESTIBULAR TREATMENT     Patient Name: Erika Brown MRN: [id number] DOB:07-18-1941, 83 y.o., female Today's Date: 07/11/2024  END OF SESSION:  PT End of Session - 07/11/24 0928     Visit Number 3    Number of Visits 13    Date for PT Re-Evaluation 08/10/24    Authorization Type [employer]    PT Start Time 0849    PT Stop Time 0930    PT Time Calculation (min) 41 min    Equipment Utilized During Treatment Gait belt    Activity Tolerance Patient tolerated treatment well    Behavior During Therapy WFL for tasks assessed/performed            Past Medical History:  Diagnosis Date   Anginal pain (HCC)    Anxiety and depression    Breast cancer (HCC)    Breast cancer (HCC)    CAD (coronary artery disease)    S/P NSTEMI 4/06 EF 60%; Treated with Cypher DES to LAD and RCA with kissing balloon PCI diagonal; Treadmill myoview  10/09 for CP: Walked 7:06. mild ST-T-wave changes in recovery. EF 71%. Very mild reversible defect in base of the inferior wall --> medical rx.   Cancer (HCC)    HISTORY OF SKIN CANCER   Cataract    Complication of anesthesia     DIFFICULT TO WAKE UP    Depression    Family history of breast cancer 04/01/2021   Family history of colon cancer 04/01/2021   Family history of malignant melanoma 04/01/2021   Glucose intolerance (impaired glucose tolerance)    History of echocardiogram    Echo 11/17: Vigorous LVF, EF 65-70, normal wall motion, borderline diastolic function, systolic bowing of mitral valve without prolapse, mild MR, trivial TR, PASP 20   History of medication noncompliance    Hyperlipidemia    Intolerate to multiple statins due to severe myalgias   Hypertension    Possible white-coat component   MI (myocardial infarction) (HCC)    acute-s/p stent   Microhematuria    Pneumonia due to COVID-19 virus    Psoriasis    Rapid heart beat    Vitamin D  deficiency    Past Surgical History:  Procedure  Laterality Date   ABDOMINAL HYSTERECTOMY     and anterior repair   APPENDECTOMY     bladder tact     BREAST BIOPSY     BREAST LUMPECTOMY Right 05/21/2021   BREAST LUMPECTOMY WITH RADIOACTIVE SEED LOCALIZATION Left 05/21/2021   Procedure: LEFT BREAST LUMPECTOMY WITH RADIOACTIVE SEED LOCALIZATION;  Surgeon: Ebbie Cough, MD;  Location: [facility] OR;  Service: General;  Laterality: Left;   CARDIAC CATHETERIZATION  02/28/2018   CATARACT EXTRACTION Bilateral 2017   Dr. Octavia   CORONARY STENT INTERVENTION N/A 02/28/2018   Procedure: CORONARY STENT INTERVENTION;  Surgeon: Dann Candyce RAMAN, MD;  Location: [facility] INVASIVE CV LAB;  Service: Cardiovascular;  Laterality: N/A;   CORONARY STENT PLACEMENT     CORONARY STENT PLACEMENT  04/02/019   EYE SURGERY Bilateral    cataract   HEMORRHOID SURGERY     LEFT HEART CATH AND CORONARY ANGIOGRAPHY N/A 02/28/2018   Procedure: LEFT HEART CATH AND CORONARY ANGIOGRAPHY;  Surgeon: Dann Candyce RAMAN, MD;  Location: [facility] INVASIVE CV LAB;  Service: Cardiovascular;  Laterality: N/A;   MOHS SURGERY  07/2013   BCC.  [facility]   RECTAL PROLAPSE REPAIR  1976   TONSILLECTOMY AND ADENOIDECTOMY     ULTRASOUND GUIDANCE FOR  VASCULAR ACCESS  02/28/2018   Procedure: Ultrasound Guidance For Vascular Access;  Surgeon: Dann Candyce RAMAN, MD;  Location: [facility] INVASIVE CV LAB;  Service: Cardiovascular;;   Patient Active Problem List   Diagnosis Date Noted   Sensorineural hearing loss, unilateral, left ear, with unrestricted hearing on the contralateral side 05/12/2024   Dizziness 04/16/2024   Impacted cerumen of right ear 11/12/2023   Chronic rhinitis 11/12/2023   Hypertrophy of nasal turbinates 11/12/2023   Routine general medical examination at a health care facility 01/04/2023   Genetic testing 04/15/2021   Family history of malignant melanoma 04/01/2021   Family history of breast cancer 04/01/2021   Family history of colon cancer 04/01/2021   Malignant neoplasm of  upper-outer quadrant of left breast in female, estrogen receptor positive (HCC) 03/30/2021   Hypokalemia 03/10/2021   Pneumonia due to COVID-19 virus 12/20/2019   Hyponatremia 12/20/2019   Hypochloremia 12/20/2019   Vitamin D  deficiency, unspecified 11/07/2019   Abnormal stress test 02/28/2018   CAD (coronary artery disease)    PVC's (premature ventricular contractions) 02/10/2018   Palpitations 02/10/2018   Hyperlipidemia LDL goal <70 02/09/2009   Hypertension with heart disease 02/09/2009   Coronary artery disease involving native coronary artery of native heart without angina pectoris 02/09/2009    PCP: [place], Betty G, MD  REFERRING PROVIDER: Karis Clunes, MD  REFERRING DIAG: R42 (ICD-10-CM) - Dizziness  THERAPY DIAG:  Dizziness and giddiness  ONSET DATE: summer 2025   Rationale for Evaluation and Treatment: Rehabilitation  SUBJECTIVE:   SUBJECTIVE STATEMENT: Did not sleep well last night but otherwise doing well. Reports that she has been working on her HEP. Patient reports that she is feeling stronger since starting therapy.   Pt accompanied by: self  PERTINENT HISTORY: Per Dr. Karis- L Meniere's Disease Angina, anxiety and depression, breast CA s/p B lumpectomy, CAD, HLD, HTN, MI  PAIN:  Are you having pain? No  PRECAUTIONS: Fall  RED FLAGS: None   WEIGHT BEARING RESTRICTIONS: No  FALLS: Has patient fallen in last 6 months? No  LIVING ENVIRONMENT: Lives with: lives alone Lives in: House/apartment Stairs: 1 step to enter with railing; 2 story home with bedroom upstairs  Has following equipment at home: Vannie - 2 wheeled and Grab bars  PLOF: Independent and Vocation/Vocational requirements: retired  PATIENT GOALS: work on dizziness   OBJECTIVE:    TODAY'S TREATMENT: 07/11/24 Activity Comments  Review of HEP:  Backwards walking Walking EC Tandem walk Romberg EO on foam  Standing EC on foam  Along counter; required cueing for pacing and more  intentional steps in tandem. Good stability with corner exercises   Standing EC on foam + head turns/nods Requires cues to slow head movement down d/t moderate  sway. Balance improved with practice. Performed in corner with chair in front   Romberg EO on foam + head turns/nods  Requires cues to slow head movement down d/t moderate  sway. Performed in corner with chair in front   Alt toe tap to 2 cones  Cueing to slow down and use light fingertip support on counter and was able to perform with improved safety after practice     HOME EXERCISE PROGRAM Last updated: 07/11/24 Access Code: [email or web address] URL: [email or web address] Date: 07/11/2024 Prepared by: [facility]  Rehab - [facility]  Program Notes perform activities in a corner or at counter for safety  Exercises - Backward Walking with Counter Support  - 1 x daily - 5 x weekly -  2 sets - 5 reps - Walking with Eyes Closed and Counter Support  - 1 x daily - 5 x weekly - 2 sets - 5 reps - Tandem Walking with Counter Support  - 1 x daily - 5 x weekly - 2 sets - 5 reps - Romberg Stance on Foam Pad with Head Rotation  - 1 x daily - 5 x weekly - 2 sets - 30 sec hold - Corner Balance Feet Apart: Eyes Closed With Head Turns  - 1 x daily - 5 x weekly - 2 sets - 30 hold - Standing Toe Taps  - 1 x daily - 5 x weekly - 2 sets - 10 reps   PATIENT EDUCATION: Education details: answered pt's questions on HEP and POC, HEP update and edu for safety  Person educated: Patient Education method: Explanation, Demonstration, Tactile cues, Verbal cues, and Handouts Education comprehension: verbalized understanding and returned demonstration   Note: Objective measures were completed at Evaluation unless otherwise noted.  DIAGNOSTIC FINDINGS: 04/17/24 brain/IAC MRI: No cerebellopontine angle or internal auditory canal mass. 2. No etiology of hearing loss is identified. 3. Mild cerebral atrophy. 4. 2.9 cm left maxillary sinus  mucous retention cyst.  COGNITION: Overall cognitive status: Within functional limits for tasks assessed   SENSATION: Pt denies N/T in UEs/LEs   POSTURE:  forward head  GAIT: Gait pattern: slightly decreased gait speed and WFL Assistive device utilized: None Level of assistance: Modified independence   PATIENT SURVEYS:  DHI: THE DIZZINESS HANDICAP INVENTORY [facility]): 18/100   DHI Scoring Instructions  The patient is asked to answer each question as it pertains to dizziness or unsteadiness problems, specifically  considering their condition during the last month. Questions are designed to incorporate functional (F), physical  (P), and emotional (E) impacts on disability.   Scores greater than 10 points should be referred to balance specialists for further evaluation.   16-34 Points (mild handicap)  36-52 Points (moderate handicap)  54+ Points (severe handicap)  Minimally Detectable Change: 17 points ([address] [place], 1990)  [place], G. SHAUNNA. and [place]ou, C. W. (1990). The development of the Dizziness Handicap Inventory. Archives of Otolaryngology - Head and Neck Surgery 116(4): [id number].   VESTIBULAR ASSESSMENT   GENERAL OBSERVATION: pt does not wear glasses/contacts    OCULOMOTOR EXAM: Ocular Alignment: Cover/Uncover Test: WNL Cover/Cross Cover Test: WNL   Ocular ROM: No Limitations   Spontaneous Nystagmus: absent   Gaze-Induced Nystagmus: absent   Smooth Pursuits: intact   Saccades: intact   Convergence/Divergence: 3 cm    VESTIBULAR - OCULAR REFLEX:    Slow VOR: Normal   VOR Cancellation: Normal   Head-Impulse Test: HIT Right: covert positive HIT Left: covert positive  *pt reports less queasiness after testing   AUDITORY SCREEN:  Rinne Test intact B    POSITIONAL TESTING:  Right Roll Test: negative Left Roll Test: negative  Right Loaded Dix-Hallpike: negative Left Loaded Dix-Hallpike: negative  TREATMENT DATE: 06/29/24   PATIENT EDUCATION: Education details: discussed Dr. Rojean diagnosis of Meniere's disease and edu on benefits of vestibular rehab for Meniere's in between episodes, prognosis, POC, plans for next session  Person educated: Patient Education method: Explanation Education comprehension: verbalized understanding  HOME EXERCISE PROGRAM:   GOALS: Goals reviewed with patient? Yes  SHORT TERM GOALS: Target date: 07/20/2024  Patient to be independent with initial HEP. Baseline: HEP initiated Goal status: IN PROGRESS    LONG TERM GOALS: Target date: 08/10/2024  Patient to be independent with advanced HEP. Baseline: Not yet initiated  Goal status: IN PROGRESS  Patient to demonstrate mild-moderate sway with M-CTSIB condition with eyes closed/foam surface in order to improve safety in environments with uneven surfaces and dim lighting. Baseline: NT Goal status: IN PROGRESS  Patient to score at least 25/30 on FGA in order to decrease risk of falls. Baseline: NT Goal status: IN PROGRESS  Patient to score at least 18 points less on DHI in order to meet MCID and improve functional outcomes.  Baseline: 18 Goal status: IN PROGRESS   ASSESSMENT:  CLINICAL IMPRESSION: Patient arrived to session without complaints. Reviewed HEP to ensure max understanding and carryover, providing cueing as needed. Patient tolerated progression of multisensory and dynamic balance exercises. Required occasional cues to utilize reaching strategy PRN and reduce pace for max control. Patient reported understanding of all edu provided and without complaints upon leaving.   OBJECTIVE IMPAIRMENTS: Abnormal gait, decreased balance, and dizziness.   ACTIVITY LIMITATIONS: all activities are limited during episode of dizziness  PARTICIPATION LIMITATIONS: all activity participation is limited during episode of  dizziness  PERSONAL FACTORS: Age, Past/current experiences, and 3+ comorbidities: Angina, anxiety and depression, breast CA s/p B lumpectomy, CAD, HLD, HTN, MI are also affecting patient's functional outcome.   REHAB POTENTIAL: Good  CLINICAL DECISION MAKING: Unstable/unpredictable  EVALUATION COMPLEXITY: Moderate   PLAN:  PT FREQUENCY: 1x/week  PT DURATION: 6 weeks  PLANNED INTERVENTIONS: 97164- PT Re-evaluation, 97110-Therapeutic exercises, 97530- Therapeutic activity, 97112- Neuromuscular re-education, 97535- Self Care, [id number]- Manual therapy, [phone]- Gait training, [phone]- Canalith repositioning, Patient/Family education, Balance training, Stair training, Taping, Vestibular training, DME instructions, Cryotherapy, and Moist heat  PLAN FOR NEXT SESSION:  review HEP for balance and habituation if needed; MSQ? work on improving balance confidence with stairs, especially when carrying items; discuss exercise classes to maintain fitness   Louana Terrilyn Christians, PT, DPT 07/11/24 9:30 AM  [facility] at [facility] [address], [address] [place], [place] [address] Phone # [phone] Fax # [phone]

## 2024-07-11 ENCOUNTER — Ambulatory Visit: Admitting: Physical Therapy

## 2024-07-11 ENCOUNTER — Encounter: Payer: Self-pay | Admitting: Physical Therapy

## 2024-07-11 DIAGNOSIS — R42 Dizziness and giddiness: Secondary | ICD-10-CM | POA: Diagnosis not present

## 2024-07-19 ENCOUNTER — Ambulatory Visit: Admitting: Physical Therapy

## 2024-07-25 ENCOUNTER — Ambulatory Visit: Admitting: Physical Therapy

## 2024-07-25 NOTE — Therapy (Signed)
 OUTPATIENT PHYSICAL THERAPY VESTIBULAR TREATMENT     Patient Name: Erika Brown MRN: [id number] DOB:1941/04/20, 83 y.o., female Today's Date: 07/26/2024  END OF SESSION:  PT End of Session - 07/26/24 1139     Visit Number 4    Number of Visits 13    Date for PT Re-Evaluation 08/10/24    Authorization Type [employer]    PT Start Time 1103    PT Stop Time 1143    PT Time Calculation (min) 40 min    Equipment Utilized During Treatment Gait belt    Activity Tolerance Patient tolerated treatment well    Behavior During Therapy WFL for tasks assessed/performed             Past Medical History:  Diagnosis Date   Anginal pain (HCC)    Anxiety and depression    Breast cancer (HCC)    Breast cancer (HCC)    CAD (coronary artery disease)    S/P NSTEMI 4/06 EF 60%; Treated with Cypher DES to LAD and RCA with kissing balloon PCI diagonal; Treadmill myoview  10/09 for CP: Walked 7:06. mild ST-T-wave changes in recovery. EF 71%. Very mild reversible defect in base of the inferior wall --> medical rx.   Cancer (HCC)    HISTORY OF SKIN CANCER   Cataract    Complication of anesthesia     DIFFICULT TO WAKE UP    Depression    Family history of breast cancer 04/01/2021   Family history of colon cancer 04/01/2021   Family history of malignant melanoma 04/01/2021   Glucose intolerance (impaired glucose tolerance)    History of echocardiogram    Echo 11/17: Vigorous LVF, EF 65-70, normal wall motion, borderline diastolic function, systolic bowing of mitral valve without prolapse, mild MR, trivial TR, PASP 20   History of medication noncompliance    Hyperlipidemia    Intolerate to multiple statins due to severe myalgias   Hypertension    Possible white-coat component   MI (myocardial infarction) (HCC)    acute-s/p stent   Microhematuria    Pneumonia due to COVID-19 virus    Psoriasis    Rapid heart beat    Vitamin D  deficiency    Past Surgical History:  Procedure  Laterality Date   ABDOMINAL HYSTERECTOMY     and anterior repair   APPENDECTOMY     bladder tact     BREAST BIOPSY     BREAST LUMPECTOMY Right 05/21/2021   BREAST LUMPECTOMY WITH RADIOACTIVE SEED LOCALIZATION Left 05/21/2021   Procedure: LEFT BREAST LUMPECTOMY WITH RADIOACTIVE SEED LOCALIZATION;  Surgeon: Ebbie Cough, MD;  Location: [facility] OR;  Service: General;  Laterality: Left;   CARDIAC CATHETERIZATION  02/28/2018   CATARACT EXTRACTION Bilateral 2017   Dr. Octavia   CORONARY STENT INTERVENTION N/A 02/28/2018   Procedure: CORONARY STENT INTERVENTION;  Surgeon: Dann Candyce RAMAN, MD;  Location: [facility] INVASIVE CV LAB;  Service: Cardiovascular;  Laterality: N/A;   CORONARY STENT PLACEMENT     CORONARY STENT PLACEMENT  04/02/019   EYE SURGERY Bilateral    cataract   HEMORRHOID SURGERY     LEFT HEART CATH AND CORONARY ANGIOGRAPHY N/A 02/28/2018   Procedure: LEFT HEART CATH AND CORONARY ANGIOGRAPHY;  Surgeon: Dann Candyce RAMAN, MD;  Location: [facility] INVASIVE CV LAB;  Service: Cardiovascular;  Laterality: N/A;   MOHS SURGERY  07/2013   BCC.  [facility]   RECTAL PROLAPSE REPAIR  1976   TONSILLECTOMY AND ADENOIDECTOMY     ULTRASOUND GUIDANCE  FOR VASCULAR ACCESS  02/28/2018   Procedure: Ultrasound Guidance For Vascular Access;  Surgeon: Dann Candyce RAMAN, MD;  Location: [facility] INVASIVE CV LAB;  Service: Cardiovascular;;   Patient Active Problem List   Diagnosis Date Noted   Sensorineural hearing loss, unilateral, left ear, with unrestricted hearing on the contralateral side 05/12/2024   Dizziness 04/16/2024   Impacted cerumen of right ear 11/12/2023   Chronic rhinitis 11/12/2023   Hypertrophy of nasal turbinates 11/12/2023   Routine general medical examination at a health care facility 01/04/2023   Genetic testing 04/15/2021   Family history of malignant melanoma 04/01/2021   Family history of breast cancer 04/01/2021   Family history of colon cancer 04/01/2021   Malignant neoplasm of  upper-outer quadrant of left breast in female, estrogen receptor positive (HCC) 03/30/2021   Hypokalemia 03/10/2021   Pneumonia due to COVID-19 virus 12/20/2019   Hyponatremia 12/20/2019   Hypochloremia 12/20/2019   Vitamin D  deficiency, unspecified 11/07/2019   Abnormal stress test 02/28/2018   CAD (coronary artery disease)    PVC's (premature ventricular contractions) 02/10/2018   Palpitations 02/10/2018   Hyperlipidemia LDL goal <70 02/09/2009   Hypertension with heart disease 02/09/2009   Coronary artery disease involving native coronary artery of native heart without angina pectoris 02/09/2009    PCP: [place], Betty G, MD  REFERRING PROVIDER: Karis Clunes, MD  REFERRING DIAG: R42 (ICD-10-CM) - Dizziness  THERAPY DIAG:  Dizziness and giddiness  ONSET DATE: summer 2025   Rationale for Evaluation and Treatment: Rehabilitation  SUBJECTIVE:   SUBJECTIVE STATEMENT: I've been a bad student. Reports that she has only been working on her HEP when she feels like it. Reports that she might have black and blue marks on her back/bottom d/t walking into the counter with backwards walking.   Pt accompanied by: self  PERTINENT HISTORY: Per Dr. Karis- L Meniere's Disease Angina, anxiety and depression, breast CA s/p B lumpectomy, CAD, HLD, HTN, MI  PAIN:  Are you having pain? No  PRECAUTIONS: Fall  RED FLAGS: None   WEIGHT BEARING RESTRICTIONS: No  FALLS: Has patient fallen in last 6 months? No  LIVING ENVIRONMENT: Lives with: lives alone Lives in: House/apartment Stairs: 1 step to enter with railing; 2 story home with bedroom upstairs  Has following equipment at home: Vannie - 2 wheeled and Grab bars  PLOF: Independent and Vocation/Vocational requirements: retired  PATIENT GOALS: work on dizziness   OBJECTIVE:     TODAY'S TREATMENT: 07/26/24 Activity Comments  review of HEP update: romberg on foam + head turns/nods alt toe tap to 2 cones Cueing and practice slowing  down. Cues to contract core and glutes to improve stability   fwd/back stepping + head turns/nods At TM rail and weaned UE support; mild-mod instability. Cueing for symmetrical step length   wall bumps EC on foam With EO and EC; pt required increased instruction to maintain intended sequence      PATIENT EDUCATION: Education details: review of HEP and provided cueing for form; edu on ankle/hip/step/reaching strategies to correct LOB, edu on habit pairing to improve HEP consistency  Person educated: Patient Education method: Explanation, Demonstration, Tactile cues, and Verbal cues Education comprehension: verbalized understanding and returned demonstration    HOME EXERCISE PROGRAM Last updated: 07/11/24 Access Code: [email or web address] URL: [email or web address] Date: 07/11/2024 Prepared by: [facility] - Outpatient  Rehab - [facility]  Program Notes perform activities in a corner or at counter for safety  Exercises - Backward Walking with Counter Support  -  1 x daily - 5 x weekly - 2 sets - 5 reps - Walking with Eyes Closed and Counter Support  - 1 x daily - 5 x weekly - 2 sets - 5 reps - Tandem Walking with Counter Support  - 1 x daily - 5 x weekly - 2 sets - 5 reps - Romberg Stance on Foam Pad with Head Rotation  - 1 x daily - 5 x weekly - 2 sets - 30 sec hold - Corner Balance Feet Apart: Eyes Closed With Head Turns  - 1 x daily - 5 x weekly - 2 sets - 30 hold - Standing Toe Taps  - 1 x daily - 5 x weekly - 2 sets - 10 reps    Note: Objective measures were completed at Evaluation unless otherwise noted.  DIAGNOSTIC FINDINGS: 04/17/24 brain/IAC MRI: No cerebellopontine angle or internal auditory canal mass. 2. No etiology of hearing loss is identified. 3. Mild cerebral atrophy. 4. 2.9 cm left maxillary sinus mucous retention cyst.  COGNITION: Overall cognitive status: Within functional limits for tasks assessed   SENSATION: Pt denies N/T in UEs/LEs   POSTURE:   forward head  GAIT: Gait pattern: slightly decreased gait speed and WFL Assistive device utilized: None Level of assistance: Modified independence   PATIENT SURVEYS:  DHI: THE DIZZINESS HANDICAP INVENTORY [facility]): 18/100   DHI Scoring Instructions  The patient is asked to answer each question as it pertains to dizziness or unsteadiness problems, specifically  considering their condition during the last month. Questions are designed to incorporate functional (F), physical  (P), and emotional (E) impacts on disability.   Scores greater than 10 points should be referred to balance specialists for further evaluation.   16-34 Points (mild handicap)  36-52 Points (moderate handicap)  54+ Points (severe handicap)  Minimally Detectable Change: 17 points ([address] [place], 1990)  Lazy Y U, G. SHAUNNA. and Garland, C. W. (1990). The development of the Dizziness Handicap Inventory. Archives of Otolaryngology - Head and Neck Surgery 116(4): [id number].   VESTIBULAR ASSESSMENT   GENERAL OBSERVATION: pt does not wear glasses/contacts    OCULOMOTOR EXAM: Ocular Alignment: Cover/Uncover Test: WNL Cover/Cross Cover Test: WNL   Ocular ROM: No Limitations   Spontaneous Nystagmus: absent   Gaze-Induced Nystagmus: absent   Smooth Pursuits: intact   Saccades: intact   Convergence/Divergence: 3 cm    VESTIBULAR - OCULAR REFLEX:    Slow VOR: Normal   VOR Cancellation: Normal   Head-Impulse Test: HIT Right: covert positive HIT Left: covert positive  *pt reports less queasiness after testing   AUDITORY SCREEN:  Rinne Test intact B    POSITIONAL TESTING:  Right Roll Test: negative Left Roll Test: negative  Right Loaded Dix-Hallpike: negative Left Loaded Dix-Hallpike: negative                                                                                                                               TREATMENT DATE:  06/29/24   PATIENT EDUCATION: Education details: discussed Dr. Rojean  diagnosis of Meniere's disease and edu on benefits of vestibular rehab for Meniere's in between episodes, prognosis, POC, plans for next session  Person educated: Patient Education method: Explanation Education comprehension: verbalized understanding  HOME EXERCISE PROGRAM:   GOALS: Goals reviewed with patient? Yes  SHORT TERM GOALS: Target date: 07/20/2024  Patient to be independent with initial HEP. Baseline: HEP initiated Goal status: IN PROGRESS    LONG TERM GOALS: Target date: 08/10/2024  Patient to be independent with advanced HEP. Baseline: Not yet initiated  Goal status: IN PROGRESS  Patient to demonstrate mild-moderate sway with M-CTSIB condition with eyes closed/foam surface in order to improve safety in environments with uneven surfaces and dim lighting. Baseline: NT Goal status: IN PROGRESS  Patient to score at least 25/30 on FGA in order to decrease risk of falls. Baseline: NT Goal status: IN PROGRESS  Patient to score at least 18 points less on DHI in order to meet MCID and improve functional outcomes.  Baseline: 18 Goal status: IN PROGRESS   ASSESSMENT:  CLINICAL IMPRESSION: Patient arrived to session with report of reduced compliance with her HEP recently. Reviewed recent HEP update for max benefit and performance. Patient requires increased verbal and manual cueing and demo to improve confidence with activities. Patient benefits from cues to reduce pace and improve control as well as core/glute activation with balance tasks. No complaints at end of session.    OBJECTIVE IMPAIRMENTS: Abnormal gait, decreased balance, and dizziness.   ACTIVITY LIMITATIONS: all activities are limited during episode of dizziness  PARTICIPATION LIMITATIONS: all activity participation is limited during episode of dizziness  PERSONAL FACTORS: Age, Past/current experiences, and 3+ comorbidities: Angina, anxiety and depression, breast CA s/p B lumpectomy, CAD, HLD, HTN, MI are  also affecting patient's functional outcome.   REHAB POTENTIAL: Good  CLINICAL DECISION MAKING: Unstable/unpredictable  EVALUATION COMPLEXITY: Moderate   PLAN:  PT FREQUENCY: 1x/week  PT DURATION: 6 weeks  PLANNED INTERVENTIONS: 97164- PT Re-evaluation, 97110-Therapeutic exercises, 97530- Therapeutic activity, 97112- Neuromuscular re-education, 97535- Self Care, 02859- Manual therapy, [phone]- Gait training, [phone]- Canalith repositioning, Patient/Family education, Balance training, Stair training, Taping, Vestibular training, DME instructions, Cryotherapy, and Moist heat  PLAN FOR NEXT SESSION:  review HEP for balance and habituation if needed; MSQ? work on improving balance confidence with stairs, especially when carrying items; discuss exercise classes to maintain fitness   Louana Terrilyn Christians, PT, DPT 07/26/24 11:46 AM  [facility] at [facility] [address] [place], [place] [address] Phone # [phone] Fax # [phone]

## 2024-07-26 ENCOUNTER — Encounter: Payer: Self-pay | Admitting: Physical Therapy

## 2024-07-26 ENCOUNTER — Ambulatory Visit: Admitting: Physical Therapy

## 2024-07-26 DIAGNOSIS — R42 Dizziness and giddiness: Secondary | ICD-10-CM | POA: Diagnosis not present

## 2024-08-01 NOTE — Therapy (Signed)
 OUTPATIENT PHYSICAL THERAPY VESTIBULAR TREATMENT     Patient Name: Erika Brown MRN: [id number] DOB:1941/07/26, 83 y.o., female Today's Date: 08/01/2024  END OF SESSION:       Past Medical History:  Diagnosis Date   Anginal pain (HCC)    Anxiety and depression    Breast cancer (HCC)    Breast cancer (HCC)    CAD (coronary artery disease)    S/P NSTEMI 4/06 EF 60%; Treated with Cypher DES to LAD and RCA with kissing balloon PCI diagonal; Treadmill myoview  10/09 for CP: Walked 7:06. mild ST-T-wave changes in recovery. EF 71%. Very mild reversible defect in base of the inferior wall --> medical rx.   Cancer (HCC)    HISTORY OF SKIN CANCER   Cataract    Complication of anesthesia     DIFFICULT TO WAKE UP    Depression    Family history of breast cancer 04/01/2021   Family history of colon cancer 04/01/2021   Family history of malignant melanoma 04/01/2021   Glucose intolerance (impaired glucose tolerance)    History of echocardiogram    Echo 11/17: Vigorous LVF, EF 65-70, normal wall motion, borderline diastolic function, systolic bowing of mitral valve without prolapse, mild MR, trivial TR, PASP 20   History of medication noncompliance    Hyperlipidemia    Intolerate to multiple statins due to severe myalgias   Hypertension    Possible white-coat component   MI (myocardial infarction) (HCC)    acute-s/p stent   Microhematuria    Pneumonia due to COVID-19 virus    Psoriasis    Rapid heart beat    Vitamin D  deficiency    Past Surgical History:  Procedure Laterality Date   ABDOMINAL HYSTERECTOMY     and anterior repair   APPENDECTOMY     bladder tact     BREAST BIOPSY     BREAST LUMPECTOMY Right 05/21/2021   BREAST LUMPECTOMY WITH RADIOACTIVE SEED LOCALIZATION Left 05/21/2021   Procedure: LEFT BREAST LUMPECTOMY WITH RADIOACTIVE SEED LOCALIZATION;  Surgeon: Ebbie Cough, MD;  Location: [facility] OR;  Service: General;  Laterality: Left;   CARDIAC CATHETERIZATION   02/28/2018   CATARACT EXTRACTION Bilateral 2017   Dr. Octavia   CORONARY STENT INTERVENTION N/A 02/28/2018   Procedure: CORONARY STENT INTERVENTION;  Surgeon: Dann Candyce RAMAN, MD;  Location: [facility] INVASIVE CV LAB;  Service: Cardiovascular;  Laterality: N/A;   CORONARY STENT PLACEMENT     CORONARY STENT PLACEMENT  04/02/019   EYE SURGERY Bilateral    cataract   HEMORRHOID SURGERY     LEFT HEART CATH AND CORONARY ANGIOGRAPHY N/A 02/28/2018   Procedure: LEFT HEART CATH AND CORONARY ANGIOGRAPHY;  Surgeon: Dann Candyce RAMAN, MD;  Location[facility] INVASIVE CV LAB;  Service: Cardiovascular;  Laterality: N/A;   MOHS SURGERY  07/2013   BCC.  [facility]   RECTAL PROLAPSE REPAIR  1976   TONSILLECTOMY AND ADENOIDECTOMY     ULTRASOUND GUIDANCE FOR VASCULAR ACCESS  02/28/2018   Procedure: Ultrasound Guidance For Vascular Access;  Surgeon: Dann Candyce RAMAN, MD;  Location: [facility] INVASIVE CV LAB;  Service: Cardiovascular;;   Patient Active Problem List   Diagnosis Date Noted   Sensorineural hearing loss, unilateral, left ear, with unrestricted hearing on the contralateral side 05/12/2024   Dizziness 04/16/2024   Impacted cerumen of right ear 11/12/2023   Chronic rhinitis 11/12/2023   Hypertrophy of nasal turbinates 11/12/2023   Routine general medical examination at a health care facility 01/04/2023   Genetic  testing 04/15/2021   Family history of malignant melanoma 04/01/2021   Family history of breast cancer 04/01/2021   Family history of colon cancer 04/01/2021   Malignant neoplasm of upper-outer quadrant of left breast in female, estrogen receptor positive (HCC) 03/30/2021   Hypokalemia 03/10/2021   Pneumonia due to COVID-19 virus 12/20/2019   Hyponatremia 12/20/2019   Hypochloremia 12/20/2019   Vitamin D  deficiency, unspecified 11/07/2019   Abnormal stress test 02/28/2018   CAD (coronary artery disease)    PVC's (premature ventricular contractions) 02/10/2018   Palpitations 02/10/2018    Hyperlipidemia LDL goal <70 02/09/2009   Hypertension with heart disease 02/09/2009   Coronary artery disease involving native coronary artery of native heart without angina pectoris 02/09/2009    PCP: [place], Betty G, MD  REFERRING PROVIDER: Karis Clunes, MD  REFERRING DIAG: R42 (ICD-10-CM) - Dizziness  THERAPY DIAG:  No diagnosis found.  ONSET DATE: summer 2025   Rationale for Evaluation and Treatment: Rehabilitation  SUBJECTIVE:   SUBJECTIVE STATEMENT: I've been a bad student. Reports that she has only been working on her HEP when she feels like it. Reports that she might have black and blue marks on her back/bottom d/t walking into the counter with backwards walking.   Pt accompanied by: self  PERTINENT HISTORY: Per Dr. Karis- L Meniere's Disease Angina, anxiety and depression, breast CA s/p B lumpectomy, CAD, HLD, HTN, MI  PAIN:  Are you having pain? No  PRECAUTIONS: Fall  RED FLAGS: None   WEIGHT BEARING RESTRICTIONS: No  FALLS: Has patient fallen in last 6 months? No  LIVING ENVIRONMENT: Lives with: lives alone Lives in: House/apartment Stairs: 1 step to enter with railing; 2 story home with bedroom upstairs  Has following equipment at home: Vannie - 2 wheeled and Grab bars  PLOF: Independent and Vocation/Vocational requirements: retired  PATIENT GOALS: work on dizziness   OBJECTIVE:     TODAY'S TREATMENT: 08/02/24 Activity Comments                        TODAY'S TREATMENT: 07/26/24 Activity Comments  review of HEP update: romberg on foam + head turns/nods alt toe tap to 2 cones Cueing and practice slowing down. Cues to contract core and glutes to improve stability   fwd/back stepping + head turns/nods At TM rail and weaned UE support; mild-mod instability. Cueing for symmetrical step length   wall bumps EC on foam With EO and EC; pt required increased instruction to maintain intended sequence      PATIENT EDUCATION: Education details:  review of HEP and provided cueing for form; edu on ankle/hip/step/reaching strategies to correct LOB, edu on habit pairing to improve HEP consistency  Person educated: Patient Education method: Explanation, Demonstration, Tactile cues, and Verbal cues Education comprehension: verbalized understanding and returned demonstration    HOME EXERCISE PROGRAM Last updated: 07/11/24 Access Code: [email or web address] URL: [email or web address] Date: 07/11/2024 Prepared by: [facility] - Outpatient  Rehab - [facility]  Program Notes perform activities in a corner or at counter for safety  Exercises - Backward Walking with Counter Support  - 1 x daily - 5 x weekly - 2 sets - 5 reps - Walking with Eyes Closed and Counter Support  - 1 x daily - 5 x weekly - 2 sets - 5 reps - Tandem Walking with Counter Support  - 1 x daily - 5 x weekly - 2 sets - 5 reps - Romberg Stance on Foam Pad with Head Rotation  -  1 x daily - 5 x weekly - 2 sets - 30 sec hold - Corner Balance Feet Apart: Eyes Closed With Head Turns  - 1 x daily - 5 x weekly - 2 sets - 30 hold - Standing Toe Taps  - 1 x daily - 5 x weekly - 2 sets - 10 reps    Note: Objective measures were completed at Evaluation unless otherwise noted.  DIAGNOSTIC FINDINGS: 04/17/24 brain/IAC MRI: No cerebellopontine angle or internal auditory canal mass. 2. No etiology of hearing loss is identified. 3. Mild cerebral atrophy. 4. 2.9 cm left maxillary sinus mucous retention cyst.  COGNITION: Overall cognitive status: Within functional limits for tasks assessed   SENSATION: Pt denies N/T in UEs/LEs   POSTURE:  forward head  GAIT: Gait pattern: slightly decreased gait speed and WFL Assistive device utilized: None Level of assistance: Modified independence   PATIENT SURVEYS:  DHI: THE DIZZINESS HANDICAP INVENTORY [facility]): 18/100   DHI Scoring Instructions  The patient is asked to answer each question as it pertains to dizziness or  unsteadiness problems, specifically  considering their condition during the last month. Questions are designed to incorporate functional (F), physical  (P), and emotional (E) impacts on disability.   Scores greater than 10 points should be referred to balance specialists for further evaluation.   16-34 Points (mild handicap)  36-52 Points (moderate handicap)  54+ Points (severe handicap)  Minimally Detectable Change: 17 points ([address] [place], 1990)  [place], G. SHAUNNA. and [place], C. W. (1990). The development of the Dizziness Handicap Inventory. Archives of Otolaryngology - Head and Neck Surgery 116(4): [id number].   VESTIBULAR ASSESSMENT   GENERAL OBSERVATION: pt does not wear glasses/contacts    OCULOMOTOR EXAM: Ocular Alignment: Cover/Uncover Test: WNL Cover/Cross Cover Test: WNL   Ocular ROM: No Limitations   Spontaneous Nystagmus: absent   Gaze-Induced Nystagmus: absent   Smooth Pursuits: intact   Saccades: intact   Convergence/Divergence: 3 cm    VESTIBULAR - OCULAR REFLEX:    Slow VOR: Normal   VOR Cancellation: Normal   Head-Impulse Test: HIT Right: covert positive HIT Left: covert positive  *pt reports less queasiness after testing   AUDITORY SCREEN:  Rinne Test intact B    POSITIONAL TESTING:  Right Roll Test: negative Left Roll Test: negative  Right Loaded Dix-Hallpike: negative Left Loaded Dix-Hallpike: negative                                                                                                                               TREATMENT DATE: 06/29/24   PATIENT EDUCATION: Education details: discussed Dr. Rojean diagnosis of Meniere's disease and edu on benefits of vestibular rehab for Meniere's in between episodes, prognosis, POC, plans for next session  Person educated: Patient Education method: Explanation Education comprehension: verbalized understanding  HOME EXERCISE PROGRAM:   GOALS: Goals reviewed with patient? Yes  SHORT TERM  GOALS: Target date: 07/20/2024  Patient to be independent  with initial HEP. Baseline: HEP initiated Goal status: IN PROGRESS    LONG TERM GOALS: Target date: 08/10/2024  Patient to be independent with advanced HEP. Baseline: Not yet initiated  Goal status: IN PROGRESS  Patient to demonstrate mild-moderate sway with M-CTSIB condition with eyes closed/foam surface in order to improve safety in environments with uneven surfaces and dim lighting. Baseline: NT Goal status: IN PROGRESS  Patient to score at least 25/30 on FGA in order to decrease risk of falls. Baseline: NT Goal status: IN PROGRESS  Patient to score at least 18 points less on DHI in order to meet MCID and improve functional outcomes.  Baseline: 18 Goal status: IN PROGRESS   ASSESSMENT:  CLINICAL IMPRESSION: Patient arrived to session with report of reduced compliance with her HEP recently. Reviewed recent HEP update for max benefit and performance. Patient requires increased verbal and manual cueing and demo to improve confidence with activities. Patient benefits from cues to reduce pace and improve control as well as core/glute activation with balance tasks. No complaints at end of session.    OBJECTIVE IMPAIRMENTS: Abnormal gait, decreased balance, and dizziness.   ACTIVITY LIMITATIONS: all activities are limited during episode of dizziness  PARTICIPATION LIMITATIONS: all activity participation is limited during episode of dizziness  PERSONAL FACTORS: Age, Past/current experiences, and 3+ comorbidities: Angina, anxiety and depression, breast CA s/p B lumpectomy, CAD, HLD, HTN, MI are also affecting patient's functional outcome.   REHAB POTENTIAL: Good  CLINICAL DECISION MAKING: Unstable/unpredictable  EVALUATION COMPLEXITY: Moderate   PLAN:  PT FREQUENCY: 1x/week  PT DURATION: 6 weeks  PLANNED INTERVENTIONS: 97164- PT Re-evaluation, 97110-Therapeutic exercises, 97530- Therapeutic activity, 97112-  Neuromuscular re-education, 97535- Self Care, 02859- Manual therapy, [phone]- Gait training, [phone]- Canalith repositioning, Patient/Family education, Balance training, Stair training, Taping, Vestibular training, DME instructions, Cryotherapy, and Moist heat  PLAN FOR NEXT SESSION:  review HEP for balance and habituation if needed; MSQ? work on improving balance confidence with stairs, especially when carrying items; discuss exercise classes to maintain fitness   Louana Terrilyn Christians, PT, DPT 08/01/24 1:36 PM  [facility] [address] [place], [place] [address] Phone # [phone] Fax # [phone]

## 2024-08-02 ENCOUNTER — Ambulatory Visit: Attending: Otolaryngology | Admitting: Physical Therapy

## 2024-08-02 ENCOUNTER — Encounter: Payer: Self-pay | Admitting: Physical Therapy

## 2024-08-02 DIAGNOSIS — R42 Dizziness and giddiness: Secondary | ICD-10-CM | POA: Diagnosis not present

## 2024-08-09 ENCOUNTER — Ambulatory Visit: Admitting: Physical Therapy

## 2024-09-30 ENCOUNTER — Encounter: Payer: Self-pay | Admitting: Family Medicine

## 2024-10-03 ENCOUNTER — Telehealth: Payer: Self-pay

## 2024-10-03 NOTE — Telephone Encounter (Signed)
 Last seen on 01/06/24. I read message she sent and I think she should have been scheduled for OV.  This is Vung's daughter who is unable to text you at the moment due to dizziness. She had another very bad bout of vertigo today and had uncontrollable vomiting for quite some time.  This is her fourth episode in six months each time she is debilitated by the vomiting and dizziness. I was wondering if you could call in a prescription for sublingual Zofran  and Phenergan suppositories to have on hand to help control the vomiting when it occurs. In the past the anti emetics have worked, but she does not have a prescription for it now.    We are hoping to have a plan for her when and if it happens again because she is completely incapacitated when it occurs not even able to get to the door to open it for help unless someone has a key. Please let me know what you think.   Thanks, BJ

## 2024-10-03 NOTE — Telephone Encounter (Signed)
 Please advise.  Copied from CRM [id number]. Topic: Clinical - Medical Advice >> Oct 03, 2024  2:14 PM Brittany M wrote: Reason for CRM: Patient daughter calling to check on request and mychart message sent on 11/03 . Please reach out to daughter Esau Benders number is [phone]

## 2024-10-05 ENCOUNTER — Encounter: Payer: Self-pay | Admitting: Family Medicine

## 2024-10-05 ENCOUNTER — Ambulatory Visit: Admitting: Family Medicine

## 2024-10-05 VITALS — BP 166/80 | HR 80 | Temp 98.6°F | Resp 16 | Ht 63.5 in | Wt 141.8 lb

## 2024-10-05 DIAGNOSIS — R42 Dizziness and giddiness: Secondary | ICD-10-CM | POA: Diagnosis not present

## 2024-10-05 DIAGNOSIS — E538 Deficiency of other specified B group vitamins: Secondary | ICD-10-CM | POA: Insufficient documentation

## 2024-10-05 DIAGNOSIS — Z0189 Encounter for other specified special examinations: Secondary | ICD-10-CM

## 2024-10-05 DIAGNOSIS — R112 Nausea with vomiting, unspecified: Secondary | ICD-10-CM | POA: Diagnosis not present

## 2024-10-05 DIAGNOSIS — E876 Hypokalemia: Secondary | ICD-10-CM

## 2024-10-05 DIAGNOSIS — I119 Hypertensive heart disease without heart failure: Secondary | ICD-10-CM | POA: Diagnosis not present

## 2024-10-05 MED ORDER — ONDANSETRON 4 MG PO TBDP: 4.0000 mg | ORAL_TABLET | Freq: Two times a day (BID) | ORAL | 1 refills | Status: AC | PRN

## 2024-10-05 MED ORDER — TRIAMTERENE-HCTZ 37.5-25 MG PO TABS: 1.0000 | ORAL_TABLET | Freq: Every day | ORAL | 2 refills | Status: AC

## 2024-10-05 MED ORDER — PROMETHAZINE HCL 12.5 MG RE SUPP: 12.5000 mg | Freq: Two times a day (BID) | RECTAL | 0 refills | Status: AC | PRN

## 2024-10-05 MED ORDER — IRBESARTAN 300 MG PO TABS: 300.0000 mg | ORAL_TABLET | Freq: Every day | ORAL | 1 refills | Status: DC

## 2024-10-05 NOTE — Progress Notes (Signed)
 Chief Complaint  Patient presents with   Medical Management of Chronic Issues    Stop taking my vitamins- vitamin d , biotin about 2-3 wks.    Dizziness    Usually in the morning, a sense of nausea. Then dizzyness and vomits. Took meclizine  but throw it up. First episode started 8-9 years ago. 4-5 episode in May. Most recent one in July. Current happened this past Sunday.    Discussed the use of AI scribe software for clinical note transcription with the patient, who gave verbal consent to proceed. History of Present Illness Erika Brown is an 83 year old female with PMHx significant for CAD, HTN, HLD, vitamin D  deficiency, sensorineural hearing loss,and hx of breast cancer here today c/o intermittent episodes of nausea, vomiting, and dizziness as described above. Her daughter is on speaker during visit.  She has been experiencing intermittent episodes of nausea, vomiting, and dizziness since May/2025. These episodes begin with a sensation of unease in her stomach, followed by nausea and vomiting, and then severe dizziness. The dizziness is so intense that she is unable to walk and must lay down. During these episodes, her she feels like her eyes move back and forth, and she cannot focus. The most recent episode occurred on Sunday, with a previous episode on July 9th, totaling four episodes since May. She has been unable to stop vomiting during these episodes, requiring assistance from neighbors, including a nurse.  Meclizine  was tried for dizziness but was ineffective due to vomiting.  She notes receiving a warning before the onset of dizziness, allowing her to lay down before symptoms worsen. A friend provided a suppository of phenergan and a tab under her tongue during a severe episode. Requesting Rx for phenergan suppository and Zofran .  She reports that her hearing has worsened since September, particularly in the left ear. She received hearing aids in September and notes further  deterioration in hearing since the last episode.  No tinnitus is reported.  She feels very tired over the past several months.  She has seen ENT for these symptoms and per pt,she was told her it could be Meniere's disease.  Brain MRI done 04/19/24: 1. No cerebellopontine angle or internal auditory canal mass. 2. No etiology of hearing loss is identified. 3. Mild cerebral atrophy. 4. 2.9 cm left maxillary sinus mucous retention cyst.  Hypertension: Currently on amlodipine  5 mg daily, Avalide 300-12.5 mg daily, and metoprolol  succinate 50 mg daily.  BP elevated today, report lower readings at home. Negative for unusual or severe headache, visual changes, exertional chest pain, dyspnea,  focal weakness, or edema.   Lab Results  Component Value Date   NA 141 01/06/2024   CL 104 01/06/2024   K 4.0 01/06/2024   CO2 26 01/06/2024   BUN 22 01/06/2024   CREATININE 0.84 01/06/2024   GFR 63.98 01/06/2024   CALCIUM 9.8 01/06/2024   ALBUMIN 4.1 01/06/2024   GLUCOSE 99 01/06/2024   B12 deficiency: She is not on B12 supplementation. Lab Results  Component Value Date   VITAMINB12 293 02/04/2023   Her daughter is requesting HgA1C.  Review of Systems  Constitutional:  Positive for activity change and fatigue. Negative for appetite change, chills and fever.  HENT:  Negative for mouth sores and sore throat.   Respiratory:  Negative for cough and wheezing.   Endocrine: Negative for cold intolerance and heat intolerance.  Genitourinary:  Negative for decreased urine volume, dysuria and hematuria.  Skin:  Negative for rash.  Neurological:  Negative for syncope and facial asymmetry.  Psychiatric/Behavioral:  Negative for confusion and hallucinations.   See other pertinent positives and negatives in HPI.  Current Outpatient Medications on File Prior to Visit  Medication Sig Dispense Refill   amLODipine  (NORVASC ) 5 MG tablet Take 1 tablet (5 mg total) by mouth daily. 90 tablet 3   aspirin  81 MG  EC tablet Take 81 mg by mouth daily.     Biotin 5000 MCG TABS Take 1 tablet by mouth daily.     Cholecalciferol (VITAMIN D3) 125 MCG (5000 UT) CAPS Take 5,000 Units by mouth daily.     clopidogrel  (PLAVIX ) 75 MG tablet Take 1 tablet (75 mg total) by mouth daily. 90 tablet 3   irbesartan -hydrochlorothiazide  (AVALIDE) 300-12.5 MG tablet Take 1 tablet by mouth daily. 90 tablet 3   metoprolol  succinate (TOPROL -XL) 50 MG 24 hr tablet Take 1 tablet (50 mg total) by mouth daily. Take with or immediately following a meal. 90 tablet 3   nitroGLYCERIN  (NITROSTAT ) 0.4 MG SL tablet Place 1 tablet (0.4 mg total) under the tongue every 5 (five) minutes as needed for chest pain. 75 tablet 1   potassium chloride  SA (KLOR-CON  M) 20 MEQ tablet Take 1 tablet (20 mEq total) by mouth daily. 90 tablet 3   predniSONE  (STERAPRED UNI-PAK 21 TAB) 10 MG (21) TBPK tablet Per instructions (6,5,4,3,2,1) (Patient not taking: Reported on 06/29/2024) 21 tablet 0   vitamin C (ASCORBIC ACID ) 500 MG tablet Take 500 mg by mouth daily.     zinc  gluconate 50 MG tablet Take 50 mg by mouth daily.     No current facility-administered medications on file prior to visit.    Past Medical History:  Diagnosis Date   Anginal pain    Anxiety and depression    Breast cancer (HCC)    Breast cancer (HCC)    CAD (coronary artery disease)    S/P NSTEMI 4/06 EF 60%; Treated with Cypher DES to LAD and RCA with kissing balloon PCI diagonal; Treadmill myoview  10/09 for CP: Walked 7:06. mild ST-T-wave changes in recovery. EF 71%. Very mild reversible defect in base of the inferior wall --> medical rx.   Cancer (HCC)    HISTORY OF SKIN CANCER   Cataract    Complication of anesthesia     DIFFICULT TO WAKE UP    Depression    Family history of breast cancer 04/01/2021   Family history of colon cancer 04/01/2021   Family history of malignant melanoma 04/01/2021   Glucose intolerance (impaired glucose tolerance)    History of echocardiogram     Echo 11/17: Vigorous LVF, EF 65-70, normal wall motion, borderline diastolic function, systolic bowing of mitral valve without prolapse, mild MR, trivial TR, PASP 20   History of medication noncompliance    Hyperlipidemia    Intolerate to multiple statins due to severe myalgias   Hypertension    Possible white-coat component   MI (myocardial infarction) (HCC)    acute-s/p stent   Microhematuria    Pneumonia due to COVID-19 virus    Psoriasis    Rapid heart beat    Vitamin D  deficiency    Allergies  Allergen Reactions   Iodine Shortness Of Breath and Palpitations    Has had some SHOB and rapid heart rate in the past   Latex Other (See Comments)    discomfort   Other Other (See Comments)    Some foods b/c of preservatives    Statins Other (  See Comments)    Intolerance to high dosages, myalgia    Social History   Socioeconomic History   Marital status: Widowed    Spouse name: Not on file   Number of children: 7   Years of education: Not on file   Highest education level: Not on file  Occupational History   Occupation: Works at home, designer, fashion/clothing, 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  Tobacco Use   Smoking status: Never   Smokeless tobacco: Never   Tobacco comments:    only a few times  Vaping Use   Vaping status: Never Used  Substance and Sexual Activity   Alcohol  use: Yes    Alcohol /week: 4.0 standard drinks of alcohol     Types: 4 Glasses of wine per week   Drug use: No   Sexual activity: Not Currently    Birth control/protection: Abstinence, Surgical    Comment: TVH  Other Topics Concern   Not on file  Social History Narrative   Lives in [place] with her husband   Seven kids   Plays tennis   Very good card player   Social Drivers of Health   Financial Resource Strain: Low Risk  (12/28/2023)   Overall Financial Resource Strain (CARDIA)    Difficulty of Paying Living Expenses: Not hard at all  Food Insecurity: No Food Insecurity (12/28/2023)   Hunger Vital Sign    Worried  About Running Out of Food in the Last Year: Never true    Ran Out of Food in the Last Year: Never true  Transportation Needs: No Transportation Needs (12/28/2023)   PRAPARE - Administrator, Civil Service (Medical): No    Lack of Transportation (Non-Medical): No  Physical Activity: Inactive (12/28/2023)   Exercise Vital Sign    Days of Exercise per Week: 0 days    Minutes of Exercise per Session: 0 min  Stress: No Stress Concern Present (12/28/2023)   [employer] of Occupational Health - Occupational Stress Questionnaire    Feeling of Stress : Not at all  Social Connections: Moderately Integrated (12/28/2023)   Social Connection and Isolation Panel    Frequency of Communication with Friends and Family: More than three times a week    Frequency of Social Gatherings with Friends and Family: More than three times a week    Attends Religious Services: More than 4 times per year    Active Member of [employer] or Organizations: Yes    Attends Banker Meetings: More than 4 times per year    Marital Status: Widowed   Today's Vitals   10/05/24 1422  BP: (!) 166/80  Pulse: 80  Resp: 16  Temp: 98.6 F (37 C)  TempSrc: Oral  SpO2: 97%  Weight: 141 lb 12.8 oz (64.3 kg)  Height: 5' 3.5 (1.613 m)   Body mass index is 24.72 kg/m.  Physical Exam Vitals and nursing note reviewed.  Constitutional:      General: She is not in acute distress.    Appearance: She is well-developed.  HENT:     Head: Normocephalic and atraumatic.     Right Ear: Tympanic membrane, ear canal and external ear normal.     Left Ear: External ear normal. Tympanic membrane is not erythematous.     Ears:     Comments: Small amount of cerumen left, can see TM partially.    Mouth/Throat:     Mouth: Mucous membranes are moist.     Pharynx: Oropharynx is clear.  Eyes:  Conjunctiva/sclera: Conjunctivae normal.  Cardiovascular:     Rate and Rhythm: Normal rate. Rhythm irregular.     Heart  sounds: No murmur heard. Pulmonary:     Effort: Pulmonary effort is normal. No respiratory distress.     Breath sounds: Normal breath sounds.  Abdominal:     Palpations: Abdomen is soft. There is no hepatomegaly or mass.     Tenderness: There is no abdominal tenderness.  Musculoskeletal:     Right lower leg: No edema.     Left lower leg: No edema.  Lymphadenopathy:     Cervical: No cervical adenopathy.  Skin:    General: Skin is warm.     Findings: No erythema or rash.  Neurological:     General: No focal deficit present.     Mental Status: She is alert and oriented to person, place, and time.     Cranial Nerves: No cranial nerve deficit.     Gait: Gait normal.  Psychiatric:        Mood and Affect: Mood and affect normal.    ASSESSMENT AND PLAN:  Ms. Fiebelkorn was seen today for intermittent episodes of dizziness, nausea,and vomiitng sicne 03/2024. Lab Results  Component Value Date   HGBA1C 5.8 (H) 10/05/2024   Lab Results  Component Value Date   VITAMINB12 293 02/04/2023   Lab Results  Component Value Date   NA 138 10/05/2024   CL 101 10/05/2024   K 4.1 10/05/2024   CO2 28 10/05/2024   BUN 23 10/05/2024   CREATININE 0.92 10/05/2024   EGFR 61 10/05/2024   CALCIUM 10.2 10/05/2024   ALBUMIN 4.1 01/06/2024   GLUCOSE 106 (H) 10/05/2024   Lab Results  Component Value Date   WBC 7.0 10/05/2024   HGB 13.3 10/05/2024   HCT 41.3 10/05/2024   MCV 83.4 10/05/2024   PLT 247 10/05/2024   Dizziness Assessment & Plan: We discussed possible etiologies. Associated with severe N/V and hearing loss, no tinnitus. Brain MRI done in 03/2024 negative for cerebellopontine angle or internal auditory canal mass. She has an appt with ENT in 10/2024. Agrees with tral of Triamterene-hydrochlorothiazide  37.5-25 mg daily. Some side effects discussed. Fall precautions discussed.  Orders: -     Basic metabolic panel with GFR; Future -     CBC; Future -     Ondansetron ; Take 1 tablet (4 mg  total) by mouth 2 (two) times daily as needed for nausea or vomiting.  Dispense: 20 tablet; Refill: 1 -     Promethazine HCl; Place 1 suppository (12.5 mg total) rectally every 12 (twelve) hours as needed for nausea or vomiting.  Dispense: 30 each; Refill: 0 -     Basic metabolic panel with GFR; Future -     CBC with Differential/Platelet; Future  Hypokalemia Assessment & Plan: Currently she is on KLOR 20 meq daily. Last K+4.0 in 12/2023. Triamterene-hydrochlorothiazide  started today. Depending of labs today, will recommend stopping KLOR.   Hypertension with heart disease Assessment & Plan: BP elevated today, reports lower readings at home. Because I am recommending Triamterene-hydrochlorothiazide  for possible meniere's disease management, Irbesartan -hydrochlorothiazide  discontinued, continue Irbesartan  300 mg daily as well as Amlodipine  5 mg daily,and Metoprolol  Succinate 50 mg daily as well as low salt diet. Continue monitoring BP regularly. Instructed about warning signs. F/U in 2 months.  Orders: -     Irbesartan ; Take 1 tablet (300 mg total) by mouth daily.  Dispense: 30 tablet; Refill: 1 -     Triamterene-HCTZ; Take 1  tablet by mouth daily.  Dispense: 30 tablet; Refill: 2 -     Basic metabolic panel with GFR; Future -     Basic metabolic panel with GFR; Future  B12 deficiency Assessment & Plan: Mild. She is not on B12 supplementation. Further recommendations according to B12 result.  Nausea and vomiting in adult Severe intermittent episodes since 03/2024 associated with dizziness. Ondansetron  4 mg and phenergan suppository for symptoms management. Instructed clearly about warning signs.  -     Ondansetron ; Take 1 tablet (4 mg total) by mouth 2 (two) times daily as needed for nausea or vomiting.  Dispense: 20 tablet; Refill: 1 -     Promethazine HCl; Place 1 suppository (12.5 mg total) rectally every 12 (twelve) hours as needed for nausea or vomiting.  Dispense: 30 each;  Refill: 0  Patient request for diagnostic testing -     Hemoglobin A1c; Future -     Hemoglobin A1c; Future   I personally spent a total of 45 minutes in the care of the patient today including preparing to see the patient, getting/reviewing separately obtained history, performing a medically appropriate exam/evaluation, counseling and educating, placing orders, and documenting clinical information in the EHR.  Return in about 2 months (around 12/05/2024) for chronic problems.   Marquisa Salih G. Anden Bartolo, MD  [facility]. [place] office.

## 2024-10-05 NOTE — Patient Instructions (Addendum)
 A few things to remember from today's visit:  Hypertension with heart disease - Plan: irbesartan  (AVAPRO ) 300 MG tablet, triamterene-hydrochlorothiazide  (MAXZIDE-25) 37.5-25 MG tablet, Basic metabolic panel with GFR  Dizziness - Plan: Basic metabolic panel with GFR, CBC, ondansetron  (ZOFRAN -ODT) 4 MG disintegrating tablet, promethazine (PHENERGAN) 12.5 MG suppository  B12 deficiency  Patient request for diagnostic testing - Plan: Hemoglobin A1c  Nausea and vomiting in adult - Plan: ondansetron  (ZOFRAN -ODT) 4 MG disintegrating tablet, promethazine (PHENERGAN) 12.5 MG suppository  Fall precautions.  If you need refills for medications you take chronically, please call your pharmacy. Do not use My Chart to request refills or for acute issues that need immediate attention. If you send a my chart message, it may take a few days to be addressed, specially if I am not in the office.  Please be sure medication list is accurate. If a new problem present, please set up appointment sooner than planned today.

## 2024-10-06 ENCOUNTER — Ambulatory Visit: Payer: Self-pay | Admitting: Family Medicine

## 2024-10-06 DIAGNOSIS — E876 Hypokalemia: Secondary | ICD-10-CM

## 2024-10-06 LAB — CBC WITH DIFFERENTIAL/PLATELET
Absolute Lymphocytes: 2219 {cells}/uL (ref 850–3900)
Absolute Monocytes: 714 {cells}/uL (ref 200–950)
Basophils Absolute: 63 {cells}/uL (ref 0–200)
Basophils Relative: 0.9 %
Eosinophils Absolute: 343 {cells}/uL (ref 15–500)
Eosinophils Relative: 4.9 %
HCT: 41.3 % (ref 35.0–45.0)
Hemoglobin: 13.3 g/dL (ref 11.7–15.5)
MCH: 26.9 pg — ABNORMAL LOW (ref 27.0–33.0)
MCHC: 32.2 g/dL (ref 32.0–36.0)
MCV: 83.4 fL (ref 80.0–100.0)
MPV: 9.7 fL (ref 7.5–12.5)
Monocytes Relative: 10.2 %
Neutro Abs: 3661 {cells}/uL (ref 1500–7800)
Neutrophils Relative %: 52.3 %
Platelets: 247 Thousand/uL (ref 140–400)
RBC: 4.95 Million/uL (ref 3.80–5.10)
RDW: 12.6 % (ref 11.0–15.0)
Total Lymphocyte: 31.7 %
WBC: 7 Thousand/uL (ref 3.8–10.8)

## 2024-10-06 LAB — HEMOGLOBIN A1C
Hgb A1c MFr Bld: 5.8 % — ABNORMAL HIGH (ref <5.7)
Mean Plasma Glucose: 120 mg/dL
eAG (mmol/L): 6.6 mmol/L

## 2024-10-06 NOTE — Assessment & Plan Note (Signed)
 Mild. She is not on B12 supplementation. Further recommendations according to B12 result.

## 2024-10-06 NOTE — Assessment & Plan Note (Signed)
 We discussed possible etiologies. Associated with severe N/V and hearing loss, no tinnitus. Brain MRI done in 03/2024 negative for cerebellopontine angle or internal auditory canal mass. She has an appt with ENT in 10/2024. Agrees with tral of Triamterene-hydrochlorothiazide  37.5-25 mg daily. Some side effects discussed. Fall precautions discussed.

## 2024-10-06 NOTE — Assessment & Plan Note (Addendum)
 BP elevated today, reports lower readings at home. Because I am recommending Triamterene-hydrochlorothiazide  for possible meniere's disease management, Irbesartan -hydrochlorothiazide  discontinued, continue Irbesartan  300 mg daily as well as Amlodipine  5 mg daily,and Metoprolol  Succinate 50 mg daily as well as low salt diet. Continue monitoring BP regularly. Instructed about warning signs. F/U in 2 months.

## 2024-10-06 NOTE — Assessment & Plan Note (Signed)
 Currently she is on KLOR 20 meq daily. Last K+4.0 in 12/2023. Triamterene-hydrochlorothiazide  started today. Depending of labs today, will recommend stopping KLOR.

## 2024-11-01 ENCOUNTER — Other Ambulatory Visit (INDEPENDENT_AMBULATORY_CARE_PROVIDER_SITE_OTHER)

## 2024-11-01 DIAGNOSIS — E876 Hypokalemia: Secondary | ICD-10-CM

## 2024-11-01 LAB — BASIC METABOLIC PANEL WITH GFR
BUN: 28 mg/dL — ABNORMAL HIGH (ref 6–23)
CO2: 29 meq/L (ref 19–32)
Calcium: 10 mg/dL (ref 8.4–10.5)
Chloride: 98 meq/L (ref 96–112)
Creatinine, Ser: 1.18 mg/dL (ref 0.40–1.20)
GFR: 42.3 mL/min — ABNORMAL LOW (ref >60.00)
Glucose, Bld: 89 mg/dL (ref 70–99)
Potassium: 3.7 meq/L (ref 3.5–5.1)
Sodium: 136 meq/L (ref 135–145)

## 2024-11-02 ENCOUNTER — Other Ambulatory Visit

## 2024-11-05 ENCOUNTER — Ambulatory Visit: Payer: Self-pay | Admitting: Family Medicine

## 2024-11-09 ENCOUNTER — Ambulatory Visit (INDEPENDENT_AMBULATORY_CARE_PROVIDER_SITE_OTHER): Admitting: Otolaryngology

## 2024-11-30 NOTE — Progress Notes (Signed)
 RIM THATCH                                          MRN: [id number]   11/30/2024   The [facility] Quality Team Specialist reviewed this patient medical record for the purposes of chart review for care gap closure. The following were reviewed: chart review for care gap closure-controlling blood pressure.    [facility] Quality Team

## 2024-12-03 ENCOUNTER — Encounter (INDEPENDENT_AMBULATORY_CARE_PROVIDER_SITE_OTHER): Payer: Self-pay | Admitting: Otolaryngology

## 2024-12-03 ENCOUNTER — Ambulatory Visit (INDEPENDENT_AMBULATORY_CARE_PROVIDER_SITE_OTHER): Admitting: Otolaryngology

## 2024-12-03 VITALS — BP 108/70 | HR 66 | Ht 63.5 in | Wt 143.0 lb

## 2024-12-03 DIAGNOSIS — H9042 Sensorineural hearing loss, unilateral, left ear, with unrestricted hearing on the contralateral side: Secondary | ICD-10-CM

## 2024-12-03 DIAGNOSIS — H903 Sensorineural hearing loss, bilateral: Secondary | ICD-10-CM | POA: Diagnosis not present

## 2024-12-03 DIAGNOSIS — H6122 Impacted cerumen, left ear: Secondary | ICD-10-CM | POA: Diagnosis not present

## 2024-12-03 DIAGNOSIS — H8102 Meniere's disease, left ear: Secondary | ICD-10-CM | POA: Diagnosis not present

## 2024-12-03 DIAGNOSIS — R42 Dizziness and giddiness: Secondary | ICD-10-CM

## 2024-12-03 NOTE — Progress Notes (Signed)
 Patient ID: Erika Brown, female   DOB: [birth year], 84 y.o.   MRN: [id number]  Follow up: Recurrent dizziness, asymmetric left ear hearing loss, left ear Mnire's disease  History of Present Illness Erika Brown is an 84 year old female with left-sided Mnire's disease and bilateral (left greater than right) sensorineural hearing loss who presents for follow-up of recurrent vertigo and hearing loss.  Over the past six months, she has experienced three episodes of severe vertigo, each characterized by a spinning sensation and profuse vomiting, typically occurring in the morning upon awakening. The most recent and most severe episode occurred in late November, during which she was unable to retain oral meclizine  due to persistent emesis and required sublingual ondansetron  and a rectal antiemetic, which provided partial relief. All episodes occurred either at home or while waking up in bed, including one episode while on vacation.  She recently obtained hearing aids for both ears, which have significantly improved her ability to hear, particularly in noisy environments and with lower-pitched voices. She continues to have difficulty discerning higher-pitched voices despite the devices. She uses wax remover as needed and denies difficulty hearing lower-pitched voices.  She adheres to a low salt diet, generally avoiding added salt, but reports a recent dietary lapse during her daughter's postoperative recovery, when she consumed foods with higher salt content, which she believes contributed to her November vertigo episode. She has eliminated caffeine and attempts to maintain adequate hydration, typically drinking six or more glasses of water or mint tea daily. She denies other neurological symptoms, weight loss, fevers, chills, or sweats.   Exam: General: Communicates without difficulty, well nourished, no acute distress. Head: Normocephalic, no evidence injury, no tenderness, facial buttresses intact  without stepoff. Face/sinus: No tenderness to palpation and percussion. Facial movement is normal and symmetric. Eyes: PERRL, EOMI. No scleral icterus, conjunctivae clear. Neuro: CN II exam reveals vision grossly intact.  No nystagmus at any point of gaze. Ears: Auricles well formed without lesions.  Left ear cerumen impaction.  Nose: External evaluation reveals normal support and skin without lesions.  Dorsum is intact.  Anterior rhinoscopy reveals congested mucosa over anterior aspect of inferior turbinates and intact septum.  No purulence noted. Oral:  Oral cavity and oropharynx are intact, symmetric, without erythema or edema.  Mucosa is moist without lesions. Neck: Full range of motion without pain.  There is no significant lymphadenopathy.  No masses palpable.  Thyroid  bed within normal limits to palpation.  Parotid glands and submandibular glands equal bilaterally without mass.  Trachea is midline. Neuro:  CN 2-12 grossly intact. Vestibular: No nystagmus at any point of gaze. Vestibular: There is no nystagmus with pneumatic pressure on either tympanic membrane or Valsalva. The cerebellar examination is unremarkable.   Procedure: Left ear cerumen disimpaction Anesthesia: None Description: Under the operating microscope, the cerumen is carefully removed with a combination of cerumen currette, alligator forceps, and suction catheters.  After the cerumen is removed, the TMs are noted to be normal.  No mass, erythema, or lesions. The patient tolerated the procedure well.   Assessment & Plan Recurrent dizziness, left ear Mnire's disease Chronic left ear Mnire's disease with three vertigo episodes in the past six months, including one severe episode with emesis. Episodes correlate with dietary indiscretions, particularly increased sodium and caffeine intake. Current attack frequency is tolerable and less than typically seen in candidates for surgical intervention. Hearing remains stable. Endolymphatic sac  decompression was discussed for refractory or frequent episodes but deferred due to  current episode frequency.  - Reinforced adherence to a low sodium diet (target ~1500 mg/day). - Provided education regarding the impact of dietary change, especially when dining out. - Discussed endolymphatic sac decompression as an option for refractory or frequent episodes; deferred at this time. - Reviewed risks and benefits of surgical intervention, including general anesthesia and low risk of hearing loss. - Recommended maintaining adequate hydration. - Scheduled follow-up in six months with instructions to notify the clinic if vertigo episodes increase in frequency or severity.  Sensorineural hearing loss, bilateral Bilateral sensorineural hearing loss with significant improvement using bilateral hearing aids. - Encouraged continued use of bilateral hearing aids. - Performed ear canal cleaning and cerumen removal to optimize hearing aid function. - Scheduled follow-up in six months.  Left ear cerumen impaction - Otomicroscopy with left ear cerumen disimpaction.

## 2024-12-05 ENCOUNTER — Other Ambulatory Visit: Payer: Self-pay

## 2024-12-05 ENCOUNTER — Encounter: Payer: Self-pay | Admitting: Family Medicine

## 2024-12-05 ENCOUNTER — Ambulatory Visit (INDEPENDENT_AMBULATORY_CARE_PROVIDER_SITE_OTHER): Admitting: Family Medicine

## 2024-12-05 ENCOUNTER — Telehealth: Payer: Self-pay | Admitting: *Deleted

## 2024-12-05 ENCOUNTER — Telehealth: Payer: Self-pay

## 2024-12-05 ENCOUNTER — Ambulatory Visit: Payer: Self-pay | Admitting: Family Medicine

## 2024-12-05 VITALS — BP 122/78 | HR 65 | Temp 97.9°F | Resp 16 | Ht 63.5 in | Wt 134.8 lb

## 2024-12-05 DIAGNOSIS — I119 Hypertensive heart disease without heart failure: Secondary | ICD-10-CM

## 2024-12-05 DIAGNOSIS — R634 Abnormal weight loss: Secondary | ICD-10-CM

## 2024-12-05 DIAGNOSIS — R42 Dizziness and giddiness: Secondary | ICD-10-CM | POA: Diagnosis not present

## 2024-12-05 DIAGNOSIS — R110 Nausea: Secondary | ICD-10-CM

## 2024-12-05 LAB — BASIC METABOLIC PANEL WITH GFR
BUN: 34 mg/dL — ABNORMAL HIGH (ref 6–23)
CO2: 27 meq/L (ref 19–32)
Calcium: 10.5 mg/dL (ref 8.4–10.5)
Chloride: 101 meq/L (ref 96–112)
Creatinine, Ser: 1.21 mg/dL — ABNORMAL HIGH (ref 0.40–1.20)
GFR: 41.02 mL/min — ABNORMAL LOW
Glucose, Bld: 97 mg/dL (ref 70–99)
Potassium: 3.6 meq/L (ref 3.5–5.1)
Sodium: 137 meq/L (ref 135–145)

## 2024-12-05 LAB — CBC
HCT: 38.6 % (ref 36.0–46.0)
Hemoglobin: 12.5 g/dL (ref 12.0–15.0)
MCHC: 32.4 g/dL (ref 30.0–36.0)
MCV: 82.7 fl (ref 78.0–100.0)
Platelets: 273 K/uL (ref 150.0–400.0)
RBC: 4.67 Mil/uL (ref 3.87–5.11)
RDW: 13.4 % (ref 11.5–15.5)
WBC: 6 K/uL (ref 4.0–10.5)

## 2024-12-05 MED ORDER — IRBESARTAN 300 MG PO TABS: 300.0000 mg | ORAL_TABLET | Freq: Every day | ORAL | 2 refills | Status: AC

## 2024-12-05 NOTE — Progress Notes (Signed)
 "   Chief Complaint  Patient presents with   Medical Management of Chronic Issues   Discussed the use of AI scribe software for clinical note transcription with the patient, who gave verbal consent to proceed.  History of Present Illness Erika Brown is an 84 year old female whowith PMHx significant for CAD, HTN, HLD, vitamin D  deficiency, sensorineural hearing loss,and hx of breast cancer who is here today for chronic disease management. Last seen on 10/05/24, when she was seen for acute episode of dizziness.  At this time triamterene -HCTZ 37.5-25 mg was added. She has not needed Zofran  or meclizine  recently, although she took half a dose of meclizine  on November 1st, resulting in prolonged drowsiness, slept for 20 hours.  Since her last visit, she has seen her ENT, 12/03/24.  She has not had episode of dizziness since her last visit. She has increased her water intake and reduced her salt consumption.  Triamterene -hydrochlorothiazide  was decreased because abnormal e GFR.  Today she is complaining of new onset of nausea, which is different to nausea associated with dizziness. She experiences nausea, which sometimes improves temporarily after eating but then returns. There is a decreased appetite, and she has been eating less, including reduced sugar intake. Her weight has decreased from 142 pounds on November 7th to 133.9 pounds currently. No vomiting, abdominal pain, melena, blood in the stool, changes in bowel habits, or heartburn are present. Negative for dysphagia but she describes a sensation of feeling her food going down her stomach sometimes.  She has been careful with her diet, avoiding caffeine and chocolate, and consuming nuts and seeds for protein.    Hypertension: Currently she is on triamterene -HCTZ 37.5-25 mg one half daily, amlodipine  5 mg daily, irbesartan  300 mg daily, and metoprolol  succinate 50 mg daily. She has not been monitoring BP recently. Negative for unusual or  severe headache, visual changes, exertional chest pain, dyspnea,  focal weakness, or edema.  Lab Results  Component Value Date   CREATININE 1.18 11/01/2024   BUN 28 (H) 11/01/2024   NA 136 11/01/2024   K 3.7 11/01/2024   CL 98 11/01/2024   CO2 29 11/01/2024   Lab Results  Component Value Date   WBC 7.0 10/05/2024   HGB 13.3 10/05/2024   HCT 41.3 10/05/2024   MCV 83.4 10/05/2024   PLT 247 10/05/2024   Lab Results  Component Value Date   TSH 1.56 01/06/2024   Review of Systems  Constitutional:  Positive for appetite change. Negative for activity change, chills and fever.  HENT:  Negative for sore throat.   Respiratory:  Negative for cough and wheezing.   Endocrine: Negative for cold intolerance and heat intolerance.  Genitourinary:  Negative for decreased urine volume, dysuria and hematuria.  Skin:  Negative for rash.  Neurological:  Negative for syncope and facial asymmetry.  Psychiatric/Behavioral:  Negative for confusion and hallucinations.   See other pertinent positives and negatives in HPI.  Medications Ordered Prior to Encounter[1]  Past Medical History:  Diagnosis Date   Anginal pain    Anxiety and depression    Breast cancer (HCC)    Breast cancer (HCC)    CAD (coronary artery disease)    S/P NSTEMI 4/06 EF 60%; Treated with Cypher DES to LAD and RCA with kissing balloon PCI diagonal; Treadmill myoview  10/09 for CP: Walked 7:06. mild ST-T-wave changes in recovery. EF 71%. Very mild reversible defect in base of the inferior wall --> medical rx.   Cancer (HCC)  HISTORY OF SKIN CANCER   Cataract    Complication of anesthesia     DIFFICULT TO WAKE UP    Depression    Family history of breast cancer 04/01/2021   Family history of colon cancer 04/01/2021   Family history of malignant melanoma 04/01/2021   Glucose intolerance (impaired glucose tolerance)    History of echocardiogram    Echo 11/17: Vigorous LVF, EF 65-70, normal wall motion, borderline  diastolic function, systolic bowing of mitral valve without prolapse, mild MR, trivial TR, PASP 20   History of medication noncompliance    Hyperlipidemia    Intolerate to multiple statins due to severe myalgias   Hypertension    Possible white-coat component   MI (myocardial infarction) (HCC)    acute-s/p stent   Microhematuria    Pneumonia due to COVID-19 virus    Psoriasis    Rapid heart beat    Vitamin D  deficiency    Allergies[2]  Social History   Socioeconomic History   Marital status: Widowed    Spouse name: Not on file   Number of children: 7   Years of education: Not on file   Highest education level: Not on file  Occupational History   Occupation: Works at home, designer, fashion/clothing, 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  Tobacco Use   Smoking status: Never   Smokeless tobacco: Never   Tobacco comments:    only a few times  Vaping Use   Vaping status: Never Used  Substance and Sexual Activity   Alcohol  use: Yes    Alcohol /week: 4.0 standard drinks of alcohol     Types: 4 Glasses of wine per week   Drug use: No   Sexual activity: Not Currently    Birth control/protection: Abstinence, Surgical    Comment: TVH  Other Topics Concern   Not on file  Social History Narrative   Lives in [place] with her husband   Seven kids   Plays tennis   Very good card player   Social Drivers of Health   Tobacco Use: Low Risk (12/05/2024)   Patient History    Smoking Tobacco Use: Never    Smokeless Tobacco Use: Never    Passive Exposure: Not on file  Financial Resource Strain: Low Risk (12/28/2023)   Overall Financial Resource Strain (CARDIA)    Difficulty of Paying Living Expenses: Not hard at all  Food Insecurity: No Food Insecurity (12/28/2023)   Hunger Vital Sign    Worried About Running Out of Food in the Last Year: Never true    Ran Out of Food in the Last Year: Never true  Transportation Needs: No Transportation Needs (12/28/2023)   PRAPARE - Administrator, Civil Service  (Medical): No    Lack of Transportation (Non-Medical): No  Physical Activity: Inactive (12/28/2023)   Exercise Vital Sign    Days of Exercise per Week: 0 days    Minutes of Exercise per Session: 0 min  Stress: No Stress Concern Present (12/28/2023)   [employer] of Occupational Health - Occupational Stress Questionnaire    Feeling of Stress : Not at all  Social Connections: Moderately Integrated (12/28/2023)   Social Connection and Isolation Panel    Frequency of Communication with Friends and Family: More than three times a week    Frequency of Social Gatherings with Friends and Family: More than three times a week    Attends Religious Services: More than 4 times per year    Active Member of [employer] or Organizations: Yes  Attends Banker Meetings: More than 4 times per year    Marital Status: Widowed  Depression (PHQ2-9): Low Risk (12/28/2023)   Depression (PHQ2-9)    PHQ-2 Score: 0  Alcohol  Screen: Low Risk (12/28/2023)   Alcohol  Screen    Last Alcohol  Screening Score (AUDIT): 3  Housing: Unknown (12/28/2023)   Housing Stability Vital Sign    Unable to Pay for Housing in the Last Year: No    Number of Times Moved in the Last Year: Not on file    Homeless in the Last Year: No  Utilities: Not At Risk (12/28/2023)   AHC Utilities    Threatened with loss of utilities: No  Health Literacy: Adequate Health Literacy (12/28/2023)   B1300 Health Literacy    Frequency of need for help with medical instructions: Never   Today's Vitals   12/05/24 0803  BP: 122/78  Pulse: 65  Resp: 16  Temp: 97.9 F (36.6 C)  SpO2: 95%  Weight: 134 lb 12.8 oz (61.1 kg)  Height: 5' 3.5 (1.613 m)   Wt Readings from Last 3 Encounters:  12/05/24 134 lb 12.8 oz (61.1 kg)  12/03/24 143 lb (64.9 kg)  10/05/24 141 lb 12.8 oz (64.3 kg)   Body mass index is 23.5 kg/m.  Physical Exam Vitals and nursing note reviewed.  Constitutional:      General: She is not in acute distress.     Appearance: She is well-developed.  HENT:     Head: Normocephalic and atraumatic.     Mouth/Throat:     Mouth: Mucous membranes are moist.     Pharynx: Oropharynx is clear.  Eyes:     Conjunctiva/sclera: Conjunctivae normal.  Cardiovascular:     Rate and Rhythm: Normal rate and regular rhythm.     Pulses:          Dorsalis pedis pulses are 2+ on the right side and 2+ on the left side.     Heart sounds: No murmur heard. Pulmonary:     Effort: Pulmonary effort is normal. No respiratory distress.     Breath sounds: Normal breath sounds.  Abdominal:     Palpations: Abdomen is soft. There is no hepatomegaly or mass.     Tenderness: There is no abdominal tenderness.  Lymphadenopathy:     Cervical: No cervical adenopathy.  Skin:    General: Skin is warm.     Findings: No erythema or rash.  Neurological:     General: No focal deficit present.     Mental Status: She is alert and oriented to person, place, and time.     Cranial Nerves: No cranial nerve deficit.     Gait: Gait normal.  Psychiatric:        Mood and Affect: Mood and affect normal.   ASSESSMENT AND PLAN:  Ms. Kimela Malstrom was seen today for medical management of chronic issues.  Diagnoses and all orders for this visit: Orders Placed This Encounter  Procedures   Basic metabolic panel with GFR   CBC   Lab Results  Component Value Date   WBC 6.0 12/05/2024   HGB 12.5 12/05/2024   HCT 38.6 12/05/2024   MCV 82.7 12/05/2024   PLT 273.0 12/05/2024   Lab Results  Component Value Date   NA 137 12/05/2024   CL 101 12/05/2024   K 3.6 12/05/2024   CO2 27 12/05/2024   BUN 34 (H) 12/05/2024   CREATININE 1.21 (H) 12/05/2024   GFR 41.02 (L) 12/05/2024  CALCIUM 10.5 12/05/2024   ALBUMIN 4.1 01/06/2024   GLUCOSE 97 12/05/2024   Hypertension with heart disease Assessment & Plan: BP is adequately controlled. Continue amlodipine  5 mg daily, irbesartan  300 mg daily, metoprolol  succinate 50 mg daily, and  triamterene -HCTZ 37.5-25 mg 1/2 tablet daily. Continue monitoring BP regularly. Continue low-salt diet. She has an appointment with her cardiologist in 01/2025.  Orders: -     Basic metabolic panel with GFR; Future -     CBC; Future  Dizziness Assessment & Plan: Possible Mnire's disease. Problem has improved. She recently saw her ENT. Continue triamterene -HCTZ 37.5-25 mg 1/2 tablet daily. Fall precaution discussed.  Nausea without vomiting This seems to be a new problem. We discussed possible etiologies. ?  Dyspepsia, for now she does not want to add a new medication. Recommend small bites of food, ideally protein, every 3 hours or so. Monitor for new symptoms.  -     Basic metabolic panel with GFR; Future -     CBC; Future  Weight loss, non-intentional Most likely related to recent dietary changes, she has decreased oral intake due to changes in appetite and has decreased sugar. We discussed the possibility of a more serious process given her age. Continue adequate protein intake. We will continue monitoring it. Further recommendations will be given according to lab results.  Return in about 6 months (around 06/04/2025) for chronic problems.  Konstantin Lehnen, MD [facility]. [place] office.      [1]  Current Outpatient Medications on File Prior to Visit  Medication Sig Dispense Refill   amLODipine  (NORVASC ) 5 MG tablet Take 1 tablet (5 mg total) by mouth daily. 90 tablet 3   aspirin  81 MG EC tablet Take 81 mg by mouth daily.     clopidogrel  (PLAVIX ) 75 MG tablet Take 1 tablet (75 mg total) by mouth daily. 90 tablet 3   irbesartan  (AVAPRO ) 300 MG tablet Take 1 tablet (300 mg total) by mouth daily. 30 tablet 1   metoprolol  succinate (TOPROL -XL) 50 MG 24 hr tablet Take 1 tablet (50 mg total) by mouth daily. Take with or immediately following a meal. 90 tablet 3   nitroGLYCERIN  (NITROSTAT ) 0.4 MG SL tablet Place 1 tablet (0.4 mg total) under the tongue every 5  (five) minutes as needed for chest pain. 75 tablet 1   ondansetron  (ZOFRAN -ODT) 4 MG disintegrating tablet Take 1 tablet (4 mg total) by mouth 2 (two) times daily as needed for nausea or vomiting. 20 tablet 1   promethazine  (PHENERGAN ) 12.5 MG suppository Place 1 suppository (12.5 mg total) rectally every 12 (twelve) hours as needed for nausea or vomiting. 30 each 0   triamterene -hydrochlorothiazide  (MAXZIDE-25) 37.5-25 MG tablet Take 1 tablet by mouth daily. (Patient taking differently: Take 0.5 tablets by mouth daily.) 30 tablet 2   Cholecalciferol (VITAMIN D3) 125 MCG (5000 UT) CAPS Take 5,000 Units by mouth daily. (Patient not taking: Reported on 12/05/2024)     No current facility-administered medications on file prior to visit.  [2]  Allergies Allergen Reactions   Iodine Shortness Of Breath and Palpitations    Has had some SHOB and rapid heart rate in the past   Latex Other (See Comments)    discomfort   Other Other (See Comments)    Some foods b/c of preservatives    Statins Other (See Comments)    Intolerance to high dosages, myalgia   "

## 2024-12-05 NOTE — Telephone Encounter (Signed)
 Patient informed and voiced understanding of her lab results. Patient did not have any questions or concerns at this time.        Copied from CRM [id number]. Topic: Clinical - Lab/Test Results >> Dec 05, 2024  2:17 PM Carlyon D wrote: Reason for CRM: Pt called to speak to miss Amit Leece who left pt a vm called [facility] call dropped on [facility] end clicked over pt was not answering me. Please reach out to pt in regards to results

## 2024-12-05 NOTE — Telephone Encounter (Signed)
 Copied from CRM [id number]. Topic: Clinical - Medication Question >> Dec 05, 2024  2:56 PM Deleta RAMAN wrote: Reason for CRM: patient would like to know if she would rake irbesartan  (AVAPRO ) 300 MG tablet without the tb 12.5 mg or wih was giving the one with the tb. Please call the patient to clarify

## 2024-12-05 NOTE — Assessment & Plan Note (Signed)
 Possible Mnire's disease. Problem has improved. She recently saw her ENT. Continue triamterene -HCTZ 37.5-25 mg 1/2 tablet daily. Fall precaution discussed.

## 2024-12-05 NOTE — Telephone Encounter (Signed)
 Spoke with the patient and explained that she should NOT be taking triamterene -hydrochlorothiazide  or Irbesartan -hydrochlorothiazide . Patient voiced understanding and does not have any other questions at this time.

## 2024-12-05 NOTE — Patient Instructions (Addendum)
 Erika Brown

## 2024-12-05 NOTE — Telephone Encounter (Signed)
 Patient informed and voiced understanding of her lab results. Patient did not have any questions or concerns at this time.

## 2024-12-05 NOTE — Telephone Encounter (Signed)
 [facility]

## 2024-12-05 NOTE — Assessment & Plan Note (Signed)
 BP is adequately controlled. Continue amlodipine  5 mg daily, irbesartan  300 mg daily, metoprolol  succinate 50 mg daily, and triamterene -HCTZ 37.5-25 mg 1/2 tablet daily. Continue monitoring BP regularly. Continue low-salt diet. She has an appointment with her cardiologist in 01/2025.

## 2024-12-27 ENCOUNTER — Ambulatory Visit: Payer: Self-pay

## 2024-12-27 NOTE — Telephone Encounter (Signed)
 FYI Only or Action Required?: FYI only for provider: appointment scheduled on 1/30.  Patient was last seen in primary care on 12/05/2024 by Jordan, Betty G, MD.  Called Nurse Triage reporting Hypertension.  Symptoms began today.  Interventions attempted: Nothing.  Symptoms are: stable.  Triage Disposition: See PCP When Office is Open (Within 3 Days)  Patient/caregiver understands and will follow disposition?: Yes  Reason for Triage: Pt is having high bp 169/88 not in the 200 ranges   Reason for Disposition  Systolic BP >= 160 OR Diastolic >= 100  Answer Assessment - Initial Assessment Questions 1. BLOOD PRESSURE: What is your blood pressure? Did you take at least two measurements 5 minutes apart?     169/88 2. ONSET: When did you take your blood pressure?     This AM  4. HISTORY: Do you have a history of high blood pressure?     yes 5. MEDICINES: Are you taking any medicines for blood pressure? Have you missed any doses recently?     Yes, not missed any doses. 6. OTHER SYMPTOMS: Do you have any symptoms? (e.g., blurred vision, chest pain, difficulty breathing, headache, weakness)     Denies any symptoms  Protocols used: Blood Pressure - High-A-AH

## 2024-12-27 NOTE — Telephone Encounter (Signed)
 Appt 1/30

## 2024-12-28 ENCOUNTER — Ambulatory Visit: Admitting: Family Medicine

## 2025-01-07 ENCOUNTER — Encounter: Admitting: Family Medicine

## 2025-01-07 ENCOUNTER — Encounter: Payer: HMO | Admitting: Family Medicine

## 2025-06-03 ENCOUNTER — Ambulatory Visit (INDEPENDENT_AMBULATORY_CARE_PROVIDER_SITE_OTHER): Admitting: Otolaryngology
